# Patient Record
Sex: Male | Born: 1965 | Race: Asian | Hispanic: No | Marital: Single | Smoking: Never smoker
Health system: Southern US, Community
[De-identification: ages and names within clinical notes are randomized; demographics above are authoritative.]

## PROBLEM LIST (undated history)

## (undated) ENCOUNTER — Emergency Department (HOSPITAL_COMMUNITY): Payer: Self-pay

## (undated) DIAGNOSIS — I10 Essential (primary) hypertension: Secondary | ICD-10-CM

## (undated) DIAGNOSIS — E119 Type 2 diabetes mellitus without complications: Secondary | ICD-10-CM

---

## 2001-03-16 ENCOUNTER — Encounter: Admission: RE | Admit: 2001-03-16 | Discharge: 2001-03-16 | Payer: Self-pay | Admitting: Sports Medicine

## 2001-04-06 ENCOUNTER — Encounter: Admission: RE | Admit: 2001-04-06 | Discharge: 2001-04-06 | Payer: Self-pay | Admitting: Sports Medicine

## 2001-05-05 ENCOUNTER — Encounter: Admission: RE | Admit: 2001-05-05 | Discharge: 2001-05-05 | Payer: Self-pay | Admitting: Family Medicine

## 2001-05-28 ENCOUNTER — Encounter: Admission: RE | Admit: 2001-05-28 | Discharge: 2001-05-28 | Payer: Self-pay | Admitting: Family Medicine

## 2001-06-01 ENCOUNTER — Emergency Department (HOSPITAL_COMMUNITY): Admission: EM | Admit: 2001-06-01 | Discharge: 2001-06-01 | Payer: Self-pay | Admitting: Emergency Medicine

## 2001-06-11 ENCOUNTER — Encounter: Admission: RE | Admit: 2001-06-11 | Discharge: 2001-06-11 | Payer: Self-pay | Admitting: Family Medicine

## 2014-06-25 ENCOUNTER — Encounter (HOSPITAL_COMMUNITY): Payer: Self-pay | Admitting: Emergency Medicine

## 2014-06-25 ENCOUNTER — Emergency Department (HOSPITAL_COMMUNITY): Payer: No Typology Code available for payment source

## 2014-06-25 ENCOUNTER — Emergency Department (HOSPITAL_COMMUNITY)
Admission: EM | Admit: 2014-06-25 | Discharge: 2014-06-25 | Disposition: A | Discharge status: Home / Self Care | Payer: No Typology Code available for payment source | Attending: Emergency Medicine | Admitting: Emergency Medicine

## 2014-06-25 DIAGNOSIS — S0083XA Contusion of other part of head, initial encounter: Secondary | ICD-10-CM | POA: Insufficient documentation

## 2014-06-25 DIAGNOSIS — S00511A Abrasion of lip, initial encounter: Secondary | ICD-10-CM | POA: Diagnosis not present

## 2014-06-25 DIAGNOSIS — Y9389 Activity, other specified: Secondary | ICD-10-CM | POA: Diagnosis not present

## 2014-06-25 DIAGNOSIS — S0990XA Unspecified injury of head, initial encounter: Secondary | ICD-10-CM | POA: Diagnosis present

## 2014-06-25 DIAGNOSIS — Y92410 Unspecified street and highway as the place of occurrence of the external cause: Secondary | ICD-10-CM | POA: Insufficient documentation

## 2014-06-25 DIAGNOSIS — R0789 Other chest pain: Secondary | ICD-10-CM

## 2014-06-25 DIAGNOSIS — S0093XA Contusion of unspecified part of head, initial encounter: Secondary | ICD-10-CM

## 2014-06-25 MED ORDER — ACETAMINOPHEN 500 MG PO TABS
1000.0000 mg | ORAL_TABLET | Freq: Once | ORAL | Status: AC
  Administered 2014-06-25: 1000 mg via ORAL
  Filled 2014-06-25: qty 2
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
## 2014-06-25 NOTE — Discharge Instructions (Signed)
Return to the emergency room with worsening of symptoms, new symptoms or with symptoms that are concerning, especially severe worsening of headache, visual or speech changes, weakness in face, arms or legs. RICE: Rest, Ice (three cycles of 20 mins on, off at least twice a day), compression/brace, elevation. Heating pad works well for back pain. Ibuprofen 400mg  (2 tablets 200mg ) every 5-6 hours for 3-5 days and then as needed for pain. Follow up with PCP. Please call your doctor for a followup appointment within 24-48 hours. When you talk to your doctor please let them know that you were seen in the emergency department and have them acquire all of your records so that they can discuss the findings with you and formulate a treatment plan to fully care for your new and ongoing problems. If you do not have a primary care provider please call the number below under ED resources to establish care with a provider and follow up.   Emergency Department Resource Guide 1) Find a Doctor and Pay Out of Pocket Although you won't have to find out who is covered by your insurance plan, it is a good idea to ask around and get recommendations. You will then need to call the office and see if the doctor you have chosen will accept you as a new patient and what types of options they offer for patients who are self-pay. Some doctors offer discounts or will set up payment plans for their patients who do not have insurance, but you will need to ask so you aren't surprised when you get to your appointment.  2) Contact Your Local Health Department Not all health departments have doctors that can see patients for sick visits, but many do, so it is worth a call to see if yours does. If you don't know where your local health department is, you can check in your phone book. The CDC also has a tool to help you locate your state's health department, and many state websites also have listings of all of their local health  departments.  3) Find a Walk-in Clinic If your illness is not likely to be very severe or complicated, you may want to try a walk in clinic. These are popping up all over the country in pharmacies, drugstores, and shopping centers. They're usually staffed by nurse practitioners or physician assistants that have been trained to treat common illnesses and complaints. They're usually fairly quick and inexpensive. However, if you have serious medical issues or chronic medical problems, these are probably not your best option.  No Primary Care Doctor: - Call Health Connect at  [phone] - they can help you locate a primary care doctor that  accepts your insurance, provides certain services, etc. - Physician Referral Service- [phone]  Chronic Pain Problems: Organization         Address  Phone   Notes  [phone] [facility]  [phone] Patients need to be referred by their primary care doctor.   Medication Assistance: Organization         Address  Phone   Notes  [facility] Medication [facility] [address] [place] [address] [place], [place] [phone] --Must be a resident of [facility] -- Must have NO insurance coverage whatsoever (no Medicaid/ Medicare, etc.) -- The pt. MUST have a primary care doctor that directs their care regularly and follows them in the community   MedAssist  [phone]   [facility] [address]  ([phone]  Agencies that provide inexpensive medical care: °Organization         Address  Phone   Notes  °[facility]  (336) [phone]   °[facility]    (336) 832-7272   °[facility] [address] °[place], [place] [address] (336) 832-4777   °[facility] [address], °[place] (336) 271-4999   °[facility]    (336) 373-0678   °[facility]    (336) 272-1050   °[facility] ° [address], [place] Phone:  (336)  [phone], Fax:  (336) [phone] Hours of Operation:  9 am - 6 pm, M-F.  Also accepts Medicaid/Medicare and self-pay.  °[facility] ° [address], [place] Phone: (336) [phone], Fax: (336) [phone]. Hours of Operation:  8:30 am - 5:30 pm, M-F.  Also accepts Medicaid and self-pay.  °[facility] [place] [address], [place] Phone: (336) 878-6027   °[facility] [address], [place], [place] (336)723-1848, Ext. 123 Mondays & Thursdays: 7-9 AM.  First 15 patients are seen on a first come, first serve basis. °  ° °Medicaid-accepting [place] Providers: ° °Organization         Address  Phone   Notes  °[facility] [address], [place] ([phone]) [phone] Also accepts self-pay patients.  °[facility] [address], [place] ° (336) [phone]   °[facility] [address], [place] (336) 288-8857   °[facility] [address] [place] [address], [place] (336) 299-7000   °Veita Bland [address], [place]  ° (336) [phone] Only accepts [place] Access Medicaid patients after they have their name applied to their card.  ° °Self-Pay (no insurance) in [place]: ° °Organization         Address  Phone   Notes  °Sickle Cell Patients, [facility] [address], [place] (336) 832-1970   °[facility] [address], [place] (336) 832-4400   °[facility] ° [address], [place] (336) 992-4800   °[facility]/Dr. Osei-Bonsu ° [address] [place] [address], [place] or [address], [place] (336) [phone] Phone number for both [place] and [place] locations is the same.  °[facility] [address], [place] (336) 299-0000   °[facility] [address] [place] [address], [place] or [address] (336)  852-7530 °(336) 878-2260   °[facility] [address], [place] (336) 350-1642, phone; (336) 294-5005, fax Sees patients 1st and 3rd Saturday of every month.  Must not qualify for public or private insurance (i.e. Medicaid, Medicare, [place] Health Choice, Veterans' Benefits) • Household income should be no more than 200% of the poverty level •The clinic cannot treat you if you are pregnant or think you are pregnant • Sexually transmitted diseases are not treated at the clinic.  ° ° °Dental Care: °Organization         Address  Phone  Notes  °[place] [facility] [address], [place] (336) 641-6152 Accepts children up to age 21 who are enrolled in Medicaid or [place] Health Choice; pregnant women with a Medicaid card; and children who have applied for Medicaid or   [place] Health Choice, but were declined, whose parents can pay a reduced fee at time of service.  °[place] [facility] [place]  [address], [place] (336) 641-7733 Accepts children up to age 21 who are enrolled in Medicaid or [place] Health Choice; pregnant women with a Medicaid card; and children who have applied for Medicaid or [place] Health Choice, but were declined, whose parents can pay a reduced fee at time of service.  °[facility] ° [address], [place] (336) 641-4533 Patients are seen by appointment only. Walk-ins are not accepted. [facility] will see patients 18 years of age and older. °Monday - Tuesday (8am-5pm) °Most Wednesdays (8:30-5pm) °$30 per visit, cash only  °[facility] ° [address], [place] (336) 641-4533 Patients are seen by appointment only. Walk-ins are not accepted. [facility] will see patients 18 years of age and older. °One Wednesday Evening (Monthly: Volunteer Based).  $30 per visit, cash only  °[facility]  (919) [phone] for adults;  Children under age 4, call Graduate Pediatric Dentistry at ([phone]) [phone]. Children aged 4-14, please call ([phone]) [phone] to request a pediatric application. ° Dental services are provided in all areas of dental care including fillings, crowns and bridges, complete and partial dentures, implants, gum treatment, root canals, and extractions. Preventive care is also provided. Treatment is provided to both adults and children. °Patients are selected via a lottery and there is often a waiting list. °  °[facility] [address], °[place] ° (336) 763-8833 [email or web address] °  °[facility] [address], [place], [place] (336)723-1848, Ext. 123 Second and Fourth Thursday of each month, opens at 6:30 AM; Clinic ends at 9 AM.  Patients are seen on a first-come first-served basis, and a limited number are seen during each clinic.  ° °[facility] ° [address], [place], [place] (336) 723-7904   Eligibility Requirements °You must have lived in [place], [place], or [place] counties for at least the last three months. °  You cannot be eligible for state or federal sponsored healthcare insurance, including Veterans Administration, Medicaid, or Medicare. °  You generally cannot be eligible for healthcare insurance through your employer.  °  How to apply: °Eligibility screenings are held every Tuesday and Wednesday afternoon from 1:00 pm until 4:00 pm. You do not need an appointment for the interview!  °[facility] [address], [place], [place] 336-631-2330   °[facility]  336-342-8273   °[facility]  336-703-3100   °[facility]  336-570-6415   ° °Behavioral Health Resources in the Community: °Intensive Outpatient Programs °Organization         Address  Phone  Notes  °[facility] [address], [place], [place] 336-878-6098   °[facility] [address], [place], [place] 336-832-9800   °[facility] [address], [place], [place] ° 336-882-2125   °[place] [address],  °[place], [place] 1-800-853-5163 or 336-641-4981   °Substance Abuse Resources °Organization         Address  Phone  Notes  °[facility]  336-882-2125   °[facility]  336-784-9470   °[facility]  336-285-9073   °[facility]  336-845-3988   °Residential & Outpatient Substance Abuse Program  1-800-659-3381   °  Psychological Services Organization         Address  Phone  Notes  [facility]  206-450-5763   [facility]  303-686-9053   [facility] 819-482-0886 [address] [place] 386-545-6930 or (217)634-3649    Mobile Crisis Teams Organization         Address  Phone  Notes  [facility], Mobile Crisis Care Unit  409-634-6339   [facility]  [address] [place], [place] 301-314-3888   Doristine Locks [address] [place] 757-972-8206    Self-Help/Support Groups Organization         Address  Phone             Notes  [facility]oc. of  - variety of support groups  336- I7437963 Call for more information  Narcotics Anonymous (NA), [facility] [address], [employer] [place]  2 meetings at this location   Statistician         Address  Phone  Notes  [facility] [address],    [place]  0-156-153-7943   [facility]  [address], [place] [address], [place], [place] [phone]   [facility] [address] [place], [place] 037-096-4383 Admissions: 8am-3pm M-F  [facility] [address],    [place], [place] 818-403-7543   [facility] [address] [place], [place], [place] 606-770-3403   [facility] [address],  [place], [place] 524-818-5909   [facility] - Intensive  Outpatient [address], [place], [place] 311-216-2446   [facility] ([facility] [address]  [place], [place] 9-507-225-7505 or 970-002-3885   [facility] ([facility] [address], [place], [place] 984-210-3128 Accepts Medicaid  [facility] [address]  [place] 1-188-677-3736 Substance Abuse/Addiction Treatment   [facility] Organization         Address  Phone  Notes  [facility]  (671)713-2782   Angie Fava, PhD [address] [place], [place]   727-490-0570 or 343-375-0553   [facility] Behavioral   [address] [place], [place] 707-297-5804   [facility] [address] [place], [place] [phone] Insurance/Medicaid/sponsorship through [facility] [address]                                    [place], [place] (563)[phone] Therapy/tele-psych/case  [facility] [address], [place] (343)171-6307    Dr. Lolly Mustache  229-093-1630   [facility]  [employer] [facility] Dept. 1) [address], [place] 2) [address] [place] 3)  [address], [place] [phone]  (249) [phone]   [facility] Child Abuse Hotline (431)748-4325 or (818)029-9579 (After Hours)
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
## 2014-06-27 NOTE — ED Provider Notes (Signed)
Medical screening examination/treatment/procedure(s) were performed by non-physician practitioner and as supervising physician I was immediately available for consultation/collaboration.   EKG Interpretation None        Gwyneth Sprout, MD 06/27/14 2011

## 2014-07-03 ENCOUNTER — Other Ambulatory Visit: Payer: Self-pay

## 2014-07-03 ENCOUNTER — Encounter (HOSPITAL_COMMUNITY): Payer: Self-pay | Admitting: Emergency Medicine

## 2014-07-03 ENCOUNTER — Emergency Department (HOSPITAL_COMMUNITY)
Admission: EM | Admit: 2014-07-03 | Discharge: 2014-07-03 | Disposition: A | Discharge status: Home / Self Care | Payer: No Typology Code available for payment source | Attending: Emergency Medicine | Admitting: Emergency Medicine

## 2014-07-03 ENCOUNTER — Emergency Department (HOSPITAL_COMMUNITY): Payer: No Typology Code available for payment source

## 2014-07-03 DIAGNOSIS — S0990XA Unspecified injury of head, initial encounter: Secondary | ICD-10-CM | POA: Diagnosis present

## 2014-07-03 DIAGNOSIS — S0083XA Contusion of other part of head, initial encounter: Secondary | ICD-10-CM | POA: Insufficient documentation

## 2014-07-03 DIAGNOSIS — Y9389 Activity, other specified: Secondary | ICD-10-CM | POA: Insufficient documentation

## 2014-07-03 DIAGNOSIS — R42 Dizziness and giddiness: Secondary | ICD-10-CM | POA: Insufficient documentation

## 2014-07-03 DIAGNOSIS — Y92410 Unspecified street and highway as the place of occurrence of the external cause: Secondary | ICD-10-CM | POA: Diagnosis not present

## 2014-07-03 DIAGNOSIS — R519 Headache, unspecified: Secondary | ICD-10-CM

## 2014-07-03 DIAGNOSIS — R51 Headache: Secondary | ICD-10-CM

## 2014-07-03 LAB — CBC WITH DIFFERENTIAL/PLATELET
BASOS ABS: 0 10*3/uL (ref 0.0–0.1)
Basophils Relative: 0 % (ref 0–1)
EOS PCT: 3 % (ref 0–5)
Eosinophils Absolute: 0.3 10*3/uL (ref 0.0–0.7)
HCT: 41.5 % (ref 39.0–52.0)
Hemoglobin: 14.1 g/dL (ref 13.0–17.0)
Lymphocytes Relative: 39 % (ref 12–46)
Lymphs Abs: 3 10*3/uL (ref 0.7–4.0)
MCH: 25.6 pg — AB (ref 26.0–34.0)
MCHC: 34 g/dL (ref 30.0–36.0)
MCV: 75.3 fL — AB (ref 78.0–100.0)
MONO ABS: 0.8 10*3/uL (ref 0.1–1.0)
Monocytes Relative: 11 % (ref 3–12)
Neutro Abs: 3.5 10*3/uL (ref 1.7–7.7)
Neutrophils Relative %: 47 % (ref 43–77)
Platelets: 237 10*3/uL (ref 150–400)
RBC: 5.51 MIL/uL (ref 4.22–5.81)
RDW: 12.9 % (ref 11.5–15.5)
WBC: 7.6 10*3/uL (ref 4.0–10.5)

## 2014-07-03 LAB — BASIC METABOLIC PANEL
ANION GAP: 14 (ref 5–15)
BUN: 10 mg/dL (ref 6–23)
CO2: 26 mEq/L (ref 19–32)
CREATININE: 0.82 mg/dL (ref 0.50–1.35)
Calcium: 9.4 mg/dL (ref 8.4–10.5)
Chloride: 100 mEq/L (ref 96–112)
GFR calc Af Amer: >90 mL/min (ref >90)
Glucose, Bld: 92 mg/dL (ref 70–99)
Potassium: 4 mEq/L (ref 3.7–5.3)
SODIUM: 140 meq/L (ref 137–147)

## 2014-07-03 LAB — I-STAT TROPONIN, ED: TROPONIN I, POC: 0 ng/mL (ref 0.00–0.08)

## 2014-07-03 MED ORDER — HYDROCODONE-ACETAMINOPHEN 5-325 MG PO TABS: 1.0000 | ORAL_TABLET | ORAL | Status: DC | PRN

## 2014-07-03 MED ORDER — MECLIZINE HCL 25 MG PO TABS: 25.0000 mg | ORAL_TABLET | Freq: Three times a day (TID) | ORAL | Status: DC | PRN

## 2014-07-03 MED ORDER — MECLIZINE HCL 25 MG PO TABS
25.0000 mg | ORAL_TABLET | Freq: Once | ORAL | Status: AC
  Administered 2014-07-03: 25 mg via ORAL
  Filled 2014-07-03: qty 1

## 2014-07-03 MED ORDER — MECLIZINE HCL 50 MG PO TABS: 50.0000 mg | ORAL_TABLET | Freq: Three times a day (TID) | ORAL | Status: DC | PRN
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
## 2014-07-03 NOTE — Discharge Instructions (Signed)
Take the prescribed medication as directed. °[facility]. °Return to the ED for new or worsening symptoms. ° °

## 2014-07-03 NOTE — ED Provider Notes (Signed)
Medical screening examination/treatment/procedure(s) were performed by non-physician practitioner and as supervising physician I was immediately available for consultation/collaboration.   EKG Interpretation None       Arby Barrette, MD 07/03/14 [phone]

## 2020-03-15 ENCOUNTER — Ambulatory Visit: Payer: Self-pay | Attending: Internal Medicine

## 2020-03-15 DIAGNOSIS — Z23 Encounter for immunization: Secondary | ICD-10-CM

## 2020-03-15 NOTE — Progress Notes (Signed)
   Covid-19 Vaccination Clinic  Name:  Wayne Stokes    MRN: [id number] DOB: 03-Apr-1966  03/15/2020  [name] was observed post Covid-19 immunization for 15 minutes without incident. He was provided with Vaccine Information Sheet and instruction to access the V-Safe system.   [name] was instructed to call 911 with any severe reactions post vaccine: Marland Kitchen Difficulty breathing  . Swelling of face and throat  . A fast heartbeat  . A bad rash all over body  . Dizziness and weakness   Immunizations Administered    Name Date Dose VIS Date Route   Pfizer COVID-19 Vaccine 03/15/2020 10:45 AM 0.3 mL 11/02/2018 Intramuscular   Manufacturer: [employer], [employer]   Lot: [id number]   NDC: [address]-6

## 2020-04-07 ENCOUNTER — Other Ambulatory Visit: Payer: Self-pay

## 2020-04-07 ENCOUNTER — Ambulatory Visit: Payer: Self-pay | Attending: Internal Medicine

## 2020-04-07 DIAGNOSIS — Z23 Encounter for immunization: Secondary | ICD-10-CM

## 2020-04-07 NOTE — Progress Notes (Signed)
   Covid-19 Vaccination Clinic  Name:  Wayne Stokes    MRN: [id number] DOB: 06-Jun-1966  04/07/2020  [name] was observed post Covid-19 immunization for 30 minutes based on pre-vaccination screening without incident. He was provided with Vaccine Information Sheet and instruction to access the V-Safe system.   [name] was instructed to call 911 with any severe reactions post vaccine: Marland Kitchen Difficulty breathing  . Swelling of face and throat  . A fast heartbeat  . A bad rash all over body  . Dizziness and weakness   Immunizations Administered    Name Date Dose VIS Date Route   Pfizer COVID-19 Vaccine 04/07/2020  9:23 AM 0.3 mL 11/02/2018 Intramuscular   Manufacturer: [employer], [employer]   Lot: [id number]   NDC: [address]-2

## 2021-01-07 ENCOUNTER — Other Ambulatory Visit: Payer: Self-pay | Admitting: Physical Medicine and Rehabilitation

## 2021-01-07 DIAGNOSIS — S39012A Strain of muscle, fascia and tendon of lower back, initial encounter: Secondary | ICD-10-CM

## 2021-01-10 ENCOUNTER — Other Ambulatory Visit: Payer: Self-pay

## 2021-03-06 NOTE — Congregational Nurse Program (Signed)
CN office visit.  He was brought by a friend with whom he is currently living.  Patient has been unemployed with no income or benefits for about 6 months.  Immediate request was for food assistance.  Patient was referred to [place] for emergency assistance and was helped to complete a FNS application.  CN accompanied them to food pantry to assure urgent need was met.  [name] RN, Congregational Nurse [phone]

## 2021-05-22 NOTE — Congregational Nurse Program (Signed)
CN office visit with interpreter Diu Hartshorn assisting.  Assisted patient in applying for Medicaid and referred him to [name] ([employer]) for help with Disability denial.  They will refer him to Legal Aid.  Told him to return to CN office when he gets letter from DSS regarding Medicaid application so we can help him follow through with their requests.  Brantley Fling RN, Congregational Nurse [phone]

## 2021-06-19 NOTE — Congregational Nurse Program (Signed)
CN office visit with interpreter Diu Hartshorn assisting.  Patient received letter from DSS stating his Medicaid application had been withdrawn.  Will pursue [employer].  [employer] Intern Nia Jones helped patient with phone bill and was able to lower bill significantly.  Brantley Fling RN, Congregational Nurse [phone]

## 2021-06-26 NOTE — Congregational Nurse Program (Signed)
CN office visit with interpreter Diu Hartshorn assisting.  Completed Orange card application and will obtain needed documents next week.  [employer] intern Nia Jones helped him with phone bill and establishing new affordable plan.  CN gave patient a flu shot.  Brantley Fling RN, Congregational Nurse [phone]

## 2021-07-10 NOTE — Congregational Nurse Program (Signed)
Home visit with interpreter Diu Hartshorn to help patient obtain remaining documents to apply for orange card.  We met him at his bank and got most 3 most recent bank statements and got a copy of his 2021 tax return.  He was told he needs a copy of his food stamp award letter and a notarized letter of support from the person who is providing him a place to live.  Will revisit next week.  [name] RN, Congregational Nurse [phone]

## 2021-07-24 NOTE — Congregational Nurse Program (Signed)
Home visit with interpreter Diu Hartshorn.  Patient was sitting outside the apartment where he has been staying with a [place] couple for 8 months.  Stated they no longer want him there.  He has no income for rent.  Phone call to Horatio Pel at [facility] who will assist patient with emergency housing.  He stated he had difficulty walking yesterday and is using a cane. He did not go to doctor because he has no money and is uninsured.  Explained that he could go to [facility] even though he could not pay.  Gave him address and hours and told him we will help with applying for financial assistance once he receives a bill.  Brantley Fling RN, Congregational Nurse [phone]

## 2021-07-29 NOTE — Congregational Nurse Program (Signed)
Home visit without interpreter. Due to language barrier I asked him to come to my office at [employer] on Wednesday. I did demonstrate 2 ways patient can navigate steps in the apartment where he was placed temporarily by [name].  He is using a cane and says both legs have problems.  Will get more information with interpreter present and plan to refer to [facility] clinic for evaluation.  Brantley Fling RN, Congregational Nurse [phone].

## 2021-11-05 ENCOUNTER — Ambulatory Visit: Payer: Self-pay | Attending: Family Medicine | Admitting: Family Medicine

## 2021-11-05 ENCOUNTER — Other Ambulatory Visit: Payer: Self-pay

## 2021-11-12 ENCOUNTER — Ambulatory Visit: Payer: Self-pay | Admitting: Nurse Practitioner

## 2021-11-12 ENCOUNTER — Other Ambulatory Visit: Payer: Self-pay

## 2021-11-15 ENCOUNTER — Other Ambulatory Visit: Payer: Self-pay

## 2021-11-15 ENCOUNTER — Ambulatory Visit (INDEPENDENT_AMBULATORY_CARE_PROVIDER_SITE_OTHER): Payer: Self-pay | Admitting: Nurse Practitioner

## 2021-11-15 ENCOUNTER — Encounter: Payer: Self-pay | Admitting: Nurse Practitioner

## 2021-11-15 VITALS — BP 176/124 | HR 82 | Temp 97.3°F | Ht 60.0 in | Wt 135.2 lb

## 2021-11-15 DIAGNOSIS — Z0000 Encounter for general adult medical examination without abnormal findings: Secondary | ICD-10-CM

## 2021-11-15 DIAGNOSIS — Z789 Other specified health status: Secondary | ICD-10-CM

## 2021-11-15 DIAGNOSIS — Z599 Problem related to housing and economic circumstances, unspecified: Secondary | ICD-10-CM

## 2021-11-15 DIAGNOSIS — E7849 Other hyperlipidemia: Secondary | ICD-10-CM

## 2021-11-15 DIAGNOSIS — G4489 Other headache syndrome: Secondary | ICD-10-CM

## 2021-11-15 DIAGNOSIS — E119 Type 2 diabetes mellitus without complications: Secondary | ICD-10-CM

## 2021-11-15 DIAGNOSIS — M2559 Pain in other specified joint: Secondary | ICD-10-CM

## 2021-11-15 DIAGNOSIS — I10 Essential (primary) hypertension: Secondary | ICD-10-CM

## 2021-11-15 DIAGNOSIS — R413 Other amnesia: Secondary | ICD-10-CM

## 2021-11-15 LAB — POCT URINALYSIS DIP (CLINITEK)
Blood, UA: NEGATIVE
Glucose, UA: NEGATIVE mg/dL
Ketones, POC UA: NEGATIVE mg/dL
Leukocytes, UA: NEGATIVE
Nitrite, UA: NEGATIVE
POC PROTEIN,UA: 30 — AB
Spec Grav, UA: >=1.03 — AB (ref 1.010–1.025)
Urobilinogen, UA: 0.2 E.U./dL
pH, UA: 5 (ref 5.0–8.0)

## 2021-11-15 LAB — POCT GLYCOSYLATED HEMOGLOBIN (HGB A1C)
HbA1c POC (<> result, manual entry): 9.3 % (ref 4.0–5.6)
HbA1c, POC (controlled diabetic range): 9.3 % — AB (ref 0.0–7.0)
HbA1c, POC (prediabetic range): 9.3 % — AB (ref 5.7–6.4)
Hemoglobin A1C: 9.3 % — AB (ref 4.0–5.6)

## 2021-11-15 LAB — GLUCOSE, POCT (MANUAL RESULT ENTRY): POC Glucose: 205 mg/dl — AB (ref 70–99)

## 2021-11-15 MED ORDER — DICLOFENAC SODIUM 1 % EX GEL
4.0000 g | Freq: Four times a day (QID) | CUTANEOUS | 0 refills | Status: AC | PRN
  Filled 2021-11-15: qty 300, 18d supply, fill #0

## 2021-11-15 MED ORDER — LOSARTAN POTASSIUM-HCTZ 50-12.5 MG PO TABS: 1.0000 | ORAL_TABLET | Freq: Every day | ORAL | 2 refills | Status: DC

## 2021-11-15 MED ORDER — ACETAMINOPHEN ER 650 MG PO TBCR
650.0000 mg | EXTENDED_RELEASE_TABLET | Freq: Three times a day (TID) | ORAL | 0 refills | Status: DC | PRN
  Filled 2021-11-15: qty 30, 10d supply, fill #0

## 2021-11-15 MED ORDER — GLIPIZIDE 5 MG PO TABS
5.0000 mg | ORAL_TABLET | Freq: Two times a day (BID) | ORAL | 3 refills | Status: DC
  Filled 2021-11-15: qty 60, 30d supply, fill #0
  Filled 2021-12-25: qty 60, 30d supply, fill #1
  Filled 2022-02-12: qty 60, 30d supply, fill #2
  Filled 2022-03-13 – 2022-03-14 (×2): qty 60, 30d supply, fill #3

## 2021-11-15 MED ORDER — LOSARTAN POTASSIUM-HCTZ 50-12.5 MG PO TABS
1.0000 | ORAL_TABLET | Freq: Every day | ORAL | 2 refills | Status: DC
  Filled 2021-11-15: qty 90, 90d supply, fill #0

## 2021-11-15 MED ORDER — METFORMIN HCL 500 MG PO TABS
500.0000 mg | ORAL_TABLET | Freq: Two times a day (BID) | ORAL | 3 refills | Status: AC
  Filled 2021-11-15: qty 180, 90d supply, fill #0
  Filled 2022-03-13 – 2022-03-14 (×2): qty 60, 30d supply, fill #1
  Filled 2022-05-15: qty 60, 30d supply, fill #2

## 2021-11-15 MED ORDER — METFORMIN HCL 500 MG PO TABS: 500.0000 mg | ORAL_TABLET | Freq: Two times a day (BID) | ORAL | 3 refills | Status: DC

## 2021-11-15 MED ORDER — GLIPIZIDE 5 MG PO TABS: 5.0000 mg | ORAL_TABLET | Freq: Two times a day (BID) | ORAL | 3 refills | Status: DC

## 2021-11-15 NOTE — Patient Instructions (Signed)
You were seen today in the [facility] for wellness visit . Labs were collected, results will be available via MyChart or, if abnormal, you will be contacted by clinic staff. You were prescribed medications, please take as directed. Please follow up in 1 mth for reevaluation.  ?

## 2021-11-15 NOTE — Progress Notes (Signed)
[place] [place], [place]  [address] Phone:  [phone]   Fax:  [phone] Subjective:   Patient ID: Wayne Stokes, male    DOB: Aug 26, 1966, 56 y.o.   MRN: [id number]  Chief Complaint  Patient presents with   Establish Care    Patient is here today to establish care and to discuss his body pains and stiffness that has been going on for 2 years now.   HPI Wayne Stokes 56 y.o. male with no significant medical history to the [address] to establish care. Refugee from [place], that has been in the [place] since 2002. Last visit with PCP unknown. 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   States that he has had worsening body pain and stiffness for the past year. Also endorses HA x 2 days. Expresses having generalized body pains x 1 yr, most pronounced in the right buttocks. Has been taking OTC medications for pain with moderate pain relief, states that he can not miss a day of medication.  HA located in the frontal portion of head, has been rubbing forehead with lemon with marked improvement in HA. Denies any pain during visit. Denies eating meat, states that the meat causes facial swelling.   Currently struggling to pay rent and caseworker has been working towards keeping him from being homeless. Caseworker also endorses patient having difficulty with memory. Denies any other concerns today.   Denies any fever. Denies any fatigue, chest pain, shortness of breath or dizziness. Denies any blurred vision, numbness or tingling.  Limited HPI/ ROS due to language barrier.  History reviewed. No pertinent past medical history.  History reviewed. No pertinent surgical history.  Family History  Family history unknown: Yes    Social History   Socioeconomic History   Marital status: Single    Spouse name: Not on file   Number of children: Not on file   Years of education: Not on file   Highest education level: Not on file  Occupational History   Not on file   Tobacco Use   Smoking status: Never   Smokeless tobacco: Never  Vaping Use   Vaping Use: Never used  Substance and Sexual Activity   Alcohol use: Never   Drug use: Never   Sexual activity: Not Currently  Other Topics Concern   Not on file  Social History Narrative   Not on file   Social Determinants of Health   Financial Resource Strain: Not on file  Food Insecurity: Not on file  Transportation Needs: Not on file  Physical Activity: Not on file  Stress: Not on file  Social Connections: Not on file  Intimate Partner Violence: Not on file    Outpatient Medications Prior to Visit  Medication Sig Dispense Refill   meclizine (ANTIVERT) 25 MG tablet Take 1 tablet (25 mg total) by mouth 3 (three) times daily as needed. (Patient not taking: Reported on 11/15/2021) 30 tablet 0   HYDROcodone-acetaminophen (NORCO/VICODIN) 5-325 MG per tablet Take 1 tablet by mouth every 4 (four) hours as needed. (Patient not taking: Reported on 11/15/2021) 10 tablet 0   No facility-administered medications prior to visit.    Allergies  Allergen Reactions   Beef-Derived Products Swelling   Pork-Derived Products Swelling   Shellfish Allergy Rash    Review of Systems  Constitutional:  Negative for chills, fever and malaise/fatigue.  HENT: Negative.    Eyes: Negative.   Respiratory:  Negative for cough and shortness of breath.  Cardiovascular:  Negative for chest pain, palpitations and leg swelling.  Gastrointestinal:  Negative for abdominal pain, blood in stool, constipation, diarrhea, nausea and vomiting.  Genitourinary: Negative.   Musculoskeletal:  Positive for joint pain and myalgias.  Skin: Negative.   Neurological:  Positive for headaches. Negative for dizziness, tingling, tremors, sensory change, speech change, focal weakness, seizures, loss of consciousness and weakness.  Psychiatric/Behavioral:  Positive for memory loss. Negative for depression, hallucinations, substance abuse and  suicidal ideas. The patient is not nervous/anxious and does not have insomnia.   All other systems reviewed and are negative.     Objective:    Physical Exam Vitals reviewed.  Constitutional:      General: He is not in acute distress.    Appearance: Normal appearance. He is normal weight.     Comments: Patient using a cane to assist with ambulation   HENT:     Head: Normocephalic.     Right Ear: Tympanic membrane, ear canal and external ear normal. There is no impacted cerumen.     Left Ear: Tympanic membrane, ear canal and external ear normal. There is no impacted cerumen.     Nose: Nose normal. No congestion or rhinorrhea.     Mouth/Throat:     Mouth: Mucous membranes are moist.     Pharynx: Oropharynx is clear. No oropharyngeal exudate or posterior oropharyngeal erythema.  Eyes:     General: No scleral icterus.       Right eye: No discharge.        Left eye: No discharge.     Extraocular Movements: Extraocular movements intact.     Conjunctiva/sclera: Conjunctivae normal.     Pupils: Pupils are equal, round, and reactive to light.  Neck:     Vascular: No carotid bruit.  Cardiovascular:     Rate and Rhythm: Normal rate and regular rhythm.     Pulses: Normal pulses.     Heart sounds: Normal heart sounds.     Comments: No obvious peripheral edema Pulmonary:     Effort: Pulmonary effort is normal.     Breath sounds: Normal breath sounds.  Abdominal:     General: Abdomen is flat. Bowel sounds are normal. There is no distension.     Palpations: Abdomen is soft. There is no mass.     Tenderness: There is no abdominal tenderness. There is no right CVA tenderness, left CVA tenderness, guarding or rebound.     Hernia: No hernia is present.  Musculoskeletal:        General: No swelling, tenderness, deformity or signs of injury. Normal range of motion.     Cervical back: Normal range of motion and neck supple. No rigidity or tenderness.     Right lower leg: No edema.     Left  lower leg: No edema.  Lymphadenopathy:     Cervical: No cervical adenopathy.  Skin:    General: Skin is warm and dry.     Capillary Refill: Capillary refill takes less than 2 seconds.  Neurological:     General: No focal deficit present.     Mental Status: He is alert and oriented to person, place, and time.  Psychiatric:        Mood and Affect: Mood normal.        Behavior: Behavior normal.        Thought Content: Thought content normal.        Judgment: Judgment normal.    BP (!) 176/124 (BP Location: Left  Arm, Cuff Size: Normal)    Pulse 82    Temp (!) 97.3 F (36.3 C)    Ht 5' (1.524 m)    Wt 135 lb 3.2 oz (61.3 kg)    SpO2 98%    BMI 26.40 kg/m  Wt Readings from Last 3 Encounters:  11/15/21 135 lb 3.2 oz (61.3 kg)    Immunization History  Administered Date(s) Administered   Influenza,inj,Quad PF,6+ Mos 06/26/2021   PFIZER(Purple Top)SARS-COV-2 Vaccination 03/15/2020, 04/07/2020    Diabetic Foot Exam - Simple   No data filed     No results found for: TSH Lab Results  Component Value Date   WBC 8.3 11/15/2021   HGB 12.9 (L) 11/15/2021   HCT 40.2 11/15/2021   MCV 77 (L) 11/15/2021   PLT 256 11/15/2021   Lab Results  Component Value Date   NA 132 (L) 11/15/2021   K 5.2 11/15/2021   CO2 22 11/15/2021   GLUCOSE 182 (H) 11/15/2021   BUN 12 11/15/2021   CREATININE 1.21 11/15/2021   BILITOT 0.3 11/15/2021   ALKPHOS 92 11/15/2021   AST 25 11/15/2021   ALT 24 11/15/2021   PROT 6.6 11/15/2021   ALBUMIN 4.0 11/15/2021   CALCIUM 9.1 11/15/2021   ANIONGAP 14 07/03/2014   EGFR 70 11/15/2021   Lab Results  Component Value Date   CHOL 235 (H) 11/15/2021   Lab Results  Component Value Date   HDL 55 11/15/2021   Lab Results  Component Value Date   LDLCALC 154 (H) 11/15/2021   Lab Results  Component Value Date   TRIG 144 11/15/2021   Lab Results  Component Value Date   CHOLHDL 4.3 11/15/2021   Lab Results  Component Value Date   HGBA1C 9.3 (A)  11/15/2021   HGBA1C 9.3 11/15/2021   HGBA1C 9.3 (A) 11/15/2021   HGBA1C 9.3 (A) 11/15/2021       Assessment & Plan:   Problem List Items Addressed This Visit   None Visit Diagnoses     Healthcare maintenance    -  Primary   Relevant Orders   POCT URINALYSIS DIP (CLINITEK) (Completed)   HgB A1c (Completed)   Glucose (CBG) (Completed)   CBC with Differential/Platelet (Completed)   Comprehensive metabolic panel (Completed)   Lipid panel (Completed) Encouraged continued diet and exercise efforts  Encouraged continued compliance with medication     Primary hypertension       Relevant Medications   losartan-hydrochlorothiazide (HYZAAR) 50-12.5 MG tablet   atorvastatin (LIPITOR) 40 MG tablet Educated on cause and overall disease process    Other Relevant Orders   CBC with Differential/Platelet (Completed)   Comprehensive metabolic panel (Completed)   Lipid panel (Completed) Encouraged continued diet and exercise efforts  Encouraged continued compliance with medication  Encouraged to check B/P at home  Given anticipatory guidance     Type 2 diabetes mellitus without complication, without long-term current use of insulin (HCC)       Relevant Medications   glipiZIDE (GLUCOTROL) 5 MG tablet   losartan-hydrochlorothiazide (HYZAAR) 50-12.5 MG tablet   metFORMIN (GLUCOPHAGE) 500 MG tablet   atorvastatin (LIPITOR) 40 MG tablet   Other Relevant Orders   CBC with Differential/Platelet (Completed)   Comprehensive metabolic panel (Completed)   Lipid panel (Completed) Encouraged continued diet and exercise efforts  Encouraged continued compliance with medication  Educated on cause and overall disease process    Memory change       Relevant Orders   Ambulatory referral to  Neurology Referred for evaluation for possible dementia v other causes of memory changes   Pain in other joint       Relevant Medications   diclofenac Sodium (VOLTAREN) 1 % GEL   acetaminophen (TYLENOL 8 HOUR  ARTHRITIS PAIN) 650 MG CR tablet Discussed non pharmacological methods for management of symptoms Informed to take OTC medications as needed    Language barrier       Financial difficulties     Referred to in clinic LCSW for further assistance with access to resources   Other headache syndrome       Relevant Medications   acetaminophen (TYLENOL 8 HOUR ARTHRITIS PAIN) 650 MG CR tablet   Other hyperlipidemia       Relevant Medications   losartan-hydrochlorothiazide (HYZAAR) 50-12.5 MG tablet   atorvastatin (LIPITOR) 40 MG tablet Encouraged continued diet and exercise efforts  Encouraged continued compliance with medication     Follow up in 1 mth for reevaluation of hypertension, DM and other concerns, sooner as needed     I have discontinued Zuri Appleton's HYDROcodone-acetaminophen. I am also having him start on diclofenac Sodium, acetaminophen, and atorvastatin. Additionally, I am having him maintain his meclizine, glipiZIDE, losartan-hydrochlorothiazide, and metFORMIN.  Meds ordered this encounter  Medications   DISCONTD: losartan-hydrochlorothiazide (HYZAAR) 50-12.5 MG tablet    Sig: Take 1 tablet by mouth daily.    Dispense:  30 tablet    Refill:  2   DISCONTD: metFORMIN (GLUCOPHAGE) 500 MG tablet    Sig: Take 1 tablet (500 mg total) by mouth 2 (two) times daily with a meal.    Dispense:  180 tablet    Refill:  3   DISCONTD: glipiZIDE (GLUCOTROL) 5 MG tablet    Sig: Take 1 tablet (5 mg total) by mouth 2 (two) times daily before a meal.    Dispense:  60 tablet    Refill:  3   glipiZIDE (GLUCOTROL) 5 MG tablet    Sig: Take 1 tablet (5 mg total) by mouth 2 (two) times daily before a meal.    Dispense:  60 tablet    Refill:  3   losartan-hydrochlorothiazide (HYZAAR) 50-12.5 MG tablet    Sig: Take 1 tablet by mouth daily.    Dispense:  30 tablet    Refill:  2   metFORMIN (GLUCOPHAGE) 500 MG tablet    Sig: Take 1 tablet (500 mg total) by mouth 2 (two) times daily with a meal.     Dispense:  180 tablet    Refill:  3   diclofenac Sodium (VOLTAREN) 1 % GEL    Sig: Apply 4 g topically 4 (four) times daily as needed for up to 22 days (body pain).    Dispense:  350 g    Refill:  0   acetaminophen (TYLENOL 8 HOUR ARTHRITIS PAIN) 650 MG CR tablet    Sig: Take 1 tablet (650 mg total) by mouth every 8 (eight) hours as needed for pain.    Dispense:  30 tablet    Refill:  0   atorvastatin (LIPITOR) 40 MG tablet    Sig: Take 1 tablet (40 mg total) by mouth daily.    Dispense:  90 tablet    Refill:  3     Teena Dunk, NP

## 2021-11-16 LAB — COMPREHENSIVE METABOLIC PANEL
ALT: 24 IU/L (ref 0–44)
AST: 25 IU/L (ref 0–40)
Albumin/Globulin Ratio: 1.5 (ref 1.2–2.2)
Albumin: 4 g/dL (ref 3.8–4.9)
Alkaline Phosphatase: 92 IU/L (ref 44–121)
BUN/Creatinine Ratio: 10 (ref 9–20)
BUN: 12 mg/dL (ref 6–24)
Bilirubin Total: 0.3 mg/dL (ref 0.0–1.2)
CO2: 22 mmol/L (ref 20–29)
Calcium: 9.1 mg/dL (ref 8.7–10.2)
Chloride: 95 mmol/L — ABNORMAL LOW (ref 96–106)
Creatinine, Ser: 1.21 mg/dL (ref 0.76–1.27)
Globulin, Total: 2.6 g/dL (ref 1.5–4.5)
Glucose: 182 mg/dL — ABNORMAL HIGH (ref 70–99)
Potassium: 5.2 mmol/L (ref 3.5–5.2)
Sodium: 132 mmol/L — ABNORMAL LOW (ref 134–144)
Total Protein: 6.6 g/dL (ref 6.0–8.5)
eGFR: 70 mL/min/{1.73_m2} (ref >59)

## 2021-11-16 LAB — CBC WITH DIFFERENTIAL/PLATELET
Basophils Absolute: 0 10*3/uL (ref 0.0–0.2)
Basos: 0 %
EOS (ABSOLUTE): 0 10*3/uL (ref 0.0–0.4)
Eos: 0 %
Hematocrit: 40.2 % (ref 37.5–51.0)
Hemoglobin: 12.9 g/dL — ABNORMAL LOW (ref 13.0–17.7)
Immature Grans (Abs): 0.1 10*3/uL (ref 0.0–0.1)
Immature Granulocytes: 1 %
Lymphocytes Absolute: 1 10*3/uL (ref 0.7–3.1)
Lymphs: 12 %
MCH: 24.6 pg — ABNORMAL LOW (ref 26.6–33.0)
MCHC: 32.1 g/dL (ref 31.5–35.7)
MCV: 77 fL — ABNORMAL LOW (ref 79–97)
Monocytes Absolute: 0.6 10*3/uL (ref 0.1–0.9)
Monocytes: 8 %
Neutrophils Absolute: 6.5 10*3/uL (ref 1.4–7.0)
Neutrophils: 79 %
Platelets: 256 10*3/uL (ref 150–450)
RBC: 5.25 x10E6/uL (ref 4.14–5.80)
RDW: 15 % (ref 11.6–15.4)
WBC: 8.3 10*3/uL (ref 3.4–10.8)

## 2021-11-16 LAB — LIPID PANEL
Chol/HDL Ratio: 4.3 ratio (ref 0.0–5.0)
Cholesterol, Total: 235 mg/dL — ABNORMAL HIGH (ref 100–199)
HDL: 55 mg/dL (ref >39)
LDL Chol Calc (NIH): 154 mg/dL — ABNORMAL HIGH (ref 0–99)
Triglycerides: 144 mg/dL (ref 0–149)
VLDL Cholesterol Cal: 26 mg/dL (ref 5–40)

## 2021-11-18 ENCOUNTER — Other Ambulatory Visit: Payer: Self-pay

## 2021-11-18 MED ORDER — ATORVASTATIN CALCIUM 40 MG PO TABS
40.0000 mg | ORAL_TABLET | Freq: Every day | ORAL | 3 refills | Status: AC
  Filled 2021-11-18 – 2021-12-19 (×2): qty 90, 90d supply, fill #0
  Filled 2022-03-13 – 2022-03-14 (×2): qty 30, 30d supply, fill #1

## 2021-11-19 ENCOUNTER — Telehealth: Payer: Self-pay | Admitting: Clinical

## 2021-11-19 NOTE — Telephone Encounter (Signed)
[facility] ?Case Management Referral Note ? ?11/19/2021 ?Name: [name]  MRN: [id number] DOB: 05/24/1966 ? [name] is a 56 y.o. year old male who sees [name], Tewana I, NP for primary care. LCSW was consulted to assess patient's needs and assist the patient with Financial Difficulties related to no income and lack of insurance . ? ?Interpreter: No.   Interpreter Name & Language: none ? ?Assessment: Patient experiencing Financial Difficulties related to no income and lack of insurance. ? ?Intervention: CSW called patient's caseworker with [facility] [employer] [facility] [employer]. Xuem also got patient on the phone and assisted with interpretation. Discussed housing and food needs. Patient does not currently have any income. Eusebio MeXuem is assisting him with SSI/D application. [employer] is assisting with rent, but this won't be a long term solution. Patient lives with a roommate. Patient does not have money for food. His food stamps expired. Eusebio MeXuem is meeting with him tomorrow to assist with new food stamps application. Discussed referral to [facility] [employer] [facility] community support and nutrition program. Patient consented to this referral. ? ?Review of patient status, including review of consultants reports, relevant laboratory and other test results, and collaboration with appropriate care team members and the patient's provider was performed as part of comprehensive patient evaluation and provision of services.   ? ?Abigail ButtsSusan Trixy Loyola, LCSW ?[facility] ?[phone] ?  ? ?

## 2021-11-22 ENCOUNTER — Other Ambulatory Visit: Payer: Self-pay

## 2021-11-22 ENCOUNTER — Ambulatory Visit: Payer: Self-pay | Attending: Family Medicine

## 2021-11-25 ENCOUNTER — Other Ambulatory Visit: Payer: Self-pay

## 2021-12-05 ENCOUNTER — Encounter (HOSPITAL_COMMUNITY): Payer: Self-pay | Admitting: *Deleted

## 2021-12-05 ENCOUNTER — Emergency Department (HOSPITAL_COMMUNITY): Payer: Self-pay

## 2021-12-05 ENCOUNTER — Encounter (HOSPITAL_COMMUNITY): Payer: Self-pay | Admitting: Emergency Medicine

## 2021-12-05 ENCOUNTER — Ambulatory Visit (HOSPITAL_COMMUNITY)
Admission: EM | Admit: 2021-12-05 | Discharge: 2021-12-05 | Disposition: A | Discharge status: Home / Self Care | Payer: Self-pay

## 2021-12-05 ENCOUNTER — Ambulatory Visit: Payer: Self-pay | Admitting: *Deleted

## 2021-12-05 ENCOUNTER — Other Ambulatory Visit: Payer: Self-pay

## 2021-12-05 ENCOUNTER — Emergency Department (HOSPITAL_COMMUNITY)
Admission: EM | Admit: 2021-12-05 | Discharge: 2021-12-05 | Disposition: A | Discharge status: Home / Self Care | Payer: Self-pay | Attending: Emergency Medicine | Admitting: Emergency Medicine

## 2021-12-05 DIAGNOSIS — I10 Essential (primary) hypertension: Secondary | ICD-10-CM | POA: Insufficient documentation

## 2021-12-05 DIAGNOSIS — H538 Other visual disturbances: Secondary | ICD-10-CM | POA: Insufficient documentation

## 2021-12-05 DIAGNOSIS — I160 Hypertensive urgency: Secondary | ICD-10-CM

## 2021-12-05 DIAGNOSIS — E119 Type 2 diabetes mellitus without complications: Secondary | ICD-10-CM | POA: Insufficient documentation

## 2021-12-05 DIAGNOSIS — Z7984 Long term (current) use of oral hypoglycemic drugs: Secondary | ICD-10-CM | POA: Insufficient documentation

## 2021-12-05 DIAGNOSIS — Z79899 Other long term (current) drug therapy: Secondary | ICD-10-CM | POA: Insufficient documentation

## 2021-12-05 HISTORY — DX: Essential (primary) hypertension: I10

## 2021-12-05 HISTORY — DX: Type 2 diabetes mellitus without complications: E11.9

## 2021-12-05 LAB — BASIC METABOLIC PANEL
Anion gap: 9 (ref 5–15)
BUN: 17 mg/dL (ref 6–20)
CO2: 25 mmol/L (ref 22–32)
Calcium: 9.1 mg/dL (ref 8.9–10.3)
Chloride: 96 mmol/L — ABNORMAL LOW (ref 98–111)
Creatinine, Ser: 1.27 mg/dL — ABNORMAL HIGH (ref 0.61–1.24)
GFR, Estimated: >60 mL/min (ref >60)
Glucose, Bld: 88 mg/dL (ref 70–99)
Potassium: 4 mmol/L (ref 3.5–5.1)
Sodium: 130 mmol/L — ABNORMAL LOW (ref 135–145)

## 2021-12-05 LAB — CBC WITH DIFFERENTIAL/PLATELET
Abs Immature Granulocytes: 0.21 10*3/uL — ABNORMAL HIGH (ref 0.00–0.07)
Basophils Absolute: 0 10*3/uL (ref 0.0–0.1)
Basophils Relative: 0 %
Eosinophils Absolute: 0.1 10*3/uL (ref 0.0–0.5)
Eosinophils Relative: 0 %
HCT: 32.9 % — ABNORMAL LOW (ref 39.0–52.0)
Hemoglobin: 10.6 g/dL — ABNORMAL LOW (ref 13.0–17.0)
Immature Granulocytes: 2 %
Lymphocytes Relative: 14 %
Lymphs Abs: 1.6 10*3/uL (ref 0.7–4.0)
MCH: 24.5 pg — ABNORMAL LOW (ref 26.0–34.0)
MCHC: 32.2 g/dL (ref 30.0–36.0)
MCV: 76.2 fL — ABNORMAL LOW (ref 80.0–100.0)
Monocytes Absolute: 1 10*3/uL (ref 0.1–1.0)
Monocytes Relative: 9 %
Neutro Abs: 8.9 10*3/uL — ABNORMAL HIGH (ref 1.7–7.7)
Neutrophils Relative %: 75 %
Platelets: 404 10*3/uL — ABNORMAL HIGH (ref 150–400)
RBC: 4.32 MIL/uL (ref 4.22–5.81)
RDW: 14.2 % (ref 11.5–15.5)
WBC: 11.8 10*3/uL — ABNORMAL HIGH (ref 4.0–10.5)
nRBC: 0 % (ref 0.0–0.2)

## 2021-12-05 MED ORDER — AMLODIPINE BESYLATE 5 MG PO TABS
5.0000 mg | ORAL_TABLET | Freq: Once | ORAL | Status: AC
Start: 2021-12-05 — End: 2021-12-05
  Administered 2021-12-05: 5 mg via ORAL
  Filled 2021-12-05: qty 1

## 2021-12-05 MED ORDER — AMLODIPINE BESYLATE 5 MG PO TABS: 5.0000 mg | ORAL_TABLET | Freq: Every day | ORAL | 0 refills | Status: DC

## 2021-12-05 MED ORDER — HYDROCHLOROTHIAZIDE 12.5 MG PO TABS: 12.5000 mg | ORAL_TABLET | Freq: Every day | ORAL | Status: DC

## 2021-12-05 MED ORDER — ACETAMINOPHEN 500 MG PO TABS
1000.0000 mg | ORAL_TABLET | Freq: Once | ORAL | Status: AC
Start: 2021-12-05 — End: 2021-12-05
  Administered 2021-12-05: 1000 mg via ORAL
  Filled 2021-12-05: qty 2

## 2021-12-05 MED ORDER — LOSARTAN POTASSIUM-HCTZ 50-12.5 MG PO TABS: 1.0000 | ORAL_TABLET | Freq: Every day | ORAL | Status: DC

## 2021-12-05 MED ORDER — LOSARTAN POTASSIUM 50 MG PO TABS: 50.0000 mg | ORAL_TABLET | Freq: Every day | ORAL | Status: DC

## 2021-12-05 NOTE — ED Triage Notes (Addendum)
Pt had his BP checked earlier today and BP was high 200/112. Pt was advised to come to [facility] for assessment. Pt reports he has HA because BP is high . Pt reports blurred vision over 2 months off and on.Pt reports he has blurred vision now with heavy feeling on his head. ?

## 2021-12-05 NOTE — ED Provider Triage Note (Signed)
Emergency Medicine Provider Triage Evaluation Note  Wayne Stokes , a 56 y.o. male  was evaluated in triage.  Pt complains of elevated blood pressure. Pt went to urgent care today and they sent him here for further eval. He is complaining of headaches and difficulty standing for long periods of time.   Review of Systems  Positive: As above Negative: CP, SOB, blurry vision  Physical Exam  BP (!) 184/109   Pulse 66   Temp (!) 97.5 F (36.4 C) (Oral)   Resp 18   SpO2 99%  Gen:   Awake, no distress   Resp:  Normal effort  MSK:   Moves extremities without difficulty  Other:    Medical Decision Making  Medically screening exam initiated at 5:48 PM.  Appropriate orders placed.  Wayne Stokes was informed that the remainder of the evaluation will be completed by another provider, this initial triage assessment does not replace that evaluation, and the importance of remaining in the ED until their evaluation is complete.     Kateri Plummer, PA-C 12/05/21 1750

## 2021-12-05 NOTE — ED Notes (Signed)
Patient transported to CT 

## 2021-12-05 NOTE — ED Provider Notes (Signed)
?[facility] ? ? ? ?CSN: [id number] ?Arrival date & time: 12/05/21  1539 ? ? ?  ? ?History   ?Chief Complaint ?Chief Complaint  ?Patient presents with  ? Hypertension  ? ? ?HPI ?Wayne Stokes is a 56 y.o. male.  ? ?Patient presents with 2 close friends who are serving as interpreters; our medical interpreter is do not speak the same dialect as the patient.  Patient reports elevated blood pressure noted for the past few days.  Reports his primary care provider has been trending his blood pressure under control and he is taking his medication as prescribed.  He reports headache and vision changes today.  He denies shortness of breath, chest pain, leg swelling.  He does also report his legs are extremely painful. ? ? ?Past Medical History:  ?Diagnosis Date  ? Diabetes mellitus without complication (HCC)   ? Hypertension   ? ? ?There are no problems to display for this patient. ? ? ?History reviewed. No pertinent surgical history. ? ? ? ? ?Home Medications   ? ?Prior to Admission medications   ?Medication Sig Start Date End Date Taking? Authorizing Provider  ?acetaminophen (TYLENOL 8 HOUR ARTHRITIS PAIN) 650 MG CR tablet Take 1 tablet (650 mg total) by mouth every 8 (eight) hours as needed for pain. 11/15/21   [name], NP  ?atorvastatin (LIPITOR) 40 MG tablet Take 1 tablet (40 mg total) by mouth daily. 11/18/21   [name], NP  ?diclofenac Sodium (VOLTAREN) 1 % GEL Apply 4 g topically 4 (four) times daily as needed for up to 22 days (body pain). 11/15/21 12/07/21  [name], NP  ?glipiZIDE (GLUCOTROL) 5 MG tablet Take 1 tablet (5 mg total) by mouth 2 (two) times daily before a meal. 11/15/21   [name], [name], NP  ?losartan-hydrochlorothiazide (HYZAAR) 50-12.5 MG tablet Take 1 tablet by mouth daily. 11/15/21 02/13/22  [name], NP  ?meclizine (ANTIVERT) 25 MG tablet Take 1 tablet (25 mg total) by mouth 3 (three) times daily as needed. ?Patient not taking: Reported on 11/15/2021  07/03/14   Garlon Hatchet, PA-C  ?metFORMIN (GLUCOPHAGE) 500 MG tablet Take 1 tablet (500 mg total) by mouth 2 (two) times daily with a meal. 11/15/21   [name], NP  ? ? ?Family History ?Family History  ?Family history unknown: Yes  ? ? ?Social History ?Social History  ? ?Tobacco Use  ? Smoking status: Never  ? Smokeless tobacco: Never  ?Vaping Use  ? Vaping Use: Never used  ?Substance Use Topics  ? Alcohol use: Never  ? Drug use: Never  ? ? ? ?Allergies   ?Beef-derived products, Pork-derived products, and Shellfish allergy ? ? ?Review of Systems ?Review of Systems ?Per HPI ? ?Physical Exam ?Triage Vital Signs ?ED Triage Vitals  ?Enc Vitals Group  ?   BP 12/05/21 1642 (!) 196/120  ?   Pulse Rate 12/05/21 1642 66  ?   Resp 12/05/21 1642 18  ?   Temp 12/05/21 1642 98.3 ?F (36.8 ?C)  ?   Temp src --   ?   SpO2 12/05/21 1642 97 %  ?   Weight --   ?   Height --   ?   Head Circumference --   ?   Peak Flow --   ?   Pain Score 12/05/21 1638 0  ?   Pain Loc --   ?   Pain Edu? --   ?   Excl.  in GC? --   ? ?No data found. ? ?Updated Vital Signs ?BP (!) 196/120   Pulse 66   Temp 98.3 ?F (36.8 ?C)   Resp 18   SpO2 97%  ? ?Visual Acuity ?Right Eye Distance:   ?Left Eye Distance:   ?Bilateral Distance:   ? ?Right Eye Near:   ?Left Eye Near:    ?Bilateral Near:    ? ?Physical Exam ?Vitals and nursing note reviewed.  ?Constitutional:   ?   General: He is not in acute distress. ?   Appearance: Normal appearance. He is obese. He is not toxic-appearing.  ?Eyes:  ?   Pupils: Pupils are equal, round, and reactive to light.  ?Cardiovascular:  ?   Rate and Rhythm: Normal rate and regular rhythm.  ?Pulmonary:  ?   Effort: Pulmonary effort is normal. No respiratory distress.  ?   Breath sounds: Normal breath sounds. No wheezing, rhonchi or rales.  ?Skin: ?   General: Skin is warm and dry.  ?   Coloration: Skin is not jaundiced.  ?   Findings: No erythema.  ?Neurological:  ?   Mental Status: He is alert. Mental status is at  baseline.  ?   Gait: Gait normal.  ?Psychiatric:     ?   Mood and Affect: Mood normal.  ? ? ? ?UC Treatments / Results  ?Labs ?(all labs ordered are listed, but only abnormal results are displayed) ?Labs Reviewed - No data to display ? ?EKG ? ? ?Radiology ?No results found. ? ?Procedures ?Procedures (including critical care time) ? ?Medications Ordered in UC ?Medications - No data to display ? ?Initial Impression / Assessment and Plan / UC Course  ?I have reviewed the triage vital signs and the nursing notes. ? ?Pertinent labs & imaging results that were available during my care of the patient were reviewed by me and considered in my medical decision making (see chart for details). ? ?  ?Given significantly elevated blood pressure with headache and vision changes, I recommended the patient be seen in the emergency room emergently for hypertensive urgency.  The patient is in agreement to this plan.  All questions answered. ?Final Clinical Impressions(s) / UC Diagnoses  ? ?Final diagnoses:  ?Hypertensive urgency  ? ? ? ?Discharge Instructions   ? ?  ?- Please go straight to the Emergency for further evaluation and management of your high blood pressure ? ? ? ?ED Prescriptions   ?None ?  ? ?PDMP not reviewed this encounter. ?  ?Valentino Nose, NP ?12/05/21 1704 ? ?

## 2021-12-05 NOTE — Discharge Instructions (Addendum)
You were evaluated in the Emergency Department and after careful evaluation, we did not find any emergent condition requiring admission or further testing in the hospital. ? ?Your exam/testing today was overall reassuring. We were concerned for HTN urgency. Your symptoms of headache and blurry vision improved and you chose outpatient management over admission today. ? ?Please return to the Emergency Department if you experience any worsening of your condition.  Thank you for allowing [place] to be a part of your care.  ?

## 2021-12-05 NOTE — ED Triage Notes (Signed)
Informed Provider of Pt's elevated BP and blurred vision. ?

## 2021-12-05 NOTE — ED Notes (Signed)
Patient is A/O x3.Patient discharged by MD Horton.  Patient given discharge instructions. Patient verbalized understanding of all instructions . Patient denies headache , dizziness or chest pain . Patient discharged in no acute distress.  ?

## 2021-12-05 NOTE — Congregational Nurse Program (Signed)
Home visit with Caseworker Mariane Baumgarten who is assisting patient with applying for disability.  BP is 200/112.  States he has been taking BP medicine as prescribed on 11/15/2021.  Referred patient to [facility] and will follow-up with home visit on 12/11/2021 to recheck BP.  Brantley Fling RN, Congregational Nurse [phone] ?

## 2021-12-05 NOTE — ED Provider Notes (Signed)
?[facility] ?Provider Note ? ? ?CSN: [id number] ?Arrival date & time: 12/05/21  1709 ? ?  ? ?History ? ?Chief Complaint  ?Patient presents with  ? Hypertension  ? ? ?Wayne Stokes is a 56 y.o. male w/ hypertension and diabetes presenting to the ED for elevated blood pressures with associated headache and blurry vision.  Patient reports has had this mild headache and blurry vision since he was prescribed his blood pressure medication on 3/10.  However it did get worse today prompting him to go to urgent care.  At urgent care he was found to have elevated blood pressures.  Daughter reports blood pressures in the 200s prior to arrival.  UC sent him to the ED to be evaluated for hypertension emergency.  Denies any bloody stools, vomiting blood, or dark stools. Patient did not take his HCTZ/losartan today because it did not seem to be helping his blood pressures or symptoms. ? ?Unable to have the dialect of interpreter available.  Daughter at bedside and patient agrees to have her interpret at this time given limited ability of our tele interpreters. ? ? ?Hypertension ?Associated symptoms include headaches. Pertinent negatives include no chest pain, no abdominal pain and no shortness of breath.  ? ?  ? ?Home Medications ?Prior to Admission medications   ?Medication Sig Start Date End Date Taking? Authorizing Provider  ?acetaminophen (TYLENOL 8 HOUR ARTHRITIS PAIN) 650 MG CR tablet Take 1 tablet (650 mg total) by mouth every 8 (eight) hours as needed for pain. 11/15/21  Yes [name], Enid Derryewana I, NP  ?amLODipine (NORVASC) 5 MG tablet Take 1 tablet (5 mg total) by mouth daily for 15 days. 12/05/21 12/20/21 Yes Micheline MazeKiehl, Velicia Dejager, MD  ?diclofenac Sodium (VOLTAREN) 1 % GEL Apply 4 g topically 4 (four) times daily as needed for up to 22 days (body pain). 11/15/21 12/07/21 Yes [name], Enid Derryewana I, NP  ?glipiZIDE (GLUCOTROL) 5 MG tablet Take 1 tablet (5 mg total) by mouth 2 (two) times daily before a meal.  11/15/21  Yes [name], Tewana I, NP  ?losartan-hydrochlorothiazide (HYZAAR) 50-12.5 MG tablet Take 1 tablet by mouth daily. 11/15/21 02/13/22 Yes [name], Enid Derryewana I, NP  ?metFORMIN (GLUCOPHAGE) 500 MG tablet Take 1 tablet (500 mg total) by mouth 2 (two) times daily with a meal. 11/15/21  Yes [name], Enid Derryewana I, NP  ?Omega 3 1000 MG CAPS Take 1,000 mg by mouth daily.   Yes [provider]  ?atorvastatin (LIPITOR) 40 MG tablet Take 1 tablet (40 mg total) by mouth daily. ?Patient not taking: Reported on 12/05/2021 11/18/21   Orion CrookPassmore, Tewana I, NP  ?meclizine (ANTIVERT) 25 MG tablet Take 1 tablet (25 mg total) by mouth 3 (three) times daily as needed. ?Patient not taking: Reported on 11/15/2021 07/03/14   Garlon HatchetSanders, Lisa M, PA-C  ?   ? ?Allergies    ?Beef-derived products, Pork-derived products, Shellfish allergy, Eggs or egg-derived products, and Other   ? ?Review of Systems   ?Review of Systems  ?Constitutional:  Negative for activity change, chills and fever.  ?Eyes:  Positive for visual disturbance.  ?Respiratory:  Negative for chest tightness and shortness of breath.   ?Cardiovascular:  Negative for chest pain.  ?Gastrointestinal:  Negative for abdominal pain, nausea and vomiting.  ?Skin:  Negative for rash and wound.  ?Neurological:  Positive for headaches. Negative for light-headedness.  ? ?Physical Exam ?Updated Vital Signs ?BP (!) 170/107 (BP Location: Left Arm)   Pulse 67   Temp (!) 97.5 ?F (36.4 ?  C) (Oral)   Resp 17   SpO2 98%  ?Physical Exam ?Vitals and nursing note reviewed.  ?Constitutional:   ?   General: He is not in acute distress. ?   Appearance: He is well-developed.  ?HENT:  ?   Head: Normocephalic and atraumatic.  ?Eyes:  ?   Conjunctiva/sclera: Conjunctivae normal.  ?Cardiovascular:  ?   Rate and Rhythm: Normal rate and regular rhythm.  ?   Heart sounds: No murmur heard. ?Pulmonary:  ?   Effort: Pulmonary effort is normal. No respiratory distress.  ?   Breath sounds: Normal breath sounds.   ?Abdominal:  ?   Palpations: Abdomen is soft.  ?   Tenderness: There is no abdominal tenderness.  ?Musculoskeletal:     ?   General: No swelling.  ?   Cervical back: Neck supple.  ?Skin: ?   General: Skin is warm and dry.  ?   Capillary Refill: Capillary refill takes less than 2 seconds.  ?Neurological:  ?   General: No focal deficit present.  ?   Mental Status: He is alert and oriented to person, place, and time.  ?   Cranial Nerves: No cranial nerve deficit.  ?   Sensory: No sensory deficit.  ?   Motor: No weakness.  ?Psychiatric:     ?   Mood and Affect: Mood normal.  ? ? ?ED Results / Procedures / Treatments   ?Labs ?(all labs ordered are listed, but only abnormal results are displayed) ?Labs Reviewed  ?CBC WITH DIFFERENTIAL/PLATELET - Abnormal; Notable for the following components:  ?    Result Value  ? WBC 11.8 (*)   ? Hemoglobin 10.6 (*)   ? HCT 32.9 (*)   ? MCV 76.2 (*)   ? MCH 24.5 (*)   ? Platelets 404 (*)   ? Neutro Abs 8.9 (*)   ? Abs Immature Granulocytes 0.21 (*)   ? All other components within normal limits  ?BASIC METABOLIC PANEL - Abnormal; Notable for the following components:  ? Sodium 130 (*)   ? Chloride 96 (*)   ? Creatinine, Ser 1.27 (*)   ? All other components within normal limits  ? ? ?EKG ?None ? ?Radiology ?CT Head Wo Contrast ? ?Result Date: 12/05/2021 ?CLINICAL DATA:  Headache, new or worsening. EXAM: CT HEAD WITHOUT CONTRAST TECHNIQUE: Contiguous axial images were obtained from the base of the skull through the vertex without intravenous contrast. RADIATION DOSE REDUCTION: This exam was performed according to the departmental dose-optimization program which includes automated exposure control, adjustment of the mA and/or kV according to patient size and/or use of iterative reconstruction technique. COMPARISON:  CT head dated July 03 2014. FINDINGS: Brain: No evidence of acute infarction, hemorrhage, hydrocephalus, extra-axial collection or mass lesion/mass effect. Mega cisterna  magna incidentally noted. Vascular: No hyperdense vessel or unexpected calcification. Skull: Normal. Negative for fracture or focal lesion. Sinuses/Orbits: No acute finding. Other: None. IMPRESSION: No acute intracranial abnormality. Electronically Signed   By: Larose Hires D.O.   On: 12/05/2021 22:39   ? ?Procedures ?Procedures  ?Medications Ordered in ED ?Medications  ?losartan (COZAAR) tablet 50 mg (has no administration in time range)  ?  And  ?hydrochlorothiazide (HYDRODIURIL) tablet 12.5 mg (has no administration in time range)  ?amLODipine (NORVASC) tablet 5 mg (5 mg Oral Given 12/05/21 2052)  ?acetaminophen (TYLENOL) tablet 1,000 mg (1,000 mg Oral Given 12/05/21 2052)  ? ? ?ED Course/ Medical Decision Making/ A&P ?Clinical Course as of 12/06/21 0025  ?  Thu Dec 05, 2021  ?2256 CT head with no acute abnormalities.  [RK]  ?  ?Clinical Course User Index ?[RK] Micheline Maze, MD  ? ?                        ?Medical Decision Making ?Amount and/or Complexity of Data Reviewed ?Radiology: ordered. ? ?Risk ?OTC drugs. ?Prescription drug management. ? ? ?56 year old male who on chart review has history of hypertension who is on HCTZ losartan and history of diabetes who is on metformin presenting to the ED for headache and blurry vision in the setting of elevated blood pressures.  Additional collateral obtained from daughter. ? ?On exam, no focal deficits and overall well-appearing.  Initial blood pressure of 170/107 which is reassuring.  Repeat blood pressure increased to 196/99.  Patient given 5 mg of amlodipine repeat blood pressure of 209/111.  Patient given 5 mg of amlodipine.  Patient given his home medication.   Labs remarkable for sodium of 130, chloride 96, creatinine of 1.27 (prior 1.21 3 weeks ago), Hg 10.6 (prior 12.9) with MCV of 76.2, WBC of 11.8, platelets of 404.  Asked about any blood loss and denies any dark stools, hematemesis, or bloody stools.   ? ?CT head obtained which shows no acute focal  abnormalities on my review which is confirmed by radiology.  Patient reassessed and reports improvement of his headache and blurry vision.  Repeat blood pressure of 184/109.  EKG normal sinus rhythm with no ischemic changes.  Poo

## 2021-12-05 NOTE — Discharge Instructions (Signed)
-   Please go straight to the Emergency for further evaluation and management of your high blood pressure ?

## 2021-12-05 NOTE — ED Triage Notes (Signed)
Patient sent to ED from Urgent Care for evaluation of hypertension and difficulty standing for long periods of time. Patient reports headaches, denies blurred vision, denies shortness of breath, denies chest pain. Patient is alert, oriented, ambulatory, and in no apparent distress at this time.  ?

## 2021-12-05 NOTE — Telephone Encounter (Signed)
? ?  Chief Complaint: BP elevated ?Symptoms: BP 200/212, "Pounding headache" ?Frequency: this afternoon ?Pertinent Negatives: Patient denies  ?Disposition: [x] ED /[] Urgent Care (no appt availability in office) / [] Appointment(In office/virtual)/ []  [facility]/ [] Home Care/ [] Refused Recommended Disposition /[] [facility]/ []  Follow-up with PCP ?Additional Notes: Manager of refugees at [facility] calling to report pt's BP. Does not have appt until next week with PCP. Advised ED. States will alert pt.  ? Reason for Disposition ? [id number] Systolic BP  >= 0000000 OR Diastolic >= [id number] AND [id number] cardiac or neurologic symptoms (e.g., chest pain, difficulty breathing, unsteady gait, blurred vision) ? ?Answer Assessment - Initial Assessment Questions ?1. BLOOD PRESSURE: "What is the blood pressure?" "Did you take at least two measurements 5 minutes apart?" ?    200/112 ?2. ONSET: "When did you take your blood pressure?" ?    Now ?3. HOW: "How did you obtain the blood pressure?" (e.g., visiting nurse, automatic home BP monitor) ?    Home nurse ?4. HISTORY: "Do you have a history of high blood pressure?" ?    Yes ?5. MEDICATIONS: "Are you taking any medications for blood pressure?" "Have you missed any doses recently?" ?     ?6. OTHER SYMPTOMS: "Do you have any symptoms?" (e.g., headache, chest pain, blurred vision, difficulty breathing, weakness) ?    Headache ? ?Protocols used: Blood Pressure - High-A-AH ? ?

## 2021-12-11 ENCOUNTER — Other Ambulatory Visit: Payer: Self-pay

## 2021-12-11 MED ORDER — AMLODIPINE BESYLATE 5 MG PO TABS
ORAL_TABLET | ORAL | 0 refills | Status: DC
  Filled 2021-12-11: qty 15, 15d supply, fill #0

## 2021-12-11 NOTE — Congregational Nurse Program (Signed)
Home visit with interpreter Diu Hartshorn for BP recheck.  170/106 in left arm.  Patient has not gotten Amlodipine prescription filled because he did not understand.  CN dropped prescription off at [facility] [address] Case manager Margurite Auerbach will have someone pick it up and deliver to patient. Reminded him of appointment tomorrow with [facility] and PCP Lucretia Kern on 12/16/2021.  Case Manager will provide transportation.  Follow-up in 1 week to recheck BP. Brantley Fling RN, Congregational nurse [phone] ?

## 2021-12-12 ENCOUNTER — Encounter: Payer: Self-pay | Admitting: Neurology

## 2021-12-12 ENCOUNTER — Other Ambulatory Visit: Payer: Self-pay

## 2021-12-12 ENCOUNTER — Ambulatory Visit (INDEPENDENT_AMBULATORY_CARE_PROVIDER_SITE_OTHER): Payer: Self-pay | Admitting: Neurology

## 2021-12-12 VITALS — BP 148/94 | HR 94 | Ht 60.0 in | Wt 138.5 lb

## 2021-12-12 DIAGNOSIS — G3184 Mild cognitive impairment, so stated: Secondary | ICD-10-CM

## 2021-12-12 MED ORDER — DONEPEZIL HCL 5 MG PO TABS
5.0000 mg | ORAL_TABLET | Freq: Every day | ORAL | 11 refills | Status: AC
  Filled 2021-12-12: qty 30, 30d supply, fill #0
  Filled 2022-01-08: qty 30, 30d supply, fill #1
  Filled 2022-03-13 – 2022-03-14 (×2): qty 30, 30d supply, fill #2

## 2021-12-12 NOTE — Patient Instructions (Signed)
Start with Aricept 5 mg at bedtime  ?Continue your current medications  ?Follow up with your primary care physician  ?Return in 1 year  ?
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
## 2021-12-13 LAB — DEMENTIA PANEL
Homocysteine: 13.1 umol/L (ref 0.0–14.5)
RPR Ser Ql: NONREACTIVE
TSH: 0.495 u[IU]/mL (ref 0.450–4.500)
Vitamin B-12: 1312 pg/mL — ABNORMAL HIGH (ref 232–1245)

## 2021-12-16 ENCOUNTER — Encounter: Payer: Self-pay | Admitting: Nurse Practitioner

## 2021-12-16 ENCOUNTER — Ambulatory Visit (INDEPENDENT_AMBULATORY_CARE_PROVIDER_SITE_OTHER): Payer: Self-pay | Admitting: Nurse Practitioner

## 2021-12-16 ENCOUNTER — Other Ambulatory Visit: Payer: Self-pay

## 2021-12-16 ENCOUNTER — Telehealth: Payer: Self-pay | Admitting: *Deleted

## 2021-12-16 VITALS — BP 155/110 | HR 89 | Temp 97.6°F | Ht 60.0 in | Wt 138.0 lb

## 2021-12-16 DIAGNOSIS — G6289 Other specified polyneuropathies: Secondary | ICD-10-CM

## 2021-12-16 DIAGNOSIS — I10 Essential (primary) hypertension: Secondary | ICD-10-CM

## 2021-12-16 DIAGNOSIS — E119 Type 2 diabetes mellitus without complications: Secondary | ICD-10-CM

## 2021-12-16 DIAGNOSIS — M2559 Pain in other specified joint: Secondary | ICD-10-CM

## 2021-12-16 DIAGNOSIS — Z789 Other specified health status: Secondary | ICD-10-CM

## 2021-12-16 LAB — POCT GLYCOSYLATED HEMOGLOBIN (HGB A1C)
HbA1c POC (<> result, manual entry): 8 % (ref 4.0–5.6)
HbA1c, POC (controlled diabetic range): 8 % — AB (ref 0.0–7.0)
HbA1c, POC (prediabetic range): 8 % — AB (ref 5.7–6.4)
Hemoglobin A1C: 8 % — AB (ref 4.0–5.6)

## 2021-12-16 MED ORDER — AMLODIPINE BESYLATE 5 MG PO TABS
5.0000 mg | ORAL_TABLET | Freq: Every day | ORAL | 2 refills | Status: DC
  Filled 2021-12-16: qty 30, 30d supply, fill #0
  Filled 2022-01-08: qty 30, 30d supply, fill #1
  Filled 2022-02-12: qty 30, 30d supply, fill #2

## 2021-12-16 MED ORDER — METOPROLOL SUCCINATE ER 25 MG PO TB24
25.0000 mg | ORAL_TABLET | Freq: Every day | ORAL | 2 refills | Status: DC
  Filled 2021-12-16: qty 30, 30d supply, fill #0
  Filled 2022-01-08: qty 30, 30d supply, fill #1
  Filled 2022-02-12: qty 30, 30d supply, fill #2

## 2021-12-16 MED ORDER — GABAPENTIN 300 MG PO CAPS
300.0000 mg | ORAL_CAPSULE | Freq: Three times a day (TID) | ORAL | 2 refills | Status: DC
  Filled 2021-12-16: qty 90, 30d supply, fill #0
  Filled 2022-02-12: qty 90, 30d supply, fill #1
  Filled 2022-03-13 – 2022-03-14 (×2): qty 90, 30d supply, fill #2

## 2021-12-16 MED ORDER — LOSARTAN POTASSIUM-HCTZ 50-12.5 MG PO TABS
1.0000 | ORAL_TABLET | Freq: Every day | ORAL | 2 refills | Status: DC
  Filled 2021-12-16 – 2022-03-14 (×3): qty 30, 30d supply, fill #0
  Filled 2022-04-14: qty 30, 30d supply, fill #1

## 2021-12-16 NOTE — Progress Notes (Signed)
Please call and advise the patient that the recent dementia labs we checked were within normal limits. We checked vitamin B12 level, thyroid function, RPR and homocysteine.  ?No further action is required on these tests at this time. Please remind patient to keep any upcoming appointments or tests and to call us with any interim questions, concerns, problems or updates. Thanks,  ? ?Windell NorfolkAmadou Joelee Snoke, MD ? ?

## 2021-12-16 NOTE — Patient Instructions (Signed)
You were seen today in the [facility] for reevaluation of chronic illness and joint pain. Labs were collected, results will be available via MyChart or, if abnormal, you will be contacted by clinic staff. You were prescribed medications, please take as directed. Please follow up in 3 mths for reevaluation of chronic illness and pain.  ?

## 2021-12-16 NOTE — Telephone Encounter (Signed)
Attempted to call pt, LVM for call back  °

## 2021-12-16 NOTE — Telephone Encounter (Signed)
-----   Message from Windell Norfolk, MD sent at 12/16/2021  8:56 AM EDT ----- ?Please call and advise the patient that the recent dementia labs we checked were within normal limits. We checked vitamin B12 level, thyroid function, RPR and homocysteine.  ?No further action is required on these tests at this time. Please remind patient to keep any upcoming appointments or tests and to call us with any interim questions, concerns, problems or updates. Thanks,  ? ?Windell Norfolk, MD ? ?  ?

## 2021-12-16 NOTE — Progress Notes (Signed)
? ?[facility] ?[place], [place]  [address] ?Phone:  [phone]   Fax:  [phone] ?Subjective:  ? Patient ID: Wayne Stokes, male    DOB: 1966/05/06, 56 y.o.   MRN: [id number] ? ?Chief Complaint  ?Patient presents with  ? Follow-up  ?  Patient is here today for his 1 month follow up visit.  ?Patient states that he has still been having numbness in both feet and it is hard to walk at times. Patient state stat it was addressed at his last visit but the Voltaren gel and OTC medication is not working.  ? ?HPI ?Wayne Stokes 56 y.o. male  has a past medical history of Diabetes mellitus without complication ([place]) and Hypertension. To the [facility] for reevaluation of HTN and DM. ? ?Patient has been compliant with all medications. Interpreter at bedside indicates that patient never initiated amlodipine. ? ?Continues to bilateral knee pain, even when taking OTC medications and Voltaren. Denies any pain today, but states that the numbness in bilateral feet is more pronounced than pain in bilateral knees.  ? ?Denies any alcohol usage or smoking. Has changed diet, with no pork or beef or chicken. Mostly eats rice and vegetables. Endorses continued consumption of bread frequently during the week. Denies any other concerns today. ? ?Denies any fatigue, chest pain, shortness of breath, HA or dizziness. Denies any blurred vision. ? ?Past Medical History:  ?Diagnosis Date  ? Diabetes mellitus without complication ([place])   ? Hypertension   ? ? ?History reviewed. No pertinent surgical history. ? ?Family History  ?Family history unknown: Yes  ? ? ?Social History  ? ?Socioeconomic History  ? Marital status: Single  ?  Spouse name: Not on file  ? Number of children: Not on file  ? Years of education: Not on file  ? Highest education level: Not on file  ?Occupational History  ? Not on file  ?Tobacco Use  ? Smoking status: Never  ? Smokeless tobacco: Never  ?Vaping Use  ? Vaping Use: Never used  ?Substance and  Sexual Activity  ? Alcohol use: Never  ? Drug use: Never  ? Sexual activity: Not Currently  ?Other Topics Concern  ? Not on file  ?Social History Narrative  ? Not on file  ? ?Social Determinants of Health  ? ?Financial Resource Strain: Not on file  ?Food Insecurity: Food Insecurity Present  ? Worried About Charity fundraiser in the Last Year: Often true  ? Ran Out of Food in the Last Year: Often true  ?Transportation Needs: Unmet Transportation Needs  ? Lack of Transportation (Medical): Yes  ? Lack of Transportation (Non-Medical): Yes  ?Physical Activity: Not on file  ?Stress: Not on file  ?Social Connections: Not on file  ?Intimate Partner Violence: Not on file  ? ? ?Outpatient Medications Prior to Visit  ?Medication Sig Dispense Refill  ? glipiZIDE (GLUCOTROL) 5 MG tablet Take 1 tablet (5 mg total) by mouth 2 (two) times daily before a meal. 60 tablet 3  ? metFORMIN (GLUCOPHAGE) 500 MG tablet Take 1 tablet (500 mg total) by mouth 2 (two) times daily with a meal. 180 tablet 3  ? amLODipine (NORVASC) 5 MG tablet take 1 tablet by mouth daily for 15 days 15 tablet 0  ? losartan-hydrochlorothiazide (HYZAAR) 50-12.5 MG tablet Take 1 tablet by mouth daily. 30 tablet 2  ? atorvastatin (LIPITOR) 40 MG tablet Take 1 tablet (40 mg total) by mouth daily. (Patient not taking:  Reported on 12/05/2021) 90 tablet 3  ? donepezil (ARICEPT) 5 MG tablet Take 1 tablet (5 mg total) by mouth at bedtime. (Patient not taking: Reported on 12/16/2021) 30 tablet 11  ? meclizine (ANTIVERT) 25 MG tablet Take 1 tablet (25 mg total) by mouth 3 (three) times daily as needed. (Patient not taking: Reported on 11/15/2021) 30 tablet 0  ? Omega 3 1000 MG CAPS Take 1,000 mg by mouth daily. (Patient not taking: Reported on 12/16/2021)    ? ?No facility-administered medications prior to visit.  ? ? ?Allergies  ?Allergen Reactions  ? Beef-Derived Products Swelling  ? Pork-Derived Products Swelling  ? Shellfish Allergy Rash  ? Eggs Or Egg-Derived Products  Swelling  ? Other Other (See Comments)  ?  Egg plant , pizza - swelling  ? ? ?Review of Systems  ?Constitutional:  Negative for chills, fever and malaise/fatigue.  ?Respiratory:  Negative for cough and shortness of breath.   ?Cardiovascular:  Negative for chest pain, palpitations and leg swelling.  ?Gastrointestinal:  Negative for abdominal pain, blood in stool, constipation, diarrhea, nausea and vomiting.  ?Musculoskeletal:  Positive for joint pain. Negative for back pain, falls, myalgias and neck pain.  ?Skin: Negative.   ?Neurological:  Positive for tingling and sensory change. Negative for dizziness, tremors, speech change, focal weakness, seizures, loss of consciousness, weakness and headaches.  ?Psychiatric/Behavioral:  Negative for depression. The patient is not nervous/anxious.   ?All other systems reviewed and are negative. ? ?   ?Objective:  ?  ?Physical Exam ?Constitutional:   ?   General: He is not in acute distress. ?   Appearance: Normal appearance. He is normal weight.  ?HENT:  ?   Head: Normocephalic.  ?Cardiovascular:  ?   Rate and Rhythm: Normal rate and regular rhythm.  ?   Pulses: Normal pulses.  ?   Heart sounds: Normal heart sounds.  ?   Comments: No obvious peripheral edema ?Pulmonary:  ?   Effort: Pulmonary effort is normal.  ?   Breath sounds: Normal breath sounds.  ?Musculoskeletal:     ?   General: No swelling, tenderness, deformity or signs of injury. Normal range of motion.  ?   Right lower leg: No edema.  ?   Left lower leg: No edema.  ?   Comments: Patient ambulating today without usage of cane   ?Skin: ?   General: Skin is warm and dry.  ?   Capillary Refill: Capillary refill takes less than 2 seconds.  ?Neurological:  ?   General: No focal deficit present.  ?   Mental Status: He is alert and oriented to person, place, and time.  ?Psychiatric:     ?   Mood and Affect: Mood normal.     ?   Behavior: Behavior normal.     ?   Thought Content: Thought content normal.     ?   Judgment:  Judgment normal.  ? ? ?BP (!) 155/110 (BP Location: Left Arm, Cuff Size: Normal)   Pulse 89   Temp 97.6 ?F (36.4 ?C)   Ht 5' (1.524 m)   Wt 138 lb (62.6 kg)   SpO2 98%   BMI 26.95 kg/m?  ?Wt Readings from Last 3 Encounters:  ?12/16/21 138 lb (62.6 kg)  ?12/12/21 138 lb 8 oz (62.8 kg)  ?11/15/21 135 lb 3.2 oz (61.3 kg)  ? ? ?Immunization History  ?Administered Date(s) Administered  ? Influenza,inj,Quad PF,6+ Mos 06/26/2021  ? PFIZER(Purple Top)SARS-COV-2 Vaccination 03/15/2020, 04/07/2020  ? ? ?  Diabetic Foot Exam - Simple   ?No data filed ?  ? ? ?Lab Results  ?Component Value Date  ? TSH 0.495 12/12/2021  ? ?Lab Results  ?Component Value Date  ? WBC 11.8 (H) 12/05/2021  ? HGB 10.6 (L) 12/05/2021  ? HCT 32.9 (L) 12/05/2021  ? MCV 76.2 (L) 12/05/2021  ? PLT 404 (H) 12/05/2021  ? ?Lab Results  ?Component Value Date  ? NA 130 (L) 12/05/2021  ? K 4.0 12/05/2021  ? CO2 25 12/05/2021  ? GLUCOSE 88 12/05/2021  ? BUN 17 12/05/2021  ? CREATININE 1.27 (H) 12/05/2021  ? BILITOT 0.3 11/15/2021  ? ALKPHOS 92 11/15/2021  ? AST 25 11/15/2021  ? ALT 24 11/15/2021  ? PROT 6.6 11/15/2021  ? ALBUMIN 4.0 11/15/2021  ? CALCIUM 9.1 12/05/2021  ? ANIONGAP 9 12/05/2021  ? EGFR 70 11/15/2021  ? ?Lab Results  ?Component Value Date  ? CHOL 235 (H) 11/15/2021  ? ?Lab Results  ?Component Value Date  ? HDL 55 11/15/2021  ? ?Lab Results  ?Component Value Date  ? LDLCALC 154 (H) 11/15/2021  ? ?Lab Results  ?Component Value Date  ? TRIG 144 11/15/2021  ? ?Lab Results  ?Component Value Date  ? CHOLHDL 4.3 11/15/2021  ? ?Lab Results  ?Component Value Date  ? HGBA1C 8.0 (A) 12/16/2021  ? HGBA1C 8.0 12/16/2021  ? HGBA1C 8.0 (A) 12/16/2021  ? HGBA1C 8.0 (A) 12/16/2021  ? ? ?   ?Assessment & Plan:  ? ?Problem List Items Addressed This Visit   ?None ?Visit Diagnoses   ? ? Type 2 diabetes mellitus without complication, without long-term current use of insulin ([place])    -  Primary  ? Relevant Medications  ? losartan-hydrochlorothiazide (HYZAAR) 50-12.5  MG tablet, refilled during visit   ? Other Relevant Orders  ? HgB A1c (Completed), 8.0, improved since previous visit, but not at goal   ? Primary hypertension      ? Relevant Medications  ? amLODipine (NORVASC) 5 MG ta

## 2021-12-17 NOTE — Telephone Encounter (Signed)
Attempted to call pt, LVM for call back  °

## 2021-12-18 NOTE — Congregational Nurse Program (Signed)
Home visit for BP check.  140/90.  No interpreter available for visit but patient understood  that BP was improved. He stated Case Manager Margurite AuerbachXuem Roughton visited today and explained how to take medication. However he does not have Atorvastatin or Omega 3.  CN will facilitate getting these for patient.  During visit [facility] which was requested by PCP T. [name].  Appointment scheduled for December 23, 2021 at 1:45 pm .  [facility]  Phone call to Case Manager who will arrange transportation.  Brantley Flingarolyn O'Brien RN, Congregational Nurse [phone]. ?

## 2021-12-19 ENCOUNTER — Other Ambulatory Visit: Payer: Self-pay

## 2021-12-23 ENCOUNTER — Other Ambulatory Visit: Payer: Self-pay

## 2021-12-23 ENCOUNTER — Ambulatory Visit (INDEPENDENT_AMBULATORY_CARE_PROVIDER_SITE_OTHER): Payer: Self-pay | Admitting: Orthopedic Surgery

## 2021-12-23 ENCOUNTER — Ambulatory Visit (INDEPENDENT_AMBULATORY_CARE_PROVIDER_SITE_OTHER): Payer: Self-pay

## 2021-12-23 DIAGNOSIS — M545 Low back pain, unspecified: Secondary | ICD-10-CM

## 2021-12-23 MED ORDER — NABUMETONE 750 MG PO TABS
750.0000 mg | ORAL_TABLET | Freq: Two times a day (BID) | ORAL | 0 refills | Status: DC | PRN
  Filled 2021-12-23: qty 60, 30d supply, fill #0

## 2021-12-25 ENCOUNTER — Other Ambulatory Visit: Payer: Self-pay

## 2021-12-25 NOTE — Congregational Nurse Program (Signed)
Home visit with interpreter Diu Hartshorn.  Reviewed patient's medications and explained purpose and frequency of each.  Interpreter wrote instructions in patient's language on each bottle. Filled pill box and will return in 1 week to teach patient how to fill it.  BP today 136/84.  [name] RN, Congregational Nurse [phone] ?

## 2022-01-01 NOTE — Congregational Nurse Program (Signed)
Home visit with interpreter Wayne Stokes.  Reviewed all medications with patient. Instructed him on how to fill pill box and helped him fill it.  He stated he feels like medicine is helping except for Naproxen which he hs been taking twice per day with no difference in pain.  States it actually makes him feel worse so he plans to stop it.  He is taking an OTC Glucosamine/Chondroitin/Omega 3 which he says helps with joint stiffness and pain.  BP today is 140/80.  Revisit in 1 week.  Wayne Fling RN, Congregational Nurse [phone] ?

## 2022-01-05 ENCOUNTER — Encounter: Payer: Self-pay | Admitting: Orthopedic Surgery

## 2022-01-05 NOTE — Progress Notes (Signed)
? ?Office Visit Note ?  ?Patient:             ?Date of Birth: 01/15/1966           ?MRN: [id number] ?Visit Date: 12/23/2021 ?             ?Requested by: Orion CrookPassmore, [place] I[place] ?[address] ?[place],  [place] [id number] ?PCP: Kathrynn SpeedPassmore, Tewana I, NP ? ?Chief Complaint  ?Patient presents with  ? Right Foot - Pain  ? Left Foot - Pain  ? ? ? ? ?HPI: ?Patient is a 56 year old gentleman who presents for initial evaluation for lower back pain and numbness in both feet.  Patient is diabetic.  Patient states the pain going down the legs is worse on the right.  Patient states the pain starts in the back and radiates to the right toes. ? ?Patient states he does not have a way to check his sugars. ? ?Assessment & Plan: ?Visit Diagnoses:  ?1. Acute low back pain, unspecified back pain laterality, unspecified whether sciatica present   ? ? ?Plan: We will try Relafen do not feel comfortable with prednisone with the patient's inability to check his glucose. ? ?Follow-Up Instructions: Return in about 4 weeks (around 01/20/2022).  ? ?Ortho Exam ? ?Patient is alert, oriented, no adenopathy, well-dressed, normal affect, normal respiratory effort. ?Examination patient has negative straight leg raise bilaterally and no focal motor weakness in either lower extremity.  No pain with range of motion of the hip knee or ankle. ? ?Imaging: ?No results found. ?No images are attached to the encounter. ? ?Labs: ?Lab Results  ?Component Value Date  ? HGBA1C 8.0 (A) 12/16/2021  ? HGBA1C 8.0 12/16/2021  ? HGBA1C 8.0 (A) 12/16/2021  ? HGBA1C 8.0 (A) 12/16/2021  ? ? ? ?Lab Results  ?Component Value Date  ? ALBUMIN 4.0 11/15/2021  ? ? ?No results found for: MG ?No results found for: VD25OH ? ?No results found for: PREALBUMIN ? ?  Latest Ref Rng & Units 12/05/2021  ?  5:51 PM 11/15/2021  ? 12:31 PM 07/03/2014  ?  8:15 PM  ?CBC EXTENDED  ?WBC 4.0 - 10.5 K/uL 11.8   8.3   7.6    ?RBC 4.22 - 5.81 MIL/uL 4.32   5.25   5.51    ?Hemoglobin 13.0 - 17.0 g/dL  84.610.6   96.212.9   95.214.1    ?HCT 39.0 - 52.0 % 32.9   40.2   41.5    ?Platelets 150 - 400 K/uL 404   256   237    ?NEUT# 1.7 - 7.7 K/uL 8.9   6.5   3.5    ?Lymph# 0.7 - 4.0 K/uL 1.6   1.0   3.0    ? ? ? ?There is no height or weight on file to calculate BMI. ? ?Orders:  ?Orders Placed This Encounter  ?Procedures  ? XR Lumbar Spine 2-3 Views  ? ?Meds ordered this encounter  ?Medications  ? nabumetone (RELAFEN) 750 MG tablet  ?  Sig: Take 1 tablet (750 mg total) by mouth 2 (two) times daily as needed for mild pain or moderate pain. with food  ?  Dispense:  60 tablet  ?  Refill:  0  ? ? ? Procedures: ?No procedures performed ? ?Clinical Data: ?No additional findings. ? ?ROS: ? ?All other systems negative, except as noted in the HPI. ?Review of Systems ? ?Objective: ?Vital Signs: There were no vitals taken for this visit. ? ?  Specialty Comments:  ?No specialty comments available. ? ?PMFS History: ?There are no problems to display for this patient. ? ?Past Medical History:  ?Diagnosis Date  ? Diabetes mellitus without complication (H[facility])   ? Hypertension   ?  ?Family History  ?Family history unknown: Yes  ?  ?No past surgical history on file. ?Social History  ? ?Occupational History  ? Not on file  ?Tobacco Use  ? Smoking status: Never  ? Smokeless tobacco: Never  ?Vaping Use  ? Vaping Use: Never used  ?Substance and Sexual Activity  ? Alcohol use: Never  ? Drug use: Never  ? Sexual activity: Not Currently  ? ? ? ? ? ?

## 2022-01-08 ENCOUNTER — Other Ambulatory Visit: Payer: Self-pay

## 2022-01-08 NOTE — Congregational Nurse Program (Signed)
Home visit with interpreter Jeris Penta assisting.  Patient had filled pill boxes without assistance.  He needed refills for 3 meds which was called in to [facility].  Case Manager Burach Bramson will make sure the are picked up and taken to patient.  BP today is 130/90.   ?

## 2022-01-09 ENCOUNTER — Other Ambulatory Visit: Payer: Self-pay

## 2022-01-20 ENCOUNTER — Encounter: Payer: Self-pay | Admitting: Orthopedic Surgery

## 2022-01-20 ENCOUNTER — Ambulatory Visit (INDEPENDENT_AMBULATORY_CARE_PROVIDER_SITE_OTHER): Payer: Self-pay | Admitting: Orthopedic Surgery

## 2022-01-20 DIAGNOSIS — M545 Low back pain, unspecified: Secondary | ICD-10-CM

## 2022-01-20 DIAGNOSIS — M5410 Radiculopathy, site unspecified: Secondary | ICD-10-CM

## 2022-01-20 NOTE — Progress Notes (Signed)
? ?Office Visit Note ?  ?Patient: Wayne Stokes           ?Date of Birth: 09/18/1965           ?MRN: [id number] ?Visit Date: 01/20/2022 ?             ?Requested by: [name], NP ?[address] ?[place],  [place] [address] ?PCP: Kathrynn Speed, NP ? ?Chief Complaint  ?Patient presents with  ? Lower Back - Pain  ? ? ? ? ?HPI: ?Patient is a 56 year old gentleman who is seen with his interpreter.  Patient complains of right buttocks pain radiating down to the right great toe.  Patient states the Relafen did not help.  Patient cannot take the prednisone due to his diabetes and the fact that he does not check his sugars. ? ?Assessment & Plan: ?Visit Diagnoses:  ?1. Acute low back pain, unspecified back pain laterality, unspecified whether sciatica present   ?2. Radicular pain of right lower extremity   ? ? ?Plan: We will order an MRI scan to further evaluate the radicular symptoms. ? ?Follow-Up Instructions: Return in about 2 weeks (around 02/03/2022).  ? ?Ortho Exam ? ?Patient is alert, oriented, no adenopathy, well-dressed, normal affect, normal respiratory effort. ?Examination patient has a normal gait.  He has a negative straight leg raise bilaterally no pain with range of motion of the hip knee or ankle.  Motor strength is symmetric in both lower extremities. ? ?Imaging: ?No results found. ?No images are attached to the encounter. ? ?Labs: ?Lab Results  ?Component Value Date  ? HGBA1C 8.0 (A) 12/16/2021  ? HGBA1C 8.0 12/16/2021  ? HGBA1C 8.0 (A) 12/16/2021  ? HGBA1C 8.0 (A) 12/16/2021  ? ? ? ?Lab Results  ?Component Value Date  ? ALBUMIN 4.0 11/15/2021  ? ? ?No results found for: MG ?No results found for: VD25OH ? ?No results found for: PREALBUMIN ? ?  Latest Ref Rng & Units 12/05/2021  ?  5:51 PM 11/15/2021  ? 12:31 PM 07/03/2014  ?  8:15 PM  ?CBC EXTENDED  ?WBC 4.0 - 10.5 K/uL 11.8   8.3   7.6    ?RBC 4.22 - 5.81 MIL/uL 4.32   5.25   5.51    ?Hemoglobin 13.0 - 17.0 g/dL 19.1   47.8   29.5    ?HCT 39.0 - 52.0 %  32.9   40.2   41.5    ?Platelets 150 - 400 K/uL 404   256   237    ?NEUT# 1.7 - 7.7 K/uL 8.9   6.5   3.5    ?Lymph# 0.7 - 4.0 K/uL 1.6   1.0   3.0    ? ? ? ?There is no height or weight on file to calculate BMI. ? ?Orders:  ?No orders of the defined types were placed in this encounter. ? ?No orders of the defined types were placed in this encounter. ? ? ? Procedures: ?No procedures performed ? ?Clinical Data: ?No additional findings. ? ?ROS: ? ?All other systems negative, except as noted in the HPI. ?Review of Systems ? ?Objective: ?Vital Signs: There were no vitals taken for this visit. ? ?Specialty Comments:  ?No specialty comments available. ? ?PMFS History: ?There are no problems to display for this patient. ? ?Past Medical History:  ?Diagnosis Date  ? Diabetes mellitus without complication ([facility])   ? Hypertension   ?  ?Family History  ?Family history unknown: Yes  ?  ?History reviewed. No  pertinent surgical history. ?Social History  ? ?Occupational History  ? Not on file  ?Tobacco Use  ? Smoking status: Never  ? Smokeless tobacco: Never  ?Vaping Use  ? Vaping Use: Never used  ?Substance and Sexual Activity  ? Alcohol use: Never  ? Drug use: Never  ? Sexual activity: Not Currently  ? ? ? ? ? ?

## 2022-01-29 NOTE — Congregational Nurse Program (Signed)
Home visit with interpreter Diu Hartshorn.  BP check 140/90.  Patient states he took medication this morning but was unsure if he is taking all medicine appropriately because new bottles did not have instructions written in his language. We filled pill box today and will revisit in 1 week to go over instructions and help him learn to fill his pill box.  Complained of having difficulty walking especially up steps due to sharp pain in right hip radiating down leg.  States it feels like a knife sticking in it and that gabapentin did not help.  He had glucosamine/chondroitin which helped but he is out and doesn't know how to get more and has no money to buy it.  CN will check on resources since he now has [employer]. CN will also notify PCP regarding ongoing hip/leg pain.  Brantley Fling RN, Congregational Nurse [phone].

## 2022-02-05 ENCOUNTER — Other Ambulatory Visit: Payer: Self-pay

## 2022-02-05 ENCOUNTER — Other Ambulatory Visit (HOSPITAL_COMMUNITY): Payer: Self-pay

## 2022-02-05 NOTE — Congregational Nurse Program (Addendum)
Home visit with interpreter Diu Hartshorn.  BP 180/96.  Patient states he did not take medication today or yesterday because he was not sure how to take it since pill box is now empty, although he should have had enough to last until today.  We had patient take all 3 BP meds. Stat and filled pill box  for 1 week, explaining purpose and dosage of each medicine.  Patient voiced understanding.  Will revisit in 1 week.  Advised him that he has appointment scheduled for 02/15/2022 at 4:00 pm for MRI of spine.  CN will notify case manager of appointment so that he can arrange transportation for patient. Patient was able to have friend supply him with Glucosamine/chondroitin supplement, which he says helps a lot with pain in right hip/leg.  Brantley Fling RN, Congregational Nurse [phone]

## 2022-02-12 ENCOUNTER — Other Ambulatory Visit: Payer: Self-pay

## 2022-02-12 NOTE — Congregational Nurse Program (Signed)
Home visit. BP elevated at 160/98.  Stated he has been medication as directed and took it this am. Will recheck in 1 week.  Filled pill box for 1 week.  Refill request called in for Amlodipine, Metoprolol, Gabapentin, Glipizide.  Patient continues to complain of left lower back and leg pain.  MRI rescheduled by case manager Margurite Auerbach for 02/25/2022 @ 12 noon due to transportation issue.  Brantley Fling RN, Congregational Nurse [phone]

## 2022-02-13 ENCOUNTER — Other Ambulatory Visit: Payer: Self-pay

## 2022-02-13 NOTE — Congregational Nurse Program (Signed)
Home visit to pick up prescriptions picked up from [facility] (Amlodipine, Metoprolol, Gabapentin and glipizide).  Added Gabapentin to pill box since he was completely out yesterday. Brantley Fling RN, Congregational Nurse [phone]

## 2022-02-15 ENCOUNTER — Other Ambulatory Visit: Payer: Medicaid Other

## 2022-02-19 NOTE — Congregational Nurse Program (Signed)
Home visit with interpreter Diu Hartshorn.  BP 110/68.  States he is feeling much better. Taking medication as ordered and was able to correctly fill his pill box this week.  Patient received latter from [employer] regarding Naturalization ceremony on February 26, 2022. Patient also stated that he has to move out of current apartment the end of June due to no more funding from [name] for rent.  Revisit in 1 week.  Brantley Fling RN, Congregational Nurse [phone].

## 2022-02-25 ENCOUNTER — Ambulatory Visit
Admission: RE | Admit: 2022-02-25 | Discharge: 2022-02-25 | Disposition: A | Discharge status: Home / Self Care | Payer: No Typology Code available for payment source | Source: Ambulatory Visit | Attending: Orthopedic Surgery | Admitting: Orthopedic Surgery

## 2022-02-25 DIAGNOSIS — M5410 Radiculopathy, site unspecified: Secondary | ICD-10-CM

## 2022-02-25 DIAGNOSIS — M545 Low back pain, unspecified: Secondary | ICD-10-CM

## 2022-03-05 NOTE — Congregational Nurse Program (Signed)
Home visit with interpreter Diu Hartshorn.  BP check 126/84.  Taking medicines as ordered except states that Nabumetone does not help pain in right hip and leg. It actually seems to make it worse.  Advised him that he has PCP follow-up appointment on 02/15/2022 and he should discuss pain medicine at that visit.  Brantley Fling RN, Congregational Nurse [phone].

## 2022-03-07 ENCOUNTER — Telehealth: Payer: Self-pay

## 2022-03-07 NOTE — Telephone Encounter (Signed)
-----   Message from Nadara Mustard, MD sent at 02/26/2022  3:18 PM EDT ----- Lets have patient follow-up with Dr. Alvester Morin. ----- Message ----- From: Interface, Rad Results In Sent: 02/26/2022   2:15 PM EDT To: Nadara Mustard, MD

## 2022-03-07 NOTE — Telephone Encounter (Signed)
I have tried to speak with pt and there is a language barrier and he does not understand english. I have left messages for the friend listed in the pts chart in hopes that he speaks english and my be able to translate. I have called the [employer] interpre[employer] line and unable to get selection needed through voice prompts. I have spoken with management and advised to ask for the language jarai only and this does not come up as option when I call interpreter again. I have tried for the past  week to find some way of communication with the patient.

## 2022-03-12 NOTE — Congregational Nurse Program (Signed)
Home visit with interpreter Diu Hartshorn.  BP 126/88.  Taking medication as ordered.  States he will be out of medicine in a few days. Developed a rash (? Petechiae) on left arm about 4 days ago after "massaging" the area.  No history of similar problem. Does not itch or hurt. Advised to show PCP at appointment on 03/17/2022.  Also received a medical bill and Food Stamp renewal application.  Patient's case manager Margurite Auerbach advised that patient requesting help getting his medicine, transportation to 07/10 appointment and with food stamps and bill.  Brantley Fling RN, Congregational Nurse [phone]

## 2022-03-13 ENCOUNTER — Other Ambulatory Visit (HOSPITAL_COMMUNITY): Payer: Self-pay

## 2022-03-13 ENCOUNTER — Other Ambulatory Visit: Payer: Self-pay | Admitting: Nurse Practitioner

## 2022-03-13 DIAGNOSIS — I10 Essential (primary) hypertension: Secondary | ICD-10-CM

## 2022-03-13 MED ORDER — AMLODIPINE BESYLATE 5 MG PO TABS
5.0000 mg | ORAL_TABLET | Freq: Every day | ORAL | 2 refills | Status: DC
  Filled 2022-03-13 – 2022-03-14 (×2): qty 30, 30d supply, fill #0
  Filled 2022-04-14 (×2): qty 30, 30d supply, fill #1

## 2022-03-13 MED ORDER — METOPROLOL SUCCINATE ER 25 MG PO TB24
25.0000 mg | ORAL_TABLET | Freq: Every day | ORAL | 2 refills | Status: DC
  Filled 2022-03-13 – 2022-03-17 (×3): qty 30, 30d supply, fill #0
  Filled 2022-04-14 (×2): qty 30, 30d supply, fill #1
  Filled 2022-05-15 – 2022-05-19 (×2): qty 30, 30d supply, fill #2

## 2022-03-14 ENCOUNTER — Other Ambulatory Visit (HOSPITAL_COMMUNITY): Payer: Self-pay

## 2022-03-14 ENCOUNTER — Other Ambulatory Visit: Payer: Self-pay

## 2022-03-17 ENCOUNTER — Other Ambulatory Visit: Payer: Self-pay

## 2022-03-17 ENCOUNTER — Encounter: Payer: Self-pay | Admitting: Nurse Practitioner

## 2022-03-17 ENCOUNTER — Ambulatory Visit (INDEPENDENT_AMBULATORY_CARE_PROVIDER_SITE_OTHER): Payer: No Typology Code available for payment source | Admitting: Nurse Practitioner

## 2022-03-17 VITALS — BP 123/91 | HR 79 | Temp 98.7°F | Ht 60.0 in | Wt 138.0 lb

## 2022-03-17 DIAGNOSIS — R21 Rash and other nonspecific skin eruption: Secondary | ICD-10-CM

## 2022-03-17 DIAGNOSIS — E119 Type 2 diabetes mellitus without complications: Secondary | ICD-10-CM | POA: Insufficient documentation

## 2022-03-17 LAB — POCT GLYCOSYLATED HEMOGLOBIN (HGB A1C): Hemoglobin A1C: 7.4 % — AB (ref 4.0–5.6)

## 2022-03-17 MED ORDER — DOXYCYCLINE HYCLATE 100 MG PO TABS
100.0000 mg | ORAL_TABLET | Freq: Two times a day (BID) | ORAL | 0 refills | Status: AC
  Filled 2022-03-17: qty 20, 10d supply, fill #0

## 2022-03-17 MED ORDER — GLIPIZIDE 5 MG PO TABS
5.0000 mg | ORAL_TABLET | Freq: Two times a day (BID) | ORAL | 3 refills | Status: DC
  Filled 2022-03-17: qty 60, 30d supply, fill #0

## 2022-03-17 MED ORDER — CLOTRIMAZOLE-BETAMETHASONE 1-0.05 % EX CREA
1.0000 | TOPICAL_CREAM | Freq: Every day | CUTANEOUS | 0 refills | Status: DC
Start: 2022-03-17 — End: 2022-08-01
  Filled 2022-03-17: qty 15, 7d supply, fill #0

## 2022-03-17 NOTE — Patient Instructions (Addendum)
1. Type 2 diabetes mellitus without complication, without long-term current use of insulin (HCC)  - POCT glycosylated hemoglobin (Hb A1C) - glipiZIDE (GLUCOTROL) 5 MG tablet; Take 1 tablet (5 mg total) by mouth 2 (two) times daily before a meal.  Dispense: 60 tablet; Refill: 3  Lab Results  Component Value Date   HGBA1C 7.4 (A) 03/17/2022     2. Rash  - clotrimazole-betamethasone (LOTRISONE) cream; Apply 1 Application topically daily.  Dispense: 30 g; Refill: 0 - doxycycline (VIBRA-TABS) 100 MG tablet; Take 1 tablet (100 mg total) by mouth 2 (two) times daily for 10 days.  Dispense: 20 tablet; Refill: 0  Follow up:  Follow up in 3 months

## 2022-03-17 NOTE — Progress Notes (Signed)
@Patient  ID: , male    DOB: 09/05/1966, 56 y.o.   MRN: [id number]  Chief Complaint  Patient presents with   Follow-up    3 month follow up, still having pain in leg and  knee burning, follow up dm medication , taking every day no missed dose     Referring provider: [id number] I, NP   HPI   Wayne Stokes 56 y.o. male  has a past medical history of Diabetes mellitus without complication (HCC) and Hypertension. To the [facility] for reevaluation of HTN and DM.  Patient presents today for a follow-up on diabetes and hypertension.  He states that he is compliant with medications.  He does complain today of itching and redness to his lower extremities.  He has been to see orthopedics and is scheduled for a neurology appointment for this pain.  We will trial antibiotic and for his leg for the itching. Denies f/c/s, n/v/d, hemoptysis, PND, leg swelling Denies chest pain or edema      Allergies  Allergen Reactions   Beef-Derived Products Swelling   Pork-Derived Products Swelling   Shellfish Allergy Rash   Eggs Or Egg-Derived Products Swelling   Other Other (See Comments)    Egg plant , pizza - swelling    Immunization History  Administered Date(s) Administered   Influenza,inj,Quad PF,6+ Mos 06/26/2021   PFIZER(Purple Top)SARS-COV-2 Vaccination 03/15/2020, 04/07/2020    Past Medical History:  Diagnosis Date   Diabetes mellitus without complication (HCC)    Hypertension     Tobacco History: Social History   Tobacco Use  Smoking Status Never  Smokeless Tobacco Never   Counseling given: Not Answered   Outpatient Encounter Medications as of 03/17/2022  Medication Sig   amLODipine (NORVASC) 5 MG tablet Take 1 tablet (5 mg total) by mouth daily.   atorvastatin (LIPITOR) 40 MG tablet Take 1 tablet (40 mg total) by mouth daily.   clotrimazole-betamethasone (LOTRISONE) cream Apply 1 Application topically daily.   donepezil (ARICEPT) 5 MG tablet Take 1 tablet (5 mg total) by  mouth at bedtime.   doxycycline (VIBRA-TABS) 100 MG tablet Take 1 tablet (100 mg total) by mouth 2 (two) times daily for 10 days.   gabapentin (NEURONTIN) 300 MG capsule Take 1 capsule (300 mg total) by mouth 3 (three) times daily.   losartan-hydrochlorothiazide (HYZAAR) 50-12.5 MG tablet Take 1 tablet by mouth daily.   metFORMIN (GLUCOPHAGE) 500 MG tablet Take 1 tablet (500 mg total) by mouth 2 (two) times daily with a meal.   metoprolol succinate (TOPROL-XL) 25 MG 24 hr tablet Take 1 tablet (25 mg total) by mouth daily.   [DISCONTINUED] glipiZIDE (GLUCOTROL) 5 MG tablet Take 1 tablet (5 mg total) by mouth 2 (two) times daily before a meal.   [DISCONTINUED] nabumetone (RELAFEN) 750 MG tablet Take 1 tablet (750 mg total) by mouth 2 (two) times daily as needed for mild pain or moderate pain. with food   glipiZIDE (GLUCOTROL) 5 MG tablet Take 1 tablet (5 mg total) by mouth 2 (two) times daily before a meal.   Omega 3 1000 MG CAPS Take 1,000 mg by mouth daily. (Patient not taking: Reported on 03/17/2022)   [DISCONTINUED] meclizine (ANTIVERT) 25 MG tablet Take 1 tablet (25 mg total) by mouth 3 (three) times daily as needed.   No facility-administered encounter medications on file as of 03/17/2022.     Review of Systems  Review of Systems  Constitutional: Negative.   HENT: Negative.    Cardiovascular: Negative.  Gastrointestinal: Negative.   Allergic/Immunologic: Negative.   Neurological: Negative.   Psychiatric/Behavioral: Negative.         Physical Exam  BP (!) 123/91   Pulse 79   Temp 98.7 F (37.1 C)   Ht 5' (1.524 m)   Wt 138 lb (62.6 kg)   SpO2 99%   BMI 26.95 kg/m   Wt Readings from Last 5 Encounters:  03/17/22 138 lb (62.6 kg)  12/16/21 138 lb (62.6 kg)  12/12/21 138 lb 8 oz (62.8 kg)  11/15/21 135 lb 3.2 oz (61.3 kg)     Physical Exam Vitals and nursing note reviewed.  Constitutional:      General: He is not in acute distress.    Appearance: He is  well-developed.  Cardiovascular:     Rate and Rhythm: Normal rate and regular rhythm.  Pulmonary:     Effort: Pulmonary effort is normal.     Breath sounds: Normal breath sounds.  Skin:    General: Skin is warm and dry.  Neurological:     Mental Status: He is alert and oriented to person, place, and time.      Lab Results:  CBC    Component Value Date/Time   WBC 11.8 (H) 12/05/2021 1751   RBC 4.32 12/05/2021 1751   HGB 10.6 (L) 12/05/2021 1751   HGB 12.9 (L) 11/15/2021 1231   HCT 32.9 (L) 12/05/2021 1751   HCT 40.2 11/15/2021 1231   PLT 404 (H) 12/05/2021 1751   PLT 256 11/15/2021 1231   MCV 76.2 (L) 12/05/2021 1751   MCV 77 (L) 11/15/2021 1231   MCH 24.5 (L) 12/05/2021 1751   MCHC 32.2 12/05/2021 1751   RDW 14.2 12/05/2021 1751   RDW 15.0 11/15/2021 1231   LYMPHSABS 1.6 12/05/2021 1751   LYMPHSABS 1.0 11/15/2021 1231   MONOABS 1.0 12/05/2021 1751   EOSABS 0.1 12/05/2021 1751   EOSABS 0.0 11/15/2021 1231   BASOSABS 0.0 12/05/2021 1751   BASOSABS 0.0 11/15/2021 1231    BMET    Component Value Date/Time   NA 130 (L) 12/05/2021 1751   NA 132 (L) 11/15/2021 1231   K 4.0 12/05/2021 1751   CL 96 (L) 12/05/2021 1751   CO2 25 12/05/2021 1751   GLUCOSE 88 12/05/2021 1751   BUN 17 12/05/2021 1751   BUN 12 11/15/2021 1231   CREATININE 1.27 (H) 12/05/2021 1751   CALCIUM 9.1 12/05/2021 1751   GFRNONAA >60 12/05/2021 1751   GFRAA >90 07/03/2014 2015    BNP No results found for: "BNP"  ProBNP No results found for: "PROBNP"  Imaging: MR Lumbar Spine w/o contrast  Result Date: 02/26/2022 CLINICAL DATA:  Acute low back pain, unspecified back pain laterality, unspecified whether sciatica present M54.50 (ICD-10-CM). Low back pain, symptoms persist with > 6 wks treatment. Radicular pain of right lower extremity M54.10 (ICD-10-CM). EXAM: MRI LUMBAR SPINE WITHOUT CONTRAST TECHNIQUE: Multiplanar, multisequence MR imaging of the lumbar spine was performed. No intravenous  contrast was administered. COMPARISON:  Radiographs December 23, 2021. FINDINGS: Segmentation: A transitional lumbosacral vertebra is assumed to represent a partially sacralized L5 level. Careful correlation with this numbering strategy prior to any procedural intervention would be recommended. d. Alignment:  Grade 1 anterolisthesis of L4 over L5. Vertebrae: No fracture, evidence of discitis, or bone lesion. Endplate degenerative changes at L4-5. Congenitally small spinal canal. Conus medullaris and cauda equina: Conus extends to the L1-2 level. Conus and cauda equina appear normal. Paraspinal and other soft tissues: Negative. Disc  levels: T12-L1: No significant spinal canal or neural foraminal stenosis. L1-2: Disc bulge and mild facet degenerative changes with associated epidural lipomatosis resulting in mild spinal canal stenosis and mild left neural foraminal narrowing. L2-3: Disc bulge, mild to moderate facet degenerative changes and epidural lipomatosis resulting in moderate spinal canal stenosis and mild left neural foraminal narrowing. L3-4: Disc bulge, mild-to-moderate facet degenerative changes and epidural lipomatosis resulting in moderate spinal canal stenosis, mild right and moderate left neural neural. L4-5: Anterolisthesis, disc bulge with superimposed right foraminal disc protrusion and advanced facet degenerative changes with ligamentum flavum redundancy resulting in severe spinal canal stenosis, severe right and moderate left neural foraminal narrowing. L5-S1: No spinal canal or neural foraminal stenosis. IMPRESSION: 1. 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 2. 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 3. Severe right and moderate left neural foraminal narrowing at L4-5. Electronically Signed   By: Baldemar Lenis M.D.   On: 02/26/2022 14:12     Assessment & Plan:   Type 2 diabetes mellitus without complication, without long-term current use of insulin (HCC) - POCT glycosylated hemoglobin (Hb A1C) - glipiZIDE (GLUCOTROL) 5 MG tablet; Take 1 tablet (5 mg total) by mouth 2 (two) times daily before a meal.  Dispense: 60 tablet; Refill: 3  Lab Results  Component Value Date   HGBA1C 7.4 (A) 03/17/2022     2. Rash  - clotrimazole-betamethasone (LOTRISONE) cream; Apply 1 Application topically daily.  Dispense: 30 g; Refill: 0 - doxycycline (VIBRA-TABS) 100 MG tablet; Take 1 tablet (100 mg total) by mouth 2 (two) times daily for 10 days.  Dispense: 20 tablet; Refill: 0  Follow up:  Follow up in 3 months     Ivonne Andrew, NP 03/17/2022

## 2022-03-17 NOTE — Assessment & Plan Note (Signed)
-   POCT glycosylated hemoglobin (Hb A1C) - glipiZIDE (GLUCOTROL) 5 MG tablet; Take 1 tablet (5 mg total) by mouth 2 (two) times daily before a meal.  Dispense: 60 tablet; Refill: 3  Lab Results  Component Value Date   HGBA1C 7.4 (A) 03/17/2022     2. Rash  - clotrimazole-betamethasone (LOTRISONE) cream; Apply 1 Application topically daily.  Dispense: 30 g; Refill: 0 - doxycycline (VIBRA-TABS) 100 MG tablet; Take 1 tablet (100 mg total) by mouth 2 (two) times daily for 10 days.  Dispense: 20 tablet; Refill: 0  Follow up:  Follow up in 3 months

## 2022-03-17 NOTE — Congregational Nurse Program (Signed)
Picked up prescriptions from [facility] and took to patient.  Filled pill box.  Interpreter Wayne Stokes explained he had a new prescription from PCP visit this morning for antibiotic doxycycline which is to be taken twice a day.  Also told him that the ointment which was prescribed for his rash was  not available today but will be picked up tomorrow.Patient expressed anxiety over possibly being homeless tomorrow. [name] was unable to continue paying rent and he has been told to move out.  Case Manager Wayne Stokes stated that  Wayne Stokes mentioned to him that he should kill himself due financial and housing problems.  CN advised that patient should be taken to [facility] for emergency evaluation.  Two [name] workers went to apartment and he was not home and not answering phone.  He was eventually located at a friend's house.  Wayne Fling RN, Congregational Nurse [phone]

## 2022-03-18 ENCOUNTER — Other Ambulatory Visit: Payer: Self-pay

## 2022-03-18 NOTE — Congregational Nurse Program (Signed)
Home visit with interpreters Margurite Auerbach and Diu Hartshorn via phone.  Gave patient Lotrisone cream which was picked up from [facility].  Instructed him to use it once a day and he applied to rash on both legs.  Expressed concern for patient after telling Xuem yesterday that he wanted to kill himself because of lack of housing and financial situation.  Stated he did not sleep last night and he is very distraught about situation.  When asked if he had a plan he said he was going to take an overdose of his prescription medications.  Diu and I talked to him about possible solution to housing issues.  Another [place] gentleman has agreed to let Hermann move in with him but wants to meet with him tomorrow to discuss rules he must follow.  Diu contacted Nivaan"s sister-in-law in [place] to see if she could help with rent.  If not CN will apply for assistance through the [facility].  Safety plan established with patient and he agreed not to harm himself.  He was crying and very happy about a possible solution to not having housing.  Will home visit tomorrow with Diu.  Brantley Fling RN, Congregational Nurse [phone]

## 2022-03-19 NOTE — Congregational Nurse Program (Signed)
Home visit with interpreter Wayne Stokes.  BP 138/74.  Taking meds as ordered.  Patient met with follow [place] today and they agreed that Wayne Stokes could move into his apartment and that they would split everything 50/50.  CN will apply for assistance through the [facility].  Wayne Stokes from [name] will help complete paperwork for SSA disability (he received letter regarding current level pf functionality.) Currently having a lot of pain in right hip and leg.  Asking how soon he can see doctor.  CN will follow-up tomorrow on plan of care.  Also messaged [name] and [employer] caseworker regarding patient needing help to move.  [name] RN, Congregational nurse [phone]

## 2022-03-24 ENCOUNTER — Telehealth: Payer: Self-pay

## 2022-03-24 NOTE — Telephone Encounter (Signed)
Phone call to [facility]. Scheduled follow-up appointment with Dr. Lajoyce Corners on August 1 @ 1:00 pm.  Brantley Fling RN, Congregational Nurse [phone]

## 2022-04-02 NOTE — Congregational Nurse Program (Signed)
Home visit with interpreter Diu Hartshorn.  Patient complains of severe left hip and leg pain with pain level of 10.  BP 148/102.  He is taking medications as ordered. Advised patient he has appointment 04/08/2022 at 1:00 with neurologist regarding pain and that transportation will be arranged.  Brantley Fling RN, Congregational Nurse [phone].

## 2022-04-03 DIAGNOSIS — Z0271 Encounter for disability determination: Secondary | ICD-10-CM

## 2022-04-08 ENCOUNTER — Other Ambulatory Visit: Payer: Self-pay

## 2022-04-08 ENCOUNTER — Ambulatory Visit (INDEPENDENT_AMBULATORY_CARE_PROVIDER_SITE_OTHER): Payer: Self-pay | Admitting: Orthopedic Surgery

## 2022-04-08 ENCOUNTER — Telehealth: Payer: Self-pay | Admitting: Physical Medicine and Rehabilitation

## 2022-04-08 ENCOUNTER — Encounter: Payer: Self-pay | Admitting: Orthopedic Surgery

## 2022-04-08 DIAGNOSIS — M5410 Radiculopathy, site unspecified: Secondary | ICD-10-CM

## 2022-04-08 MED ORDER — PREDNISONE 10 MG PO TABS
10.0000 mg | ORAL_TABLET | Freq: Every day | ORAL | 0 refills | Status: DC
  Filled 2022-04-08: qty 30, 30d supply, fill #0

## 2022-04-08 NOTE — Congregational Nurse Program (Signed)
CN accompanied patient to Orthopedic appointment with Dr. Lajoyce Corners with [facility].  Patient having severe pain in lower back radiating down both legs.  Difficulty walking and sitting.  Dr. Lajoyce Corners stated that he needs to be seen by Dr. Alvester Morin for an injection.  First available appointment is April 24, 2022 @ 2:30 pm. CN asked Dr. Lajoyce Corners per request of Case Manager Margurite Auerbach if he could write a letter to Social Security regarding patient's condition to help expedite his disability application.  He declined to do so because he didn't believe it would be beneficial.  Prednisone prescribed contingent upon patient checking blood sugars each morning prior to taking medication.  If blood sugar is above 200 he should notify doctor. Patient had previously told CN that heat helped.  Dr. Lajoyce Corners stated he would be okay to use a heating pad.  CN picked up a walker, heating pad and his medication and took it to patient. Editor, commissioning Long assisted in explaining use of these.)  Will visit tomorrow morning at 8:30 to check blood sugar and in the afternoon with Interpreter Diu Hartshorn to teach patient how to check his own blood sugar.  Brantley Fling RN, Congregational Nurse [phone]

## 2022-04-08 NOTE — Telephone Encounter (Signed)
Referral from Dr. Lajoyce Corners for lumbar ESI. Dr. Lajoyce Corners would like him scheduled today as he has interpreter present. I placed note in referral for right L4 transforaminal epidural steroid injection. Do not think patients insurance requires prior authorization.

## 2022-04-08 NOTE — Progress Notes (Signed)
Office Visit Note   Patient: Wayne Stokes           Date of Birth: [birth year]           MRN: [id number] Visit Date: 04/08/2022              Requested by: [name], NP No address on file PCP: Kathrynn Speed, NP  Chief Complaint  Patient presents with   Lower Back - Follow-up    MRI review L spine       HPI: Patient is a 56 year old gentleman with lumbar spine ongoing pain with radicular symptoms down both lower extremities.  Patient is status post MRI scan of his lumbar spine.  Assessment & Plan: Visit Diagnoses:  1. Radicular pain of right lower extremity     Plan: We will set up a follow-up appointment with Dr. Alvester Morin before patient leaves the office due to difficulty obtaining an interpreter.  Patient has had relief with heat and recommended heating pad.  Patient support states that they will stop by and check his blood glucose and we will try low-dose prednisone 10 mg with breakfast.  Follow-Up Instructions: Return if symptoms worsen or fail to improve, for Follow-up with Dr. Alvester Morin after the MRI scan.Gaylord Shih Exam  Patient is alert, oriented, no adenopathy, well-dressed, normal affect, normal respiratory effort.  Patient has bilateral buttock pain worse on the right than the left with radicular pain down the lateral side of both legs negative straight leg raise bilaterally no focal motor weakness in either lower extremity.  Patient's MRI scan shows congenitally small spinal canal stenosis with associated epidural lipomatosis with most severe spinal canal stenosis at L4-5 and severe right and moderate left neuroforaminal narrowing at L4-5.  Patient is diabetic and states that he does not check his glucose.  Patient has tried Neurontin without relief.   Imaging: No results found. No images are attached to the encounter.  Labs: Lab Results  Component Value Date   HGBA1C 7.4 (A) 03/17/2022   HGBA1C 8.0 (A) 12/16/2021   HGBA1C 8.0 12/16/2021   HGBA1C 8.0 (A)  12/16/2021   HGBA1C 8.0 (A) 12/16/2021     Lab Results  Component Value Date   ALBUMIN 4.0 11/15/2021    No results found for: "MG" No results found for: "VD25OH"  No results found for: "PREALBUMIN"    Latest Ref Rng & Units 12/05/2021    5:51 PM 11/15/2021   12:31 PM 07/03/2014    8:15 PM  CBC EXTENDED  WBC 4.0 - 10.5 K/uL 11.8  8.3  7.6   RBC 4.22 - 5.81 MIL/uL 4.32  5.25  5.51   Hemoglobin 13.0 - 17.0 g/dL 08.6  57.8  46.9   HCT 39.0 - 52.0 % 32.9  40.2  41.5   Platelets 150 - 400 K/uL 404  256  237   NEUT# 1.7 - 7.7 K/uL 8.9  6.5  3.5   Lymph# 0.7 - 4.0 K/uL 1.6  1.0  3.0      There is no height or weight on file to calculate BMI.  Orders:  Orders Placed This Encounter  Procedures   Ambulatory referral to Physical Medicine Rehab   Meds ordered this encounter  Medications   predniSONE (DELTASONE) 10 MG tablet    Sig: Take 1 tablet (10 mg total) by mouth daily with breakfast.    Dispense:  30 tablet    Refill:  0     Procedures: No procedures  performed  Clinical Data: No additional findings.  ROS:  All other systems negative, except as noted in the HPI. Review of Systems  Objective: Vital Signs: There were no vitals taken for this visit.  Specialty Comments:  EXAM: MRI LUMBAR SPINE WITHOUT CONTRAST   TECHNIQUE: Multiplanar, multisequence MR imaging of the lumbar spine was performed. No intravenous contrast was administered.   COMPARISON:  Radiographs December 23, 2021.   FINDINGS: Segmentation: A transitional lumbosacral vertebra is assumed to represent a partially sacralized L5 level. Careful correlation with this numbering strategy prior to any procedural intervention would be recommended. d.   Alignment:  Grade 1 anterolisthesis of L4 over L5.   Vertebrae: No fracture, evidence of discitis, or bone lesion. Endplate degenerative changes at L4-5. Congenitally small spinal canal.   Conus medullaris and cauda equina: Conus extends to the  L1-2 level. Conus and cauda equina appear normal.   Paraspinal and other soft tissues: Negative.   Disc levels:   T12-L1: No significant spinal canal or neural foraminal stenosis.   L1-2: Disc bulge and mild facet degenerative changes with associated epidural lipomatosis resulting in mild spinal canal stenosis and mild left neural foraminal narrowing.   L2-3: Disc bulge, mild to moderate facet degenerative changes and epidural lipomatosis resulting in moderate spinal canal stenosis and mild left neural foraminal narrowing.   L3-4: Disc bulge, mild-to-moderate facet degenerative changes and epidural lipomatosis resulting in moderate spinal canal stenosis, mild right and moderate left neural neural.   L4-5: Anterolisthesis, disc bulge with superimposed right foraminal disc protrusion and advanced facet degenerative changes with ligamentum flavum redundancy resulting in severe spinal canal stenosis, severe right and moderate left neural foraminal narrowing.   L5-S1: No spinal canal or neural foraminal stenosis.   IMPRESSION: 1. 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 2. 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 3. Severe right and moderate left neural foraminal narrowing at L4-5.     Electronically Signed   By: Baldemar Lenis M.D.   On: 02/26/2022 14:12  PMFS History: Patient Active Problem List   Diagnosis Date Noted   Type 2 diabetes mellitus without complication, without long-term current use of insulin (HCC) 03/17/2022   Past Medical History:  Diagnosis Date   Diabetes mellitus without complication (HCC)    Hypertension     Family History  Family history unknown: Yes    History reviewed. No pertinent surgical history. Social History   Occupational History    Not on file  Tobacco Use   Smoking status: Never   Smokeless tobacco: Never  Vaping Use   Vaping Use: Never used  Substance and Sexual Activity   Alcohol use: Never   Drug use: Never   Sexual activity: Not Currently

## 2022-04-09 LAB — GLUCOSE, POCT (MANUAL RESULT ENTRY): POC Glucose: 198 mg/dl — AB (ref 70–99)

## 2022-04-09 NOTE — Congregational Nurse Program (Signed)
Home visit to check blood glucose.  Patient had already eaten and taken daily meds.  Blood glucose 193. Advised patient he could start Prednisone.  Will revisit with interpreter this afternoon to recheck BG.  Brantley Fling RN, Congregational Nurse [phone]

## 2022-04-09 NOTE — Congregational Nurse Program (Signed)
Home visit with interpreter Diu Hartshorn and Med. Student Dayna Barker.  Shanda Bumps educated patient on his back problems based on the MRI report and explained upcoming injection scheduled for 04/24/2022. CN rechecked blood sugar with new glucometer purchased for patient.  Results 178 approximately 3 hours after eating.  Patient's friend was present and agreed to watch instruction on performing testing.  Patient reluctant to stick himself at present and friend said he will help. CN will visit again tomorrow. Checked his pill box which he has been filling and determined that patient has not been taking Metformin properly. (There was none in any of the evening slots.) CN corrected dosage and interpreter reviewed instructions and purpose of all meds.  CN will contact PCP regarding patient starting prednisone.  Brantley Fling RN, Congregational Nurse [phone]

## 2022-04-10 NOTE — Congregational Nurse Program (Signed)
Home visit to check blood sugar. Patient ate approximately 4 hours ago.  BG is 155.  Advised patient he could take prednisone.  Brantley Fling RN, Congregational Nurse [phone]

## 2022-04-10 NOTE — Congregational Nurse Program (Signed)
Home visit to check blood sugar. Patient had not taken medications this morning or yesterday evening.  Called interpreter Diu Hartshorn who said patient did not take his medicine because his stomach felt sour and wasn't sure if he should take it.  We advised him to always take as prescribed. He took am dose and CN advised she would return around 2:00 pm to check blood sugar.  Brantley Fling RN, Congregational Nurse [phone]

## 2022-04-11 NOTE — Congregational Nurse Program (Signed)
Home visit.  Blood glucose is 132 non-fasting.  He had already taken morning medications except prednisone, which he took at 11:00 am.  Advised to take it with breakfast the next 2 days (Saturday and Sunday). CN will return on Monday to recheck blood sugar. Still complaining of back pain especially when ambulatory.  Brantley Fling RN, Congregational Nurse [phone]

## 2022-04-14 ENCOUNTER — Other Ambulatory Visit (HOSPITAL_COMMUNITY): Payer: Self-pay

## 2022-04-14 ENCOUNTER — Other Ambulatory Visit: Payer: Self-pay

## 2022-04-14 NOTE — Congregational Nurse Program (Signed)
Home visit.  BP check 158/88.  Blood glucose 170 non-fasting.  Taking medications as ordered.  States he is sleeping but continues to have "too much" back pain especially when walking.  Patient got up and went to kitchen without using walker.  Encouraged to use it and when he did he stated his back felt much better.  Took wheelchair to patient for which he was very Adult nurse.  Brantley Fling RN, Congregational Nurse [phone]

## 2022-04-16 ENCOUNTER — Other Ambulatory Visit: Payer: Self-pay

## 2022-04-16 NOTE — Congregational Nurse Program (Addendum)
Home visit with interpreter Diu Hartshorn. Patient was outside ambulating leaning on his wheelchair.  When he returned inside c/o buttocks pain when transferring to bed. CN took the following medications to patient which were picked up from [facility] at [facility]:  Amlodipine 5 mg, metoprolol succinate 25 mg, and losartan-hctz 50/12.5.  Has been taking medications as ordered including prednisone.  States he does not feel like prednisone is helping.  Encouraged him to keep taking it and reminded him that his injection is scheduled 1 week from tomorrow.  Blood glucose is 106 today.  Return in 2 days to recheck.  Brantley Fling RN, Congregational Nurse [phone]

## 2022-04-18 NOTE — Congregational Nurse Program (Signed)
Home visit.  Blood glucose 199 and BP 140/88.  He did not take pm meds yesterday or am meds today.  Stressed importance of taking them as ordered.  States he does not think prednisone is helping back/leg pain.  Reminded him of epidural injections scheduled on 04/24/2022.  Brantley Fling RN, congregational nurse [phone]

## 2022-04-21 NOTE — Congregational Nurse Program (Signed)
Home visit to help patient check blood sugar.  Today's reading is 98.  Taking medication as ordered.  He states that he does not feel like prednisone is helping lower back and leg pain.  Reminded him of injection appointment on 04/24/2022.  Brantley Fling RN, Congregational Nurse [phone]

## 2022-04-24 ENCOUNTER — Encounter: Payer: Self-pay | Admitting: Physical Medicine and Rehabilitation

## 2022-04-24 ENCOUNTER — Ambulatory Visit (INDEPENDENT_AMBULATORY_CARE_PROVIDER_SITE_OTHER): Payer: Self-pay | Admitting: Physical Medicine and Rehabilitation

## 2022-04-24 ENCOUNTER — Ambulatory Visit: Payer: Self-pay

## 2022-04-24 DIAGNOSIS — M48062 Spinal stenosis, lumbar region with neurogenic claudication: Secondary | ICD-10-CM

## 2022-04-24 DIAGNOSIS — M5416 Radiculopathy, lumbar region: Secondary | ICD-10-CM

## 2022-04-24 MED ORDER — METHYLPREDNISOLONE ACETATE 80 MG/ML IJ SUSP
40.0000 mg | Freq: Once | INTRAMUSCULAR | Status: AC
  Administered 2022-04-24: 40 mg

## 2022-04-24 NOTE — Progress Notes (Signed)
Pt state lower back pain that travels down both legs. Pt state walking, standing and sitting makes the pain worse. Pt state he takes over the counter painmeds to help ease his pain.  Numeric Pain Rating Scale and Functional Assessment Average Pain 7   In the last MONTH (on 0-10 scale) has pain interfered with the following?  1. General activity like being  able to carry out your everyday physical activities such as walking, climbing stairs, carrying groceries, or moving a chair?  Rating(7)   +Driver, -BT, -Dye Allergies.

## 2022-04-24 NOTE — Patient Instructions (Signed)

## 2022-04-24 NOTE — Procedures (Signed)
Lumbar Epidural Steroid Injection - Interlaminar Approach with Fluoroscopic Guidance  Patient: Wayne Stokes      Date of Birth: [birth year] MRN: [id number] PCP: [name], NP      Visit Date: 04/24/2022   Universal Protocol:     Consent Given By: the patient  Position: PRONE  Additional Comments: Vital signs were monitored before and after the procedure. Patient was prepped and draped in the usual sterile fashion. The correct patient, procedure, and site was verified.   Injection Procedure Details:   Procedure diagnoses: Lumbar radiculopathy [M54.16]   Meds Administered:  Meds ordered this encounter  Medications   methylPREDNISolone acetate (DEPO-MEDROL) injection 40 mg     Laterality: Right  Location/Site:  L3-4  Needle: 3.5 in., 20 ga. Tuohy  Needle Placement: Paramedian epidural  Findings:   -Comments: Excellent flow of contrast into the epidural space.  Procedure Details: Using a paramedian approach from the side mentioned above, the region overlying the inferior lamina was localized under fluoroscopic visualization and the soft tissues overlying this structure were infiltrated with 4 ml. of 1% Lidocaine without Epinephrine. The Tuohy needle was inserted into the epidural space using a paramedian approach.   The epidural space was localized using loss of resistance along with counter oblique bi-planar fluoroscopic views.  After negative aspirate for air, blood, and CSF, a 2 ml. volume of Isovue-250 was injected into the epidural space and the flow of contrast was observed. Radiographs were obtained for documentation purposes.    The injectate was administered into the level noted above.   Additional Comments:  The patient tolerated the procedure well Dressing: 2 x 2 sterile gauze and Band-Aid    Post-procedure details: Patient was observed during the procedure. Post-procedure instructions were reviewed.  Patient left the clinic in stable condition.

## 2022-04-24 NOTE — Progress Notes (Signed)
Kentrel Clevenger - 56 y.o. male MRN [id number]  Date of birth: [birth year]  Office Visit Note: Visit Date: 04/24/2022 PCP: [name], NP Referred by: Nadara Mustard, MD  Subjective: Chief Complaint  Patient presents with   Lower Back - Pain   Right Leg - Pain   Left Leg - Pain   HPI:  Jordyn Failla is a 56 y.o. male who comes in today at the request of Dr. Aldean Baker for planned Right L3-4 Lumbar Interlaminar epidural steroid injection with fluoroscopic guidance.  The patient has failed conservative care including home exercise, medications, time and activity modification.  This injection will be diagnostic and hopefully therapeutic.  Please see requesting physician notes for further details and justification.   ROS Otherwise per HPI.  Assessment & Plan: Visit Diagnoses:    ICD-10-CM   1. Lumbar radiculopathy  M54.16 XR C-ARM NO REPORT    Epidural Steroid injection    methylPREDNISolone acetate (DEPO-MEDROL) injection 40 mg    2. Spinal stenosis of lumbar region with neurogenic claudication  M48.062 XR C-ARM NO REPORT    Epidural Steroid injection    methylPREDNISolone acetate (DEPO-MEDROL) injection 40 mg      Plan: No additional findings.   Meds & Orders:  Meds ordered this encounter  Medications   methylPREDNISolone acetate (DEPO-MEDROL) injection 40 mg    Orders Placed This Encounter  Procedures   XR C-ARM NO REPORT   Epidural Steroid injection    Follow-up: Return for visit to requesting provider as needed.   Procedures: No procedures performed  Lumbar Epidural Steroid Injection - Interlaminar Approach with Fluoroscopic Guidance  Patient: Armour Villanueva      Date of Birth: 03-Jul-1966 MRN: [id number] PCP: [name], NP      Visit Date: 04/24/2022   Universal Protocol:     Consent Given By: the patient  Position: PRONE  Additional Comments: Vital signs were monitored before and after the procedure. Patient was prepped and draped in the usual sterile  fashion. The correct patient, procedure, and site was verified.   Injection Procedure Details:   Procedure diagnoses: Lumbar radiculopathy [M54.16]   Meds Administered:  Meds ordered this encounter  Medications   methylPREDNISolone acetate (DEPO-MEDROL) injection 40 mg     Laterality: Right  Location/Site:  L3-4  Needle: 3.5 in., 20 ga. Tuohy  Needle Placement: Paramedian epidural  Findings:   -Comments: Excellent flow of contrast into the epidural space.  Procedure Details: Using a paramedian approach from the side mentioned above, the region overlying the inferior lamina was localized under fluoroscopic visualization and the soft tissues overlying this structure were infiltrated with 4 ml. of 1% Lidocaine without Epinephrine. The Tuohy needle was inserted into the epidural space using a paramedian approach.   The epidural space was localized using loss of resistance along with counter oblique bi-planar fluoroscopic views.  After negative aspirate for air, blood, and CSF, a 2 ml. volume of Isovue-250 was injected into the epidural space and the flow of contrast was observed. Radiographs were obtained for documentation purposes.    The injectate was administered into the level noted above.   Additional Comments:  The patient tolerated the procedure well Dressing: 2 x 2 sterile gauze and Band-Aid    Post-procedure details: Patient was observed during the procedure. Post-procedure instructions were reviewed.  Patient left the clinic in stable condition.   Clinical History: EXAM: MRI LUMBAR SPINE WITHOUT CONTRAST   TECHNIQUE: Multiplanar, multisequence MR imaging of the lumbar  spine was performed. No intravenous contrast was administered.   COMPARISON:  Radiographs December 23, 2021.   FINDINGS: Segmentation: A transitional lumbosacral vertebra is assumed to represent a partially sacralized L5 level. Careful correlation with this numbering strategy prior to any  procedural intervention would be recommended. d.   Alignment:  Grade 1 anterolisthesis of L4 over L5.   Vertebrae: No fracture, evidence of discitis, or bone lesion. Endplate degenerative changes at L4-5. Congenitally small spinal canal.   Conus medullaris and cauda equina: Conus extends to the L1-2 level. Conus and cauda equina appear normal.   Paraspinal and other soft tissues: Negative.   Disc levels:   T12-L1: No significant spinal canal or neural foraminal stenosis.   L1-2: Disc bulge and mild facet degenerative changes with associated epidural lipomatosis resulting in mild spinal canal stenosis and mild left neural foraminal narrowing.   L2-3: Disc bulge, mild to moderate facet degenerative changes and epidural lipomatosis resulting in moderate spinal canal stenosis and mild left neural foraminal narrowing.   L3-4: Disc bulge, mild-to-moderate facet degenerative changes and epidural lipomatosis resulting in moderate spinal canal stenosis, mild right and moderate left neural neural.   L4-5: Anterolisthesis, disc bulge with superimposed right foraminal disc protrusion and advanced facet degenerative changes with ligamentum flavum redundancy resulting in severe spinal canal stenosis, severe right and moderate left neural foraminal narrowing.   L5-S1: No spinal canal or neural foraminal stenosis.   IMPRESSION: 1. 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 2. 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 3. Severe right and moderate left neural foraminal narrowing at L4-5.     Electronically Signed   By: Baldemar Lenis M.D.   On: 02/26/2022 14:12     Objective:  VS:  HT:    WT:   BMI:     BP:   HR: bpm  TEMP: ( )  RESP:  Physical Exam Vitals and nursing note  reviewed.  Constitutional:      General: He is not in acute distress.    Appearance: Normal appearance. He is not ill-appearing.  HENT:     Head: Normocephalic and atraumatic.     Right Ear: External ear normal.     Left Ear: External ear normal.     Nose: No congestion.  Eyes:     Extraocular Movements: Extraocular movements intact.  Cardiovascular:     Rate and Rhythm: Normal rate.     Pulses: Normal pulses.  Pulmonary:     Effort: Pulmonary effort is normal. No respiratory distress.  Abdominal:     General: There is no distension.     Palpations: Abdomen is soft.  Musculoskeletal:        General: No tenderness or signs of injury.     Cervical back: Neck supple.     Right lower leg: No edema.     Left lower leg: No edema.     Comments: Patient has good distal strength without clonus.  Skin:    Findings: No erythema or rash.  Neurological:     General: No focal deficit present.     Mental Status: He is alert and oriented to person, place, and time.     Sensory: No sensory deficit.     Motor: No weakness or abnormal muscle tone.     Coordination: Coordination normal.  Psychiatric:  Mood and Affect: Mood normal.        Behavior: Behavior normal.      Imaging: No results found.

## 2022-04-28 ENCOUNTER — Other Ambulatory Visit: Payer: Self-pay

## 2022-04-28 DIAGNOSIS — E119 Type 2 diabetes mellitus without complications: Secondary | ICD-10-CM

## 2022-04-28 LAB — GLUCOSE, POCT (MANUAL RESULT ENTRY): POC Glucose: 193 mg/dl — AB (ref 70–99)

## 2022-05-07 ENCOUNTER — Other Ambulatory Visit: Payer: Self-pay

## 2022-05-07 ENCOUNTER — Emergency Department (HOSPITAL_COMMUNITY): Payer: Self-pay

## 2022-05-07 ENCOUNTER — Emergency Department (HOSPITAL_COMMUNITY)
Admission: EM | Admit: 2022-05-07 | Discharge: 2022-05-07 | Disposition: A | Discharge status: Home / Self Care | Payer: Self-pay | Attending: Emergency Medicine | Admitting: Emergency Medicine

## 2022-05-07 ENCOUNTER — Encounter (HOSPITAL_COMMUNITY): Payer: Self-pay

## 2022-05-07 DIAGNOSIS — Z79899 Other long term (current) drug therapy: Secondary | ICD-10-CM | POA: Insufficient documentation

## 2022-05-07 DIAGNOSIS — S22080A Wedge compression fracture of T11-T12 vertebra, initial encounter for closed fracture: Secondary | ICD-10-CM

## 2022-05-07 DIAGNOSIS — Z7984 Long term (current) use of oral hypoglycemic drugs: Secondary | ICD-10-CM | POA: Insufficient documentation

## 2022-05-07 DIAGNOSIS — I10 Essential (primary) hypertension: Secondary | ICD-10-CM | POA: Insufficient documentation

## 2022-05-07 DIAGNOSIS — Z20822 Contact with and (suspected) exposure to covid-19: Secondary | ICD-10-CM | POA: Insufficient documentation

## 2022-05-07 DIAGNOSIS — S3991XA Unspecified injury of abdomen, initial encounter: Secondary | ICD-10-CM | POA: Insufficient documentation

## 2022-05-07 DIAGNOSIS — W19XXXA Unspecified fall, initial encounter: Secondary | ICD-10-CM | POA: Insufficient documentation

## 2022-05-07 DIAGNOSIS — S22089A Unspecified fracture of T11-T12 vertebra, initial encounter for closed fracture: Secondary | ICD-10-CM | POA: Insufficient documentation

## 2022-05-07 DIAGNOSIS — S2232XA Fracture of one rib, left side, initial encounter for closed fracture: Secondary | ICD-10-CM | POA: Insufficient documentation

## 2022-05-07 DIAGNOSIS — F0390 Unspecified dementia without behavioral disturbance: Secondary | ICD-10-CM | POA: Insufficient documentation

## 2022-05-07 DIAGNOSIS — S0990XA Unspecified injury of head, initial encounter: Secondary | ICD-10-CM | POA: Insufficient documentation

## 2022-05-07 DIAGNOSIS — E119 Type 2 diabetes mellitus without complications: Secondary | ICD-10-CM | POA: Insufficient documentation

## 2022-05-07 LAB — RESP PANEL BY RT-PCR (FLU A&B, COVID) ARPGX2
Influenza A by PCR: NEGATIVE
Influenza B by PCR: NEGATIVE
SARS Coronavirus 2 by RT PCR: NEGATIVE

## 2022-05-07 LAB — CBC WITH DIFFERENTIAL/PLATELET
Abs Immature Granulocytes: 0.11 10*3/uL — ABNORMAL HIGH (ref 0.00–0.07)
Basophils Absolute: 0 10*3/uL (ref 0.0–0.1)
Basophils Relative: 0 %
Eosinophils Absolute: 0.1 10*3/uL (ref 0.0–0.5)
Eosinophils Relative: 1 %
HCT: 32 % — ABNORMAL LOW (ref 39.0–52.0)
Hemoglobin: 10.5 g/dL — ABNORMAL LOW (ref 13.0–17.0)
Immature Granulocytes: 1 %
Lymphocytes Relative: 11 %
Lymphs Abs: 1.1 10*3/uL (ref 0.7–4.0)
MCH: 24.5 pg — ABNORMAL LOW (ref 26.0–34.0)
MCHC: 32.8 g/dL (ref 30.0–36.0)
MCV: 74.8 fL — ABNORMAL LOW (ref 80.0–100.0)
Monocytes Absolute: 1.2 10*3/uL — ABNORMAL HIGH (ref 0.1–1.0)
Monocytes Relative: 11 %
Neutro Abs: 7.7 10*3/uL (ref 1.7–7.7)
Neutrophils Relative %: 76 %
Platelets: 266 10*3/uL (ref 150–400)
RBC: 4.28 MIL/uL (ref 4.22–5.81)
RDW: 15.2 % (ref 11.5–15.5)
WBC: 10.2 10*3/uL (ref 4.0–10.5)
nRBC: 0.2 % (ref 0.0–0.2)

## 2022-05-07 LAB — COMPREHENSIVE METABOLIC PANEL
ALT: 32 U/L (ref 0–44)
AST: 32 U/L (ref 15–41)
Albumin: 2.5 g/dL — ABNORMAL LOW (ref 3.5–5.0)
Alkaline Phosphatase: 105 U/L (ref 38–126)
Anion gap: 13 (ref 5–15)
BUN: 24 mg/dL — ABNORMAL HIGH (ref 6–20)
CO2: 23 mmol/L (ref 22–32)
Calcium: 8.6 mg/dL — ABNORMAL LOW (ref 8.9–10.3)
Chloride: 99 mmol/L (ref 98–111)
Creatinine, Ser: 1.12 mg/dL (ref 0.61–1.24)
GFR, Estimated: >60 mL/min (ref >60)
Glucose, Bld: 86 mg/dL (ref 70–99)
Potassium: 2.8 mmol/L — ABNORMAL LOW (ref 3.5–5.1)
Sodium: 135 mmol/L (ref 135–145)
Total Bilirubin: 0.8 mg/dL (ref 0.3–1.2)
Total Protein: 5.9 g/dL — ABNORMAL LOW (ref 6.5–8.1)

## 2022-05-07 LAB — ETHANOL: Alcohol, Ethyl (B): <10 mg/dL (ref <10)

## 2022-05-07 MED ORDER — ONDANSETRON HCL 4 MG/2ML IJ SOLN
4.0000 mg | Freq: Once | INTRAMUSCULAR | Status: AC
Start: 2022-05-07 — End: 2022-05-07
  Administered 2022-05-07: 4 mg via INTRAVENOUS
  Filled 2022-05-07: qty 2

## 2022-05-07 MED ORDER — POTASSIUM CHLORIDE CRYS ER 20 MEQ PO TBCR
40.0000 meq | EXTENDED_RELEASE_TABLET | Freq: Once | ORAL | Status: AC
  Administered 2022-05-07: 40 meq via ORAL
  Filled 2022-05-07: qty 2

## 2022-05-07 MED ORDER — FENTANYL CITRATE PF 50 MCG/ML IJ SOSY
50.0000 ug | PREFILLED_SYRINGE | Freq: Once | INTRAMUSCULAR | Status: AC
  Administered 2022-05-07: 50 ug via INTRAVENOUS
  Filled 2022-05-07: qty 1

## 2022-05-07 MED ORDER — FENTANYL CITRATE PF 50 MCG/ML IJ SOSY: 50.0000 ug | PREFILLED_SYRINGE | Freq: Once | INTRAMUSCULAR | Status: DC

## 2022-05-07 MED ORDER — OXYCODONE-ACETAMINOPHEN 5-325 MG PO TABS: 2.0000 | ORAL_TABLET | Freq: Once | ORAL | Status: DC

## 2022-05-07 MED ORDER — OXYCODONE-ACETAMINOPHEN 5-325 MG PO TABS
1.0000 | ORAL_TABLET | Freq: Four times a day (QID) | ORAL | 0 refills | Status: DC | PRN
  Filled 2022-05-07: qty 8, 2d supply, fill #0

## 2022-05-07 MED ORDER — LIDOCAINE 5 % EX PTCH
1.0000 | MEDICATED_PATCH | Freq: Once | CUTANEOUS | Status: DC
  Administered 2022-05-07: 1 via TRANSDERMAL
  Filled 2022-05-07: qty 1

## 2022-05-07 MED ORDER — IOHEXOL 300 MG/ML  SOLN
85.0000 mL | Freq: Once | INTRAMUSCULAR | Status: AC | PRN
  Administered 2022-05-07: 85 mL via INTRAVENOUS

## 2022-05-07 MED ORDER — OXYCODONE-ACETAMINOPHEN 5-325 MG PO TABS
2.0000 | ORAL_TABLET | Freq: Once | ORAL | Status: DC
  Administered 2022-05-07: 2 via ORAL
  Filled 2022-05-07: qty 2

## 2022-05-07 MED ORDER — OXYCODONE-ACETAMINOPHEN 5-325 MG PO TABS: 1.0000 | ORAL_TABLET | Freq: Once | ORAL | Status: DC

## 2022-05-07 NOTE — ED Provider Triage Note (Signed)
Emergency Medicine Provider Triage Evaluation Note  Brynn Enfield , a 56 y.o. male  was evaluated in triage.  Patient brought in by EMS due to rolling around on the concrete in front of his dwelling appearing in pain.  Pointing to his back and appears uncomfortable.  Marliss Czar, have attempted to locate an appropriate translator 4 times without success, no one is available via language [employer].  Pt touches stomach, chest, and back and repeats "pain".  Does not do this for head or neck.  Patient does not speak any other language, unable to obtain further history.  Review of Systems  Positive:  Negative: See above  Physical Exam  BP (!) 152/110   Pulse 95   Temp 98.3 F (36.8 C) (Oral)   Resp 18   SpO2 97%  Gen:   Awake, no distress   Resp:  Normal effort, CTAB, symmetric rise and fall MSK:   Moves extremities without difficulty  Other:  Abdomen soft, tender.  Gaze aligned appropriately.  No lower extremity swelling.  Chest tender  Medical Decision Making  Medically screening exam initiated at 2:08 PM.  Appropriate orders placed.  Helios Todisco was informed that the remainder of the evaluation will be completed by another provider, this initial triage assessment does not replace that evaluation, and the importance of remaining in the ED until their evaluation is complete.     Cecil Cobbs, PA-C 05/07/22 1418

## 2022-05-07 NOTE — Discharge Instructions (Signed)
CT scans performed today shows that you have a compression fracture in your T12 vertebrae as well as a broken rib.  This is likely contributing to the pain that you have had in your back.  Please call your orthopedic doctor, Dr. Lajoyce Corners, for follow-up.  Use the pain medicine prescribed for severe pain.  Also use ice or heat on the areas that are sore and over-the-counter medications as instructed on the packaging.  Do not combine Tylenol with prescribed pain medication.

## 2022-05-07 NOTE — ED Provider Notes (Signed)
[facility] EMERGENCY DEPARTMENT Provider Note   CSN: [id number] Arrival date & time: 05/07/22  1331     History  Chief Complaint  Patient presents with   Fall    Wayne Stokes is a 56 y.o. male.  Patient with history of back pain, DM, HTN, dementia -- presents to ED for back pain. Language barrier has been an issue as he speaks Montagnard and there is no interpreter readily available at time of initial exam.   Per EMS report, patient was found outside on concrete rolling around. Unknown fall of trauma.   MRI noted in Epic, from 02/26/22, impression:  1. 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 2. 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 3. Severe right and moderate left neural foraminal narrowing at L4-5.  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  I have been working with Buford for several months.  He has been experiencing back pain which has worsened over the past few months.  He was seen by Dr. Alvester Morin for an injection on 8/17 but apparently no relief. He frequently lays on the cement outside his apartment saying it sometimes helps his pain."      Home Medications Prior to Admission medications   Medication Sig Start Date End Date Taking? Authorizing Provider  amLODipine (NORVASC) 5 MG tablet Take 1 tablet (5 mg total) by mouth daily. 03/13/22 06/11/22  [name], [name], NP  atorvastatin (LIPITOR) 40 MG tablet Take 1 tablet (40 mg total) by mouth daily. 11/18/21   [name], NP  clotrimazole-betamethasone (LOTRISONE) cream Apply 1 Application topically daily. 03/17/22   Ivonne Andrew, NP  donepezil (ARICEPT) 5 MG tablet Take 1 tablet (5 mg total) by mouth at bedtime. 12/12/21   Windell Norfolk, MD  gabapentin (NEURONTIN) 300 MG  capsule Take 1 capsule (300 mg total) by mouth 3 (three) times daily. 12/16/21 04/14/22  [name], [name], NP  glipiZIDE (GLUCOTROL) 5 MG tablet Take 1 tablet (5 mg total) by mouth 2 (two) times daily before a meal. 03/17/22   Ivonne Andrew, NP  losartan-hydrochlorothiazide (HYZAAR) 50-12.5 MG tablet Take 1 tablet by mouth daily. 12/16/21 05/16/22  [name], NP  metFORMIN (GLUCOPHAGE) 500 MG tablet Take 1 tablet (500 mg total) by mouth 2 (two) times daily with a meal. 11/15/21   [name], [name], NP  metoprolol succinate (TOPROL-XL) 25 MG 24 hr tablet Take 1 tablet (25 mg total) by mouth daily. 03/13/22 06/11/22  [name], [name], NP  Omega 3 1000 MG CAPS Take 1,000 mg by mouth daily.    [provider]  predniSONE (DELTASONE) 10 MG tablet Take 1 tablet (10 mg total) by mouth daily with breakfast. 04/08/22   Nadara Mustard, MD      Allergies    Beef-derived products, Pork-derived products, Shellfish allergy, Eggs or egg-derived products, and Other    Review of Systems   Review of Systems  Physical Exam Updated Vital Signs BP (!) 152/110   Pulse 95   Temp 98.3 F (36.8 C) (Oral)   Resp 18   SpO2 97%   Physical Exam Vitals and nursing note reviewed.  Constitutional:      General: He is in acute distress.     Appearance: He is well-developed.     Comments: Patient is rolling  from side to side in the exam bed, pointing to his back, stating pain.  He appears uncomfortable.  HENT:     Head: Normocephalic and atraumatic.     Right Ear: External ear normal.     Left Ear: External ear normal.     Nose: Nose normal.     Mouth/Throat:     Mouth: Mucous membranes are moist.  Eyes:     General:        Right eye: No discharge.        Left eye: No discharge.     Conjunctiva/sclera: Conjunctivae normal.  Cardiovascular:     Rate and Rhythm: Normal rate and regular rhythm.     Heart sounds: Normal heart sounds.  Pulmonary:     Effort: Pulmonary effort is normal.      Breath sounds: Normal breath sounds.  Abdominal:     Palpations: Abdomen is soft.     Tenderness: There is abdominal tenderness. There is no guarding or rebound.     Comments: Some hyperpigmentation, nearly circumferential around the umbilicus.  Unsure if old bruising.   Musculoskeletal:     Cervical back: Normal range of motion and neck supple.     Comments: Patient has tenderness to palpation along the entirety of the cervical, thoracic, and lumbar spine.  Skin:    General: Skin is warm and dry.  Neurological:     Mental Status: He is alert.    ED Results / Procedures / Treatments   Labs (all labs ordered are listed, but only abnormal results are displayed) Labs Reviewed  COMPREHENSIVE METABOLIC PANEL - Abnormal; Notable for the following components:      Result Value   Potassium 2.8 (*)    BUN 24 (*)    Calcium 8.6 (*)    Total Protein 5.9 (*)    Albumin 2.5 (*)    All other components within normal limits  CBC WITH DIFFERENTIAL/PLATELET - Abnormal; Notable for the following components:   Hemoglobin 10.5 (*)    HCT 32.0 (*)    MCV 74.8 (*)    MCH 24.5 (*)    Monocytes Absolute 1.2 (*)    Abs Immature Granulocytes 0.11 (*)    All other components within normal limits  RESP PANEL BY RT-PCR (FLU A&B, COVID) ARPGX2  ETHANOL  CBG MONITORING, ED    EKG None  Radiology CT CHEST ABDOMEN PELVIS W CONTRAST  Result Date: 05/07/2022 CLINICAL DATA:  Trauma.  Back and abdominal pain. EXAM: CT CHEST, ABDOMEN, AND PELVIS WITH CONTRAST TECHNIQUE: Multidetector CT imaging of the chest, abdomen and pelvis was performed following the standard protocol during bolus administration of intravenous contrast. RADIATION DOSE REDUCTION: This exam was performed according to the departmental dose-optimization program which includes automated exposure control, adjustment of the mA and/or kV according to patient size and/or use of iterative reconstruction technique. CONTRAST:  71mL OMNIPAQUE IOHEXOL  300 MG/ML  SOLN COMPARISON:  CT of the thoracic and lumbar spine dated 05/07/2022. Lumbar spine MRI dated 02/25/2022. FINDINGS: CT CHEST FINDINGS Cardiovascular: There is no cardiomegaly or pericardial effusion. Mild atherosclerotic calcification of the thoracic aorta. No aneurysmal dilatation or dissection. The origins of the great vessels of the aortic arch and the central pulmonary arteries are patent. Mediastinum/Nodes: No hilar or mediastinal adenopathy. The esophagus and the thyroid gland are grossly unremarkable. No mediastinal fluid collection. Lungs/Pleura: The lungs are clear. There is no pleural effusion or pneumothorax. The central airways are patent. Musculoskeletal: Mildly displaced fracture of  the lateral left tenth rib. There is acute compression fracture of the T12 vertebra with approximately 25% loss of vertebral body height. Severe degenerative changes of the right shoulder. CT ABDOMEN PELVIS FINDINGS No intra-abdominal free air or free fluid. Hepatobiliary: No focal liver abnormality is seen. No gallstones, gallbladder wall thickening, or biliary dilatation. Pancreas: Unremarkable. No pancreatic ductal dilatation or surrounding inflammatory changes. Spleen: Normal in size without focal abnormality. Adrenals/Urinary Tract: The adrenal glands unremarkable. There is no hydronephrosis on either side. There is symmetric enhancement and excretion of contrast by both kidneys. The visualized ureters and urinary bladder appear unremarkable. Stomach/Bowel: There is focal area of outpouching of the medial wall of the duodenal bulb (62/3). There is mild associated wall thickening and inflammatory changes. Findings may represent an inflamed duodenal ulcer versus less likely an acute diverticulitis. No perforation or abscess. There is no bowel obstruction. The appendix is normal. Vascular/Lymphatic: Mild aortoiliac atherosclerotic disease. The IVC is unremarkable. No portal venous gas. There is no adenopathy.  Reproductive: The prostate and seminal vesicles are grossly unremarkable. No pelvic mass. Other: None Musculoskeletal: Severe degenerative changes at L4-L5 with disc desiccation and vacuum phenomena. There is posterior disc bulge and facet arthropathy at this level. No acute osseous pathology. IMPRESSION: 1. Mildly displaced fracture of the lateral left tenth rib. 2. Acute compression fracture of the T12 vertebra with approximately 25% loss of vertebral body height. 3. Inflamed duodenal ulcer versus less likely an acute diverticulitis. No perforation or abscess. 4. Aortic Atherosclerosis (ICD10-I70.0). Electronically Signed   By: Elgie Collard M.D.   On: 05/07/2022 18:09   CT L-SPINE NO CHARGE  Result Date: 05/07/2022 CLINICAL DATA:  Trauma.  Back pain abdominal pain. EXAM: CT THORACIC AND LUMBAR SPINE WITHOUT CONTRAST TECHNIQUE: Multidetector CT imaging of the thoracic and lumbar spine was performed without contrast. Multiplanar CT image reconstructions were also generated. RADIATION DOSE REDUCTION: This exam was performed according to the departmental dose-optimization program which includes automated exposure control, adjustment of the mA and/or kV according to patient size and/or use of iterative reconstruction technique. COMPARISON:  CT of the chest abdomen pelvis dated 05/07/2022. FINDINGS: CT THORACIC SPINE FINDINGS Alignment: No acute subluxation. Vertebrae: Acute compression fracture of T12 with approximately 25% loss of vertebral body height. There is extension of the fracture into the inferior endplate. No retropulsion. Paraspinal and other soft tissues: Negative. Disc levels: Mild degenerative changes. CT LUMBAR SPINE FINDINGS Segmentation: 5 lumbar type vertebrae. Alignment: No acute subluxation. There is straightening of normal lumbar lordosis which may be positional or due to muscle spasm. Vertebrae: No acute fracture. Paraspinal and other soft tissues: Negative. Disc levels: Severe  degenerative changes at L4-L5 with disc desiccation and vacuum phenomena. There is posterior disc bulge and moderate facet arthropathy at this level. IMPRESSION: Acute compression fracture of T12 with approximately 25% loss of vertebral body height. No retropulsion. Electronically Signed   By: Elgie Collard M.D.   On: 05/07/2022 18:00   CT T-SPINE NO CHARGE  Result Date: 05/07/2022 CLINICAL DATA:  Trauma.  Back pain abdominal pain. EXAM: CT THORACIC AND LUMBAR SPINE WITHOUT CONTRAST TECHNIQUE: Multidetector CT imaging of the thoracic and lumbar spine was performed without contrast. Multiplanar CT image reconstructions were also generated. RADIATION DOSE REDUCTION: This exam was performed according to the departmental dose-optimization program which includes automated exposure control, adjustment of the mA and/or kV according to patient size and/or use of iterative reconstruction technique. COMPARISON:  CT of the chest abdomen pelvis dated 05/07/2022. FINDINGS: CT  THORACIC SPINE FINDINGS Alignment: No acute subluxation. Vertebrae: Acute compression fracture of T12 with approximately 25% loss of vertebral body height. There is extension of the fracture into the inferior endplate. No retropulsion. Paraspinal and other soft tissues: Negative. Disc levels: Mild degenerative changes. CT LUMBAR SPINE FINDINGS Segmentation: 5 lumbar type vertebrae. Alignment: No acute subluxation. There is straightening of normal lumbar lordosis which may be positional or due to muscle spasm. Vertebrae: No acute fracture. Paraspinal and other soft tissues: Negative. Disc levels: Severe degenerative changes at L4-L5 with disc desiccation and vacuum phenomena. There is posterior disc bulge and moderate facet arthropathy at this level. IMPRESSION: Acute compression fracture of T12 with approximately 25% loss of vertebral body height. No retropulsion. Electronically Signed   By: Elgie Collard M.D.   On: 05/07/2022 18:00   CT HEAD WO  CONTRAST ( )  Result Date: 05/07/2022 CLINICAL DATA:  Possible fall, neck injury. EXAM: CT HEAD WITHOUT CONTRAST CT CERVICAL SPINE WITHOUT CONTRAST TECHNIQUE: Multidetector CT imaging of the head and cervical spine was performed following the standard protocol without intravenous contrast. Multiplanar CT image reconstructions of the cervical spine were also generated. RADIATION DOSE REDUCTION: This exam was performed according to the departmental dose-optimization program which includes automated exposure control, adjustment of the mA and/or kV according to patient size and/or use of iterative reconstruction technique. COMPARISON:  December 05, 2021. FINDINGS: CT HEAD FINDINGS Brain: No evidence of acute infarction, hemorrhage, hydrocephalus, extra-axial collection or mass lesion/mass effect. Vascular: No hyperdense vessel or unexpected calcification. Skull: Normal. Negative for fracture or focal lesion. Sinuses/Orbits: No acute finding. Other: None. CT CERVICAL SPINE FINDINGS Alignment: Normal. Skull base and vertebrae: No acute fracture. No primary bone lesion or focal pathologic process. Soft tissues and spinal canal: No prevertebral fluid or swelling. No visible canal hematoma. Disc levels: Moderate degenerative disc disease is noted at C4-5 and C5-6. Upper chest: Negative. Other: None. IMPRESSION: No acute intracranial abnormality seen. 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 No fracture or spondylolisthesis is noted. Electronically Signed   By: Lupita Raider M.D.   On: 05/07/2022 17:57   CT Cervical Spine Wo Contrast  Result Date: 05/07/2022 CLINICAL DATA:  Possible fall, neck injury. EXAM: CT HEAD WITHOUT CONTRAST CT CERVICAL SPINE WITHOUT CONTRAST TECHNIQUE: Multidetector CT imaging of the head and cervical spine was performed following the standard protocol without intravenous contrast. Multiplanar CT image reconstructions of the cervical spine were also  generated. RADIATION DOSE REDUCTION: This exam was performed according to the departmental dose-optimization program which includes automated exposure control, adjustment of the mA and/or kV according to patient size and/or use of iterative reconstruction technique. COMPARISON:  December 05, 2021. FINDINGS: CT HEAD FINDINGS Brain: No evidence of acute infarction, hemorrhage, hydrocephalus, extra-axial collection or mass lesion/mass effect. Vascular: No hyperdense vessel or unexpected calcification. Skull: Normal. Negative for fracture or focal lesion. Sinuses/Orbits: No acute finding. Other: None. CT CERVICAL SPINE FINDINGS Alignment: Normal. Skull base and vertebrae: No acute fracture. No primary bone lesion or focal pathologic process. Soft tissues and spinal canal: No prevertebral fluid or swelling. No visible canal hematoma. Disc levels: Moderate degenerative disc disease is noted at C4-5 and C5-6. Upper chest: Negative. Other: None. IMPRESSION: No acute intracranial abnormality seen. 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 No fracture or spondylolisthesis is noted. Electronically Signed   By: Lupita Raider M.D.   On: 05/07/2022 17:57    Procedures Procedures  Medications Ordered in ED Medications  lidocaine (LIDODERM) 5 % 1 patch (1 patch Transdermal Patch Applied 05/07/22 2111)  oxyCODONE-acetaminophen (PERCOCET/ROXICET) 5-325 MG per tablet 2 tablet (0 tablets Oral Hold 05/07/22 2118)  fentaNYL (SUBLIMAZE) injection 50 mcg (50 mcg Intravenous Given 05/07/22 1540)  ondansetron (ZOFRAN) injection 4 mg (4 mg Intravenous Given 05/07/22 1540)  iohexol (OMNIPAQUE) 300 MG/ML solution 85 mL (85 mLs Intravenous Contrast Given 05/07/22 1749)  potassium chloride SA (KLOR-CON M) CR tablet 40 mEq (40 mEq Oral Given 05/07/22 2107)    ED Course/ Medical Decision Making/ A&P    Patient seen and examined. History obtained from various sources. Unclear events leading up to EMS  being called. This is likely chronic back pain exacerbation, but cannot confirm that patient did not have any preceding trauma today.  In addition to pain in the lower back, also seems to be complaining about pain in the abdomen, mid back up to the base of the neck. Given paucity of information, will proceed with imaging to ensure no confounding injury.   Labs/EKG: Personally reviewed and interpreted CBC demonstrating mild anemia at 10.5, microcytic, normal white blood cell count; CMP with hypokalemia of 2.8, normal kidney function.  Imaging: Ordered CT head, cervical spine, chest abdomen pelvis with T-spine and L-spine.  Medications/Fluids: Ordered: Fentanyl 50 mcg IV, Zofran 4 mg IV.   Most recent vital signs reviewed and are as follows: BP (!) 152/110   Pulse 95   Temp 98.3 F (36.8 C) (Oral)   Resp 18   SpO2 97%   Initial impression: chronic back pain, unclear recent trauma.   3:58 PM Reassessment performed. Patient appears more comfortable.   Plan: awaiting imaging.    Labs personally reviewed and interpreted including: COVID-negative, alcohol negative.  Imaging personally visualized and interpreted including: CT head and cervical spine, agree negative for acute findings.  CT of the chest, abdomen, pelvis shows mildly displaced lateral left 10th rib fracture, otherwise negative for acute findings other than possible duodenal ulcer.  Do not feel this is likely contributing to patient's main complaint of back pain.  CT thoracic and lumbar spine shows evidence of acute T12 compression fracture, 25% vertebral height loss .   Plan: Will need interpreter discussed with patient   7:48 PM Tried to call for interpreter x 2. Unable to contact, number goes to VM. I left message. No callback over the past 45 minutes.   9:27 PM I was able to get a hold of a different [place] interpreter to assist with informing patient of results today.  Reassessment performed and he appears stable.   Continues to complain of pain in his back.  Most current vital signs reviewed and are as follows: BP (!) 139/101   Pulse (!) 104   Temp (!) 97.5 F (36.4 C) (Oral)   Resp 18   SpO2 96%   Plan: Discharge to home. He will need PTAR as he does not have a ride home.  Ordered 2 Percocet to take by mouth as well as potassium supplement prior to discharge.  Prescriptions written for: Oxycodone for home  Other home care instructions discussed: Rest, pain control, ice and heat.  ED return instructions discussed: Uncontrolled pain, worsening or changing symptoms.  Follow-up instructions discussed: Patient encouraged to call his orthopedist tomorrow to schedule follow-up appointment.                            Medical Decision Making Amount  and/or Complexity of Data Reviewed Labs: ordered. Radiology: ordered.  Risk Prescription drug management.   Patient here for back and abdominal pain today.  The back pain is chronic in nature, not resolved with recent spinal injection.  There is uncertainty as to any traumatic injury given language barrier.  Patient was found outside on the ground today.  Imaging does show T12 compression fracture and a rib fracture.  Patient provided with pain control.  No acute injury to the head or C-spine.  Question of inflamed duodenal ulcer, however labs are reassuring and no signs of peritonitis on abdominal exam.  Do not feel that this is likely contributory to worst symptoms today.  Patient will need outpatient follow-up.  No indication for admission at this time.  The patient's vital signs, pertinent lab work and imaging were reviewed and interpreted as discussed in the ED course. Hospitalization was considered for further testing, treatments, or serial exams/observation. However as patient is well-appearing, has a stable exam, and reassuring studies today, I do not feel that they warrant admission at this time. This plan was discussed with the patient who  verbalizes agreement and comfort with this plan and seems reliable and able to return to the Emergency Department with worsening or changing symptoms.          Final Clinical Impression(s) / ED Diagnoses Final diagnoses:  Compression fracture of T12 vertebra, initial encounter (HCC)  Closed fracture of one rib of left side, initial encounter    Rx / DC Orders ED Discharge Orders          Ordered    oxyCODONE-acetaminophen (PERCOCET/ROXICET) 5-325 MG tablet  Every 6 hours PRN        05/07/22 2123              Renne Crigler, PA-C 05/07/22 2209    Alvira Monday, MD 05/10/22 [phone]

## 2022-05-07 NOTE — ED Notes (Signed)
Contacted [name] for language interpretation. Pt reported that he placed himself on the ground to roll around because it helps with the pain but pain was too bad that he couldn't get up so he called someone to help him. Pt did not fall out of wheelchair. Pt is A&Ox4.

## 2022-05-07 NOTE — ED Triage Notes (Signed)
Pt BIB [facility] from his home where someone called EMS. EMS found pt rolling around on a cement patio and was pointing to his back. Unsure what the real chief complaint is d/t language barrier.

## 2022-05-08 ENCOUNTER — Other Ambulatory Visit: Payer: Self-pay

## 2022-05-09 ENCOUNTER — Telehealth: Payer: Self-pay

## 2022-05-09 NOTE — Telephone Encounter (Signed)
Best if yates would see, could be [place] as well

## 2022-05-09 NOTE — Telephone Encounter (Signed)
Phone call to interpreter [name]. Informed her that patient has an appointment on Tuesday 05/13/2022 @ 10:15 am with [name] at [facility].  She will call and relay this to him and let him know that transportation has been requested through [name] and that CN plans to meet him at the appointment. Nghieng said she spoke with him earlier today and he stated that the pain medication prescribed at the ED is helping. Brantley Fling RN, Congregational Nurse [phone]

## 2022-05-09 NOTE — Telephone Encounter (Signed)
Spoke with Brantley Fling, Case Manager who helps with patient. Appointment scheduled with [name] on Tuesday, 05/13/2022 at 10:15. She will plan to accompany patient to this appointment.

## 2022-05-09 NOTE — Telephone Encounter (Signed)
Pt was in the ER 05/07/22 and has a T12 compression fx and left sided rib fractures. Called to sch follow up in office. What would you recommend this pt do and who should he see?

## 2022-05-09 NOTE — Telephone Encounter (Signed)
Sure we can.  He had a cancellation for  Tuesday morning if you want to put him there and that works for him.  He has also been opened on Friday morning if that works better.  Just put ok per me in the appointment note.

## 2022-05-09 NOTE — Telephone Encounter (Signed)
Could you please take a look at this and see if [name] would see this pt and if so when he could be scheduled?

## 2022-05-13 ENCOUNTER — Ambulatory Visit (INDEPENDENT_AMBULATORY_CARE_PROVIDER_SITE_OTHER): Payer: Self-pay | Admitting: Orthopaedic Surgery

## 2022-05-13 ENCOUNTER — Encounter: Payer: Self-pay | Admitting: Orthopaedic Surgery

## 2022-05-13 DIAGNOSIS — M48062 Spinal stenosis, lumbar region with neurogenic claudication: Secondary | ICD-10-CM

## 2022-05-13 DIAGNOSIS — S22080A Wedge compression fracture of T11-T12 vertebra, initial encounter for closed fracture: Secondary | ICD-10-CM

## 2022-05-13 DIAGNOSIS — S22000A Wedge compression fracture of unspecified thoracic vertebra, initial encounter for closed fracture: Secondary | ICD-10-CM | POA: Insufficient documentation

## 2022-05-13 DIAGNOSIS — M48061 Spinal stenosis, lumbar region without neurogenic claudication: Secondary | ICD-10-CM | POA: Insufficient documentation

## 2022-05-13 MED ORDER — OXYCODONE-ACETAMINOPHEN 5-325 MG PO TABS: 1.0000 | ORAL_TABLET | ORAL | 0 refills | Status: DC | PRN

## 2022-05-13 NOTE — Progress Notes (Addendum)
Office Visit Note   Patient: Wayne Stokes           Date of Birth: December 26, 1965           MRN: [id number] Visit Date: 05/13/2022              Requested by: Kathrynn Speed, NP [address] [place],  [place] [address] PCP: Kathrynn Speed, NP   Assessment & Plan: Visit Diagnoses:  1. Compression fracture of T12 vertebra, initial encounter (HCC)   2. Spinal stenosis of lumbar region with neurogenic claudication     Plan: Return 1 month.  Repeat x-ray thoracolumbar junction to assess T12.  We will also obtain lateral lumbar flexion-extension radiographs.  We discussed having him see [name] once his thoracic fracture is healed for evaluation of possible instrumented L4-5 fusion  With decompression.  Percocet 12 tablets prescribed he will stretch them out as long as he can we discussed that using chronic narcotics for this is not a good idea.  Follow-Up Instructions: Return in about 1 month (around 06/12/2022).   Orders:  No orders of the defined types were placed in this encounter.  Meds ordered this encounter  Medications   oxyCODONE-acetaminophen (PERCOCET/ROXICET) 5-325 MG tablet    Sig: Take 1 tablet by mouth every 4 (four) hours as needed for severe pain.    Dispense:  12 tablet    Refill:  0      Procedures: No procedures performed   Clinical Data: No additional findings.   Subjective: Chief Complaint  Patient presents with   Lower Back - Pain    HPI 56 year old male here with an interpreter.  He has diabetes and A1c is 8.  And also with the fall seen in emergency department diagnosed with a T12 compression fracture without canal compromise.  He is also had history of low back pain was using a walker prior to his fall.  He has had some chronic low back pain has spinal stenosis at the L4-5 level with grade 1 anterolisthesis.  Disc protrusion multifactorial stenosis is noted.  He has been putting a hot water bottle on his back drops and had a second-degree burn  medial aspect of his ankle which is now healed with blister resolution.  Patient is also noted to have low albumin level 2.5.   Patient's been on Glucotrol, Norvasc for hypertension.  He had some oxycodone 8 tablets which she is stretched out.  Patient is on metformin.  Review of Systems positive type 2 diabetes not on insulin, positive peripheral neuropathy.   Objective: Vital Signs: BP (!) 144/106   Pulse (!) 120   Ht 5' (1.524 m)   Wt 138 lb (62.6 kg)   BMI 26.95 kg/m   Physical Exam Constitutional:      Appearance: He is well-developed.     Comments: Chronic ill appearance.  HENT:     Head: Normocephalic and atraumatic.     Right Ear: External ear normal.     Left Ear: External ear normal.  Eyes:     Pupils: Pupils are equal, round, and reactive to light.  Neck:     Thyroid: No thyromegaly.     Trachea: No tracheal deviation.  Cardiovascular:     Rate and Rhythm: Normal rate.  Pulmonary:     Effort: Pulmonary effort is normal.     Breath sounds: No wheezing.  Abdominal:     General: Bowel sounds are normal.     Palpations: Abdomen  is soft.  Musculoskeletal:     Cervical back: Neck supple.  Skin:    General: Skin is warm and dry.     Capillary Refill: Capillary refill takes less than 2 seconds.  Neurological:     Mental Status: He is alert and oriented to person, place, and time.  Psychiatric:        Behavior: Behavior normal.        Thought Content: Thought content normal.        Judgment: Judgment normal.     Ortho Exam healed medial side of his right ankle and foot with resolution of blisters from grade 2 burn.  Patient ambulates with a walker.  Specialty Comments:  EXAM: MRI LUMBAR SPINE WITHOUT CONTRAST   TECHNIQUE: Multiplanar, multisequence MR imaging of the lumbar spine was performed. No intravenous contrast was administered.   COMPARISON:  Radiographs December 23, 2021.   FINDINGS: Segmentation: A transitional lumbosacral vertebra is assumed  to represent a partially sacralized L5 level. Careful correlation with this numbering strategy prior to any procedural intervention would be recommended. d.   Alignment:  Grade 1 anterolisthesis of L4 over L5.   Vertebrae: No fracture, evidence of discitis, or bone lesion. Endplate degenerative changes at L4-5. Congenitally small spinal canal.   Conus medullaris and cauda equina: Conus extends to the L1-2 level. Conus and cauda equina appear normal.   Paraspinal and other soft tissues: Negative.   Disc levels:   T12-L1: No significant spinal canal or neural foraminal stenosis.   L1-2: Disc bulge and mild facet degenerative changes with associated epidural lipomatosis resulting in mild spinal canal stenosis and mild left neural foraminal narrowing.   L2-3: Disc bulge, mild to moderate facet degenerative changes and epidural lipomatosis resulting in moderate spinal canal stenosis and mild left neural foraminal narrowing.   L3-4: Disc bulge, mild-to-moderate facet degenerative changes and epidural lipomatosis resulting in moderate spinal canal stenosis, mild right and moderate left neural neural.   L4-5: Anterolisthesis, disc bulge with superimposed right foraminal disc protrusion and advanced facet degenerative changes with ligamentum flavum redundancy resulting in severe spinal canal stenosis, severe right and moderate left neural foraminal narrowing.   L5-S1: No spinal canal or neural foraminal stenosis.   IMPRESSION: 1. 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 2. 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 3. Severe right and moderate left neural foraminal narrowing at L4-5.     Electronically Signed   By: Baldemar Lenis M.D.   On: 02/26/2022 14:12  Imaging: No  results found.   PMFS History: Patient Active Problem List   Diagnosis Date Noted   Thoracic compression fracture (HCC) 05/13/2022   Spinal stenosis of lumbar region 05/13/2022   Type 2 diabetes mellitus without complication, without long-term current use of insulin (HCC) 03/17/2022   Past Medical History:  Diagnosis Date   Diabetes mellitus without complication (HCC)    Hypertension     Family History  Family history unknown: Yes    No past surgical history on file. Social History   Occupational History   Not on file  Tobacco Use   Smoking status: Never   Smokeless tobacco: Never  Vaping Use   Vaping Use: Never used  Substance and Sexual Activity   Alcohol use: Never   Drug use: Never   Sexual activity: Not Currently

## 2022-05-14 NOTE — Congregational Nurse Program (Signed)
Home visit with interpreter Diu Hartshorn.  Delivered Percocet which was picked up from [facility] on [address].  Patient stated he was having a lot of pain when we arrived.  Told patient that he would not be able to get more of the pain medicine due to reasons discussed at yesterday"s appointment (regarding addiction).  Recommended he try 1/2 tablet to see if that was helpful and if not after 1 hour he could take the 2nd half.  Patient reported 1 hour later that it had helped so we recommended he only take 1/2 tablet as needed.  BP today is 100/70 and blood glucose 108 non-fasting.  Taking diabetes medications as directed but had placed Amlodipine in morning and evening slots in pill box.  Not sure how long he had been taking increased dose but we explained proper dose and removed extra tablet from box.  Brantley Fling RN, Congregational Nurse [phone]

## 2022-05-15 ENCOUNTER — Other Ambulatory Visit: Payer: Self-pay

## 2022-05-15 ENCOUNTER — Other Ambulatory Visit: Payer: Self-pay | Admitting: Nurse Practitioner

## 2022-05-15 ENCOUNTER — Other Ambulatory Visit (HOSPITAL_COMMUNITY): Payer: Self-pay

## 2022-05-15 DIAGNOSIS — G6289 Other specified polyneuropathies: Secondary | ICD-10-CM

## 2022-05-15 MED ORDER — GABAPENTIN 300 MG PO CAPS
300.0000 mg | ORAL_CAPSULE | Freq: Three times a day (TID) | ORAL | 2 refills | Status: AC
  Filled 2022-05-15: qty 90, 30d supply, fill #0

## 2022-05-18 ENCOUNTER — Emergency Department (HOSPITAL_COMMUNITY)
Admission: EM | Admit: 2022-05-18 | Discharge: 2022-05-19 | Disposition: A | Discharge status: Home / Self Care | Payer: Medicaid Other | Attending: Emergency Medicine | Admitting: Emergency Medicine

## 2022-05-18 ENCOUNTER — Encounter (HOSPITAL_COMMUNITY): Payer: Self-pay | Admitting: Emergency Medicine

## 2022-05-18 DIAGNOSIS — S22080D Wedge compression fracture of T11-T12 vertebra, subsequent encounter for fracture with routine healing: Secondary | ICD-10-CM

## 2022-05-18 DIAGNOSIS — X58XXXD Exposure to other specified factors, subsequent encounter: Secondary | ICD-10-CM | POA: Diagnosis not present

## 2022-05-18 DIAGNOSIS — S22088D Other fracture of T11-T12 vertebra, subsequent encounter for fracture with routine healing: Secondary | ICD-10-CM | POA: Insufficient documentation

## 2022-05-18 DIAGNOSIS — Z79899 Other long term (current) drug therapy: Secondary | ICD-10-CM | POA: Insufficient documentation

## 2022-05-18 DIAGNOSIS — S299XXD Unspecified injury of thorax, subsequent encounter: Secondary | ICD-10-CM | POA: Diagnosis present

## 2022-05-18 DIAGNOSIS — Z7984 Long term (current) use of oral hypoglycemic drugs: Secondary | ICD-10-CM | POA: Insufficient documentation

## 2022-05-18 NOTE — ED Provider Triage Note (Signed)
Emergency Medicine Provider Triage Evaluation Note  Wayne Stokes , a 56 y.o. male  was evaluated in triage.  Pt complains of back pain.  Suffered compression fracture 05/07/22, had follow-up with [name] (ortho) 05/13/22.  No acute intervention warranted at this time.  Denies any new falls/trauma.  No change in his pain, just uncontrolled.  He denies bowel/bladder incontinence.  Last meds took at 9PM (oxycodone).  Review of Systems  Positive: Back pain Negative: fever  Physical Exam  BP (!) 128/91   Pulse 86   Temp 98.8 F (37.1 C)   Resp 18   SpO2 97%   Gen:   Awake, no distress   Resp:  Normal effort  MSK:   Moves extremities without difficulty  Other:  Easily moving legs on exam, no strength/sensory deficit noted  Medical Decision Making  Medically screening exam initiated at 10:42 PM.  Appropriate orders placed.  Richardson Odaniel was informed that the remainder of the evaluation will be completed by another provider, this initial triage assessment does not replace that evaluation, and the importance of remaining in the ED until their evaluation is complete.  Back pain.  Known T12 compression fracture without new injury/trauma.  No focal deficits noted in triage.   Garlon Hatchet, PA-C 05/18/22 2249

## 2022-05-18 NOTE — ED Triage Notes (Signed)
Per EMS, pt c/o Right sided back pain that radiates down leg.  C/o difficulty urinating.  Pt has full movement of leg.    138palp 86 pulse 98% RA

## 2022-05-19 ENCOUNTER — Other Ambulatory Visit: Payer: Self-pay

## 2022-05-19 ENCOUNTER — Other Ambulatory Visit (HOSPITAL_COMMUNITY): Payer: Self-pay

## 2022-05-19 LAB — URINALYSIS, ROUTINE W REFLEX MICROSCOPIC
Bilirubin Urine: NEGATIVE
Glucose, UA: NEGATIVE mg/dL
Hgb urine dipstick: NEGATIVE
Ketones, ur: NEGATIVE mg/dL
Leukocytes,Ua: NEGATIVE
Nitrite: NEGATIVE
Protein, ur: NEGATIVE mg/dL
Specific Gravity, Urine: 1.004 — ABNORMAL LOW (ref 1.005–1.030)
pH: 6 (ref 5.0–8.0)

## 2022-05-19 MED ORDER — OXYCODONE-ACETAMINOPHEN 5-325 MG PO TABS
1.0000 | ORAL_TABLET | ORAL | 0 refills | Status: DC | PRN
  Filled 2022-05-19: qty 12, 2d supply, fill #0

## 2022-05-19 MED ORDER — DICLOFENAC SODIUM 75 MG PO TBEC
75.0000 mg | DELAYED_RELEASE_TABLET | Freq: Two times a day (BID) | ORAL | 2 refills | Status: DC
  Filled 2022-05-19: qty 29, 15d supply, fill #0

## 2022-05-19 MED ORDER — OXYCODONE-ACETAMINOPHEN 5-325 MG PO TABS
2.0000 | ORAL_TABLET | Freq: Once | ORAL | Status: AC
  Administered 2022-05-19: 2 via ORAL
  Filled 2022-05-19: qty 2

## 2022-05-19 NOTE — Congregational Nurse Program (Signed)
Home visit with interpreter [name] assisting via phone.  Picked up the following 4 prescriptions from [facility] and took them to patient:  Gabapentin, Metformin, oxycodone-acetaminophen, and Diclofenac.  Added Gabapentin and Metformin to pill box and left Diclofenac and oxycodone on table beside bed.  He took 1 diclofenac and was instructed to take another one this evening and then 1 in the morning and 1 in the evening every day.  Also told him he could take 1 oxycodone every 4 hours as needed for severe pain.  Will follow-up with patient in 2 days.  Doristine Section, [place] Nurse [phone]

## 2022-05-19 NOTE — ED Provider Notes (Signed)
[facility] EMERGENCY DEPARTMENT Provider Note   CSN: [id number] Arrival date & time: 05/18/22  2240     History  Chief Complaint  Patient presents with   Back Pain    Wayne Stokes is a 56 y.o. male.  Pt complains of severe back pain.  Pt was seen in the  ED 8/30 and diagnosed with a compression fracture.  Pt reports he saw [name] Orthopaedist on 9/6.  He is scheduled to recheck in 1 month.    The history is provided by the patient. No language interpreter was used.  Back Pain Timing:  Constant Progression:  Worsening Chronicity:  New Context: not lifting heavy objects   Relieved by:  Nothing Ineffective treatments:  None tried Associated symptoms: no weakness        Home Medications Prior to Admission medications   Medication Sig Start Date End Date Taking? Authorizing Provider  amLODipine (NORVASC) 5 MG tablet Take 1 tablet (5 mg total) by mouth daily. 03/13/22 06/11/22  [name], [name], NP  atorvastatin (LIPITOR) 40 MG tablet Take 1 tablet (40 mg total) by mouth daily. 11/18/21   [name], NP  clotrimazole-betamethasone (LOTRISONE) cream Apply 1 Application topically daily. 03/17/22   Ivonne Andrew, NP  donepezil (ARICEPT) 5 MG tablet Take 1 tablet (5 mg total) by mouth at bedtime. 12/12/21   Windell Norfolk, MD  gabapentin (NEURONTIN) 300 MG capsule Take 1 capsule (300 mg total) by mouth 3 (three) times daily. 05/15/22 08/13/22  Ivonne Andrew, NP  glipiZIDE (GLUCOTROL) 5 MG tablet Take 1 tablet (5 mg total) by mouth 2 (two) times daily before a meal. 03/17/22   Ivonne Andrew, NP  losartan-hydrochlorothiazide (HYZAAR) 50-12.5 MG tablet Take 1 tablet by mouth daily. 12/16/21 05/16/22  [name], NP  metFORMIN (GLUCOPHAGE) 500 MG tablet Take 1 tablet (500 mg total) by mouth 2 (two) times daily with a meal. 11/15/21   [name], [name], NP  metoprolol succinate (TOPROL-XL) 25 MG 24 hr tablet Take 1 tablet (25 mg total) by mouth daily. 03/13/22  06/15/22  [name], [name], NP  Omega 3 1000 MG CAPS Take 1,000 mg by mouth daily.    [provider]  oxyCODONE-acetaminophen (PERCOCET/ROXICET) 5-325 MG tablet Take 1 tablet by mouth every 4 (four) hours as needed for severe pain. 05/13/22   Eldred Manges, MD  predniSONE (DELTASONE) 10 MG tablet Take 1 tablet (10 mg total) by mouth daily with breakfast. 04/08/22   Nadara Mustard, MD      Allergies    Beef-derived products, Pork-derived products, Shellfish allergy, Eggs or egg-derived products, and Other    Review of Systems   Review of Systems  Musculoskeletal:  Positive for back pain.  Neurological:  Negative for weakness.  All other systems reviewed and are negative.   Physical Exam Updated Vital Signs BP 127/87   Pulse 64   Temp 97.6 F (36.4 C) (Oral)   Resp 18   SpO2 96%  Physical Exam Vitals and nursing note reviewed.  Constitutional:      Appearance: He is well-developed.  HENT:     Head: Normocephalic.  Eyes:     Pupils: Pupils are equal, round, and reactive to light.  Cardiovascular:     Rate and Rhythm: Normal rate.  Pulmonary:     Effort: Pulmonary effort is normal.  Abdominal:     General: Abdomen is flat. There is no distension.  Musculoskeletal:  General: Normal range of motion.  Skin:    General: Skin is warm.  Neurological:     Mental Status: He is alert and oriented to person, place, and time.  Psychiatric:        Mood and Affect: Mood normal.     ED Results / Procedures / Treatments   Labs (all labs ordered are listed, but only abnormal results are displayed) Labs Reviewed  URINALYSIS, ROUTINE W REFLEX MICROSCOPIC - Abnormal; Notable for the following components:      Result Value   Color, Urine STRAW (*)    Specific Gravity, Urine 1.004 (*)    All other components within normal limits    EKG None  Radiology No results found.  Procedures Procedures    Medications Ordered in ED Medications  oxyCODONE-acetaminophen  (PERCOCET/ROXICET) 5-325 MG per tablet 2 tablet (2 tablets Oral Given 05/19/22 0702)    ED Course/ Medical Decision Making/ A&P                           Medical Decision Making Pt complains of low back pain   Amount and/or Complexity of Data Reviewed External Data Reviewed: notes.    Details: [name] orthopaedsit notes reviewed  Labs: ordered.  Risk Prescription drug management. Risk Details: Pt given pain medication her.  I will get 12 tablets.  I will try pt on voltaren            Final Clinical Impression(s) / ED Diagnoses Final diagnoses:  Compression fracture of T12 vertebra with routine healing, subsequent encounter    Rx / DC Orders ED Discharge Orders     None       An After Visit Summary was printed and given to the patient.    Elson Areas, PA-C 05/19/22 [id number]    Mardene Sayer, MD 05/19/22 [phone]

## 2022-05-20 ENCOUNTER — Other Ambulatory Visit (HOSPITAL_COMMUNITY): Payer: Self-pay

## 2022-05-21 NOTE — Congregational Nurse Program (Signed)
Home visit with interpreter Diu Hartshorn.  Patient expresses continued severe pain in coccyx area radiating down both legs.  Has not taken oxycodone since 7:00 pm yesterday because he does not want to take took much or run out without being able to get refills.  Recommended he take pain medication as well as diclofenac stat.  Delivered metoprolol which was picked up from [facility] and filled pill box for 1 week.  Will contact PCP regarding ongoing pain since he does not have follow-up appointment with orthopedist until 06/13/2022.  Brantley Fling RN, Congregational Nurse [phone]

## 2022-05-26 ENCOUNTER — Emergency Department (HOSPITAL_COMMUNITY): Payer: Medicaid Other

## 2022-05-26 ENCOUNTER — Inpatient Hospital Stay (HOSPITAL_COMMUNITY)
Admission: EM | Admit: 2022-05-26 | Discharge: 2022-08-01 | DRG: 477 | Disposition: A | Discharge status: Skilled Nursing Facility | Payer: Medicaid Other | Attending: Internal Medicine | Admitting: Internal Medicine

## 2022-05-26 ENCOUNTER — Other Ambulatory Visit: Payer: Self-pay

## 2022-05-26 ENCOUNTER — Encounter (HOSPITAL_COMMUNITY): Payer: Self-pay

## 2022-05-26 DIAGNOSIS — S22080A Wedge compression fracture of T11-T12 vertebra, initial encounter for closed fracture: Secondary | ICD-10-CM | POA: Diagnosis not present

## 2022-05-26 DIAGNOSIS — D72829 Elevated white blood cell count, unspecified: Secondary | ICD-10-CM | POA: Diagnosis present

## 2022-05-26 DIAGNOSIS — S22089A Unspecified fracture of T11-T12 vertebra, initial encounter for closed fracture: Principal | ICD-10-CM | POA: Diagnosis present

## 2022-05-26 DIAGNOSIS — E119 Type 2 diabetes mellitus without complications: Secondary | ICD-10-CM

## 2022-05-26 DIAGNOSIS — Z794 Long term (current) use of insulin: Secondary | ICD-10-CM

## 2022-05-26 DIAGNOSIS — R1312 Dysphagia, oropharyngeal phase: Secondary | ICD-10-CM | POA: Diagnosis present

## 2022-05-26 DIAGNOSIS — Z515 Encounter for palliative care: Secondary | ICD-10-CM | POA: Diagnosis not present

## 2022-05-26 DIAGNOSIS — R339 Retention of urine, unspecified: Secondary | ICD-10-CM | POA: Diagnosis present

## 2022-05-26 DIAGNOSIS — E785 Hyperlipidemia, unspecified: Secondary | ICD-10-CM | POA: Diagnosis present

## 2022-05-26 DIAGNOSIS — R197 Diarrhea, unspecified: Secondary | ICD-10-CM | POA: Diagnosis not present

## 2022-05-26 DIAGNOSIS — R6510 Systemic inflammatory response syndrome (SIRS) of non-infectious origin without acute organ dysfunction: Secondary | ICD-10-CM | POA: Diagnosis present

## 2022-05-26 DIAGNOSIS — Z597 Insufficient social insurance and welfare support: Secondary | ICD-10-CM

## 2022-05-26 DIAGNOSIS — E875 Hyperkalemia: Secondary | ICD-10-CM | POA: Diagnosis present

## 2022-05-26 DIAGNOSIS — E878 Other disorders of electrolyte and fluid balance, not elsewhere classified: Secondary | ICD-10-CM | POA: Diagnosis present

## 2022-05-26 DIAGNOSIS — M009 Pyogenic arthritis, unspecified: Secondary | ICD-10-CM | POA: Diagnosis present

## 2022-05-26 DIAGNOSIS — E8809 Other disorders of plasma-protein metabolism, not elsewhere classified: Secondary | ICD-10-CM | POA: Diagnosis present

## 2022-05-26 DIAGNOSIS — Z91012 Allergy to eggs: Secondary | ICD-10-CM

## 2022-05-26 DIAGNOSIS — M5442 Lumbago with sciatica, left side: Secondary | ICD-10-CM | POA: Diagnosis present

## 2022-05-26 DIAGNOSIS — Z79899 Other long term (current) drug therapy: Secondary | ICD-10-CM

## 2022-05-26 DIAGNOSIS — M5136 Other intervertebral disc degeneration, lumbar region: Secondary | ICD-10-CM | POA: Diagnosis present

## 2022-05-26 DIAGNOSIS — D509 Iron deficiency anemia, unspecified: Secondary | ICD-10-CM | POA: Diagnosis present

## 2022-05-26 DIAGNOSIS — G3184 Mild cognitive impairment, so stated: Secondary | ICD-10-CM | POA: Diagnosis not present

## 2022-05-26 DIAGNOSIS — B370 Candidal stomatitis: Secondary | ICD-10-CM | POA: Diagnosis present

## 2022-05-26 DIAGNOSIS — E1151 Type 2 diabetes mellitus with diabetic peripheral angiopathy without gangrene: Secondary | ICD-10-CM | POA: Diagnosis present

## 2022-05-26 DIAGNOSIS — R338 Other retention of urine: Secondary | ICD-10-CM | POA: Diagnosis not present

## 2022-05-26 DIAGNOSIS — I10 Essential (primary) hypertension: Secondary | ICD-10-CM

## 2022-05-26 DIAGNOSIS — M12831 Other specific arthropathies, not elsewhere classified, right wrist: Secondary | ICD-10-CM | POA: Diagnosis present

## 2022-05-26 DIAGNOSIS — E43 Unspecified severe protein-calorie malnutrition: Secondary | ICD-10-CM | POA: Diagnosis present

## 2022-05-26 DIAGNOSIS — Z6826 Body mass index (BMI) 26.0-26.9, adult: Secondary | ICD-10-CM

## 2022-05-26 DIAGNOSIS — M4646 Discitis, unspecified, lumbar region: Secondary | ICD-10-CM | POA: Diagnosis not present

## 2022-05-26 DIAGNOSIS — M48061 Spinal stenosis, lumbar region without neurogenic claudication: Secondary | ICD-10-CM | POA: Diagnosis not present

## 2022-05-26 DIAGNOSIS — D638 Anemia in other chronic diseases classified elsewhere: Secondary | ICD-10-CM | POA: Diagnosis present

## 2022-05-26 DIAGNOSIS — Z23 Encounter for immunization: Secondary | ICD-10-CM

## 2022-05-26 DIAGNOSIS — G8929 Other chronic pain: Secondary | ICD-10-CM | POA: Diagnosis present

## 2022-05-26 DIAGNOSIS — W19XXXA Unspecified fall, initial encounter: Secondary | ICD-10-CM | POA: Diagnosis present

## 2022-05-26 DIAGNOSIS — N401 Enlarged prostate with lower urinary tract symptoms: Secondary | ICD-10-CM | POA: Diagnosis present

## 2022-05-26 DIAGNOSIS — R7982 Elevated C-reactive protein (CRP): Secondary | ICD-10-CM | POA: Diagnosis not present

## 2022-05-26 DIAGNOSIS — D75839 Thrombocytosis, unspecified: Secondary | ICD-10-CM | POA: Diagnosis present

## 2022-05-26 DIAGNOSIS — Z20822 Contact with and (suspected) exposure to covid-19: Secondary | ICD-10-CM | POA: Diagnosis present

## 2022-05-26 DIAGNOSIS — N179 Acute kidney failure, unspecified: Secondary | ICD-10-CM | POA: Diagnosis present

## 2022-05-26 DIAGNOSIS — I152 Hypertension secondary to endocrine disorders: Secondary | ICD-10-CM | POA: Diagnosis present

## 2022-05-26 DIAGNOSIS — K224 Dyskinesia of esophagus: Secondary | ICD-10-CM | POA: Diagnosis present

## 2022-05-26 DIAGNOSIS — E876 Hypokalemia: Secondary | ICD-10-CM | POA: Diagnosis present

## 2022-05-26 DIAGNOSIS — Z7984 Long term (current) use of oral hypoglycemic drugs: Secondary | ICD-10-CM

## 2022-05-26 DIAGNOSIS — M25411 Effusion, right shoulder: Secondary | ICD-10-CM | POA: Diagnosis present

## 2022-05-26 DIAGNOSIS — M48062 Spinal stenosis, lumbar region with neurogenic claudication: Secondary | ICD-10-CM | POA: Diagnosis present

## 2022-05-26 DIAGNOSIS — E871 Hypo-osmolality and hyponatremia: Secondary | ICD-10-CM | POA: Diagnosis present

## 2022-05-26 DIAGNOSIS — M064 Inflammatory polyarthropathy: Secondary | ICD-10-CM | POA: Diagnosis present

## 2022-05-26 DIAGNOSIS — E1169 Type 2 diabetes mellitus with other specified complication: Secondary | ICD-10-CM | POA: Diagnosis present

## 2022-05-26 DIAGNOSIS — Z91014 Allergy to mammalian meats: Secondary | ICD-10-CM

## 2022-05-26 DIAGNOSIS — K5900 Constipation, unspecified: Secondary | ICD-10-CM | POA: Diagnosis not present

## 2022-05-26 DIAGNOSIS — M5441 Lumbago with sciatica, right side: Secondary | ICD-10-CM | POA: Diagnosis present

## 2022-05-26 DIAGNOSIS — E1165 Type 2 diabetes mellitus with hyperglycemia: Secondary | ICD-10-CM | POA: Diagnosis present

## 2022-05-26 DIAGNOSIS — E1159 Type 2 diabetes mellitus with other circulatory complications: Secondary | ICD-10-CM | POA: Diagnosis not present

## 2022-05-26 DIAGNOSIS — Z91018 Allergy to other foods: Secondary | ICD-10-CM

## 2022-05-26 DIAGNOSIS — M25511 Pain in right shoulder: Secondary | ICD-10-CM | POA: Clinically undetermined

## 2022-05-26 DIAGNOSIS — Z91013 Allergy to seafood: Secondary | ICD-10-CM

## 2022-05-26 DIAGNOSIS — M069 Rheumatoid arthritis, unspecified: Secondary | ICD-10-CM

## 2022-05-26 DIAGNOSIS — E663 Overweight: Secondary | ICD-10-CM | POA: Diagnosis present

## 2022-05-26 DIAGNOSIS — Z603 Acculturation difficulty: Secondary | ICD-10-CM | POA: Diagnosis present

## 2022-05-26 DIAGNOSIS — R110 Nausea: Secondary | ICD-10-CM | POA: Diagnosis not present

## 2022-05-26 LAB — CBC WITH DIFFERENTIAL/PLATELET
Abs Immature Granulocytes: 0.22 10*3/uL — ABNORMAL HIGH (ref 0.00–0.07)
Basophils Absolute: 0 10*3/uL (ref 0.0–0.1)
Basophils Relative: 0 %
Eosinophils Absolute: 0 10*3/uL (ref 0.0–0.5)
Eosinophils Relative: 0 %
HCT: 30.8 % — ABNORMAL LOW (ref 39.0–52.0)
Hemoglobin: 10 g/dL — ABNORMAL LOW (ref 13.0–17.0)
Immature Granulocytes: 2 %
Lymphocytes Relative: 18 %
Lymphs Abs: 2.6 10*3/uL (ref 0.7–4.0)
MCH: 23.6 pg — ABNORMAL LOW (ref 26.0–34.0)
MCHC: 32.5 g/dL (ref 30.0–36.0)
MCV: 72.6 fL — ABNORMAL LOW (ref 80.0–100.0)
Monocytes Absolute: 1.5 10*3/uL — ABNORMAL HIGH (ref 0.1–1.0)
Monocytes Relative: 10 %
Neutro Abs: 10 10*3/uL — ABNORMAL HIGH (ref 1.7–7.7)
Neutrophils Relative %: 70 %
Platelets: 548 10*3/uL — ABNORMAL HIGH (ref 150–400)
RBC: 4.24 MIL/uL (ref 4.22–5.81)
RDW: 15.4 % (ref 11.5–15.5)
WBC: 14.4 10*3/uL — ABNORMAL HIGH (ref 4.0–10.5)
nRBC: 0 % (ref 0.0–0.2)

## 2022-05-26 LAB — URINALYSIS, ROUTINE W REFLEX MICROSCOPIC
Bilirubin Urine: NEGATIVE
Glucose, UA: 50 mg/dL — AB
Hgb urine dipstick: NEGATIVE
Ketones, ur: NEGATIVE mg/dL
Leukocytes,Ua: NEGATIVE
Nitrite: NEGATIVE
Protein, ur: NEGATIVE mg/dL
Specific Gravity, Urine: 1.004 — ABNORMAL LOW (ref 1.005–1.030)
pH: 7 (ref 5.0–8.0)

## 2022-05-26 LAB — CBG MONITORING, ED: Glucose-Capillary: 91 mg/dL (ref 70–99)

## 2022-05-26 LAB — COMPREHENSIVE METABOLIC PANEL
ALT: 11 U/L (ref 0–44)
AST: 21 U/L (ref 15–41)
Albumin: 2.7 g/dL — ABNORMAL LOW (ref 3.5–5.0)
Alkaline Phosphatase: 83 U/L (ref 38–126)
Anion gap: 15 (ref 5–15)
BUN: 13 mg/dL (ref 6–20)
CO2: 23 mmol/L (ref 22–32)
Calcium: 8.7 mg/dL — ABNORMAL LOW (ref 8.9–10.3)
Chloride: 89 mmol/L — ABNORMAL LOW (ref 98–111)
Creatinine, Ser: 1.42 mg/dL — ABNORMAL HIGH (ref 0.61–1.24)
GFR, Estimated: 58 mL/min — ABNORMAL LOW (ref >60)
Glucose, Bld: 60 mg/dL — ABNORMAL LOW (ref 70–99)
Potassium: 4 mmol/L (ref 3.5–5.1)
Sodium: 127 mmol/L — ABNORMAL LOW (ref 135–145)
Total Bilirubin: 0.4 mg/dL (ref 0.3–1.2)
Total Protein: 6.9 g/dL (ref 6.5–8.1)

## 2022-05-26 MED ORDER — MORPHINE SULFATE (PF) 4 MG/ML IV SOLN
4.0000 mg | Freq: Once | INTRAVENOUS | Status: AC
  Administered 2022-05-26: 4 mg via INTRAVENOUS
  Filled 2022-05-26: qty 1

## 2022-05-26 MED ORDER — SODIUM CHLORIDE 0.9 % IV BOLUS
1000.0000 mL | Freq: Once | INTRAVENOUS | Status: AC
  Administered 2022-05-26: 1000 mL via INTRAVENOUS

## 2022-05-26 MED ORDER — ONDANSETRON HCL 4 MG/2ML IJ SOLN
4.0000 mg | Freq: Four times a day (QID) | INTRAMUSCULAR | Status: DC | PRN
  Administered 2022-06-01: 4 mg via INTRAVENOUS
  Filled 2022-05-26 (×2): qty 2

## 2022-05-26 MED ORDER — ONDANSETRON HCL 4 MG/2ML IJ SOLN
4.0000 mg | Freq: Once | INTRAMUSCULAR | Status: AC
  Administered 2022-05-26: 4 mg via INTRAVENOUS
  Filled 2022-05-26: qty 2

## 2022-05-26 MED ORDER — ACETAMINOPHEN 500 MG PO TABS: 1000.0000 mg | ORAL_TABLET | Freq: Four times a day (QID) | ORAL | Status: DC | PRN

## 2022-05-26 MED ORDER — ONDANSETRON HCL 4 MG PO TABS
4.0000 mg | ORAL_TABLET | Freq: Four times a day (QID) | ORAL | Status: DC | PRN
  Administered 2022-06-18 – 2022-07-16 (×9): 4 mg via ORAL
  Filled 2022-05-26 (×10): qty 1

## 2022-05-26 MED ORDER — OXYCODONE HCL 5 MG PO TABS
5.0000 mg | ORAL_TABLET | ORAL | Status: DC | PRN
  Administered 2022-05-27: 5 mg via ORAL
  Filled 2022-05-26: qty 1

## 2022-05-26 MED ORDER — HYDROMORPHONE HCL 1 MG/ML IJ SOLN
1.0000 mg | INTRAMUSCULAR | Status: DC | PRN
  Administered 2022-05-27: 1 mg via INTRAVENOUS
  Filled 2022-05-26 (×2): qty 1

## 2022-05-26 MED ORDER — HYDROMORPHONE HCL 1 MG/ML IJ SOLN
0.5000 mg | Freq: Once | INTRAMUSCULAR | Status: AC
  Administered 2022-05-26: 0.5 mg via INTRAVENOUS
  Filled 2022-05-26: qty 1

## 2022-05-26 MED ORDER — KETOROLAC TROMETHAMINE 30 MG/ML IJ SOLN
30.0000 mg | Freq: Once | INTRAMUSCULAR | Status: AC
Start: 2022-05-26 — End: 2022-05-26
  Administered 2022-05-26: 30 mg via INTRAVENOUS
  Filled 2022-05-26: qty 1

## 2022-05-26 MED ORDER — ACETAMINOPHEN 650 MG RE SUPP: 650.0000 mg | Freq: Four times a day (QID) | RECTAL | Status: DC | PRN

## 2022-05-26 MED ORDER — SENNOSIDES-DOCUSATE SODIUM 8.6-50 MG PO TABS
1.0000 | ORAL_TABLET | Freq: Every evening | ORAL | Status: DC | PRN
  Administered 2022-05-28: 1 via ORAL
  Filled 2022-05-26: qty 1

## 2022-05-26 NOTE — ED Provider Notes (Signed)
[facility] DEPT Provider Note   CSN: [id number] Arrival date & time: 05/26/22  1226     History  Chief Complaint  Patient presents with   Back Pain    Wayne Stokes is a 56 y.o. male w/ hx of T12 compression fx, lumbar DDD, presenting to Ed with back pain.  The patient presents in the company of a Solicitor at the bedside, and tells me he has been having horrible pain in his back for many weeks.  He has not been able to walk this past week due to pain.  He says he intermittently has difficult times vacating his bowels and also urinating.  He says he last urinated yesterday.  He feels he is drinking normally.  He reports the pain is in his lower back rating down his left buttock to his mid thigh.  It is worse with walking.  On record review the patient has been seen several times by orthopedics doctors including by Dr. Rodell Perna from [facility] on 05/13/22, and is undergone localized injections into his spine in the past.  They were planning to have him seen by another specialist for L4-L5 fusion once his T spine fracture heals.   HPI     Home Medications Prior to Admission medications   Medication Sig Start Date End Date Taking? Authorizing Provider  amLODipine (NORVASC) 5 MG tablet Take 1 tablet (5 mg total) by mouth daily. 03/13/22 06/11/22  [name], [name], NP  atorvastatin (LIPITOR) 40 MG tablet Take 1 tablet (40 mg total) by mouth daily. 11/18/21   [name], NP  clotrimazole-betamethasone (LOTRISONE) cream Apply 1 Application topically daily. 03/17/22   [name], NP  diclofenac (VOLTAREN) 75 MG EC tablet Take 1 tablet (75 mg total) by mouth 2 (two) times daily. 05/19/22   [name], PA-C  donepezil (ARICEPT) 5 MG tablet Take 1 tablet (5 mg total) by mouth at bedtime. 12/12/21   [name], MD  gabapentin (NEURONTIN) 300 MG capsule Take 1 capsule (300 mg total) by mouth 3 (three) times daily. 05/15/22 08/13/22  [name], NP  glipiZIDE (GLUCOTROL) 5 MG tablet Take 1 tablet (5 mg total) by mouth 2 (two) times daily before a meal. 03/17/22   [name], NP  losartan-hydrochlorothiazide (HYZAAR) 50-12.5 MG tablet Take 1 tablet by mouth daily. 12/16/21 05/16/22  [name], NP  metFORMIN (GLUCOPHAGE) 500 MG tablet Take 1 tablet (500 mg total) by mouth 2 (two) times daily with a meal. 11/15/21   [name], [name], NP  metoprolol succinate (TOPROL-XL) 25 MG 24 hr tablet Take 1 tablet (25 mg total) by mouth daily. 03/13/22 06/18/22  [name], [name], NP  Omega 3 1000 MG CAPS Take 1,000 mg by mouth daily.    [provider]  oxyCODONE-acetaminophen (PERCOCET) 5-325 MG tablet Take 1 tablet by mouth every 4 (four) hours as needed for severe pain. 05/19/22 05/19/23  [name], PA-C      Allergies    Beef-derived products, Pork-derived products, Shellfish allergy, Eggs or egg-derived products, and Other    Review of Systems   Review of Systems  Physical Exam Updated Vital Signs BP 113/73   Pulse 64   Temp 97.8 F (36.6 C) (Oral)   Resp 18   SpO2 97%  Physical Exam Constitutional:      General: He is not in acute distress. HENT:     Head: Normocephalic and atraumatic.  Eyes:  Conjunctiva/sclera: Conjunctivae normal.     Pupils: Pupils are equal, round, and reactive to light.  Cardiovascular:     Rate and Rhythm: Normal rate and regular rhythm.  Pulmonary:     Effort: Pulmonary effort is normal. No respiratory distress.  Abdominal:     General: There is no distension.     Tenderness: There is no abdominal tenderness.  Skin:    General: Skin is warm and dry.  Neurological:     General: No focal deficit present.     Mental Status: He is alert.     Comments: Patient is able to move his lower extremities easily, not able to ambulate, appears limited by pain  Psychiatric:        Mood and Affect: Mood normal.        Behavior: Behavior normal.     ED Results / Procedures /  Treatments   Labs (all labs ordered are listed, but only abnormal results are displayed) Labs Reviewed  COMPREHENSIVE METABOLIC PANEL - Abnormal; Notable for the following components:      Result Value   Sodium 127 (*)    Chloride 89 (*)    Glucose, Bld 60 (*)    Creatinine, Ser 1.42 (*)    Calcium 8.7 (*)    Albumin 2.7 (*)    GFR, Estimated 58 (*)    All other components within normal limits  CBC WITH DIFFERENTIAL/PLATELET - Abnormal; Notable for the following components:   WBC 14.4 (*)    Hemoglobin 10.0 (*)    HCT 30.8 (*)    MCV 72.6 (*)    MCH 23.6 (*)    Platelets 548 (*)    Neutro Abs 10.0 (*)    Monocytes Absolute 1.5 (*)    Abs Immature Granulocytes 0.22 (*)    All other components within normal limits  URINALYSIS, ROUTINE W REFLEX MICROSCOPIC - Abnormal; Notable for the following components:   Color, Urine STRAW (*)    Specific Gravity, Urine 1.004 (*)    Glucose, UA 50 (*)    All other components within normal limits  HIV ANTIBODY (ROUTINE TESTING W REFLEX)  BASIC METABOLIC PANEL  CBC  VITAMIN B12  FOLATE  IRON AND TIBC  FERRITIN  CBG MONITORING, ED    EKG None  Radiology MR LUMBAR SPINE WO CONTRAST  Result Date: 05/26/2022 CLINICAL DATA:  Initial evaluation for acute low back pain. EXAM: MRI LUMBAR SPINE WITHOUT CONTRAST TECHNIQUE: Multiplanar, multisequence MR imaging of the lumbar spine was performed. No intravenous contrast was administered. COMPARISON:  Prior CT from 05/07/2022. FINDINGS: Segmentation:  Examination moderately degraded by motion artifact. Standard segmentation. Lowest well-formed disc space labeled the L5-S1 level. Alignment: Dextroscoliosis. Underlying straightening and mild reversal of the normal lumbar lordosis. Trace anterolisthesis of L4 on L5, likely degenerative. Vertebrae: Acute compression fracture involving the T12 vertebral body with up to 30% height loss and trace 2-3 mm bony retropulsion. Mild vertebral body height loss about  the L4-5 interbody space, likely degenerative. Otherwise, vertebral body height maintained with no other acute or chronic fracture. Underlying bone marrow signal intensity within normal limits. No worrisome osseous lesions. Reactive marrow edema about the L4-5 interspace favored to be degenerative in nature. Possible changes of superimposed infection difficult to exclude, and could be considered in the correct clinical setting. Conus medullaris and cauda equina: Conus extends to approximately the L1 level. Conus medullaris grossly within normal limits on this motion degraded exam. Nerve roots of the cauda equina grossly within  normal limits as well, although evaluation limited by motion. Paraspinal and other soft tissues: Mild paraspinous edema adjacent to the L4-5 interspace, favored to be reactive. Paraspinous soft tissues demonstrate no other acute finding. Disc levels: A degree of underlying congenital spinal stenosis noted. T12-L1: Trace 2-3 mm bony retropulsion related to the T12 compression fracture. Underlying mild disc bulge, eccentric to the left. Associated annular fissure. Mild facet hypertrophy. No significant spinal stenosis. Mild left foraminal narrowing. Right neural foramen remains patent. L1-2: Disc desiccation with mild disc bulge and reactive endplate spurring. No significant spinal stenosis. Foramina remain patent. L2-3: Disc desiccation with mild disc bulge and reactive endplate spurring. Mild facet hypertrophy. Epidural lipomatosis. Underlying short pedicles with resultant mild-to-moderate spinal stenosis. Mild right L2 foraminal narrowing. Left neural foramen remains patent. L3-4: Disc desiccation with mild disc bulge and reactive endplate spurring. Mild facet hypertrophy. Epidural lipomatosis. Changes superimposed on underlying short pedicles resultant mild-to-moderate spinal stenosis. Mild right with mild-to-moderate left L3 foraminal stenosis. L4-5: Advanced degenerative intervertebral  disc space narrowing with disc desiccation and diffuse disc bulge. Reactive endplate change with endplate osteophytic spurring and marrow edema. Probable large left extraforaminal/far lateral disc extrusion with inferior migration, better seen on prior CT from 05/07/2022 (series 3, image 103 on that exam). Moderate bilateral facet arthrosis. Changes superimposed on short pedicles resultant severe spinal stenosis. Severe bilateral L4 foraminal narrowing. L5-S1: Negative interspace. Minimal facet spurring with epidural lipomatosis. No significant spinal stenosis. Foramina remain patent. IMPRESSION: 1. Acute compression fracture involving the T12 vertebral body with up to 30% height loss and trace 2-3 mm bony retropulsion. No significant spinal stenosis. 2. Advanced degenerative disc disease and facet arthrosis at L4-5 with resultant severe spinal stenosis, with severe bilateral L4 foraminal narrowing. Reactive marrow edema about this level favored to be degenerative in nature, although possible changes of infection/osteomyelitis discitis difficult to exclude, and could be considered in the correct clinical setting. Correlation with physical exam and laboratory values recommended. Electronically Signed   By: Jeannine Boga M.D.   On: 05/26/2022 19:24    Procedures Procedures    Medications Ordered in ED Medications  acetaminophen (TYLENOL) tablet 1,000 mg (has no administration in time range)    Or  acetaminophen (TYLENOL) suppository 650 mg (has no administration in time range)  oxyCODONE (Oxy IR/ROXICODONE) immediate release tablet 5 mg (has no administration in time range)  HYDROmorphone (DILAUDID) injection 1 mg (has no administration in time range)  senna-docusate (Senokot-S) tablet 1 tablet (has no administration in time range)  ondansetron (ZOFRAN) tablet 4 mg (has no administration in time range)    Or  ondansetron (ZOFRAN) injection 4 mg (has no administration in time range)  morphine  (PF) 4 MG/ML injection 4 mg (4 mg Intravenous Given 05/26/22 1723)  ondansetron (ZOFRAN) injection 4 mg (4 mg Intravenous Given 05/26/22 1724)  sodium chloride 0.9 % bolus 1,000 mL (0 mLs Intravenous Stopped 05/26/22 1853)  HYDROmorphone (DILAUDID) injection 0.5 mg (0.5 mg Intravenous Given 05/26/22 2117)  ketorolac (TORADOL) 30 MG/ML injection 30 mg (30 mg Intravenous Given 05/26/22 2116)    ED Course/ Medical Decision Making/ A&P Clinical Course as of 05/26/22 2356  Mon May 26, 2022  1702 Bladder scan with nearly 600 cc of urine the patient feels he cannot void.  I have asked that a Foley be inserted. [MT]  2008 I spoke to Dr Ninfa Linden from [facility] who advised that this remains likely still a nonemergent issue, more likely related to pain control, however would be  reasonable to admit the patient to the hospitalist at this point and transfer to [facility] for Ortho evaluation - they will need to notify Dr Lorin Mercy who had seen the patient recently.  It is not clear if anyone in their practice is currently able to provide the spinal surgical procedure [MT]    Clinical Course User Index [MT] Elfida Shimada, Carola Rhine, MD                           Medical Decision Making Amount and/or Complexity of Data Reviewed Radiology: ordered.  Risk Prescription drug management. Decision regarding hospitalization.   Patient presenting with intractable back pain from home, reporting has not been able to walk all week.  Also reporting difficulties with urinating and stooling at home.  He does not have any neurological deficits suggestive of cord compression, but was found to be retaining urine in his bladder over 500 cc, subsequently underwent bladder catheterization.  Procedure viewed interpreted patient's labs and imaging.  Blood tests are notable for hyponatremia with sodium 127, hypochloremia, hypoalbuminemia.  This is likely related to poor dietary intake, although the patient reports that he is eating and  drinking "normally".  UA also shows no evidence of infection.  White blood cell count is elevated but the patient reported that he had a steroid injection in his back recently.  I have a lower suspicion for sepsis.  MRI of the lumbar spine was ordered and reviewed, with L4-L5 foraminal narrowing.  There is question or suggestion of a possible underlying infection, although the patient has no risk factors suggest this, including instrumentation or surgery of the lumbar spine or IV drug use history.  He does not have meningitic signs on exam. I do not believe he is needing antibiotics at this time.  Patient will be admitted for pain control and for urinary retention assessment. I discussed the case with orthopedist Dr Ninfa Linden from [facility] who advised hospitalist admission and transfer of the patient to [facility], where his team will notify Dr Lorin Mercy for further planning.  The patient may require PT assessment and possible rehab placement.  Supplement history is provided by the patient's family friend who was present at the bedside, who is a Equities trader and a church member of the patient.  She confirms that he has had a rapid decline in his function over the past 2 to 3 weeks even, and that he has not been able to walk at all at home due to pain.  A [place] translator was used for entire history and exam.          Final Clinical Impression(s) / ED Diagnoses Final diagnoses:  Chronic low back pain with bilateral sciatica, unspecified back pain laterality  Spinal stenosis at L4-L5 level  Hyponatremia  Urinary retention    Rx / DC Orders ED Discharge Orders     None         Eagle Pitta, Carola Rhine, MD 05/26/22 2357

## 2022-05-26 NOTE — Inpatient Diabetes Management (Signed)
Inpatient Diabetes Program Recommendations  AACE/ADA: New Consensus Statement on Inpatient Glycemic Control (2015)  Target Ranges:  Prepandial:   less than 140 mg/dL      Peak postprandial:   less than 180 mg/dL (1-2 hours)      Critically ill patients:  140 - 180 mg/dL   Lab Results  Component Value Date   HGBA1C 7.4 (A) 03/17/2022    Review of Glycemic Control  Latest Reference Range & Units 05/26/22 13:20  Glucose 70 - 99 mg/dL 60 (L)  (L): Data is abnormally low  Diabetes history:  DM2  Outpatient Diabetes medications:  Glipizide 5 mg BID Metformin 1000 mg BID  Current orders for Inpatient glycemic control:  None  Inpatient Diabetes Program Recommendations:    Please order CBG's ac/hs.  Serum glucose was 60 mg/dL at 13:20.  Please follow up with CBG.    Will continue to follow while inpatient.  Thank you, Reche Dixon, MSN, [place] Diabetes Coordinator Inpatient Diabetes Program [phone] (team pager from 8a-5p)

## 2022-05-26 NOTE — ED Notes (Signed)
Orange juice provided and will re-check cbg

## 2022-05-26 NOTE — Assessment & Plan Note (Signed)
T12 compression fracture Uncontrolled pain and difficulty walking due to compression fracture and lumbar spinal stenosis.  ROM and strength intact on exam.  Has new urinary retention (possibly from narcotic meds) but no alarm features or suggestion of cauda equina on imaging. -Orthopedics recommend admission to [facility], they will inform Dr. Lorin Mercy for consultation -Continue gabapentin -Analgesics as needed

## 2022-05-26 NOTE — Assessment & Plan Note (Signed)
Acute urinary retention Mild AKI, Cr improved to 1.2 today. - Foley catheter placed in ED - Avoid diclofenac, losartan-HCTZ, metformin

## 2022-05-26 NOTE — ED Notes (Signed)
Pt transported to MRI 

## 2022-05-26 NOTE — Assessment & Plan Note (Signed)
WBC 14.4 on admission.  No clear sign of infection.  Continue to monitor.

## 2022-05-26 NOTE — Assessment & Plan Note (Signed)
Continue donepezil 

## 2022-05-26 NOTE — ED Triage Notes (Signed)
BIBA for lower back pain Compression fx on 8/30 and prescribed oxycodone and orthopedic f/u Oxycodone last @ 0800

## 2022-05-26 NOTE — Assessment & Plan Note (Signed)
Continue atorvastatin

## 2022-05-26 NOTE — ED Provider Triage Note (Signed)
Emergency Medicine Provider Triage Evaluation Note  Wake Humbarger , a 56 y.o. male  was evaluated in triage.  Pt complains of back pain. H/o compression fracture on aug 30th. Sharp pain in waist and both legs. Not able to get up and use the restrooms because the pain is so severe. Started a week ago. Now unable to stand up or walk. Usually [employer]. Language barrier is an issue and no interpreter available at this time.   Chart review: compression fracture T12  Review of Systems  Positive: See above Negative: See above  Physical Exam  BP 105/85 (BP Location: Left Arm)   Pulse 79   Temp 97.7 F (36.5 C) (Oral)   Resp 20   SpO2 100%  Gen:   Awake, in distress Resp:  Normal effort  MSK:   Moves extremities without difficulty. Unwilling to move lower extremities d/t pain. Other:    Medical Decision Making  Medically screening exam initiated at 1:11 PM.  Appropriate orders placed.  Rodney Volkman was informed that the remainder of the evaluation will be completed by another provider, this initial triage assessment does not replace that evaluation, and the importance of remaining in the ED until their evaluation is complete.  Plan: morphine/zofran,    Harriet Pho, PA-C 05/26/22 1322

## 2022-05-26 NOTE — Assessment & Plan Note (Addendum)
-   Hold metformin and glipizide - SS correction insulin

## 2022-05-26 NOTE — Hospital Course (Addendum)
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 Neurosurgery and orthopedics were consulted recommended TLSO brace and pain control. IR consulted for kyphoplasty.  Completed on 06/18/2022. There was concern for right shoulder septic arthritis.  ID was consulted orthopedics  consult.  Unable to aspirate the joint.  Antibiotics were discontinued. Was seen by dietitian and on 11/10 started on a core track tube feed.  Speech therapy following for poor swallowing function.

## 2022-05-26 NOTE — Assessment & Plan Note (Addendum)
Hgb stable 

## 2022-05-26 NOTE — H&P (Signed)
Initial vitals showed BP History and Physical    Wayne Stokes:[id number] DOB: [birth year] DOA: 05/26/2022  PCP: [name], NP  Patient coming from: Home  I have personally briefly reviewed patient's old medical records in [facility]  Chief Complaint: Lower back pain  HPI: Wayne Stokes is a [name] speaking 56 y.o. 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  Patient has known lumbar disc disease and T12 compression fracture.  He has been having significant lower back pain for several weeks.  This last week he has not been able to ambulate well due to his pain.  Pain radiates down his left buttocks to mid thigh.  He has also had difficulty with urination and vacating his bowels over the last day.  He follows with orthopedics, [name].  Last seen in office on 05/13/2022.  There is plan for evaluation by [name] for possible L4-5 fusion with decompression once compression fracture is healed.  ED Course  Labs/Imaging on admission: I have personally reviewed following labs and imaging studies.  Initial vitals showed BP 105/85, pulse 79, RR 20, temp 97.7 F, SPO2 100% on room air.  Labs show WBC 14.4, hemoglobin 10.0, platelets 548,000, sodium 127, potassium 4.0, bicarb 23, BUN 13, creatinine 1.42, serum glucose 60.  Urinalysis negative for UTI.    Patient was given orange juice and repeat CBG 91.  Bladder scan showed 600 cc urine in the bladder.  Foley catheter was placed in the ED.  MRI lumbar spine showed acute compression fracture involving T12 vertebral body with up to 30% height loss and trace to-3 mm bony retropulsion, no significant spinal stenosis at this level.  Advanced degenerative disc disease and facet arthrosis at L4-5 with severe spinal stenosis and severe  bilateral L4 foraminal narrowing noted.  Reactive marrow edema at this level was favored to be degenerative in nature.  Patient was given 1 L normal saline, 4 mg IV morphine, 30 mg IV Toradol, 0.5 mg IV Dilaudid.  EDP discussed with on-call [facility], [name], who recommended admission to [facility] and will discuss with patient's primary orthopedic surgeon [name] for further evaluation in hospital.  The hospitalist service was consulted to admit for further evaluation and management.  Review of Systems: All systems reviewed and are negative except as documented in history of present illness above.   Past Medical History:  Diagnosis Date   Diabetes mellitus without complication (HCC)    Hypertension     History reviewed. No pertinent surgical history.  Social History:  reports that he has never smoked. He has never used smokeless tobacco. He reports that he does not drink alcohol and does not use drugs.  Allergies  Allergen Reactions   Beef-Derived Products Swelling   Pork-Derived Products Swelling   Shellfish Allergy Rash   Eggs Or Egg-Derived Products Swelling   Other Other (See Comments)    Egg plant , pizza - swelling    Family History  Family history unknown: Yes     Prior to Admission medications   Medication Sig Start Date End Date Taking? Authorizing Provider  amLODipine (NORVASC) 5 MG tablet Take 1 tablet (5 mg total) by mouth daily. 03/13/22 06/11/22  [name], [name], NP  atorvastatin (LIPITOR) 40 MG tablet Take 1 tablet (40 mg total) by mouth daily.  11/18/21   [name], NP  clotrimazole-betamethasone (LOTRISONE) cream Apply 1 Application topically daily. 03/17/22   [name], NP  diclofenac (VOLTAREN) 75 MG EC tablet Take 1 tablet (75 mg total) by mouth 2 (two) times daily. 05/19/22   [name], PA-C  donepezil (ARICEPT) 5 MG tablet Take 1 tablet (5 mg total) by mouth at bedtime. 12/12/21   [name], MD  gabapentin  (NEURONTIN) 300 MG capsule Take 1 capsule (300 mg total) by mouth 3 (three) times daily. 05/15/22 08/13/22  [name], NP  glipiZIDE (GLUCOTROL) 5 MG tablet Take 1 tablet (5 mg total) by mouth 2 (two) times daily before a meal. 03/17/22   [name], NP  losartan-hydrochlorothiazide (HYZAAR) 50-12.5 MG tablet Take 1 tablet by mouth daily. 12/16/21 05/16/22  [name], NP  metFORMIN (GLUCOPHAGE) 500 MG tablet Take 1 tablet (500 mg total) by mouth 2 (two) times daily with a meal. 11/15/21   [name], [name], NP  metoprolol succinate (TOPROL-XL) 25 MG 24 hr tablet Take 1 tablet (25 mg total) by mouth daily. 03/13/22 06/18/22  [name], [name], NP  Omega 3 1000 MG CAPS Take 1,000 mg by mouth daily.    [provider]  oxyCODONE-acetaminophen (PERCOCET) 5-325 MG tablet Take 1 tablet by mouth every 4 (four) hours as needed for severe pain. 05/19/22 05/19/23  [name], PA-C    Physical Exam: Vitals:   05/26/22 1755 05/26/22 2030 05/26/22 2100 05/26/22 2130  BP:  106/74 113/67 113/73  Pulse:  76  64  Resp:  17  18  Temp: 97.9 F (36.6 C)   97.8 F (36.6 C)  TempSrc:    Oral  SpO2:  98%  97%   Constitutional: Resting in bed, NAD, calm, comfortable Eyes: EOMI, lids and conjunctivae normal ENMT: Mucous membranes are moist. Posterior pharynx clear of any exudate or lesions.Normal dentition.  Neck: normal, supple, no masses. Respiratory: clear to auscultation bilaterally, no wheezing, no crackles. Normal respiratory effort. No accessory muscle use.  Cardiovascular: Regular rate and rhythm, no murmurs / rubs / gallops. No extremity edema. 2+ pedal pulses. Abdomen: no tenderness, no masses palpated. Musculoskeletal: no clubbing / cyanosis. No joint deformity upper and lower extremities. Good ROM, no contractures. Normal muscle tone.  Skin: no rashes, lesions, ulcers. No induration Neurologic: Sensation intact. Strength 5/5 in all 4 while in bed.  Gait not  assessed. Psychiatric: Alert and oriented x 3.  EKG: Not performed.  Assessment/Plan Principal Problem:   Spinal stenosis of lumbar region with neurogenic claudication Active Problems:   T12 compression fracture (HCC)   AKI (acute kidney injury) ([place])   Hyponatremia   Acute urinary retention   Type 2 diabetes mellitus without complication, without long-term current use of insulin (HCC)   Leukocytosis   Microcytic anemia   Hypertension associated with diabetes ([place])   Hyperlipidemia associated with type 2 diabetes mellitus ([place])   Mild cognitive impairment   Osborne Lemay is a [place] speaking 56 y.o. 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  Orthopedics recommending admission to [facility] for further evaluation. *** Assessment and Plan: * Spinal stenosis of lumbar region with neurogenic claudication T12 compression fracture Uncontrolled pain and difficulty walking due to compression fracture  and lumbar spinal stenosis.  ROM and strength intact on exam.  Has new urinary retention (possibly from narcotic meds) but no alarm features or suggestion of cauda equina on imaging. -Orthopedics recommend admission to [facility], they will inform [name] for consultation -Continue gabapentin -Analgesics as needed  AKI (acute kidney injury) (HCC) Acute urinary retention Creatinine 1.42 on admission, likely due to urinary retention.  Retention might be due to narcotic pain meds. -Foley catheter placed in ED -Continue catheter care, monitor UOP -Holding diclofenac, losartan-HCTZ, metformin for now  Hyponatremia Mild, sodium 127.  He was given 1 L normal saline.  Recheck labs in AM.  Type 2 diabetes mellitus  without complication, without long-term current use of insulin (HCC) Hyperglycemic in the ED, CBG improved with orange juice.  Holding metformin and glipizide.  Leukocytosis WBC 14.4 on admission.  No clear sign of infection.  Continue to monitor.  Mild cognitive impairment Continue donepezil.  Hyperlipidemia associated with type 2 diabetes mellitus (HCC) Continue atorvastatin.  Hypertension associated with diabetes (HCC) BP stable.  Continue amlodipine and Toprol-XL.  Holding losartan-HCTZ.  Microcytic anemia Hemoglobin stable at 10.0.  DVT prophylaxis: SCDs Start: 05/26/22 2254 Code Status: Full code Family Communication: None present on admission Disposition Plan: From home, admit to [facility].  Dispo pending clinical progress. Consults called: Orthopedics Severity of Illness: The appropriate patient status for this patient is INPATIENT. Inpatient status is judged to be reasonable and necessary in order to provide the required intensity of service to ensure the patient's safety. The patient's presenting symptoms, physical exam findings, and initial radiographic and laboratory data in the context of their chronic comorbidities is felt to place them at high risk for further clinical deterioration. Furthermore, it is not anticipated that the patient will be medically stable for discharge from the hospital within 2 midnights of admission.   * I certify that at the point of admission it is my clinical judgment that the patient will require inpatient hospital care spanning beyond 2 midnights from the point of admission due to high intensity of service, high risk for further deterioration and high frequency of surveillance required.Darreld Mclean MD [facility]  If 7PM-7AM, please contact night-coverage [email or web address]  05/26/2022, 11:08 PM

## 2022-05-26 NOTE — ED Notes (Signed)
Interpreter had to go home. If there are any needs for an interpreter, call [phone]

## 2022-05-26 NOTE — Assessment & Plan Note (Signed)
Na improved with fluids

## 2022-05-26 NOTE — Assessment & Plan Note (Signed)
BP stable.  Continue amlodipine and Toprol-XL.  Holding losartan-HCTZ.

## 2022-05-27 LAB — CBC
HCT: 26.5 % — ABNORMAL LOW (ref 39.0–52.0)
Hemoglobin: 8.6 g/dL — ABNORMAL LOW (ref 13.0–17.0)
MCH: 23.6 pg — ABNORMAL LOW (ref 26.0–34.0)
MCHC: 32.5 g/dL (ref 30.0–36.0)
MCV: 72.6 fL — ABNORMAL LOW (ref 80.0–100.0)
Platelets: 406 10*3/uL — ABNORMAL HIGH (ref 150–400)
RBC: 3.65 MIL/uL — ABNORMAL LOW (ref 4.22–5.81)
RDW: 15.3 % (ref 11.5–15.5)
WBC: 8.9 10*3/uL (ref 4.0–10.5)
nRBC: 0 % (ref 0.0–0.2)

## 2022-05-27 LAB — BASIC METABOLIC PANEL
Anion gap: 7 (ref 5–15)
BUN: 12 mg/dL (ref 6–20)
CO2: 28 mmol/L (ref 22–32)
Calcium: 8.6 mg/dL — ABNORMAL LOW (ref 8.9–10.3)
Chloride: 97 mmol/L — ABNORMAL LOW (ref 98–111)
Creatinine, Ser: 1.26 mg/dL — ABNORMAL HIGH (ref 0.61–1.24)
GFR, Estimated: >60 mL/min (ref >60)
Glucose, Bld: 72 mg/dL (ref 70–99)
Potassium: 3.8 mmol/L (ref 3.5–5.1)
Sodium: 132 mmol/L — ABNORMAL LOW (ref 135–145)

## 2022-05-27 LAB — VITAMIN B12: Vitamin B-12: 911 pg/mL (ref 180–914)

## 2022-05-27 LAB — FERRITIN: Ferritin: 103 ng/mL (ref 24–336)

## 2022-05-27 LAB — FOLATE: Folate: 16.2 ng/mL (ref >5.9)

## 2022-05-27 LAB — IRON AND TIBC
Iron: 27 ug/dL — ABNORMAL LOW (ref 45–182)
Saturation Ratios: 13 % — ABNORMAL LOW (ref 17.9–39.5)
TIBC: 204 ug/dL — ABNORMAL LOW (ref 250–450)
UIBC: 177 ug/dL

## 2022-05-27 LAB — HIV ANTIBODY (ROUTINE TESTING W REFLEX): HIV Screen 4th Generation wRfx: NONREACTIVE

## 2022-05-27 MED ORDER — OXYCODONE HCL 5 MG PO TABS
5.0000 mg | ORAL_TABLET | Freq: Once | ORAL | Status: AC
  Administered 2022-05-27: 5 mg via ORAL
  Filled 2022-05-27: qty 1

## 2022-05-27 MED ORDER — OXYCODONE HCL 5 MG PO TABS
2.5000 mg | ORAL_TABLET | Freq: Once | ORAL | Status: AC
  Administered 2022-05-27: 2.5 mg via ORAL
  Filled 2022-05-27: qty 1

## 2022-05-27 MED ORDER — FERROUS SULFATE 325 (65 FE) MG PO TABS
325.0000 mg | ORAL_TABLET | Freq: Every day | ORAL | Status: DC
  Administered 2022-05-28 – 2022-07-19 (×46): 325 mg via ORAL
  Filled 2022-05-27 (×49): qty 1

## 2022-05-27 MED ORDER — SODIUM CHLORIDE 0.9 % IV SOLN: INTRAVENOUS | Status: AC

## 2022-05-27 MED ORDER — AMLODIPINE BESYLATE 5 MG PO TABS
5.0000 mg | ORAL_TABLET | Freq: Every day | ORAL | Status: DC
  Administered 2022-05-27 – 2022-06-04 (×9): 5 mg via ORAL
  Filled 2022-05-27 (×9): qty 1

## 2022-05-27 MED ORDER — DONEPEZIL HCL 5 MG PO TABS
5.0000 mg | ORAL_TABLET | Freq: Every day | ORAL | Status: DC
  Administered 2022-05-27 – 2022-07-19 (×50): 5 mg via ORAL
  Filled 2022-05-27 (×56): qty 1

## 2022-05-27 MED ORDER — OXYCODONE HCL 5 MG PO TABS
7.5000 mg | ORAL_TABLET | ORAL | Status: DC | PRN
  Administered 2022-05-27 – 2022-05-30 (×10): 7.5 mg via ORAL
  Filled 2022-05-27 (×10): qty 2

## 2022-05-27 MED ORDER — ACETAMINOPHEN 500 MG PO TABS
1000.0000 mg | ORAL_TABLET | Freq: Three times a day (TID) | ORAL | Status: DC
  Administered 2022-05-27 – 2022-06-01 (×17): 1000 mg via ORAL
  Filled 2022-05-27 (×17): qty 2

## 2022-05-27 MED ORDER — METHOCARBAMOL 1000 MG/10ML IJ SOLN
500.0000 mg | Freq: Once | INTRAVENOUS | Status: AC
  Administered 2022-05-27: 500 mg via INTRAVENOUS
  Filled 2022-05-27: qty 500

## 2022-05-27 MED ORDER — HYDROMORPHONE HCL 1 MG/ML IJ SOLN
1.0000 mg | Freq: Once | INTRAMUSCULAR | Status: AC
  Administered 2022-05-27: 1 mg via INTRAVENOUS

## 2022-05-27 MED ORDER — GABAPENTIN 300 MG PO CAPS
300.0000 mg | ORAL_CAPSULE | Freq: Three times a day (TID) | ORAL | Status: DC
  Administered 2022-05-27 – 2022-05-30 (×11): 300 mg via ORAL
  Filled 2022-05-27 (×11): qty 1

## 2022-05-27 MED ORDER — ATORVASTATIN CALCIUM 40 MG PO TABS
40.0000 mg | ORAL_TABLET | Freq: Every day | ORAL | Status: DC
  Administered 2022-05-27 – 2022-07-20 (×48): 40 mg via ORAL
  Filled 2022-05-27 (×53): qty 1

## 2022-05-27 NOTE — ED Notes (Signed)
[facility] called for transport. 

## 2022-05-27 NOTE — ED Notes (Signed)
Used interpreter, Emogene Morgan, to provide patient with update. He reports he is still in pain 10/10 after dilaudid administration. Patient aware will page provider to let them know that pain is uncontrolled at this time. Patient made aware plan to transfer to [facility]. Repositioned patient and assisted with sitting to eat breakfast. Patient reports no other questions or needs at this time.

## 2022-05-27 NOTE — ED Notes (Signed)
Dilaudid administered for transport. Patient transferred at this time.

## 2022-05-27 NOTE — Progress Notes (Signed)
PT Cancellation Note  Patient Details Name: Wayne Stokes MRN: [id number] DOB: 1965/11/09   Cancelled Treatment:    Reason Eval/Treat Not Completed: Other (comment)  Per RN, brace (TLSO) has not yet been delivered.    [place]  Office [phone]   Rexanne Mano 05/27/2022, 2:34 PM

## 2022-05-27 NOTE — ED Notes (Signed)
Dr. Ghimire at bedside 

## 2022-05-27 NOTE — Progress Notes (Addendum)
  Progress Note   Patient: Wayne Stokes NID:[id number] DOB: January 02, 1966 DOA: 05/26/2022     1 DOS: the patient was seen and examined on 05/27/2022 at 11:40 AM      Brief hospital course: Wayne Stokes is 56 y.o. M with HTN, DM, lumbar DDD and spinal stenosis T12 vertebral compression fracture, and mild cognitive impairment on donepezil who was with his case worker at the [place] office to be evaluated for disability but had so much pain and appeared to be unable to walk, so DSS called 9-1-1.  In the ER, MRI lumbar spine showed known T12 compression fracture and lumbar spinal stenosis.    Orthopedics recommending admission to [facility] for further evaluation.     Assessment and Plan: * Spinal stenosis of lumbar region with neurogenic claudication T12 compression fracture Reviewed with neurosurgery and orthopedics Per orthopedics note that they are unable to provide services on thoracic spine, and there are 2 spine surgeons Dr. Lorin Mercy and Dr. Remigio Eisenmenger is retiring, recommend follow-up with neurosurgery  Neurosurgery consulted, they recommend no surgical intervention at this time, analgesics and outpatient follow-up. - TLSO for comfort - Robaxin - Oxycodone and acetaminophen - Calcitonin nasal spray for compression fracture - Outpatient follow-up with neurosurgery          AKI (acute kidney injury) ([place]) Acute urinary retention Mild AKI, Cr improved to 1.2 today. - Foley catheter placed in ED - Avoid diclofenac, losartan-HCTZ, metformin       Hyponatremia Na improved with fluids  Type 2 diabetes mellitus without complication, without long-term current use of insulin (HCC) - Hold metformin and glipizide - SS correction insulin    Hypertension associated with diabetes (HCC) BP controlled - Continue amlodipine - Hold Toprol, losartan, HCTZ  Mild cognitive impairment - Continue donepezil.  Hyperlipidemia associated with type 2 diabetes mellitus (HCC) - Continue  atorvastatin.  Iron deficiency anemia Hgb slightly down, no clinical bleeding, likely dilutional. - Start iron - Outpatient GI follow up          Subjective: All history collected through [place] interpreter by phone. Patient has pain in his buttocks, extending down into the legs, he also has numbness in the buttocks and legs.  He has no point tenderness over the thoracic spine, and no dermatomal numbness in the thoracic distribution.  No fever, confusion.     Physical Exam: BP 107/77 (BP Location: Right Arm)   Pulse 83   Temp 99 F (37.2 C) (Oral)   Resp 16   SpO2 93%   Adult male, lying in bed, appears uncomfortable RRR, no murmurs, no peripheral edema Respiratory rate normal, lungs clear without rales or wheezes Abdomen soft tenderness palpation or guarding, no ascites or distention Strength 5 -/5 bilateral lower extremities, he has no point tenderness along the spine.  Attention diminished by pain, affect blunted, judgment insight appear relatively close to normal, speech fluent through interpreter    Data Reviewed: Discussed with neurosurgery Patient metabolic panel and CBC reviewed and summarized above      Disposition: Status is: Inpatient The patient presented with difficulty ambulating due to severe pain.        Author: Edwin Dada, MD 05/27/2022 5:00 PM  For on call review [email or web address].

## 2022-05-27 NOTE — ED Notes (Signed)
Provider alerted patient c/o pain 10/10. Requesting additional medication for transport.

## 2022-05-27 NOTE — Progress Notes (Signed)
Orthopedic Tech Progress Note Patient Details:  Wayne Stokes [birth year] [id number]  Went to service patient with back brace, patient indicated he wanted to sit up so he could eat dinner, (patient is in middle of bed laying down) I called for help, slid patient up in bed and brought his head up to 30 degrees so he could eat, LPN puts it back down saying " patient is hurting" but clearly patient was ok. Left and patient asked to come back up  Ortho Devices Type of Ortho Device: Thoracolumbar corset (TLSO) Ortho Device/Splint Location: BACK Ortho Device/Splint Interventions: Ordered, Adjustment   Post Interventions Patient Tolerated: Well, Fair Instructions Provided: Care of device  Janit Pagan 05/27/2022, 5:56 PM

## 2022-05-27 NOTE — Care Management (Signed)
Patient followed by [facility] Nurse  [name] RN, [phone] Assist with medication management, doctor appointments [place] PCP  Teena Dunk, NP

## 2022-05-27 NOTE — Progress Notes (Signed)
Patient ID: Wayne Stokes, male   DOB: 03/02/1966, 56 y.o.   MRN: [id number]  56 y.o. male with c/o severe and progressively worsening low back pain that radiates into his left buttock and down his LLE stopping around his mid-thigh. Over the last week, his pain has made ambulation difficult. Neurological exam appears to be intact and at his baseline. CT head, cervical, thoracic, and lumbar reviewed as well as lumbar MRI. His imaging is most notable for an acute T12 compression fracture with approximately 25% height loss with trace retrolisthesis. No significant spinal stenosis associated with the compression fracture. His lumbar imaging reveled degenerative and arthritic changes that was most notable for the L4/5 level where there is multifactorial severe spinal stenosis and severe bilateral foraminal stenosis. He is followed by orthopedics, Dr. Lorin Mercy.  Last evaluated on 05/13/2022.  There is plan for evaluation by Dr. Laurance Flatten for possible L4-5 fusion with decompression once compression fracture is healed.   The patient does not need acute surgical intervention. I question whether some of his current symptoms are related to the lower lumbar region and not the compression fracture as I would expect his back pain to be closer to the upper lumbar region and not related with any type of radicular pain. I would recommend activity modifications, TLSO brace PRN, pain control, and PT/OT evaluation. Could consider a kyphoplasty at T12 if the patient's symptoms fail to improve with conservative management. He can follow up as an outpatient with his orhto team or neurosurgery.       Wayne Moeller, DNP, AGNP-C Neurosurgery Nurse Practitioner  [facility] [place] [address], [place], [place] [address] P: [phone]    F: [phone]  05/27/2022 12:19 PM

## 2022-05-28 DIAGNOSIS — E1159 Type 2 diabetes mellitus with other circulatory complications: Secondary | ICD-10-CM

## 2022-05-28 DIAGNOSIS — E1169 Type 2 diabetes mellitus with other specified complication: Secondary | ICD-10-CM

## 2022-05-28 DIAGNOSIS — N179 Acute kidney failure, unspecified: Secondary | ICD-10-CM

## 2022-05-28 DIAGNOSIS — R338 Other retention of urine: Secondary | ICD-10-CM

## 2022-05-28 DIAGNOSIS — I152 Hypertension secondary to endocrine disorders: Secondary | ICD-10-CM

## 2022-05-28 DIAGNOSIS — G3184 Mild cognitive impairment, so stated: Secondary | ICD-10-CM

## 2022-05-28 DIAGNOSIS — E871 Hypo-osmolality and hyponatremia: Secondary | ICD-10-CM

## 2022-05-28 DIAGNOSIS — E785 Hyperlipidemia, unspecified: Secondary | ICD-10-CM

## 2022-05-28 DIAGNOSIS — S22080A Wedge compression fracture of T11-T12 vertebra, initial encounter for closed fracture: Secondary | ICD-10-CM

## 2022-05-28 LAB — COMPREHENSIVE METABOLIC PANEL
ALT: 10 U/L (ref 0–44)
AST: 21 U/L (ref 15–41)
Albumin: 2.1 g/dL — ABNORMAL LOW (ref 3.5–5.0)
Alkaline Phosphatase: 67 U/L (ref 38–126)
Anion gap: 9 (ref 5–15)
BUN: 15 mg/dL (ref 6–20)
CO2: 27 mmol/L (ref 22–32)
Calcium: 8.5 mg/dL — ABNORMAL LOW (ref 8.9–10.3)
Chloride: 94 mmol/L — ABNORMAL LOW (ref 98–111)
Creatinine, Ser: 1.15 mg/dL (ref 0.61–1.24)
GFR, Estimated: >60 mL/min (ref >60)
Glucose, Bld: 195 mg/dL — ABNORMAL HIGH (ref 70–99)
Potassium: 3.5 mmol/L (ref 3.5–5.1)
Sodium: 130 mmol/L — ABNORMAL LOW (ref 135–145)
Total Bilirubin: 0.5 mg/dL (ref 0.3–1.2)
Total Protein: 5.7 g/dL — ABNORMAL LOW (ref 6.5–8.1)

## 2022-05-28 LAB — CBC WITH DIFFERENTIAL/PLATELET
Abs Immature Granulocytes: 0.16 10*3/uL — ABNORMAL HIGH (ref 0.00–0.07)
Basophils Absolute: 0.1 10*3/uL (ref 0.0–0.1)
Basophils Relative: 1 %
Eosinophils Absolute: 0.2 10*3/uL (ref 0.0–0.5)
Eosinophils Relative: 2 %
HCT: 31.3 % — ABNORMAL LOW (ref 39.0–52.0)
Hemoglobin: 10.2 g/dL — ABNORMAL LOW (ref 13.0–17.0)
Immature Granulocytes: 2 %
Lymphocytes Relative: 11 %
Lymphs Abs: 0.9 10*3/uL (ref 0.7–4.0)
MCH: 23.7 pg — ABNORMAL LOW (ref 26.0–34.0)
MCHC: 32.6 g/dL (ref 30.0–36.0)
MCV: 72.6 fL — ABNORMAL LOW (ref 80.0–100.0)
Monocytes Absolute: 0.9 10*3/uL (ref 0.1–1.0)
Monocytes Relative: 11 %
Neutro Abs: 6 10*3/uL (ref 1.7–7.7)
Neutrophils Relative %: 73 %
Platelets: 410 10*3/uL — ABNORMAL HIGH (ref 150–400)
RBC: 4.31 MIL/uL (ref 4.22–5.81)
RDW: 15.2 % (ref 11.5–15.5)
WBC: 8.2 10*3/uL (ref 4.0–10.5)
nRBC: 0 % (ref 0.0–0.2)

## 2022-05-28 LAB — GLUCOSE, CAPILLARY
Glucose-Capillary: 184 mg/dL — ABNORMAL HIGH (ref 70–99)
Glucose-Capillary: 137 mg/dL — ABNORMAL HIGH (ref 70–99)

## 2022-05-28 LAB — MAGNESIUM: Magnesium: 1.4 mg/dL — ABNORMAL LOW (ref 1.7–2.4)

## 2022-05-28 LAB — PHOSPHORUS: Phosphorus: 3 mg/dL (ref 2.5–4.6)

## 2022-05-28 MED ORDER — SODIUM CHLORIDE 0.9 % IV SOLN: INTRAVENOUS | Status: AC

## 2022-05-28 MED ORDER — POTASSIUM CHLORIDE CRYS ER 20 MEQ PO TBCR
40.0000 meq | EXTENDED_RELEASE_TABLET | Freq: Two times a day (BID) | ORAL | Status: AC
  Administered 2022-05-28 (×2): 40 meq via ORAL
  Filled 2022-05-28 (×3): qty 2

## 2022-05-28 MED ORDER — INSULIN ASPART 100 UNIT/ML IJ SOLN
0.0000 [IU] | Freq: Three times a day (TID) | INTRAMUSCULAR | Status: DC
  Administered 2022-05-29 – 2022-05-30 (×4): 2 [IU] via SUBCUTANEOUS
  Administered 2022-05-31: 3 [IU] via SUBCUTANEOUS
  Administered 2022-05-31 – 2022-06-01 (×2): 1 [IU] via SUBCUTANEOUS
  Administered 2022-06-01: 2 [IU] via SUBCUTANEOUS
  Administered 2022-06-02: 1 [IU] via SUBCUTANEOUS
  Administered 2022-06-03: 2 [IU] via SUBCUTANEOUS
  Administered 2022-06-03: 1 [IU] via SUBCUTANEOUS
  Administered 2022-06-04: 2 [IU] via SUBCUTANEOUS
  Administered 2022-06-04: 1 [IU] via SUBCUTANEOUS
  Administered 2022-06-05: 2 [IU] via SUBCUTANEOUS
  Administered 2022-06-09 (×2): 1 [IU] via SUBCUTANEOUS
  Administered 2022-06-10: 2 [IU] via SUBCUTANEOUS
  Administered 2022-06-10: 1 [IU] via SUBCUTANEOUS
  Administered 2022-06-11: 2 [IU] via SUBCUTANEOUS
  Administered 2022-06-11 (×2): 1 [IU] via SUBCUTANEOUS
  Administered 2022-06-12 (×2): 2 [IU] via SUBCUTANEOUS
  Administered 2022-06-13: 1 [IU] via SUBCUTANEOUS
  Administered 2022-06-13: 2 [IU] via SUBCUTANEOUS
  Administered 2022-06-14 – 2022-06-16 (×2): 1 [IU] via SUBCUTANEOUS
  Administered 2022-06-17: 2 [IU] via SUBCUTANEOUS
  Administered 2022-06-18: 1 [IU] via SUBCUTANEOUS
  Administered 2022-06-19: 2 [IU] via SUBCUTANEOUS
  Administered 2022-06-20 (×2): 1 [IU] via SUBCUTANEOUS
  Administered 2022-06-21 – 2022-06-22 (×2): 2 [IU] via SUBCUTANEOUS

## 2022-05-28 MED ORDER — MAGNESIUM SULFATE 4 GM/100ML IV SOLN
4.0000 g | Freq: Once | INTRAVENOUS | Status: AC
  Administered 2022-05-28: 4 g via INTRAVENOUS
  Filled 2022-05-28: qty 100

## 2022-05-28 MED ORDER — CHLORHEXIDINE GLUCONATE CLOTH 2 % EX PADS
6.0000 | MEDICATED_PAD | Freq: Every day | CUTANEOUS | Status: DC
  Administered 2022-05-28 – 2022-08-01 (×60): 6 via TOPICAL

## 2022-05-28 NOTE — Evaluation (Signed)
Physical Therapy Evaluation Patient Details Name: Wayne Stokes MRN: [id number] DOB: 1966-05-20 Today's Date: 05/28/2022  History of Present Illness  56 y.o. male presented 05/26/22 with c/o severe and progressively worsening low back pain that radiates into his left buttock and down his LLE. Imaging +acute T12 compression fracture and  L4/5 there is multifactorial severe spinal stenosis and severe bilateral foraminal stenosis. Neurosurgery consult with no acute surgical intervention needed. 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 Pt to benefit from acute PT to address deficits. Pt ambulated room distance with min-mod physical assist throughout, anticipate improvement in assist level with pain control. PT to progress mobility as tolerated, and will continue to follow acutely.         Recommendations for follow up therapy are one component of a multi-disciplinary discharge planning process, led by the attending physician.  Recommendations may be updated based on patient status, additional functional criteria and insurance authorization.  Follow Up Recommendations Home health PT      Assistance Recommended at Discharge Intermittent Supervision/Assistance  Patient can return home with the following  A little help with walking and/or transfers;A little help with bathing/dressing/bathroom    Equipment Recommendations BSC/3in1  Recommendations for Other Services       Functional Status Assessment Patient has had a recent decline in their functional status and demonstrates the ability to make significant improvements in function in a reasonable and predictable amount of time.     Precautions / Restrictions  Precautions Precautions: Fall;Back Precaution Comments: reviewed BLT back rule for pt comfort Required Braces or Orthoses: Spinal Brace Spinal Brace: Thoracolumbosacral orthotic;Applied in sitting position (only when OOB) Restrictions Weight Bearing Restrictions: No      Mobility  Bed Mobility Overal bed mobility: Needs Assistance Bed Mobility: Rolling, Sidelying to Sit, Sit to Sidelying Rolling: Min assist Sidelying to sit: Mod assist     Sit to sidelying: Min assist General bed mobility comments: min-mod assist for trunk and LE management, roll to/from EOB. Increased time, sequencing cues    Transfers Overall transfer level: Needs assistance Equipment used: Rolling walker (2 wheels) Transfers: Sit to/from Stand Sit to Stand: Mod assist, From elevated surface           General transfer comment: assist for rise and steady, cues for hand placement    Ambulation/Gait Ambulation/Gait assistance: Min assist Gait Distance (Feet): 20 Feet Assistive device: Rolling walker (2 wheels) Gait Pattern/deviations: Step-through pattern, Decreased stride length, Trunk flexed Gait velocity: decr     General Gait Details: assist to steady, cues for upright posture and placement in RW  Stairs            Wheelchair Mobility    Modified Rankin (Stroke Patients Only)       Balance Overall balance assessment: Needs assistance, History of Falls Sitting-balance support: Feet supported, No upper extremity supported Sitting balance-Leahy Scale: Fair     Standing balance support: Bilateral upper extremity supported, During functional activity, Reliant on assistive device for balance Standing balance-Leahy Scale: Poor                               Pertinent Vitals/Pain Pain Assessment Pain Assessment: 0-10 Pain Score: 3  Pain Location: back Pain  Descriptors / Indicators: Sore Pain Intervention(s): Limited activity within patient's tolerance, Monitored during  session, Repositioned, Premedicated before session    Home Living Family/patient expects to be discharged to:: Private residence Living Arrangements: Non-relatives/Friends Available Help at Discharge: Friend(s);Available PRN/intermittently Type of Home: Apartment Home Access: Level entry       Home Layout: One level Home Equipment: Conservation officer, nature (2 wheels)      Prior Function Prior Level of Function : Independent/Modified Independent;History of Falls (last six months)             Mobility Comments: uses RW >1 year       Hand Dominance   Dominant Hand: Right    Extremity/Trunk Assessment   Upper Extremity Assessment Upper Extremity Assessment: Defer to OT evaluation    Lower Extremity Assessment Lower Extremity Assessment: Generalized weakness;RLE deficits/detail;LLE deficits/detail RLE: Unable to fully assess due to pain LLE: Unable to fully assess due to pain    Cervical / Trunk Assessment Cervical / Trunk Assessment: Other exceptions Cervical / Trunk Exceptions: back pain, guarding  Communication   Communication: Prefers language other than [place];Interpreter utilized Ronny Flurry, in-person interpreter)  Cognition Arousal/Alertness: Awake/alert Behavior During Therapy: WFL for tasks assessed/performed Overall Cognitive Status: Within Functional Limits for tasks assessed                                 General Comments: documented history of MCI        General Comments      Exercises     Assessment/Plan    PT Assessment Patient needs continued PT services  PT Problem List Decreased strength;Decreased mobility;Decreased activity tolerance;Decreased balance;Decreased knowledge of use of DME;Pain;Decreased safety awareness       PT Treatment Interventions DME instruction;Therapeutic activities;Gait training;Therapeutic exercise;Patient/family education;Balance training;Stair training;Neuromuscular re-education;Functional mobility training     PT Goals (Current goals can be found in the Care Plan section)  Acute Rehab PT Goals Patient Stated Goal: less back pain PT Goal Formulation: With patient/family Time For Goal Achievement: 06/11/22 Potential to Achieve Goals: Good    Frequency Min 4X/week     Co-evaluation               AM-PAC PT "6 Clicks" Mobility  Outcome Measure Help needed turning from your back to your side while in a flat bed without using bedrails?: A Little Help needed moving from lying on your back to sitting on the side of a flat bed without using bedrails?: A Little Help needed moving to and from a bed to a chair (including a wheelchair)?: A Little Help needed standing up from a chair using your arms (e.g., wheelchair or bedside chair)?: A Little Help needed to walk in hospital room?: A Little Help needed climbing 3-5 steps with a railing? : A Little 6 Click Score: 18    End of Session Equipment Utilized During Treatment: Back brace Activity Tolerance: Patient limited by pain;Patient limited by fatigue Patient left: in bed;with call bell/phone within reach;with bed alarm set Nurse Communication: Mobility status PT Visit Diagnosis: Other abnormalities of gait and mobility (R26.89);Muscle weakness (generalized) (M62.81)    Time: 6948-5462 PT Time Calculation (min) (ACUTE ONLY): 20 min   Charges:   PT Evaluation $PT Eval Low Complexity: 1 Low          Floreen Teegarden S, PT DPT Acute Rehabilitation Services Pager [phone]  Office [phone]   [place] E Ruffin Pyo 05/28/2022, 1:35 PM

## 2022-05-28 NOTE — Progress Notes (Signed)
PROGRESS NOTE    Wayne Stokes  [id number] DOB: 24-Sep-1965 DOA: 05/26/2022 PCP: [name], NP   Brief Narrative:  Wayne Stokes is a [name] speaking 56 y.o. 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  Orthopedics recommending admission to [facility] for further evaluation.  Neurosurgery evaluated and he complained of severe and worsening back pain that radiated into his left buttocks down his left leg stopping on his mid thigh.  Over the last week the pain had made ambulation difficult and he remained neurologically intact.  Neurosurgery evaluated and felt that he does not need any acute surgical intervention and they feel that some of his current symptoms are related to the lower lumbar region and not a compression fracture as they would expect the back to be close to the upper lumbar region.  They are recommending continuing activity modifications and TSO brace as needed as well as pain control.  PT OT recommending home health.  If patient's symptoms fail to improve with conservative management kyphoplasty could be considered.  Assessment and Plan: * Spinal stenosis of lumbar region with neurogenic claudication T12 compression fracture -Reviewed with neurosurgery and orthopedics -Per orthopedics note that they are unable to provide services on thoracic spine, and there are 2 spine surgeons Dr. Lorin Mercy and Dr. Remigio Eisenmenger is retiring, recommend follow-up with neurosurgery -Neurosurgery consulted and recommending no acute surgical intervention - TLSO for comfort - Robaxin - Oxycodone and acetaminophen - Calcitonin nasal spray for compression fracture - Outpatient follow-up with neurosurgery and if he feels improved  with conservative management they are recommending considering kyphoplasty -PT OT recommending home health  T12 compression fracture ([place]) -MRI done and showed "Acute compression fracture involving the T12 vertebral body with up to 30% height loss and trace 2-3 mm bony retropulsion. No significant spinal stenosis.  Advanced degenerative disc disease and facet arthrosis at L4-5 with resultant severe spinal stenosis, with severe bilateral L4 foraminal narrowing. Reactive marrow edema about this level favored to be degenerative in nature, although possible changes of infection/osteomyelitis discitis difficult to exclude, and could be considered in the correct clinical setting.  Correlation with physical exam and laboratory values recommended" -May consider kyphoplasty if not improving  AKI (acute kidney injury) ([place]) Acute urinary retention -Mild AKI, Cr improved to 1.2 yesterday and now BUNs/creatinine is 15/1.15. - Foley catheter placed in ED - Avoid diclofenac, losartan-HCTZ, metformin -We will need a trial of void -We will need to avoid further nephrotoxic medications, contrast dyes, hypotension and dehydration to ensure adequate renal perfusion and will need to renally dose medications -Repeat CMP in a.m. and try a trial of void in the a.m.  Acute urinary retention  -Foley catheter is in place and will need a trial of void in the morning  Hyponatremia -Sodium went from 135 and trended down to 127 and improved with IV fluid rate to 132 but is now 130 -Continue with IV fluids with normal saline at 75 MLS per hour for 10 more hours -Continue to Monitor and Trend and repeat CMP in the AM   Type 2 diabetes mellitus without complication, without long-term current use of insulin (HCC) - Hold metformin and glipizide - Continue with sensitive NovoLog/scale insulin AC  Leukocytosis -Due to pain and improved and trended down  to 8.2 -Continue to monitor and trend and repeat CBC in  a.m.  Hypertension associated with diabetes ([place]) -BP controlled - Continue amlodipine - Hold Toprol, losartan, HCTZ -Continue monitor blood pressures per protocol  Mild cognitive impairment -Continue Donepezil.  Hyperlipidemia associated with type 2 diabetes mellitus (HCC) - Continue Atorvastatin 40 mg po Daily   Hypomagnesemia -Patient's magnesium level is 1.4 -Replete with IV mag sulfate 4 g -Continue monitor and trend and replete as necessary  Hypoalbuminemia -Patient's albumin level went from 2.7 is now 2.1 -Continue to monitor trend and repeat CMP in a.m.  Iron deficiency anemia and Microcytic Anemia -Hgb stable and will continue with ferrous sulfate -Anemia panel done and showed an iron level of 27, U IBC 177, TIBC of 204, saturation ratios of 13%, ferritin level 103, folate level 16.2 and vitamin B12 911 -Patient's hemoglobin/hematocrit went from 10.0/30.8 -> 8.6/26.5 -> 10.2/31.3 -Continue to Monitor for S/Sx of Bleeding; No overt bleeding -Repeat CBC in the AM   Thrombocytosis -Mild and Likely reactive in the setting of Pain -Patient's Platelet Count went from 266 -> 548 -> 406 -> 410  DVT prophylaxis: SCDs Start: 05/26/22 2254    Code Status: Full Code Family Communication: No family currently at bedside  Disposition Plan:  Level of care: Med-Surg Status is: Inpatient Remains inpatient appropriate because: Will need PT OT to further evaluate and treat and anticipating discharge in the next 24 to 48 hours if stable   Consultants:  Orthopedic surgery Neurosurgery  Procedures:  As above  Antimicrobials:  Anti-infectives (From admission, onward)    None        Subjective: Seen and examined at bedside with the translator [facility] and the patient was continued to have significant back pain.  No nausea or vomiting.  Has some mild abdominal discomfort.  Denies any chest pain.  States that his back pain is bothering him the most.  No other concerns  reports this time.  Objective: Vitals:   05/27/22 2348 05/28/22 0227 05/28/22 0500 05/28/22 0810  BP: (!) 112/92 117/72  134/86  Pulse: 78 65  89  Resp: 15 15  18   Temp: 97.8 F (36.6 C) 98 F (36.7 C)  97.6 F (36.4 C)  TempSrc: Oral   Oral  SpO2: 92% 95%  96%  Weight:   61.7 kg     Intake/Output Summary (Last 24 hours) at 05/28/2022 1309 Last data filed at 05/28/2022 1302 Gross per 24 hour  Intake 530 ml  Output 3200 ml  Net -2670 ml   Filed Weights   05/28/22 0500  Weight: 61.7 kg   Examination: Physical Exam:  Constitutional: WN/WD overweight male in mild distress appears slightly uncomfortable Respiratory: Diminished to auscultation bilaterally, no wheezing, rales, rhonchi or crackles. Normal respiratory effort and patient is not tachypenic. No accessory muscle use.  Unlabored breathing Cardiovascular: RRR, no murmurs / rubs / gallops. S1 and S2 auscultated. No extremity edema.  Abdomen: Soft, non-tender, distended secondary body habitus. Bowel sounds positive.  GU: Deferred.  Foley catheter is in place Musculoskeletal: No clubbing / cyanosis of digits/nails. No joint deformity upper and lower extremities.  Skin: No rashes, lesions, ulcers. No induration; Warm and dry.  Neurologic: CN 2-12 grossly intact with no focal deficits. Romberg sign and cerebellar reflexes not assessed.  Psychiatric: Normal judgment and insight. Alert and oriented x 3. Normal mood and appropriate affect.   Data Reviewed: I have personally reviewed following labs and imaging studies  CBC: Recent Labs  Lab 05/26/22 1320 05/27/22 0438 05/28/22 0939  WBC 14.4* 8.9 8.2  NEUTROABS 10.0*  --  6.0  HGB 10.0* 8.6* 10.2*  HCT 30.8* 26.5* 31.3*  MCV 72.6* 72.6* 72.6*  PLT 548* 406* 123XX123*   Basic Metabolic Panel: Recent Labs  Lab 05/26/22 1320 05/27/22 0438 05/28/22 0939  NA 127* 132* 130*  K 4.0 3.8 3.5  CL 89* 97* 94*  CO2 23 28 27   GLUCOSE 60* 72 195*  BUN 13 12 15   CREATININE  1.42* 1.26* 1.15  CALCIUM 8.7* 8.6* 8.5*  MG  --   --  1.4*  PHOS  --   --  3.0   GFR: Estimated Creatinine Clearance: 54.8 mL/min (by C-G formula based on SCr of 1.15 mg/dL). Liver Function Tests: Recent Labs  Lab 05/26/22 1320 05/28/22 0939  AST 21 21  ALT 11 10  ALKPHOS 83 67  BILITOT 0.4 0.5  PROT 6.9 5.7*  ALBUMIN 2.7* 2.1*   No results for input(s): "LIPASE", "AMYLASE" in the last 168 hours. No results for input(s): "AMMONIA" in the last 168 hours. Coagulation Profile: No results for input(s): "INR", "PROTIME" in the last 168 hours. Cardiac Enzymes: No results for input(s): "CKTOTAL", "CKMB", "CKMBINDEX", "TROPONINI" in the last 168 hours. BNP (last 3 results) No results for input(s): "PROBNP" in the last 8760 hours. HbA1C: No results for input(s): "HGBA1C" in the last 72 hours. CBG: Recent Labs  Lab 05/26/22 1446  GLUCAP 91   Lipid Profile: No results for input(s): "CHOL", "HDL", "LDLCALC", "TRIG", "CHOLHDL", "LDLDIRECT" in the last 72 hours. Thyroid Function Tests: No results for input(s): "TSH", "T4TOTAL", "FREET4", "T3FREE", "THYROIDAB" in the last 72 hours. Anemia Panel: Recent Labs    05/27/22 0438  VITAMINB12 911  FOLATE 16.2  FERRITIN 103  TIBC 204*  IRON 27*   Sepsis Labs: No results for input(s): "PROCALCITON", "LATICACIDVEN" in the last 168 hours.  No results found for this or any previous visit (from the past 240 hour(s)).   Radiology Studies: MR LUMBAR SPINE WO CONTRAST  Result Date: 05/26/2022 CLINICAL DATA:  Initial evaluation for acute low back pain. EXAM: MRI LUMBAR SPINE WITHOUT CONTRAST TECHNIQUE: Multiplanar, multisequence MR imaging of the lumbar spine was performed. No intravenous contrast was administered. COMPARISON:  Prior CT from 05/07/2022. FINDINGS: Segmentation:  Examination moderately degraded by motion artifact. Standard segmentation. Lowest well-formed disc space labeled the L5-S1 level. Alignment: Dextroscoliosis.  Underlying straightening and mild reversal of the normal lumbar lordosis. Trace anterolisthesis of L4 on L5, likely degenerative. Vertebrae: Acute compression fracture involving the T12 vertebral body with up to 30% height loss and trace 2-3 mm bony retropulsion. Mild vertebral body height loss about the L4-5 interbody space, likely degenerative. Otherwise, vertebral body height maintained with no other acute or chronic fracture. Underlying bone marrow signal intensity within normal limits. No worrisome osseous lesions. Reactive marrow edema about the L4-5 interspace favored to be degenerative in nature. Possible changes of superimposed infection difficult to exclude, and could be considered in the correct clinical setting. Conus medullaris and cauda equina: Conus extends to approximately the L1 level. Conus medullaris grossly within normal limits on this motion degraded exam. Nerve roots of the cauda equina grossly within normal limits as well, although evaluation limited by motion. Paraspinal and other soft tissues: Mild paraspinous edema adjacent to the L4-5 interspace, favored to be reactive. Paraspinous soft tissues demonstrate no other acute finding. Disc levels: A degree of underlying congenital spinal stenosis noted. T12-L1: Trace 2-3 mm bony retropulsion  related to the T12 compression fracture. Underlying mild disc bulge, eccentric to the left. Associated annular fissure. Mild facet hypertrophy. No significant spinal stenosis. Mild left foraminal narrowing. Right neural foramen remains patent. L1-2: Disc desiccation with mild disc bulge and reactive endplate spurring. No significant spinal stenosis. Foramina remain patent. L2-3: Disc desiccation with mild disc bulge and reactive endplate spurring. Mild facet hypertrophy. Epidural lipomatosis. Underlying short pedicles with resultant mild-to-moderate spinal stenosis. Mild right L2 foraminal narrowing. Left neural foramen remains patent. L3-4: Disc  desiccation with mild disc bulge and reactive endplate spurring. Mild facet hypertrophy. Epidural lipomatosis. Changes superimposed on underlying short pedicles resultant mild-to-moderate spinal stenosis. Mild right with mild-to-moderate left L3 foraminal stenosis. L4-5: Advanced degenerative intervertebral disc space narrowing with disc desiccation and diffuse disc bulge. Reactive endplate change with endplate osteophytic spurring and marrow edema. Probable large left extraforaminal/far lateral disc extrusion with inferior migration, better seen on prior CT from 05/07/2022 (series 3, image 103 on that exam). Moderate bilateral facet arthrosis. Changes superimposed on short pedicles resultant severe spinal stenosis. Severe bilateral L4 foraminal narrowing. L5-S1: Negative interspace. Minimal facet spurring with epidural lipomatosis. No significant spinal stenosis. Foramina remain patent. IMPRESSION: 1. Acute compression fracture involving the T12 vertebral body with up to 30% height loss and trace 2-3 mm bony retropulsion. No significant spinal stenosis. 2. Advanced degenerative disc disease and facet arthrosis at L4-5 with resultant severe spinal stenosis, with severe bilateral L4 foraminal narrowing. Reactive marrow edema about this level favored to be degenerative in nature, although possible changes of infection/osteomyelitis discitis difficult to exclude, and could be considered in the correct clinical setting. Correlation with physical exam and laboratory values recommended. Electronically Signed   By: Jeannine Boga M.D.   On: 05/26/2022 19:24    Scheduled Meds:  acetaminophen  1,000 mg Oral TID   amLODipine  5 mg Oral Daily   atorvastatin  40 mg Oral Daily   Chlorhexidine Gluconate Cloth  6 each Topical Daily   donepezil  5 mg Oral QHS   ferrous sulfate  325 mg Oral Q breakfast   gabapentin  300 mg Oral TID   potassium chloride  40 mEq Oral BID   Continuous Infusions:  sodium chloride 75  mL/hr at 05/28/22 1113   magnesium sulfate bolus IVPB 4 g (05/28/22 1114)    LOS: 2 days   Raiford Noble, DO [facility] Available via Epic secure chat 7am-7pm After these hours, please refer to coverage provider listed on [email or web address] 05/28/2022, 1:09 PM

## 2022-05-28 NOTE — Care Management (Addendum)
Requested [facility] to assess for possible charity [facility] PT OT.  Referral pending Referral Accepted. Spoke w patient's congregational nurse and she states that is ok to call her to set up [facility] appointments as patient would likely not answer phone. [name] RN, [phone]  Name and contact info provided to Amy w [facility]

## 2022-05-28 NOTE — Plan of Care (Signed)

## 2022-05-29 DIAGNOSIS — D509 Iron deficiency anemia, unspecified: Secondary | ICD-10-CM

## 2022-05-29 LAB — COMPREHENSIVE METABOLIC PANEL
ALT: 9 U/L (ref 0–44)
AST: 14 U/L — ABNORMAL LOW (ref 15–41)
Albumin: 2 g/dL — ABNORMAL LOW (ref 3.5–5.0)
Alkaline Phosphatase: 65 U/L (ref 38–126)
Anion gap: 8 (ref 5–15)
BUN: 15 mg/dL (ref 6–20)
CO2: 25 mmol/L (ref 22–32)
Calcium: 8.5 mg/dL — ABNORMAL LOW (ref 8.9–10.3)
Chloride: 103 mmol/L (ref 98–111)
Creatinine, Ser: 1.14 mg/dL (ref 0.61–1.24)
GFR, Estimated: >60 mL/min (ref >60)
Glucose, Bld: 107 mg/dL — ABNORMAL HIGH (ref 70–99)
Potassium: 4.5 mmol/L (ref 3.5–5.1)
Sodium: 136 mmol/L (ref 135–145)
Total Bilirubin: 0.4 mg/dL (ref 0.3–1.2)
Total Protein: 5.3 g/dL — ABNORMAL LOW (ref 6.5–8.1)

## 2022-05-29 LAB — GLUCOSE, CAPILLARY
Glucose-Capillary: 164 mg/dL — ABNORMAL HIGH (ref 70–99)
Glucose-Capillary: 191 mg/dL — ABNORMAL HIGH (ref 70–99)
Glucose-Capillary: 119 mg/dL — ABNORMAL HIGH (ref 70–99)
Glucose-Capillary: 179 mg/dL — ABNORMAL HIGH (ref 70–99)

## 2022-05-29 LAB — CBC WITH DIFFERENTIAL/PLATELET
Abs Immature Granulocytes: 0.15 10*3/uL — ABNORMAL HIGH (ref 0.00–0.07)
Basophils Absolute: 0.1 10*3/uL (ref 0.0–0.1)
Basophils Relative: 1 %
Eosinophils Absolute: 0.2 10*3/uL (ref 0.0–0.5)
Eosinophils Relative: 3 %
HCT: 26 % — ABNORMAL LOW (ref 39.0–52.0)
Hemoglobin: 8.4 g/dL — ABNORMAL LOW (ref 13.0–17.0)
Immature Granulocytes: 2 %
Lymphocytes Relative: 12 %
Lymphs Abs: 1 10*3/uL (ref 0.7–4.0)
MCH: 23.5 pg — ABNORMAL LOW (ref 26.0–34.0)
MCHC: 32.3 g/dL (ref 30.0–36.0)
MCV: 72.6 fL — ABNORMAL LOW (ref 80.0–100.0)
Monocytes Absolute: 1 10*3/uL (ref 0.1–1.0)
Monocytes Relative: 13 %
Neutro Abs: 5.6 10*3/uL (ref 1.7–7.7)
Neutrophils Relative %: 69 %
Platelets: 366 10*3/uL (ref 150–400)
RBC: 3.58 MIL/uL — ABNORMAL LOW (ref 4.22–5.81)
RDW: 15.3 % (ref 11.5–15.5)
WBC: 8.1 10*3/uL (ref 4.0–10.5)
nRBC: 0 % (ref 0.0–0.2)

## 2022-05-29 LAB — MAGNESIUM: Magnesium: 2.1 mg/dL (ref 1.7–2.4)

## 2022-05-29 LAB — PHOSPHORUS: Phosphorus: 2.9 mg/dL (ref 2.5–4.6)

## 2022-05-29 MED ORDER — MUSCLE RUB 10-15 % EX CREA
1.0000 | TOPICAL_CREAM | CUTANEOUS | Status: DC | PRN
  Administered 2022-05-30 – 2022-06-14 (×7): 1 via TOPICAL
  Filled 2022-05-29 (×2): qty 85

## 2022-05-29 NOTE — Plan of Care (Signed)

## 2022-05-29 NOTE — Progress Notes (Signed)
PT Cancellation Note  Patient Details Name: Wayne Stokes MRN: [id number] DOB: September 24, 1965   Cancelled Treatment:    Reason Eval/Treat Not Completed: (P) Other (comment) (attempt ~12:15pm, translator on lunch break.) Did not have time to re-attempt. Will continue efforts per PT plan of care next AM.   Kara Pacer [facility] 05/29/2022, 6:54 PM* delayed entry

## 2022-05-29 NOTE — Progress Notes (Signed)
PROGRESS NOTE    Wayne Stokes  AYT:[id number] DOB: 04-Sep-1966 DOA: 05/26/2022 PCP: [name], NP   Brief Narrative:  Wayne Stokes is a Wayne Stokes speaking 56 y.o. 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  Orthopedics recommending admission to [facility] for further evaluation.  Neurosurgery evaluated and he complained of severe and worsening back pain that radiated into his left buttocks down his left leg stopping on his mid thigh.  Over the last week the pain had made ambulation difficult and he remained neurologically intact.  Neurosurgery evaluated and felt that he does not need any acute surgical intervention and they feel that some of his current symptoms are related to the lower lumbar region and not a compression fracture as they would expect the back to be close to the upper lumbar region.  They are recommending continuing activity modifications and TSO brace as needed as well as pain control.  PT OT recommending home health.  If patient's symptoms fail to improve with conservative management kyphoplasty could be considered we will consult interventional radiology given that he continues to have significant back pain.  Assessment and Plan: Spinal stenosis of lumbar region with neurogenic claudication T12 compression fracture -Reviewed with neurosurgery and orthopedics -Per orthopedics note that they are unable to provide services on thoracic spine, and there are 2 spine surgeons Dr. Lorin Mercy and Dr. Remigio Eisenmenger is retiring, recommend follow-up with neurosurgery -Neurosurgery consulted and recommending no acute surgical intervention - TLSO for comfort - Robaxin - Oxycodone and acetaminophen - Calcitonin nasal spray  for compression fracture - Outpatient follow-up with neurosurgery and if he fails to improve with conservative management they are recommending considering kyphoplasty and I have consulted IR to evaluate given that he has continued pain -PT OT recommending home health   T12 compression fracture ([place]) -MRI done and showed "Acute compression fracture involving the T12 vertebral Stokes with up to 30% height loss and trace 2-3 mm bony retropulsion. No significant spinal stenosis.  Advanced degenerative disc disease and facet arthrosis at L4-5 with resultant severe spinal stenosis, with severe bilateral L4 foraminal narrowing. Reactive marrow edema about this level favored to be degenerative in nature, although possible changes of infection/osteomyelitis discitis difficult to exclude, and could be considered in the correct clinical setting.  Correlation with physical exam and laboratory values recommended" -Given his continued pain have consulted IR for evaluation for kyphoplasty   AKI (acute kidney injury) ([place]) -Mild AKI, Cr improved to 1.2 yesterday and now BUNs/creatinine is 15/1.15. - Foley catheter placed in ED and had to be reinserted today - Avoid diclofenac, losartan-HCTZ, metformin -We will need a trial of void -We will need to avoid further nephrotoxic medications, contrast dyes, hypotension and dehydration to ensure adequate renal perfusion and will need to renally dose medications -Repeat CMP within 1 week   Acute urinary retention  -Foley catheter is in place and will need a trial of void in the morning -Unfortunately patient failed his trial of void so Foley catheter will be need to be reinserted -He will need urology follow-up in the outpatient setting   Hyponatremia -Sodium is now improved to 136 -Continue with IV fluids with normal saline at 75 MLS per hour for 10 more hours -Continue to Monitor and Trend and repeat  CMP in the AM    Type 2 diabetes mellitus without complication,  without long-term current use of insulin (HCC) -Hold metformin and glipizide -Continue with sensitive NovoLog/scale insulin AC -CBGs ranging from 137-191   Leukocytosis -Due to pain and improved and trended down to 8.2 -> 8.1 -Continue to monitor and trend and repeat CBC in a.m.   Hypertension associated with diabetes (HCC) -BP controlled -Continue amlodipine -Hold Toprol, losartan, HCTZ -Continue monitor blood pressures per protocol   Mild cognitive impairment -Continue Donepezil.   Hyperlipidemia associated with type 2 diabetes mellitus (HCC) -Continue Atorvastatin 40 mg po Daily    Hypomagnesemia -Patient's magnesium level was 1.4 and improved to 2.1 -Replete with IV mag sulfate 4 g yesterday  -Continue monitor and trend and replete as necessary   Hypoalbuminemia -Patient's albumin level went from 2.7 is now 2.1 yesterday and today is 2.0 -Continue to monitor trend and repeat CMP in a.m.   Iron deficiency anemia and Microcytic Anemia -Hgb stable and will continue with ferrous sulfate -Anemia panel done and showed an iron level of 27, U IBC 177, TIBC of 204, saturation ratios of 13%, ferritin level 103, folate level 16.2 and vitamin B12 911 -Patient's hemoglobin/hematocrit went from 10.0/30.8 -> 8.6/26.5 -> 10.2/31.3 -> 8.4/26.0  -Continue to Monitor for S/Sx of Bleeding; No overt bleeding -Repeat CBC in the AM    Thrombocytosis -Mild and Likely reactive in the setting of Pain -Patient's Platelet Count went from 266 -> 548 -> 406 -> 410 -> 366   DVT prophylaxis: SCDs Start: 05/26/22 2254    Code Status: Full Code Family Communication: No family currently at bedside  Disposition Plan:  Level of care: Med-Surg Status is: Inpatient Remains inpatient appropriate because: Continues to have significant pain so we will obtain a an interventional radiology consultation to see if he is a candidate for kyphoplasty.  Unfortunately he continues to retain his urine so we will  need a reinsertion of his Foley catheter   Consultants:  Orthopedic surgery Neurosurgery Interventional Radiology  Procedures:  As delineated as above  Antimicrobials:  Anti-infectives (From admission, onward)    None       Subjective: Seen and examined at bedside with the phone translator Jamelle Haring and patient continues to have significant back pain and states that the pain medication helps but it starts to worsen when the medication fades.  He denies any nausea or vomiting.  Denies any chest pain or shortness of breath.  States that he has no urge to pee and after 6 hours he failed his trial of void so Foley catheter will be need to reinserted.  Given his continued back pain interventional radiology has been consulted for evaluation for potential kyphoplasty.  No other concerns or plans at this time.  Objective: Vitals:   05/28/22 1922 05/29/22 0407 05/29/22 0457 05/29/22 0756  BP: 122/83 (!) 112/59  (!) 134/98  Pulse: (!) 102 87  79  Resp: 16 17  16   Temp: 97.8 F (36.6 C) 98.7 F (37.1 C)  97.6 F (36.4 C)  TempSrc: Oral   Oral  SpO2: 93% 94%  100%  Weight:   61.4 kg     Intake/Output Summary (Last 24 hours) at 05/29/2022 1449 Last data filed at 05/29/2022 1400 Gross per 24 hour  Intake 600 ml  Output 1850 ml  Net -1250 ml   Filed Weights   05/28/22 0500 05/29/22 0457  Weight: 61.7 kg 61.4 kg   Examination: Physical Exam:  Constitutional: WN/WD  overweight male in no acute distress appears uncomfortable though Respiratory: Diminished to auscultation bilaterally, no wheezing, rales, rhonchi or crackles. Normal respiratory effort and patient is not tachypenic. No accessory muscle use.  Unlabored breathing Cardiovascular: RRR, no murmurs / rubs / gallops. S1 and S2 auscultated. No extremity edema.  Abdomen: Soft, non-tender, non-distended.  Bowel sounds positive.  GU: Deferred.  Foley catheter has been removed Musculoskeletal: No clubbing / cyanosis of digits/nails. No  joint deformity upper and lower extremities.  Skin: No rashes, lesions, ulcers on limited skin evaluation. No induration; Warm and dry.  Neurologic: CN 2-12 grossly intact with no focal deficits. Romberg sign and cerebellar reflexes not assessed.  Psychiatric: Normal judgment and insight. Alert and oriented x 3. Normal mood and appropriate affect.   Data Reviewed: I have personally reviewed following labs and imaging studies  CBC: Recent Labs  Lab 05/26/22 1320 05/27/22 0438 05/28/22 0939 05/29/22 0349  WBC 14.4* 8.9 8.2 8.1  NEUTROABS 10.0*  --  6.0 5.6  HGB 10.0* 8.6* 10.2* 8.4*  HCT 30.8* 26.5* 31.3* 26.0*  MCV 72.6* 72.6* 72.6* 72.6*  PLT 548* 406* 410* 366   Basic Metabolic Panel: Recent Labs  Lab 05/26/22 1320 05/27/22 0438 05/28/22 0939 05/29/22 0349  NA 127* 132* 130* 136  K 4.0 3.8 3.5 4.5  CL 89* 97* 94* 103  CO2 GLUCOSE 60* 72 195* 107*  BUN CREATININE 1.42* 1.26* 1.15 1.14  CALCIUM 8.7* 8.6* 8.5* 8.5*  MG  --   --  1.4* 2.1  PHOS  --   --  3.0 2.9   GFR: Estimated Creatinine Clearance: 55.2 mL/min (by C-G formula based on SCr of 1.14 mg/dL). Liver Function Tests: Recent Labs  Lab 05/26/22 1320 05/28/22 0939 05/29/22 0349  AST 21 21 14*  ALT ALKPHOS 83 67 65  BILITOT 0.4 0.5 0.4  PROT 6.9 5.7* 5.3*  ALBUMIN 2.7* 2.1* 2.0*   No results for input(s): "LIPASE", "AMYLASE" in the last 168 hours. No results for input(s): "AMMONIA" in the last 168 hours. Coagulation Profile: No results for input(s): "INR", "PROTIME" in the last 168 hours. Cardiac Enzymes: No results for input(s): "CKTOTAL", "CKMB", "CKMBINDEX", "TROPONINI" in the last 168 hours. BNP (last 3 results) No results for input(s): "PROBNP" in the last 8760 hours. HbA1C: No results for input(s): "HGBA1C" in the last 72 hours. CBG: Recent Labs  Lab 05/26/22 1446 05/28/22 1703 05/28/22 2043 05/29/22 0757 05/29/22 1158  GLUCAP 91 184* 137* 164* 191*    Lipid Profile: No results for input(s): "CHOL", "HDL", "LDLCALC", "TRIG", "CHOLHDL", "LDLDIRECT" in the last 72 hours. Thyroid Function Tests: No results for input(s): "TSH", "T4TOTAL", "FREET4", "T3FREE", "THYROIDAB" in the last 72 hours. Anemia Panel: Recent Labs    05/27/22 0438  VITAMINB12 911  FOLATE 16.2  FERRITIN 103  TIBC 204*  IRON 27*   Sepsis Labs: No results for input(s): "PROCALCITON", "LATICACIDVEN" in the last 168 hours.  No results found for this or any previous visit (from the past 240 hour(s)).   Radiology Studies: No results found.  Scheduled Meds:  acetaminophen  1,000 mg Oral TID   amLODipine  5 mg Oral Daily   atorvastatin  40 mg Oral Daily   Chlorhexidine Gluconate Cloth  6 each Topical Daily   donepezil  5 mg Oral QHS   ferrous sulfate  325 mg Oral Q breakfast   gabapentin  300 mg Oral TID  insulin aspart  0-9 Units Subcutaneous TID WC   Continuous Infusions:   LOS: 3 days   Marguerita Merles, DO [facility] Available via Epic secure chat 7am-7pm After these hours, please refer to coverage provider listed on [email or web address] 05/29/2022, 2:49 PM

## 2022-05-30 LAB — COMPREHENSIVE METABOLIC PANEL
ALT: 8 U/L (ref 0–44)
AST: 14 U/L — ABNORMAL LOW (ref 15–41)
Albumin: 1.7 g/dL — ABNORMAL LOW (ref 3.5–5.0)
Alkaline Phosphatase: 63 U/L (ref 38–126)
Anion gap: 9 (ref 5–15)
BUN: 10 mg/dL (ref 6–20)
CO2: 26 mmol/L (ref 22–32)
Calcium: 8.6 mg/dL — ABNORMAL LOW (ref 8.9–10.3)
Chloride: 99 mmol/L (ref 98–111)
Creatinine, Ser: 0.95 mg/dL (ref 0.61–1.24)
GFR, Estimated: >60 mL/min (ref >60)
Glucose, Bld: 151 mg/dL — ABNORMAL HIGH (ref 70–99)
Potassium: 3.6 mmol/L (ref 3.5–5.1)
Sodium: 134 mmol/L — ABNORMAL LOW (ref 135–145)
Total Bilirubin: 0.1 mg/dL — ABNORMAL LOW (ref 0.3–1.2)
Total Protein: 5.1 g/dL — ABNORMAL LOW (ref 6.5–8.1)

## 2022-05-30 LAB — CBC WITH DIFFERENTIAL/PLATELET
Abs Immature Granulocytes: 0.11 10*3/uL — ABNORMAL HIGH (ref 0.00–0.07)
Basophils Absolute: 0.1 10*3/uL (ref 0.0–0.1)
Basophils Relative: 1 %
Eosinophils Absolute: 0.2 10*3/uL (ref 0.0–0.5)
Eosinophils Relative: 2 %
HCT: 24.9 % — ABNORMAL LOW (ref 39.0–52.0)
Hemoglobin: 8 g/dL — ABNORMAL LOW (ref 13.0–17.0)
Immature Granulocytes: 1 %
Lymphocytes Relative: 11 %
Lymphs Abs: 1 10*3/uL (ref 0.7–4.0)
MCH: 23.1 pg — ABNORMAL LOW (ref 26.0–34.0)
MCHC: 32.1 g/dL (ref 30.0–36.0)
MCV: 72 fL — ABNORMAL LOW (ref 80.0–100.0)
Monocytes Absolute: 0.8 10*3/uL (ref 0.1–1.0)
Monocytes Relative: 8 %
Neutro Abs: 7.1 10*3/uL (ref 1.7–7.7)
Neutrophils Relative %: 77 %
Platelets: 323 10*3/uL (ref 150–400)
RBC: 3.46 MIL/uL — ABNORMAL LOW (ref 4.22–5.81)
RDW: 15.5 % (ref 11.5–15.5)
WBC: 9.2 10*3/uL (ref 4.0–10.5)
nRBC: 0 % (ref 0.0–0.2)

## 2022-05-30 LAB — PHOSPHORUS: Phosphorus: 3.2 mg/dL (ref 2.5–4.6)

## 2022-05-30 LAB — GLUCOSE, CAPILLARY
Glucose-Capillary: 109 mg/dL — ABNORMAL HIGH (ref 70–99)
Glucose-Capillary: 176 mg/dL — ABNORMAL HIGH (ref 70–99)
Glucose-Capillary: 192 mg/dL — ABNORMAL HIGH (ref 70–99)
Glucose-Capillary: 252 mg/dL — ABNORMAL HIGH (ref 70–99)

## 2022-05-30 LAB — MAGNESIUM: Magnesium: 1.4 mg/dL — ABNORMAL LOW (ref 1.7–2.4)

## 2022-05-30 MED ORDER — BISACODYL 10 MG RE SUPP
10.0000 mg | Freq: Every day | RECTAL | Status: DC | PRN
  Administered 2022-06-17: 10 mg via RECTAL
  Filled 2022-05-30 (×2): qty 1

## 2022-05-30 MED ORDER — PNEUMOCOCCAL 20-VAL CONJ VACC 0.5 ML IM SUSY
0.5000 mL | PREFILLED_SYRINGE | INTRAMUSCULAR | Status: AC
  Administered 2022-05-31: 0.5 mL via INTRAMUSCULAR
  Filled 2022-05-30: qty 0.5

## 2022-05-30 MED ORDER — METHOCARBAMOL 1000 MG/10ML IJ SOLN
500.0000 mg | Freq: Three times a day (TID) | INTRAVENOUS | Status: DC | PRN
  Administered 2022-05-30: 500 mg via INTRAVENOUS
  Filled 2022-05-30: qty 5

## 2022-05-30 MED ORDER — POLYETHYLENE GLYCOL 3350 17 G PO PACK
17.0000 g | PACK | Freq: Two times a day (BID) | ORAL | Status: DC
  Administered 2022-05-30 – 2022-07-11 (×54): 17 g via ORAL
  Filled 2022-05-30 (×63): qty 1

## 2022-05-30 MED ORDER — CEFAZOLIN SODIUM-DEXTROSE 2-4 GM/100ML-% IV SOLN: 2.0000 g | INTRAVENOUS | Status: DC

## 2022-05-30 MED ORDER — LIDOCAINE 5 % EX PTCH
1.0000 | MEDICATED_PATCH | CUTANEOUS | Status: DC
  Administered 2022-05-30 – 2022-08-01 (×56): 1 via TRANSDERMAL
  Filled 2022-05-30 (×54): qty 1

## 2022-05-30 MED ORDER — MAGNESIUM SULFATE 4 GM/100ML IV SOLN
4.0000 g | Freq: Once | INTRAVENOUS | Status: AC
  Administered 2022-05-30: 4 g via INTRAVENOUS
  Filled 2022-05-30: qty 100

## 2022-05-30 MED ORDER — GABAPENTIN 400 MG PO CAPS
400.0000 mg | ORAL_CAPSULE | Freq: Three times a day (TID) | ORAL | Status: DC
  Administered 2022-05-30 – 2022-06-10 (×32): 400 mg via ORAL
  Filled 2022-05-30 (×32): qty 1

## 2022-05-30 MED ORDER — OXYCODONE HCL 5 MG PO TABS
10.0000 mg | ORAL_TABLET | ORAL | Status: DC | PRN
  Administered 2022-05-30 – 2022-06-08 (×26): 10 mg via ORAL
  Filled 2022-05-30 (×26): qty 2

## 2022-05-30 MED ORDER — INFLUENZA VAC SUBUNIT QUAD 0.5 ML IM SUSY
0.5000 mL | PREFILLED_SYRINGE | INTRAMUSCULAR | Status: AC
  Administered 2022-05-31: 0.5 mL via INTRAMUSCULAR
  Filled 2022-05-30: qty 0.5

## 2022-05-30 MED ORDER — SENNOSIDES-DOCUSATE SODIUM 8.6-50 MG PO TABS
1.0000 | ORAL_TABLET | Freq: Two times a day (BID) | ORAL | Status: DC
  Administered 2022-05-30 – 2022-07-20 (×73): 1 via ORAL
  Filled 2022-05-30 (×83): qty 1

## 2022-05-30 NOTE — Consult Note (Signed)
Chief Complaint: Patient was seen in consultation today for  Chief Complaint  Patient presents with   Back Pain   at the request of Kerney Elbe  Referring Physician(s): Dr. Alfredia Ferguson  Supervising Physician: Katherina Right Lyla Glassing  Patient Status: [facility] - In-pt  History of Present Illness: Wayne Stokes is a 56 y.o. male with severe and progressively worsening low back pain that radiates into his left buttock and down his LLE.  Imaging demonstrates acute T12 fracture and severe spinal stenosis in the lumbar spine. 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  He reports his greatest pain is in his left buttocks at this time and the middle of his back generally only hurts with movement.  History obtained with the assistance of Keo, [name] interpreter by phone.  IR consulted for possible T12 Kyphoplasty.  There is plan for evaluation by Dr. Laurance Flatten for possible L4-5 fusion with decompression once compression fracture is healed.  Case reviewed and approved by Dr. Margarita Sermons   Past Medical History:  Diagnosis Date   Diabetes mellitus without complication ([place])    Hypertension     History reviewed. No pertinent surgical history.  Allergies: Beef-derived products, Pork-derived products, Shellfish allergy, Eggs or egg-derived products, and Other  Medications: Prior to Admission medications   Medication Sig Start Date End Date Taking? Authorizing Provider  amLODipine (NORVASC) 5 MG tablet Take 1 tablet (5 mg total) by mouth daily. 03/13/22 06/11/22  [name], [name], NP  atorvastatin (LIPITOR) 40 MG tablet Take 1 tablet (40 mg total) by mouth daily. 11/18/21   [name], NP  clotrimazole-betamethasone (LOTRISONE) cream Apply 1 Application topically daily. 03/17/22   [name], NP  diclofenac (VOLTAREN) 75 MG EC tablet Take 1 tablet (75 mg total) by mouth 2 (two) times daily. 05/19/22   [name],  PA-C  donepezil (ARICEPT) 5 MG tablet Take 1 tablet (5 mg total) by mouth at bedtime. 12/12/21   [name], MD  gabapentin (NEURONTIN) 300 MG capsule Take 1 capsule (300 mg total) by mouth 3 (three) times daily. 05/15/22 08/13/22  [name], NP  glipiZIDE (GLUCOTROL) 5 MG tablet Take 1 tablet (5 mg total) by mouth 2 (two) times daily before a meal. 03/17/22   [name], NP  losartan-hydrochlorothiazide (HYZAAR) 50-12.5 MG tablet Take 1 tablet by mouth daily. 12/16/21 05/16/22  [name], NP  metFORMIN (GLUCOPHAGE) 500 MG tablet Take 1 tablet (500 mg total) by mouth 2 (two) times daily with a meal. 11/15/21   [name], [name], NP  metoprolol succinate (TOPROL-XL) 25 MG 24 hr tablet Take 1 tablet (25 mg total) by mouth daily. 03/13/22 06/18/22  [name], [name], NP  Omega 3 1000 MG CAPS Take 1,000 mg by mouth daily.    [provider]  oxyCODONE-acetaminophen (PERCOCET) 5-325 MG tablet Take 1 tablet by mouth every 4 (four) hours as needed for severe pain. 05/19/22 05/19/23  [name], PA-C     Family History  Family history unknown: Yes    Social History   Socioeconomic History   Marital status: Single    Spouse name: Not on file   Number of children: Not on file   Years of education: Not on file   Highest education level: Not on file  Occupational History   Not on file  Tobacco Use   Smoking status: Never   Smokeless tobacco: Never  Vaping Use  Vaping Use: Never used  Substance and Sexual Activity   Alcohol use: Never   Drug use: Never   Sexual activity: Not Currently  Other Topics Concern   Not on file  Social History Narrative   Not on file   Social Determinants of Health   Financial Resource Strain: Not on file  Food Insecurity: Food Insecurity Present (05/27/2022)   Hunger Vital Sign    Worried About Running Out of Food in the Last Year: Sometimes true    Ran Out of Food in the Last Year: Sometimes true  Transportation Needs: Unmet  Transportation Needs (11/19/2021)   PRAPARE - Hydrologist (Medical): Yes    Lack of Transportation (Non-Medical): Yes  Physical Activity: Not on file  Stress: Not on file  Social Connections: Not on file    Review of Systems: A 12 point ROS discussed and pertinent positives are indicated in the HPI above.  All other systems are negative.  Vital Signs: BP (!) 135/96 (BP Location: Right Arm)   Pulse (!) 110   Temp 99.5 F (37.5 C) (Oral)   Resp 16   Wt 128 lb 15.5 oz (58.5 kg)   SpO2 95%   BMI 25.19 kg/m     Physical Exam Constitutional:      General: He is in acute distress.     Appearance: He is not toxic-appearing.  Cardiovascular:     Rate and Rhythm: Tachycardia present.     Pulses: Normal pulses.  Pulmonary:     Effort: Pulmonary effort is normal. No respiratory distress.  Musculoskeletal:        General: Tenderness present.       Arms:  Skin:    General: Skin is dry.  Neurological:     General: No focal deficit present.     Mental Status: He is alert.     Imaging: MR LUMBAR SPINE WO CONTRAST  Result Date: 05/26/2022 CLINICAL DATA:  Initial evaluation for acute low back pain. EXAM: MRI LUMBAR SPINE WITHOUT CONTRAST TECHNIQUE: Multiplanar, multisequence MR imaging of the lumbar spine was performed. No intravenous contrast was administered. COMPARISON:  Prior CT from 05/07/2022. FINDINGS: Segmentation:  Examination moderately degraded by motion artifact. Standard segmentation. Lowest well-formed disc space labeled the L5-S1 level. Alignment: Dextroscoliosis. Underlying straightening and mild reversal of the normal lumbar lordosis. Trace anterolisthesis of L4 on L5, likely degenerative. Vertebrae: Acute compression fracture involving the T12 vertebral body with up to 30% height loss and trace 2-3 mm bony retropulsion. Mild vertebral body height loss about the L4-5 interbody space, likely degenerative. Otherwise, vertebral body height  maintained with no other acute or chronic fracture. Underlying bone marrow signal intensity within normal limits. No worrisome osseous lesions. Reactive marrow edema about the L4-5 interspace favored to be degenerative in nature. Possible changes of superimposed infection difficult to exclude, and could be considered in the correct clinical setting. Conus medullaris and cauda equina: Conus extends to approximately the L1 level. Conus medullaris grossly within normal limits on this motion degraded exam. Nerve roots of the cauda equina grossly within normal limits as well, although evaluation limited by motion. Paraspinal and other soft tissues: Mild paraspinous edema adjacent to the L4-5 interspace, favored to be reactive. Paraspinous soft tissues demonstrate no other acute finding. Disc levels: A degree of underlying congenital spinal stenosis noted. T12-L1: Trace 2-3 mm bony retropulsion related to the T12 compression fracture. Underlying mild disc bulge, eccentric to the left. Associated annular fissure. Mild facet  hypertrophy. No significant spinal stenosis. Mild left foraminal narrowing. Right neural foramen remains patent. L1-2: Disc desiccation with mild disc bulge and reactive endplate spurring. No significant spinal stenosis. Foramina remain patent. L2-3: Disc desiccation with mild disc bulge and reactive endplate spurring. Mild facet hypertrophy. Epidural lipomatosis. Underlying short pedicles with resultant mild-to-moderate spinal stenosis. Mild right L2 foraminal narrowing. Left neural foramen remains patent. L3-4: Disc desiccation with mild disc bulge and reactive endplate spurring. Mild facet hypertrophy. Epidural lipomatosis. Changes superimposed on underlying short pedicles resultant mild-to-moderate spinal stenosis. Mild right with mild-to-moderate left L3 foraminal stenosis. L4-5: Advanced degenerative intervertebral disc space narrowing with disc desiccation and diffuse disc bulge. Reactive  endplate change with endplate osteophytic spurring and marrow edema. Probable large left extraforaminal/far lateral disc extrusion with inferior migration, better seen on prior CT from 05/07/2022 (series 3, image 103 on that exam). Moderate bilateral facet arthrosis. Changes superimposed on short pedicles resultant severe spinal stenosis. Severe bilateral L4 foraminal narrowing. L5-S1: Negative interspace. Minimal facet spurring with epidural lipomatosis. No significant spinal stenosis. Foramina remain patent. IMPRESSION: 1. Acute compression fracture involving the T12 vertebral body with up to 30% height loss and trace 2-3 mm bony retropulsion. No significant spinal stenosis. 2. Advanced degenerative disc disease and facet arthrosis at L4-5 with resultant severe spinal stenosis, with severe bilateral L4 foraminal narrowing. Reactive marrow edema about this level favored to be degenerative in nature, although possible changes of infection/osteomyelitis discitis difficult to exclude, and could be considered in the correct clinical setting. Correlation with physical exam and laboratory values recommended. Electronically Signed   By: Jeannine Boga M.D.   On: 05/26/2022 19:24   CT CHEST ABDOMEN PELVIS W CONTRAST  Result Date: 05/07/2022 CLINICAL DATA:  Trauma.  Back and abdominal pain. EXAM: CT CHEST, ABDOMEN, AND PELVIS WITH CONTRAST TECHNIQUE: Multidetector CT imaging of the chest, abdomen and pelvis was performed following the standard protocol during bolus administration of intravenous contrast. RADIATION DOSE REDUCTION: This exam was performed according to the departmental dose-optimization program which includes automated exposure control, adjustment of the mA and/or kV according to patient size and/or use of iterative reconstruction technique. CONTRAST:  61mL OMNIPAQUE IOHEXOL 300 MG/ML  SOLN COMPARISON:  CT of the thoracic and lumbar spine dated 05/07/2022. Lumbar spine MRI dated 02/25/2022. FINDINGS:  CT CHEST FINDINGS Cardiovascular: There is no cardiomegaly or pericardial effusion. Mild atherosclerotic calcification of the thoracic aorta. No aneurysmal dilatation or dissection. The origins of the great vessels of the aortic arch and the central pulmonary arteries are patent. Mediastinum/Nodes: No hilar or mediastinal adenopathy. The esophagus and the thyroid gland are grossly unremarkable. No mediastinal fluid collection. Lungs/Pleura: The lungs are clear. There is no pleural effusion or pneumothorax. The central airways are patent. Musculoskeletal: Mildly displaced fracture of the lateral left tenth rib. There is acute compression fracture of the T12 vertebra with approximately 25% loss of vertebral body height. Severe degenerative changes of the right shoulder. CT ABDOMEN PELVIS FINDINGS No intra-abdominal free air or free fluid. Hepatobiliary: No focal liver abnormality is seen. No gallstones, gallbladder wall thickening, or biliary dilatation. Pancreas: Unremarkable. No pancreatic ductal dilatation or surrounding inflammatory changes. Spleen: Normal in size without focal abnormality. Adrenals/Urinary Tract: The adrenal glands unremarkable. There is no hydronephrosis on either side. There is symmetric enhancement and excretion of contrast by both kidneys. The visualized ureters and urinary bladder appear unremarkable. Stomach/Bowel: There is focal area of outpouching of the medial wall of the duodenal bulb (62/3). There is mild associated wall  thickening and inflammatory changes. Findings may represent an inflamed duodenal ulcer versus less likely an acute diverticulitis. No perforation or abscess. There is no bowel obstruction. The appendix is normal. Vascular/Lymphatic: Mild aortoiliac atherosclerotic disease. The IVC is unremarkable. No portal venous gas. There is no adenopathy. Reproductive: The prostate and seminal vesicles are grossly unremarkable. No pelvic mass. Other: None Musculoskeletal: Severe  degenerative changes at L4-L5 with disc desiccation and vacuum phenomena. There is posterior disc bulge and facet arthropathy at this level. No acute osseous pathology. IMPRESSION: 1. Mildly displaced fracture of the lateral left tenth rib. 2. Acute compression fracture of the T12 vertebra with approximately 25% loss of vertebral body height. 3. Inflamed duodenal ulcer versus less likely an acute diverticulitis. No perforation or abscess. 4. Aortic Atherosclerosis (ICD10-I70.0). Electronically Signed   By: Anner Crete M.D.   On: 05/07/2022 18:09   CT L-SPINE NO CHARGE  Result Date: 05/07/2022 CLINICAL DATA:  Trauma.  Back pain abdominal pain. EXAM: CT THORACIC AND LUMBAR SPINE WITHOUT CONTRAST TECHNIQUE: Multidetector CT imaging of the thoracic and lumbar spine was performed without contrast. Multiplanar CT image reconstructions were also generated. RADIATION DOSE REDUCTION: This exam was performed according to the departmental dose-optimization program which includes automated exposure control, adjustment of the mA and/or kV according to patient size and/or use of iterative reconstruction technique. COMPARISON:  CT of the chest abdomen pelvis dated 05/07/2022. FINDINGS: CT THORACIC SPINE FINDINGS Alignment: No acute subluxation. Vertebrae: Acute compression fracture of T12 with approximately 25% loss of vertebral body height. There is extension of the fracture into the inferior endplate. No retropulsion. Paraspinal and other soft tissues: Negative. Disc levels: Mild degenerative changes. CT LUMBAR SPINE FINDINGS Segmentation: 5 lumbar type vertebrae. Alignment: No acute subluxation. There is straightening of normal lumbar lordosis which may be positional or due to muscle spasm. Vertebrae: No acute fracture. Paraspinal and other soft tissues: Negative. Disc levels: Severe degenerative changes at L4-L5 with disc desiccation and vacuum phenomena. There is posterior disc bulge and moderate facet arthropathy at  this level. IMPRESSION: Acute compression fracture of T12 with approximately 25% loss of vertebral body height. No retropulsion. Electronically Signed   By: Anner Crete M.D.   On: 05/07/2022 18:00   CT T-SPINE NO CHARGE  Result Date: 05/07/2022 CLINICAL DATA:  Trauma.  Back pain abdominal pain. EXAM: CT THORACIC AND LUMBAR SPINE WITHOUT CONTRAST TECHNIQUE: Multidetector CT imaging of the thoracic and lumbar spine was performed without contrast. Multiplanar CT image reconstructions were also generated. RADIATION DOSE REDUCTION: This exam was performed according to the departmental dose-optimization program which includes automated exposure control, adjustment of the mA and/or kV according to patient size and/or use of iterative reconstruction technique. COMPARISON:  CT of the chest abdomen pelvis dated 05/07/2022. FINDINGS: CT THORACIC SPINE FINDINGS Alignment: No acute subluxation. Vertebrae: Acute compression fracture of T12 with approximately 25% loss of vertebral body height. There is extension of the fracture into the inferior endplate. No retropulsion. Paraspinal and other soft tissues: Negative. Disc levels: Mild degenerative changes. CT LUMBAR SPINE FINDINGS Segmentation: 5 lumbar type vertebrae. Alignment: No acute subluxation. There is straightening of normal lumbar lordosis which may be positional or due to muscle spasm. Vertebrae: No acute fracture. Paraspinal and other soft tissues: Negative. Disc levels: Severe degenerative changes at L4-L5 with disc desiccation and vacuum phenomena. There is posterior disc bulge and moderate facet arthropathy at this level. IMPRESSION: Acute compression fracture of T12 with approximately 25% loss of vertebral body height. No retropulsion. Electronically  Signed   By: Anner Crete M.D.   On: 05/07/2022 18:00   CT HEAD WO CONTRAST (5MM)  Result Date: 05/07/2022 CLINICAL DATA:  Possible fall, neck injury. EXAM: CT HEAD WITHOUT CONTRAST CT CERVICAL SPINE  WITHOUT CONTRAST TECHNIQUE: Multidetector CT imaging of the head and cervical spine was performed following the standard protocol without intravenous contrast. Multiplanar CT image reconstructions of the cervical spine were also generated. RADIATION DOSE REDUCTION: This exam was performed according to the departmental dose-optimization program which includes automated exposure control, adjustment of the mA and/or kV according to patient size and/or use of iterative reconstruction technique. COMPARISON:  December 05, 2021. FINDINGS: CT HEAD FINDINGS Brain: No evidence of acute infarction, hemorrhage, hydrocephalus, extra-axial collection or mass lesion/mass effect. Vascular: No hyperdense vessel or unexpected calcification. Skull: Normal. Negative for fracture or focal lesion. Sinuses/Orbits: No acute finding. Other: None. CT CERVICAL SPINE FINDINGS Alignment: Normal. Skull base and vertebrae: No acute fracture. No primary bone lesion or focal pathologic process. Soft tissues and spinal canal: No prevertebral fluid or swelling. No visible canal hematoma. Disc levels: Moderate degenerative disc disease is noted at C4-5 and C5-6. Upper chest: Negative. Other: None. IMPRESSION: No acute intracranial abnormality seen. 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 No fracture or spondylolisthesis is noted. Electronically Signed   By: Marijo Conception M.D.   On: 05/07/2022 17:57   CT Cervical Spine Wo Contrast  Result Date: 05/07/2022 CLINICAL DATA:  Possible fall, neck injury. EXAM: CT HEAD WITHOUT CONTRAST CT CERVICAL SPINE WITHOUT CONTRAST TECHNIQUE: Multidetector CT imaging of the head and cervical spine was performed following the standard protocol without intravenous contrast. Multiplanar CT image reconstructions of the cervical spine were also generated. RADIATION DOSE REDUCTION: This exam was performed according to the departmental dose-optimization program which includes automated  exposure control, adjustment of the mA and/or kV according to patient size and/or use of iterative reconstruction technique. COMPARISON:  December 05, 2021. FINDINGS: CT HEAD FINDINGS Brain: No evidence of acute infarction, hemorrhage, hydrocephalus, extra-axial collection or mass lesion/mass effect. Vascular: No hyperdense vessel or unexpected calcification. Skull: Normal. Negative for fracture or focal lesion. Sinuses/Orbits: No acute finding. Other: None. CT CERVICAL SPINE FINDINGS Alignment: Normal. Skull base and vertebrae: No acute fracture. No primary bone lesion or focal pathologic process. Soft tissues and spinal canal: No prevertebral fluid or swelling. No visible canal hematoma. Disc levels: Moderate degenerative disc disease is noted at C4-5 and C5-6. Upper chest: Negative. Other: None. IMPRESSION: No acute intracranial abnormality seen. 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 No fracture or spondylolisthesis is noted. Electronically Signed   By: Marijo Conception M.D.   On: 05/07/2022 17:57    Labs:  CBC: Recent Labs    05/27/22 0438 05/28/22 0939 05/29/22 0349 05/30/22 0253  WBC 8.9 8.2 8.1 9.2  HGB 8.6* 10.2* 8.4* 8.0*  HCT 26.5* 31.3* 26.0* 24.9*  PLT 406* 410* 366 323    BMP: Recent Labs    05/27/22 0438 05/28/22 0939 05/29/22 0349 05/30/22 0253  NA 132* 130* 136 134*  K 3.8 3.5 4.5 3.6  CL 97* 94* 103 99  CO2 28 27 25 26   GLUCOSE 72 195* 107* 151*  BUN 12 15 15 10   CALCIUM 8.6* 8.5* 8.5* 8.6*  CREATININE 1.26* 1.15 1.14 0.95  GFRNONAA >60 >60 >60 >60    LIVER FUNCTION TESTS: Recent Labs    05/26/22 1320 05/28/22 0939 05/29/22 0349 05/30/22 0253  BILITOT 0.4 0.5 0.4 0.1*  AST 21 21 14* 14*  ALT 11 10 9 8   ALKPHOS 83 67 65 63  PROT 6.9 5.7* 5.3* 5.1*  ALBUMIN 2.7* 2.1* 2.0* 1.7*   Assessment and Plan:  Back Pain --most significant in the distribution of the left sciatic nerve --there is point tenderness along the  mid and lower spine. --Acute T12 compression fracture is amenable to kyphoplasty.  Will set up for early next week.  No pre-auth required.  Orders placed.  Risks and benefits of T12 KP were discussed with the patient including, but not limited to education regarding the natural healing process of compression fractures without intervention, bleeding, infection, cement migration which may cause spinal cord damage, paralysis, pulmonary embolism or even death.  This interventional procedure involves the use of X-rays and because of the nature of the planned procedure, it is possible that we will have prolonged use of X-ray fluoroscopy.  Potential radiation risks to you include (but are not limited to) the following: - A slightly elevated risk for cancer  several years later in life. This risk is typically less than 0.5% percent. This risk is low in comparison to the normal incidence of human cancer, which is 33% for women and 50% for men according to the [place]. - Radiation induced injury can include skin redness, resembling a rash, tissue breakdown / ulcers and hair loss (which can be temporary or permanent).   The likelihood of either of these occurring depends on the difficulty of the procedure and whether you are sensitive to radiation due to previous procedures, disease, or genetic conditions.   IF your procedure requires a prolonged use of radiation, you will be notified and given written instructions for further action.  It is your responsibility to monitor the irradiated area for the 2 weeks following the procedure and to notify your physician if you are concerned that you have suffered a radiation induced injury.    All of the patient's questions were answered, patient is agreeable to proceed.  Consent signed and in chart.    Thank you for this interesting consult.  I greatly enjoyed meeting Burlie Perriello and look forward to participating in their care.  A copy of this report  was sent to the requesting provider on this date.  Electronically Signed: Pasty Spillers, PA 05/30/2022, 1:57 PM   I spent a total of 55 Mi[place] in face to face in clinical consultation, greater than 50% of which was counseling/coordinating care for T12 fracture.

## 2022-05-30 NOTE — Progress Notes (Signed)
PROGRESS NOTE    Wayne Stokes  ZOX:[id number] DOB: Jan 21, 1966 DOA: 05/26/2022 PCP: [name], NP   Brief Narrative:  Wayne Stokes is a [name] speaking 56 y.o. 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  Orthopedics recommending admission to [facility] for further evaluation.  Neurosurgery evaluated and he complained of severe and worsening back pain that radiated into his left buttocks down his left leg stopping on his mid thigh.  Over the last week the pain had made ambulation difficult and he remained neurologically intact.  Neurosurgery evaluated and felt that he does not need any acute surgical intervention and they feel that some of his current symptoms are related to the lower lumbar region and not a compression fracture as they would expect the back to be close to the upper lumbar region.  They are recommending continuing activity modifications and TSO brace as needed as well as pain control.  PT OT recommending home health.  If patient's symptoms fail to improve with conservative management kyphoplasty could be considered we will consult interventional radiology given that he continues to have significant back pain and his anatomy is amenable to kyphoplasty so this is going to be done next week per IR.  Assessment and Plan: Spinal stenosis of lumbar region with neurogenic claudication T12 compression fracture -Reviewed with neurosurgery and orthopedics -Per orthopedics note that they are unable to provide services on thoracic spine, and there are 2 spine surgeons [name] and Dr. Quentin Angst is retiring, recommend follow-up with neurosurgery -Neurosurgery consulted and recommending no acute surgical  intervention - TLSO for comfort - Robaxin - Oxycodone and acetaminophen and increasing Gabapentin and adding a Lidocain Patch  - Calcitonin nasal spray for compression fracture - Outpatient follow-up with neurosurgery and if he fails to improve with conservative management they are recommending considering kyphoplasty and I have consulted IR to evaluate given that he has continued pain and see belwo  -PT OT recommending home health   T12 compression fracture (HCC) -MRI done and showed "Acute compression fracture involving the T12 vertebral body with up to 30% height loss and trace 2-3 mm bony retropulsion. No significant spinal stenosis.  Advanced degenerative disc disease and facet arthrosis at L4-5 with resultant severe spinal stenosis, with severe bilateral L4 foraminal narrowing. Reactive marrow edema about this level favored to be degenerative in nature, although possible changes of infection/osteomyelitis discitis difficult to exclude, and could be considered in the correct clinical setting.  Correlation with physical exam and laboratory values recommended" -Given his continued pain have consulted IR for evaluation for kyphoplasty and it is amenable and will be getting set up for next week   AKI (acute kidney injury) (HCC) -Mild AKI, Cr improved to 1.2 yesterday and now BUNs/creatinine is 15/1.14 -> 10/0.95. -Foley catheter placed in ED and had to be reinserted today - Avoid diclofenac, losartan-HCTZ, metformin -We will need a trial of void -We will need to avoid further nephrotoxic medications, contrast dyes, hypotension and dehydration to ensure adequate renal perfusion and will need to renally dose medications -Repeat CMP within 1 week   Acute urinary retention  -Foley catheter is in place and will need a trial of void in the morning -Unfortunately patient failed his trial of void so Foley catheter will be  need to be reinserted -He will need urology follow-up in the outpatient  setting   Hyponatremia -Sodium is now improved to 136 yesterday and is now 134 -IVF now stopped -Continue to Monitor and Trend and repeat CMP in the AM    Type 2 diabetes mellitus without complication, without long-term current use of insulin (HCC) -Hold metformin and glipizide -Continue with sensitive NovoLog/scale insulin AC -CBGs ranging from 109-192   Leukocytosis -Due to pain and improved and trended down to 8.2 -> 8.1 -> 9.2 -Continue to monitor and trend and repeat CBC in a.m.   Hypertension associated with diabetes (HCC) -BP controlled -Continue amlodipine -Hold Toprol, losartan, HCTZ -Continue monitor blood pressures per protocol -Last BP reading was 138/89   Mild cognitive impairment -Continue Donepezil.   Hyperlipidemia associated with type 2 diabetes mellitus (HCC) -Continue Atorvastatin 40 mg po Daily    Hypomagnesemia -Patient's magnesium level was 1.4 and improved to 2.1 and is now 1.4 again  -Replete with IV mag sulfate 4 g yesterday  -Continue monitor and trend and replete as necessary   Hypoalbuminemia -Patient's albumin level went from 2.7 -> 2.1 -> 2.0 -> 1.7 -Continue to monitor trend and repeat CMP in a.m.   Iron deficiency anemia and Microcytic Anemia -Hgb stable and will continue with ferrous sulfate -Anemia panel done and showed an iron level of 27, U IBC 177, TIBC of 204, saturation ratios of 13%, ferritin level 103, folate level 16.2 and vitamin B12 911 -Patient's hemoglobin/hematocrit went from 10.0/30.8 -> 8.6/26.5 -> 10.2/31.3 -> 8.4/26.0 -> 8.0/24.9 -Continue to Monitor for S/Sx of Bleeding; No overt bleeding -Repeat CBC in the AM    Thrombocytosis -Mild and Likely reactive in the setting of Pain -Patient's Platelet Count went from 266 -> 548 -> 406 -> 410 -> 366 -> 323  DVT prophylaxis: SCDs Start: 05/26/22 2254    Code Status: Full Code Family Communication: No family present at bedside   Disposition Plan:  Level of care:  Med-Surg Status is: Inpatient Remains inpatient appropriate because: Continues to have significant amount of back pain and needs kyphoplasty and this will be done next week.   Consultants:  Orthopedic surgery Neurosurgery Interventional radiology  Procedures:  As delineated above  Antimicrobials:  Anti-infectives (From admission, onward)    Start     Dose/Rate Route Frequency Ordered Stop   06/02/22 0000  ceFAZolin (ANCEF) IVPB 2g/100 mL premix        2 g 200 mL/hr over 30 Minutes Intravenous To Radiology 05/30/22 1504 06/03/22 0000       Subjective: Seen and examined at bedside with the translator Wayne Stokes and patient was having significant amount of back pain today.  Had difficult time ambulating and states most the pain was in his low back and buttock area on the left side.  Saw interventional radiology and patient will go kyphoplasty next week.  He denies any other concerns or complaints this time.  Objective: Vitals:   05/29/22 2253 05/30/22 0358 05/30/22 0822 05/30/22 1647  BP:  (!) 133/95 (!) 135/96 138/89  Pulse:  92 (!) 110 (!) 109  Resp:  Temp:  98.7 F (37.1 C) 99.5 F (37.5 C) (!) 97.5 F (36.4 C)  TempSrc:  Oral Oral Oral  SpO2:  96% 95% 95%  Weight: 58.5 kg       Intake/Output Summary (Last 24 hours) at 05/30/2022 1857 Last data filed at 05/30/2022 1827 Gross per 24 hour  Intake 1048.78 ml  Output  1900 ml  Net -851.22 ml   Filed Weights   05/28/22 0500 05/29/22 0457 05/29/22 2253  Weight: 61.7 kg 61.4 kg 58.5 kg   Examination: Physical Exam:  Constitutional: WN/WD overweight male in mild distress appears uncomfortable Respiratory: Diminished to auscultation bilaterally, no wheezing, rales, rhonchi or crackles. Normal respiratory effort and patient is not tachypenic. No accessory muscle use.  Unlabored breathing Cardiovascular: RRR, no murmurs / rubs / gallops. S1 and S2 auscultated.  Has trace lower extremity edema Abdomen: Soft, non-tender,  distended secondary body habitus. Bowel sounds positive.  GU: Deferred. Musculoskeletal: No clubbing / cyanosis of digits/nails. No joint deformity upper and lower extremities.  Skin: No rashes, lesions, ulcers on limited skin evaluation. No induration; Warm and dry.  Neurologic: CN 2-12 grossly intact with no focal deficits.  Romberg sign and cerebellar reflexes not assessed.  Psychiatric: Normal judgment and insight. Alert and oriented x 3. Normal mood and appropriate affect.   Data Reviewed: I have personally reviewed following labs and imaging studies  CBC: Recent Labs  Lab 05/26/22 1320 05/27/22 0438 05/28/22 0939 05/29/22 0349 05/30/22 0253  WBC 14.4* 8.9 8.2 8.1 9.2  NEUTROABS 10.0*  --  6.0 5.6 7.1  HGB 10.0* 8.6* 10.2* 8.4* 8.0*  HCT 30.8* 26.5* 31.3* 26.0* 24.9*  MCV 72.6* 72.6* 72.6* 72.6* 72.0*  PLT 548* 406* 410* 366 295   Basic Metabolic Panel: Recent Labs  Lab 05/26/22 1320 05/27/22 0438 05/28/22 0939 05/29/22 0349 05/30/22 0253  NA 127* 132* 130* 136 134*  K 4.0 3.8 3.5 4.5 3.6  CL 89* 97* 94* 103 99  CO2 23 28 27 25 26   GLUCOSE 60* 72 195* 107* 151*  BUN 13 12 15 15 10   CREATININE 1.42* 1.26* 1.15 1.14 0.95  CALCIUM 8.7* 8.6* 8.5* 8.5* 8.6*  MG  --   --  1.4* 2.1 1.4*  PHOS  --   --  3.0 2.9 3.2   GFR: Estimated Creatinine Clearance: 60.7 mL/min (by C-G formula based on SCr of 0.95 mg/dL). Liver Function Tests: Recent Labs  Lab 05/26/22 1320 05/28/22 0939 05/29/22 0349 05/30/22 0253  AST 21 21 14* 14*  ALT 11 10 9 8   ALKPHOS 83 67 65 63  BILITOT 0.4 0.5 0.4 0.1*  PROT 6.9 5.7* 5.3* 5.1*  ALBUMIN 2.7* 2.1* 2.0* 1.7*   No results for input(s): "LIPASE", "AMYLASE" in the last 168 hours. No results for input(s): "AMMONIA" in the last 168 hours. Coagulation Profile: No results for input(s): "INR", "PROTIME" in the last 168 hours. Cardiac Enzymes: No results for input(s): "CKTOTAL", "CKMB", "CKMBINDEX", "TROPONINI" in the last 168  hours. BNP (last 3 results) No results for input(s): "PROBNP" in the last 8760 hours. HbA1C: No results for input(s): "HGBA1C" in the last 72 hours. CBG: Recent Labs  Lab 05/29/22 1648 05/29/22 2127 05/30/22 0819 05/30/22 1136 05/30/22 1641  GLUCAP 119* 179* 109* 176* 192*   Lipid Profile: No results for input(s): "CHOL", "HDL", "LDLCALC", "TRIG", "CHOLHDL", "LDLDIRECT" in the last 72 hours. Thyroid Function Tests: No results for input(s): "TSH", "T4TOTAL", "FREET4", "T3FREE", "THYROIDAB" in the last 72 hours. Anemia Panel: No results for input(s): "VITAMINB12", "FOLATE", "FERRITIN", "TIBC", "IRON", "RETICCTPCT" in the last 72 hours. Sepsis Labs: No results for input(s): "PROCALCITON", "LATICACIDVEN" in the last 168 hours.  No results found for this or any previous visit (from the past 240 hour(s)).   Radiology Studies: No results found.  Scheduled Meds:  acetaminophen  1,000 mg Oral TID   amLODipine  5 mg Oral Daily   atorvastatin  40 mg Oral Daily   Chlorhexidine Gluconate Cloth  6 each Topical Daily   donepezil  5 mg Oral QHS   ferrous sulfate  325 mg Oral Q breakfast   gabapentin  300 mg Oral TID   [START ON 05/31/2022] influenza vaccine  0.5 mL Intramuscular Tomorrow-1000   insulin aspart  0-9 Units Subcutaneous TID WC   lidocaine  1 patch Transdermal Q24H   [START ON 05/31/2022] pneumococcal 20-valent conjugate vaccine  0.5 mL Intramuscular Tomorrow-1000   polyethylene glycol  17 g Oral BID   senna-docusate  1 tablet Oral BID   Continuous Infusions:  [START ON 06/02/2022]  ceFAZolin (ANCEF) IV     methocarbamol (ROBAXIN) IV Stopped (05/30/22 1726)    LOS: 4 days   Marguerita Merles, DO [facility] Available via Epic secure chat 7am-7pm After these hours, please refer to coverage provider listed on [email or web address] 05/30/2022, 6:57 PM

## 2022-05-30 NOTE — Progress Notes (Signed)
Physical Therapy Treatment Patient Details Name: Wayne Stokes MRN: [id number] DOB: 17-Sep-1965 Today's Date: 05/30/2022   History of Present Illness 56 y.o. male presented 05/26/22 with c/o severe and progressively worsening low back pain that radiates into his left buttock and down his LLE. Imaging +acute T12 compression fracture and  L4/5 there is multifactorial severe spinal stenosis and severe bilateral foraminal stenosis. Neurosurgery consult with no acute surgical intervention needed. 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    PT Comments    Pt received in supine, agreeable to therapy session with encouragement, translation via [facility] interpreter # [phone]. Pt limited due to severe reported L sided lower back pain, worse at L glute/lower back region and needing increased assist up to maxA for seated scooting and sit<>stand transfers, pt unable to initiate gait training or transfer to chair today due to pain and BLE buckling. Pt needing mod to maxA for transfer to/from EOB and rolling. Emphasis on back precautions, supine LE exercise technique within precautions, pt will need reinforcement due to language barrier, restlessness and hx cognitive impairment. Pt continues to benefit from PT services to progress toward functional mobility goals. Per MD, plan for evaluation today by IR, may need PT re-eval or assessment next session if plan for surgery, pt not progressing and not able to ambulate at current level.   Recommendations for follow up therapy are one component of a multi-disciplinary discharge planning process, led by the attending physician.  Recommendations may be updated based on patient status, additional functional criteria and insurance authorization.  Follow Up Recommendations  Home health PT (pending progress and medical/surgical plan; pt not currently able to  ambulate due to pain and not yet cleared for home)     Assistance Recommended at Discharge Intermittent Supervision/Assistance  Patient can return home with the following A little help with bathing/dressing/bathroom;Two people to help with walking and/or transfers;Help with stairs or ramp for entrance;Assist for transportation   Equipment Recommendations  BSC/3in1    Recommendations for Other Services       Precautions / Restrictions Precautions Precautions: Fall;Back Precaution Booklet Issued: No Precaution Comments: reviewed BLT back rule for pt comfort Required Braces or Orthoses: Spinal Brace Spinal Brace: Thoracolumbosacral orthotic;Applied in sitting position (when OOB, for comfort) Restrictions Weight Bearing Restrictions: No     Mobility  Bed Mobility Overal bed mobility: Needs Assistance Bed Mobility: Rolling, Sidelying to Sit, Sit to Sidelying Rolling: Min assist Sidelying to sit: Mod assist     Sit to sidelying: Mod assist General bed mobility comments: min-mod assist for trunk and LE management, roll to/from R EOB. Increased time, sequencing cues and translation via speakerphone with montagnard translator Keo. Pt also rolled ~6 other reps for removal/placement of clean bed pad and hygiene assist, NT to room at end of session to assist with hygiene.    Transfers Overall transfer level: Needs assistance Equipment used: Rolling walker (2 wheels) Transfers: Sit to/from Stand Sit to Stand: Max assist           General transfer comment: assist for rise and steady, cues for hand placement. Pt with BLE buckling and unable to maintain upright >10 seconds, needs maxA for attempt at sidestepping. Pt then performed scooting on EOB toward his R side with maxA, pt with difficulty follow scooting instructions 2/2 pain/internal distraction and delay with translation.    Ambulation/Gait Ambulation/Gait assistance: Max assist   Assistive device: Rolling walker (  2  wheels) Gait Pattern/deviations: Trunk flexed, Step-to pattern, Knees buckling, Narrow base of support       General Gait Details: assist to steady, cues for upright posture and placement in RW, pt with poor carryover of cues due to pain level, translator delay and BLE buckling.   Stairs             Wheelchair Mobility    Modified Rankin (Stroke Patients Only)       Balance Overall balance assessment: Needs assistance, History of Falls Sitting-balance support: Feet supported, No upper extremity supported Sitting balance-Leahy Scale: Fair     Standing balance support: Bilateral upper extremity supported, During functional activity, Reliant on assistive device for balance Standing balance-Leahy Scale: Poor Standing balance comment: maxA for static balance, zero dynamic standing balance at RW due to BLE buckling/pain                            Cognition Arousal/Alertness: Awake/alert Behavior During Therapy: Restless, Impulsive Overall Cognitive Status: Within Functional Limits for tasks assessed                                 General Comments: documented history of MCI, pt c/o increased pain today and more restless/impulsive, tending to twist back somewhat despite verbal/tactile cues and translation.        Exercises Other Exercises Other Exercises: supine BLE A/AAROM: ankle pumps (tactile cues to initiate), heel slides, hip abduction x10 reps ea    General Comments General comments (skin integrity, edema, etc.): small amount of incontinence on bed noticed upon PTA arrival to room, NT notified he needs bed linen change when bathed. New bed pad placed and soiled pad removed during session. VSS per chart review, UTA VS during mobility 2/2 need to physically support pt while seated/standing.      Pertinent Vitals/Pain Pain Assessment Pain Assessment: Faces Faces Pain Scale: Hurts whole lot Pain Location: back, worst at L glute med/max Pain  Descriptors / Indicators: Discomfort, Grimacing, Guarding, Moaning, Cramping Pain Intervention(s): Limited activity within patient's tolerance, Monitored during session, Repositioned, Patient requesting pain meds-RN notified           PT Goals (current goals can now be found in the care plan section) Acute Rehab PT Goals Patient Stated Goal: less back pain PT Goal Formulation: With patient/family Time For Goal Achievement: 06/11/22 Progress towards PT goals: Not progressing toward goals - comment;PT to reassess next treatment (consider post-acute rehab pending plan; functional decline)    Frequency    Min 4X/week      PT Plan Current plan remains appropriate;Other (comment) (may need to consider post-acute if not progressing, pending medical/surgical plan)       AM-PAC PT "6 Clicks" Mobility   Outcome Measure  Help needed turning from your back to your side while in a flat bed without using bedrails?: A Little Help needed moving from lying on your back to sitting on the side of a flat bed without using bedrails?: A Lot Help needed moving to and from a bed to a chair (including a wheelchair)?: Total Help needed standing up from a chair using your arms (e.g., wheelchair or bedside chair)?: A Lot Help needed to walk in hospital room?: Total Help needed climbing 3-5 steps with a railing? : Total 6 Click Score: 10    End of Session Equipment Utilized During Treatment: Back brace  Activity Tolerance: Patient limited by pain;Treatment limited secondary to medical complications (Comment);Other (comment) (BLE buckling/weakness in stance) Patient left: in bed;with call bell/phone within reach;with bed alarm set;with nursing/sitter in room;Other (comment) (NT in room assisting with CHG wipes/bath) Nurse Communication: Mobility status;Patient requests pain meds;Other (comment) (limited mobility 2/2 pain. poor seated tolerance) PT Visit Diagnosis: Other abnormalities of gait and mobility  (R26.89);Muscle weakness (generalized) (M62.81)     Time: 1610-9604 PT Time Calculation (min) (ACUTE ONLY): 19 min  Charges:  $Therapeutic Activity: 8-22 mins                     Maricsa Sammons P., PTA Acute Rehabilitation Services Secure Chat Preferred 9a-5:30pm Office: [phone]    Dorathy Kinsman [facility] 05/30/2022, 11:24 AM

## 2022-05-30 NOTE — Progress Notes (Signed)
OT Cancellation Note  Patient Details Name: Shaurya Rawdon MRN: [id number] DOB: 23-Jul-1966   Cancelled Treatment:    Reason Eval/Treat Not Completed: Pain limiting ability to participate Attempted pt 2x this date. Morning attempt, pt was limited 2/2 pain. 2nd attempt made this afternoon and translator was unavailable. Pt noted to be writhing and moaning in pain. Will return as time allows and pt is appropriate.   [facility] OTR/L Acute Rehabilitation Services Office: [place] 05/30/2022, 3:00 PM

## 2022-05-30 NOTE — Plan of Care (Signed)

## 2022-05-31 LAB — CBC WITH DIFFERENTIAL/PLATELET
Abs Immature Granulocytes: 0.09 10*3/uL — ABNORMAL HIGH (ref 0.00–0.07)
Basophils Absolute: 0.1 10*3/uL (ref 0.0–0.1)
Basophils Relative: 1 %
Eosinophils Absolute: 0.2 10*3/uL (ref 0.0–0.5)
Eosinophils Relative: 2 %
HCT: 24.5 % — ABNORMAL LOW (ref 39.0–52.0)
Hemoglobin: 7.8 g/dL — ABNORMAL LOW (ref 13.0–17.0)
Immature Granulocytes: 1 %
Lymphocytes Relative: 11 %
Lymphs Abs: 1.4 10*3/uL (ref 0.7–4.0)
MCH: 23.1 pg — ABNORMAL LOW (ref 26.0–34.0)
MCHC: 31.8 g/dL (ref 30.0–36.0)
MCV: 72.7 fL — ABNORMAL LOW (ref 80.0–100.0)
Monocytes Absolute: 1.5 10*3/uL — ABNORMAL HIGH (ref 0.1–1.0)
Monocytes Relative: 12 %
Neutro Abs: 8.9 10*3/uL — ABNORMAL HIGH (ref 1.7–7.7)
Neutrophils Relative %: 73 %
Platelets: 337 10*3/uL (ref 150–400)
RBC: 3.37 MIL/uL — ABNORMAL LOW (ref 4.22–5.81)
RDW: 15.7 % — ABNORMAL HIGH (ref 11.5–15.5)
WBC: 12.2 10*3/uL — ABNORMAL HIGH (ref 4.0–10.5)
nRBC: 0 % (ref 0.0–0.2)

## 2022-05-31 LAB — COMPREHENSIVE METABOLIC PANEL
ALT: 8 U/L (ref 0–44)
AST: 14 U/L — ABNORMAL LOW (ref 15–41)
Albumin: 1.8 g/dL — ABNORMAL LOW (ref 3.5–5.0)
Alkaline Phosphatase: 70 U/L (ref 38–126)
Anion gap: 8 (ref 5–15)
BUN: 9 mg/dL (ref 6–20)
CO2: 26 mmol/L (ref 22–32)
Calcium: 8.5 mg/dL — ABNORMAL LOW (ref 8.9–10.3)
Chloride: 100 mmol/L (ref 98–111)
Creatinine, Ser: 0.99 mg/dL (ref 0.61–1.24)
GFR, Estimated: >60 mL/min (ref >60)
Glucose, Bld: 93 mg/dL (ref 70–99)
Potassium: 3.6 mmol/L (ref 3.5–5.1)
Sodium: 134 mmol/L — ABNORMAL LOW (ref 135–145)
Total Bilirubin: 0.4 mg/dL (ref 0.3–1.2)
Total Protein: 5.2 g/dL — ABNORMAL LOW (ref 6.5–8.1)

## 2022-05-31 LAB — GLUCOSE, CAPILLARY
Glucose-Capillary: 108 mg/dL — ABNORMAL HIGH (ref 70–99)
Glucose-Capillary: 165 mg/dL — ABNORMAL HIGH (ref 70–99)
Glucose-Capillary: 221 mg/dL — ABNORMAL HIGH (ref 70–99)
Glucose-Capillary: 132 mg/dL — ABNORMAL HIGH (ref 70–99)
Glucose-Capillary: 150 mg/dL — ABNORMAL HIGH (ref 70–99)

## 2022-05-31 LAB — PHOSPHORUS: Phosphorus: 4.2 mg/dL (ref 2.5–4.6)

## 2022-05-31 LAB — MAGNESIUM: Magnesium: 1.7 mg/dL (ref 1.7–2.4)

## 2022-05-31 NOTE — Progress Notes (Signed)
Progress Note Patient: Wayne Stokes NUU:[id number] DOB: 03-Jun-1966 DOA: 05/26/2022  DOS: the patient was seen and examined on 05/31/2022  Brief hospital course: Decklyn Mccarey is a [name] speaking 56 y.o. 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 Neurosurgery and orthopedics were consulted recommended TLSO brace and pain control. IR consulted for kyphoplasty.  Currently scheduled for early next week.  Assessment and Plan: Spinal stenosis of lumbar region with neurogenic claudication T12 compression fracture Severe back pain.  Unable to ambulate. MRI performed.  No cord or nerve root injury. Neurosurgery and orthopedics were consulted. Current recommendation is no surgical intervention.  TLSO brace for pain.  Consider kyphoplasty. IR was consulted. IR feels that the kyphoplasty is possible currently scheduled for next week. For now we will continue with pain management and await a kyphoplasty as the patient is unable to ambulate due to severe pain. Continue pain control.   AKI (acute kidney injury) (HCC) Acute urinary retention Likely from associated with pain and lack of mobility. On admission serum creatinine 1.42.  Baseline serum creatinine 1.1. Currently improving to normal. Avoid nephrotoxic medications. Hold NSAIDs, losartan HCTZ and metformin. Outpatient follow-up with urology as the patient failed voiding trial.  If the blood pressure allows will initiate Flomax.  Hyponatremia Mild. Monitor.   Type 2 diabetes mellitus without complication, without long-term current use of insulin  Hemoglobin A1c 7.4 in July. Currently holding oral hypoglycemic agent and continue sliding scale insulin. Blood sugars are  well controlled.  Leukocytosis Resolved.   Hypertension Blood pressure stable. Currently on amlodipine. Also on Toprol-XL losartan and HCTZ at home currently on hold.   Mild cognitive impairment Continue Donepezil.   Hyperlipidemia associated with type 2 diabetes mellitus (HCC) Continue Atorvastatin 40 mg po Daily    Hypomagnesemia Corrected.   Hypoalbuminemia Monitor for now.   Iron deficiency anemia and Microcytic Anemia Iron level 27. B12 911.  Folic acid 16.2. H&H relatively stable. Continue iron supplementation.  No evidence of bleeding.   Thrombocytosis Likely reactive. Monitor.  Subjective: Continues to have some pain.  No nausea no vomiting.  Pain still limiting his ability to ambulate.  Physical Exam: Vitals:   05/30/22 1647 05/30/22 1926 05/31/22 0353 05/31/22 0742  BP: 138/89 (!) 114/98 109/81 122/85  Pulse: (!) 109 100 96 88  Resp: 16 18 18 18   Temp: (!) 97.5 F (36.4 C)  99.6 F (37.6 C) 97.6 F (36.4 C)  TempSrc: Oral  Oral Oral  SpO2: 95% (!) 86% 98% 96%  Weight:       General: Appear in mild distress; no visible Abnormal Neck Mass Or lumps, Conjunctiva normal Cardiovascular: S1 and S2 Present, no Murmur, Respiratory: good respiratory effort, Bilateral Air entry present and CTA, no Crackles, no wheezes Abdomen: Bowel Sound present, Non tender  Extremities: no Pedal edema Neurology: alert and oriented to time, place, and person  Gait not checked due to patient safety concerns   Data Reviewed: I have Reviewed nursing notes, Vitals, and Lab results since pt's last encounter. Pertinent lab results CBC and BMP I have ordered test including CBC and BMP    Family Communication: No one at bedside  Disposition: Status is: Inpatient Remains inpatient appropriate because: Need for kyphoplasty.  Difficulty ambulation with severe pain. Author: , MD  05/31/2022 5:36 PM  Please look on [email or web address] to find out who is on call.

## 2022-05-31 NOTE — Evaluation (Signed)
Occupational Therapy Evaluation Patient Details Name: Wayne Stokes MRN: [id number] DOB: May 13, 1966 Today's Date: 05/31/2022   History of Present Illness 56 y.o. male presented 05/26/22 with c/o severe and progressively worsening low back pain that radiates into his left buttock and down his LLE. Imaging +acute T12 compression fracture and  L4/5 there is multifactorial severe spinal stenosis and severe bilateral foraminal stenosis. Neurosurgery consult with no acute surgical intervention needed. 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   Clinical Impression   Interpreter utilized throughout session Keo [phone]. Pt received in bed, incontinent of bowels requiring maxA for posterior care. Pt reports decreased awareness of BM for about 1 week, notified RN.  Pt required modA for donning back brace and modA for LB dressing. He requires minA+2 for sit<>stand and stand-pivot transfer from EOB to recliner, pt declined further mobility 2/2 pain. Currently recommend AIR to maximize progression to prior level of independence. Will continue to follow acutely and progress appropriately.      Recommendations for follow up therapy are one component of a multi-disciplinary discharge planning process, led by the attending physician.  Recommendations may be updated based on patient status, additional functional criteria and insurance authorization.   Follow Up Recommendations  Acute inpatient rehab (3hours/day) (pending progress and medical plan of care)    Assistance Recommended at Discharge Intermittent Supervision/Assistance  Patient can return home with the following A lot of help with walking and/or transfers;A lot of help with bathing/dressing/bathroom    Functional Status Assessment  Patient has had a recent decline in their functional status and demonstrates the ability to make significant  improvements in function in a reasonable and predictable amount of time.  Equipment Recommendations  BSC/3in1    Recommendations for Other Services       Precautions / Restrictions Precautions Precautions: Fall;Back Precaution Booklet Issued: No Precaution Comments: reviewed BLT back rule for pt comfort Required Braces or Orthoses: Spinal Brace Spinal Brace: Thoracolumbosacral orthotic;Applied in sitting position (when OOB, for comfort) Restrictions Weight Bearing Restrictions: No      Mobility Bed Mobility Overal bed mobility: Needs Assistance Bed Mobility: Rolling, Sidelying to Sit Rolling: Min assist Sidelying to sit: Mod assist       General bed mobility comments: minA with multimodal cues for rolling, modA to progress trunk to sidelying    Transfers Overall transfer level: Needs assistance Equipment used: Rolling walker (2 wheels) Transfers: Sit to/from Stand Sit to Stand: Mod assist, +2 physical assistance, +2 safety/equipment           General transfer comment: modA to powerup into standing max cues for safe hand placement. Pt noted to have BLE weakness, knees in slight flexion, no knee buckling noted      Balance Overall balance assessment: Needs assistance, History of Falls Sitting-balance support: Feet supported, No upper extremity supported Sitting balance-Leahy Scale: Fair     Standing balance support: Bilateral upper extremity supported, During functional activity, Reliant on assistive device for balance Standing balance-Leahy Scale: Poor Standing balance comment: heavy reliance on BUE support on RW                           ADL either performed or assessed with clinical judgement   ADL Overall ADL's : Needs assistance/impaired Eating/Feeding: Set up;Sitting Eating/Feeding Details (indicate cue type and reason): provided pt with lunch Grooming: Set up;Sitting   Upper Body Bathing:  Set up;Sitting   Lower Body Bathing: Moderate  assistance;Sit to/from stand   Upper Body Dressing : Moderate assistance Upper Body Dressing Details (indicate cue type and reason): modA to don back brace Lower Body Dressing: Moderate assistance Lower Body Dressing Details (indicate cue type and reason): assistance to don socks, educated pt on proper technique for adherence to precautions, pt appeared distracted Toilet Transfer: Minimal assistance;+2 for physical assistance;+2 for safety/equipment;Stand-pivot;Rolling walker (2 wheels) Toilet Transfer Details (indicate cue type and reason): simulated stand pivot transfer from EOB to recliner Toileting- Clothing Manipulation and Hygiene: Maximal assistance Toileting - Clothing Manipulation Details (indicate cue type and reason): pt incontinent of bowels, he reports he hasn't been able to feel if he went for about 1 week     Functional mobility during ADLs: Moderate assistance;Rolling walker (2 wheels) General ADL Comments: pt limited by pain, utilized Nurse, learning disability throughout session     Vision         Perception     Praxis      Pertinent Vitals/Pain Pain Assessment Pain Assessment: 0-10 Pain Score: 10-Worst pain ever Pain Location: back, worst at L glute med/max Pain Descriptors / Indicators: Discomfort, Grimacing, Guarding, Moaning, Cramping Pain Intervention(s): Limited activity within patient's tolerance, Monitored during session     Hand Dominance Right   Extremity/Trunk Assessment Upper Extremity Assessment Upper Extremity Assessment: Overall WFL for tasks assessed   Lower Extremity Assessment Lower Extremity Assessment: Defer to PT evaluation RLE: Unable to fully assess due to pain LLE: Unable to fully assess due to pain   Cervical / Trunk Assessment Cervical / Trunk Assessment: Other exceptions Cervical / Trunk Exceptions: back pain, guarding   Communication Communication Communication: Prefers language other than English;Interpreter utilized Threasa Beards, in-person  interpreter)   Cognition Arousal/Alertness: Awake/alert Behavior During Therapy: Restless, Impulsive Overall Cognitive Status: Within Functional Limits for tasks assessed                                 General Comments: documented history of MCI,pt continued to twist at lower back despite max education and cues to follow precautions     General Comments  incontinent of stool upon arrival    Exercises     Shoulder Instructions      Home Living Family/patient expects to be discharged to:: Private residence Living Arrangements: Non-relatives/Friends Available Help at Discharge: Friend(s);Available PRN/intermittently Type of Home: Apartment Home Access: Level entry     Home Layout: One level     Bathroom Shower/Tub: Chief Strategy Officer: Standard     Home Equipment: Agricultural consultant (2 wheels)          Prior Functioning/Environment Prior Level of Function : Independent/Modified Independent;History of Falls (last six months)             Mobility Comments: uses RW >1 year          OT Problem List: Decreased strength;Decreased range of motion;Decreased activity tolerance;Impaired balance (sitting and/or standing);Decreased safety awareness;Decreased knowledge of precautions;Pain      OT Treatment/Interventions: Self-care/ADL training;Therapeutic exercise;DME and/or AE instruction;Patient/family education    OT Goals(Current goals can be found in the care plan section) Acute Rehab OT Goals Patient Stated Goal: pt did not state OT Goal Formulation: With patient Time For Goal Achievement: 06/14/22 Potential to Achieve Goals: Good ADL Goals Pt Will Perform Grooming: with modified independence;standing Pt Will Perform Lower Body Dressing: with modified independence;sit to/from stand Pt Will  Transfer to Toilet: with modified independence;ambulating Additional ADL Goal #1: Pt will demonstrate independence with 3/3 back  precautions. Additional ADL Goal #2: Pt will demonstrate independence with wear schedule and donning/doffing back brace.  OT Frequency: Min 2X/week    Co-evaluation   Reason for Co-Treatment: Complexity of the patient's impairments (multi-system involvement);For patient/therapist safety;To address functional/ADL transfers PT goals addressed during session: Mobility/safety with mobility;Balance;Proper use of DME;Strengthening/ROM        AM-PAC OT "6 Clicks" Daily Activity     Outcome Measure Help from another person eating meals?: A Little Help from another person taking care of personal grooming?: A Little Help from another person toileting, which includes using toliet, bedpan, or urinal?: A Lot Help from another person bathing (including washing, rinsing, drying)?: A Lot Help from another person to put on and taking off regular upper body clothing?: A Little Help from another person to put on and taking off regular lower body clothing?: A Lot 6 Click Score: 15   End of Session Equipment Utilized During Treatment: Rolling walker (2 wheels);Back brace Nurse Communication: Mobility status  Activity Tolerance: Patient tolerated treatment well;Patient limited by pain Patient left: with call bell/phone within reach;in chair;with chair alarm set  OT Visit Diagnosis: Other abnormalities of gait and mobility (R26.89);Muscle weakness (generalized) (M62.81);Pain Pain - part of body:  (lower back and L hip/glute)                Time: 1130-1159 OT Time Calculation (min): 29 min Charges:  OT General Charges $OT Visit: 1 Visit OT Evaluation $OT Eval Moderate Complexity: 1 Mod  Enoc Getter OTR/L Acute Rehabilitation Services Office: [phone]   Rebeca Alert 05/31/2022, 12:59 PM

## 2022-05-31 NOTE — Progress Notes (Signed)
Physical Therapy Treatment Patient Details Name: Wayne Stokes MRN: [id number] DOB: January 01, 1966 Today's Date: 05/31/2022   History of Present Illness 56 y.o. male presented 05/26/22 with c/o severe and progressively worsening low back pain that radiates into his left buttock and down his LLE. Imaging +acute T12 compression fracture and  L4/5 there is multifactorial severe spinal stenosis and severe bilateral foraminal stenosis. Neurosurgery consult with no acute surgical intervention needed. 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    PT Comments    Pt is able to tolerate transfer training this session, however continues to report significant pain, declining ambulation attempts. PT notes increased knee flexion throughout all standing and transfers. With significant LLE pain and weakness the pt appears likely to be at risk for intermittent knee buckling. Pt will benefit from continued aggressive mobilization in an effort to improve activity tolerance and to reduce falls risk. PT updates recommendations to AIR at this time. Pt will need to demonstrate improved activity tolerance to become an ideal AIR candidate, however he is far from capable of mobilizing safely within the home at this time, and thus will benefit from inpatient therapies.  Recommendations for follow up therapy are one component of a multi-disciplinary discharge planning process, led by the attending physician.  Recommendations may be updated based on patient status, additional functional criteria and insurance authorization.  Follow Up Recommendations  Acute inpatient rehab (3hours/day)     Assistance Recommended at Discharge Intermittent Supervision/Assistance  Patient can return home with the following A lot of help with walking and/or transfers;A lot of help with bathing/dressing/bathroom;Assistance with  cooking/housework;Assist for transportation;Help with stairs or ramp for entrance   Equipment Recommendations  BSC/3in1    Recommendations for Other Services Rehab consult     Precautions / Restrictions Precautions Precautions: Fall;Back Precaution Booklet Issued: No Precaution Comments: reviewed BLT back rule for pt comfort Required Braces or Orthoses: Spinal Brace Spinal Brace: Thoracolumbosacral orthotic;Applied in sitting position Restrictions Weight Bearing Restrictions: No     Mobility  Bed Mobility Overal bed mobility: Needs Assistance Bed Mobility: Rolling, Sidelying to Sit Rolling: Min assist Sidelying to sit: Mod assist       General bed mobility comments: cues for technique and sequencing    Transfers Overall transfer level: Needs assistance Equipment used: Rolling walker (2 wheels) Transfers: Sit to/from Stand, Bed to chair/wheelchair/BSC Sit to Stand: Min assist, +2 physical assistance   Step pivot transfers: Min assist, +2 physical assistance       General transfer comment: increased knee flexion throughout standing and transfer, no overt buckling noted    Ambulation/Gait Ambulation/Gait assistance:  (deferred due to pain and LE weakness)                 Stairs             Wheelchair Mobility    Modified Rankin (Stroke Patients Only)       Balance Overall balance assessment: Needs assistance Sitting-balance support: No upper extremity supported, Feet supported Sitting balance-Leahy Scale: Fair     Standing balance support: Bilateral upper extremity supported, Reliant on assistive device for balance Standing balance-Leahy Scale: Poor Standing balance comment: minA with BUE support                            Cognition Arousal/Alertness: Awake/alert Behavior During Therapy: Restless, Impulsive Overall Cognitive Status: Within Functional Limits  for tasks assessed                                  General Comments: WFL based on interpretation        Exercises      General Comments General comments (skin integrity, edema, etc.): VSS on RA, pt incontinent of stool upon PT arrival, reports he has been unable to tell when he has a bowel movement for the last week      Pertinent Vitals/Pain Pain Assessment Pain Assessment: 0-10 Pain Score: 10-Worst pain ever Pain Location: back and LLE Pain Descriptors / Indicators: Sharp Pain Intervention(s): Patient requesting pain meds-RN notified    Home Living                          Prior Function            PT Goals (current goals can now be found in the care plan section) Acute Rehab PT Goals Patient Stated Goal: less back pain Progress towards PT goals: Not progressing toward goals - comment (limited by back pain)    Frequency    Min 4X/week      PT Plan Discharge plan needs to be updated    Co-evaluation PT/OT/SLP Co-Evaluation/Treatment: Yes Reason for Co-Treatment: Complexity of the patient's impairments (multi-system involvement);For patient/therapist safety;To address functional/ADL transfers PT goals addressed during session: Mobility/safety with mobility;Balance;Proper use of DME;Strengthening/ROM        AM-PAC PT "6 Clicks" Mobility   Outcome Measure  Help needed turning from your back to your side while in a flat bed without using bedrails?: A Little Help needed moving from lying on your back to sitting on the side of a flat bed without using bedrails?: A Little Help needed moving to and from a bed to a chair (including a wheelchair)?: A Lot Help needed standing up from a chair using your arms (e.g., wheelchair or bedside chair)?: A Lot Help needed to walk in hospital room?: Total Help needed climbing 3-5 steps with a railing? : Total 6 Click Score: 12    End of Session Equipment Utilized During Treatment: Back brace Activity Tolerance: Patient limited by pain Patient left: in chair;with  call bell/phone within reach;with chair alarm set Nurse Communication: Mobility status PT Visit Diagnosis: Other abnormalities of gait and mobility (R26.89);Muscle weakness (generalized) (M62.81)     Time: 4696-2952 PT Time Calculation (min) (ACUTE ONLY): 25 min  Charges:  $Therapeutic Activity: 8-22 mins                     Zenaida Niece, PT, DPT Acute Rehabilitation Office [phone]    Zenaida Niece 05/31/2022, 12:50 PM

## 2022-06-01 ENCOUNTER — Inpatient Hospital Stay (HOSPITAL_COMMUNITY): Payer: Medicaid Other

## 2022-06-01 DIAGNOSIS — M25511 Pain in right shoulder: Secondary | ICD-10-CM | POA: Clinically undetermined

## 2022-06-01 DIAGNOSIS — B370 Candidal stomatitis: Secondary | ICD-10-CM | POA: Clinically undetermined

## 2022-06-01 LAB — CBC WITH DIFFERENTIAL/PLATELET
Abs Immature Granulocytes: 0.15 10*3/uL — ABNORMAL HIGH (ref 0.00–0.07)
Basophils Absolute: 0 10*3/uL (ref 0.0–0.1)
Basophils Relative: 0 %
Eosinophils Absolute: 0.2 10*3/uL (ref 0.0–0.5)
Eosinophils Relative: 2 %
HCT: 26.2 % — ABNORMAL LOW (ref 39.0–52.0)
Hemoglobin: 8.4 g/dL — ABNORMAL LOW (ref 13.0–17.0)
Immature Granulocytes: 1 %
Lymphocytes Relative: 13 %
Lymphs Abs: 1.7 10*3/uL (ref 0.7–4.0)
MCH: 23.2 pg — ABNORMAL LOW (ref 26.0–34.0)
MCHC: 32.1 g/dL (ref 30.0–36.0)
MCV: 72.4 fL — ABNORMAL LOW (ref 80.0–100.0)
Monocytes Absolute: 1.3 10*3/uL — ABNORMAL HIGH (ref 0.1–1.0)
Monocytes Relative: 10 %
Neutro Abs: 9.3 10*3/uL — ABNORMAL HIGH (ref 1.7–7.7)
Neutrophils Relative %: 74 %
Platelets: 305 10*3/uL (ref 150–400)
RBC: 3.62 MIL/uL — ABNORMAL LOW (ref 4.22–5.81)
RDW: 15.7 % — ABNORMAL HIGH (ref 11.5–15.5)
WBC: 12.7 10*3/uL — ABNORMAL HIGH (ref 4.0–10.5)
nRBC: 0 % (ref 0.0–0.2)

## 2022-06-01 LAB — URINALYSIS, ROUTINE W REFLEX MICROSCOPIC
Bacteria, UA: NONE SEEN
Bilirubin Urine: NEGATIVE
Glucose, UA: NEGATIVE mg/dL
Hgb urine dipstick: NEGATIVE
Ketones, ur: NEGATIVE mg/dL
Leukocytes,Ua: NEGATIVE
Nitrite: NEGATIVE
Protein, ur: NEGATIVE mg/dL
Specific Gravity, Urine: 1.003 — ABNORMAL LOW (ref 1.005–1.030)
pH: 6 (ref 5.0–8.0)

## 2022-06-01 LAB — GLUCOSE, CAPILLARY
Glucose-Capillary: 111 mg/dL — ABNORMAL HIGH (ref 70–99)
Glucose-Capillary: 127 mg/dL — ABNORMAL HIGH (ref 70–99)
Glucose-Capillary: 168 mg/dL — ABNORMAL HIGH (ref 70–99)
Glucose-Capillary: 157 mg/dL — ABNORMAL HIGH (ref 70–99)

## 2022-06-01 LAB — TECHNOLOGIST SMEAR REVIEW: Plt Morphology: NORMAL

## 2022-06-01 LAB — PROCALCITONIN: Procalcitonin: 0.35 ng/mL

## 2022-06-01 LAB — C-REACTIVE PROTEIN: CRP: 19.7 mg/dL — ABNORMAL HIGH (ref <1.0)

## 2022-06-01 LAB — SEDIMENTATION RATE: Sed Rate: 90 mm/hr — ABNORMAL HIGH (ref 0–16)

## 2022-06-01 LAB — PROTIME-INR
INR: 1 (ref 0.8–1.2)
Prothrombin Time: 13.3 seconds (ref 11.4–15.2)

## 2022-06-01 LAB — RETICULOCYTES
Immature Retic Fract: 26.9 % — ABNORMAL HIGH (ref 2.3–15.9)
RBC.: 3.59 MIL/uL — ABNORMAL LOW (ref 4.22–5.81)
Retic Count, Absolute: 38.4 10*3/uL (ref 19.0–186.0)
Retic Ct Pct: 1.1 % (ref 0.4–3.1)

## 2022-06-01 LAB — SARS CORONAVIRUS 2 BY RT PCR: SARS Coronavirus 2 by RT PCR: NEGATIVE

## 2022-06-01 LAB — VITAMIN D 25 HYDROXY (VIT D DEFICIENCY, FRACTURES): Vit D, 25-Hydroxy: 27.04 ng/mL — ABNORMAL LOW (ref 30–100)

## 2022-06-01 MED ORDER — MORPHINE SULFATE (PF) 2 MG/ML IV SOLN
2.0000 mg | INTRAVENOUS | Status: DC | PRN
  Administered 2022-06-01 – 2022-06-06 (×15): 2 mg via INTRAVENOUS
  Filled 2022-06-01 (×17): qty 1

## 2022-06-01 MED ORDER — LACTATED RINGERS IV SOLN: INTRAVENOUS | Status: DC

## 2022-06-01 MED ORDER — VITAMIN D 25 MCG (1000 UNIT) PO TABS
1000.0000 [IU] | ORAL_TABLET | Freq: Every day | ORAL | Status: DC
  Administered 2022-06-01 – 2022-07-18 (×43): 1000 [IU] via ORAL
  Filled 2022-06-01 (×46): qty 1

## 2022-06-01 MED ORDER — NYSTATIN 100000 UNIT/ML MT SUSP
5.0000 mL | Freq: Four times a day (QID) | OROMUCOSAL | Status: DC
  Administered 2022-06-01 – 2022-06-08 (×27): 500000 [IU] via ORAL
  Filled 2022-06-01 (×30): qty 5

## 2022-06-01 MED ORDER — LACTULOSE 10 GM/15ML PO SOLN
20.0000 g | Freq: Two times a day (BID) | ORAL | Status: AC
  Administered 2022-06-01 (×2): 20 g via ORAL
  Filled 2022-06-01 (×2): qty 30

## 2022-06-01 NOTE — Progress Notes (Signed)
   06/01/22 1658  Assess: MEWS Score  Temp 98.8 F (37.1 C)  BP 123/86  MAP (mmHg) 97  Pulse Rate (!) 120  Resp 17  SpO2 100 %  O2 Device Room Air  Assess: MEWS Score  MEWS Temp 0  MEWS Systolic 0  MEWS Pulse 2  MEWS RR 0  MEWS LOC 0  MEWS Score 2  MEWS Score Color Yellow  Assess: if the MEWS score is Yellow or Red  Were vital signs taken at a resting state? Yes  Focused Assessment No change from prior assessment  Does the patient meet 2 or more of the SIRS criteria? Yes  Does the patient have a confirmed or suspected source of infection? Yes  Provider and Rapid Response Notified? Yes  MEWS guidelines implemented *See Row Information* Yes  Treat  MEWS Interventions Administered prn meds/treatments;Administered scheduled meds/treatments  Pain Scale 0-10  Pain Score 10  Pain Location Back  Patients Stated Pain Goal 0  Pain Intervention(s) Medication (See eMAR)  Take Vital Signs  Increase Vital Sign Frequency  Yellow: Q 2hr X 2 then Q 4hr X 2, if remains yellow, continue Q 4hrs  Document  Patient Outcome Stabilized after interventions  Assess: SIRS CRITERIA  SIRS Temperature  0  SIRS Pulse 1  SIRS Respirations  0  SIRS WBC 1  SIRS Score Sum  2

## 2022-06-01 NOTE — Progress Notes (Signed)
Inpatient Rehab Admissions Coordinator Note:   Per therapy patient was screened for CIR candidacy by Yexalen Deike Danford Bad, CCC-SLP. At this time, pt appears to be a potential candidate for CIR. I will place an order for rehab consult for full assessment, per our protocol.   Please contact me any with questions.Gayland Curry, [place], [facility] Admissions Coordinator [phone] 06/01/22 5:35 PM

## 2022-06-01 NOTE — Progress Notes (Signed)
   06/01/22 1002  Assess: MEWS Score  Temp (!) 102.2 F (39 C)  Assess: MEWS Score  MEWS Temp 2  MEWS Systolic 0  MEWS Pulse 0  MEWS RR 0  MEWS LOC 0  MEWS Score 2  MEWS Score Color Yellow  Assess: if the MEWS score is Yellow or Red  Were vital signs taken at a resting state? Yes  Focused Assessment Change from prior assessment (see assessment flowsheet)  Does the patient meet 2 or more of the SIRS criteria? Yes  Does the patient have a confirmed or suspected source of infection? Yes  Provider and Rapid Response Notified? Yes  MEWS guidelines implemented *See Row Information* Yes  Treat  MEWS Interventions Escalated (See documentation below)  Take Vital Signs  Increase Vital Sign Frequency  Yellow: Q 2hr X 2 then Q 4hr X 2, if remains yellow, continue Q 4hrs;Red: Q 1hr X 4 then Q 4hr X 4, if remains red, continue Q 4hrs  Escalate  MEWS: Escalate Yellow: discuss with charge nurse/RN and consider discussing with provider and RRT  Notify: Charge Nurse/RN  Name of Charge Nurse/RN Notified Mauricia Area, RN  Date Charge Nurse/RN Notified 06/01/22  Time Charge Nurse/RN Notified 1002  Notify: Provider  Provider Name/Title Berle Mull, MD  Date Provider Notified 06/01/22  Time Provider Notified 7  Method of Notification Face-to-face  Notification Reason Change in status  Provider response At bedside  Date of Provider Response 06/01/22  Time of Provider Response 1002  Document  Patient Outcome Transferred/level of care increased  Progress note created (see row info) Yes  Assess: SIRS CRITERIA  SIRS Temperature  1  SIRS Pulse 1  SIRS Respirations  0  SIRS WBC 1  SIRS Score Sum  3

## 2022-06-01 NOTE — Progress Notes (Signed)
Progress Note Patient: Wayne Stokes FUX:[id number] DOB: 27-Apr-1966 DOA: 05/26/2022  DOS: the patient was seen and examined on 06/01/2022  Brief hospital course: Talin Indelicato is a [name] speaking 56 y.o. 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 Neurosurgery and orthopedics were consulted recommended TLSO brace and pain control. IR consulted for kyphoplasty.  Currently scheduled for early next week. On 9/24 developed fever, leukocytosis requiring further work-up.  Transferred to telemetry for close observationDue to meeting SIRS criteria.  Assessment and Plan: Spinal stenosis of lumbar region with neurogenic claudication T12 compression fracture Presented with severe back pain with difficulty ambulation. MRI performed.  No cord or nerve root injury. Neurosurgery and orthopedics were consulted. Current recommendation is no surgical intervention.  TLSO brace for pain.  IR was consulted kyphoplasty Based on the recommendation. IR feels that the kyphoplasty is possible currently scheduled for 9/26 For now we will continue with pain management and await a kyphoplasty as the patient is unable to ambulate due to severe pain. Continue pain control.  SIRS. Concern for discitis. Developed leukocytosis on 9/23. Febrile on 9/24 with rectal temp of 1-2.2.  Tachycardic and tachypneic. Chest x-ray unremarkable.  X-ray abdomen unremarkable.  Show some constipation. Procalcitonin elevated 0.35. CRP 19.7.  ESR 90.  COVID-19 negative. We will check blood cultures. Check urine analysis. MRI performed initially on 9/18 shows reactive marrow edema at L4 level concerning for infection/osteomyelitis/discitis. Blood cultures  performed. Currently holding off on antibiotics. Initiate IV hydration. Transfer to telemetry.  AKI (acute kidney injury) ([place]) Acute urinary retention Likely from associated with pain and lack of mobility. On admission serum creatinine 1.42.  Baseline serum creatinine 1.1. Currently improving to normal. Avoid nephrotoxic medications. Hold NSAIDs, losartan HCTZ and metformin. Outpatient follow-up with urology as the patient failed voiding trial.  If the blood pressure allows will initiate Flomax.  Constipation/abdominal pain. We will increase bowel regimen.  Right shoulder pain. X-ray shoulder unremarkable for any acute abnormality.  Continue pain control.  Oral thrush. Noted on 9/24.  Initiate nystatin.  Vitamin D deficiency. Vitamin D level 27.04.  Initiate vitamin D supplementation.   Hyponatremia Mild. Monitor.   Type 2 diabetes mellitus without complication, without long-term current use of insulin  Hemoglobin A1c 7.4 in July. Currently holding oral hypoglycemic agent and continue sliding scale insulin. Blood sugars are well controlled.   Hypertension Blood pressure stable. Currently on amlodipine. Also on Toprol-XL losartan and HCTZ at home currently on hold.   Mild cognitive impairment Continue Donepezil.   Hyperlipidemia associated with type 2 diabetes mellitus (HCC) Continue Atorvastatin 40 mg po Daily    Hypomagnesemia Corrected.   Hypoalbuminemia Monitor for now.   Iron deficiency anemia and Microcytic Anemia Iron level 27. B12 911.  Folic acid 02.5. H&H relatively stable. Continue iron supplementation.  No evidence of bleeding.   Thrombocytosis Likely reactive. Monitor.   Subjective: Reports right shoulder pain.  No chest pain abdomen.  No headache. No dizziness.  Also appears having chills.  Requested RN to check rectal temp which was elevated.  Language barrier has been an issue for the patient.  Unable to reach the interpreters at the time  of my interview.  Physical Exam: Vitals:   06/01/22 1200 06/01/22 1536 06/01/22 1548 06/01/22 1658  BP:  Marland Kitchen)  135/93  123/86  Pulse:  (!) 107  (!) 120  Resp:  18  17  Temp: 99.9 F (37.7 C) 98.2 F (36.8 C) 100 F (37.8 C) 98.8 F (37.1 C)  TempSrc: Rectal Oral Rectal   SpO2:  94%  100%  Weight:       General: Appear in moderate distress; no visible Abnormal Neck Mass Or lumps, Conjunctiva normal Cardiovascular: S1 and S2 Present, no Murmur, Respiratory: increased respiratory effort, Bilateral Air entry present and CTA, no Crackles, no wheezes Abdomen: Bowel Sound present, mildly tender diffusely Extremities: no Pedal edema Neurology: alert and oriented to time, place, and person Gait not checked due to patient safety concerns   Data Reviewed: I have Reviewed nursing notes, Vitals, and Lab results since pt's last encounter. Pertinent lab results CBC and BMP I have ordered test including CBC, CMP, ESR, CRP, procalcitonin, vitamin D, reticulocyte count I have ordered imaging studies x-ray shoulder, x-ray abdomen, x-ray chest. I have independently visualized and interpreted imaging x-ray abdomen which showed constipation.   Family Communication: No one at bedside  Disposition: Status is: Inpatient Remains inpatient appropriate because: Need for further work-up for his SIRS.  Might require IV antibiotic.  Undergoing kyphoplasty.  Author: Berle Mull, MD 06/01/2022 5:22 PM  Please look on [email or web address] to find out who is on call.

## 2022-06-02 ENCOUNTER — Inpatient Hospital Stay (HOSPITAL_COMMUNITY): Payer: Medicaid Other

## 2022-06-02 DIAGNOSIS — M48062 Spinal stenosis, lumbar region with neurogenic claudication: Secondary | ICD-10-CM

## 2022-06-02 HISTORY — PX: IR LUMBAR DISC ASPIRATION W/IMG GUIDE: IMG5306

## 2022-06-02 LAB — CBC WITH DIFFERENTIAL/PLATELET
Abs Immature Granulocytes: 0.08 10*3/uL — ABNORMAL HIGH (ref 0.00–0.07)
Basophils Absolute: 0 10*3/uL (ref 0.0–0.1)
Basophils Relative: 0 %
Eosinophils Absolute: 0.2 10*3/uL (ref 0.0–0.5)
Eosinophils Relative: 2 %
HCT: 22.1 % — ABNORMAL LOW (ref 39.0–52.0)
Hemoglobin: 7.2 g/dL — ABNORMAL LOW (ref 13.0–17.0)
Immature Granulocytes: 1 %
Lymphocytes Relative: 9 %
Lymphs Abs: 1 10*3/uL (ref 0.7–4.0)
MCH: 23.1 pg — ABNORMAL LOW (ref 26.0–34.0)
MCHC: 32.6 g/dL (ref 30.0–36.0)
MCV: 70.8 fL — ABNORMAL LOW (ref 80.0–100.0)
Monocytes Absolute: 1.5 10*3/uL — ABNORMAL HIGH (ref 0.1–1.0)
Monocytes Relative: 14 %
Neutro Abs: 7.9 10*3/uL — ABNORMAL HIGH (ref 1.7–7.7)
Neutrophils Relative %: 74 %
Platelets: 255 10*3/uL (ref 150–400)
RBC: 3.12 MIL/uL — ABNORMAL LOW (ref 4.22–5.81)
RDW: 15.8 % — ABNORMAL HIGH (ref 11.5–15.5)
WBC: 10.6 10*3/uL — ABNORMAL HIGH (ref 4.0–10.5)
nRBC: 0 % (ref 0.0–0.2)

## 2022-06-02 LAB — HEPATIC FUNCTION PANEL
ALT: 7 U/L (ref 0–44)
AST: 16 U/L (ref 15–41)
Albumin: 1.5 g/dL — ABNORMAL LOW (ref 3.5–5.0)
Alkaline Phosphatase: 86 U/L (ref 38–126)
Bilirubin, Direct: <0.1 mg/dL (ref 0.0–0.2)
Total Bilirubin: 0.3 mg/dL (ref 0.3–1.2)
Total Protein: 4.8 g/dL — ABNORMAL LOW (ref 6.5–8.1)

## 2022-06-02 LAB — GLUCOSE, CAPILLARY
Glucose-Capillary: 103 mg/dL — ABNORMAL HIGH (ref 70–99)
Glucose-Capillary: 121 mg/dL — ABNORMAL HIGH (ref 70–99)
Glucose-Capillary: 94 mg/dL (ref 70–99)
Glucose-Capillary: 154 mg/dL — ABNORMAL HIGH (ref 70–99)

## 2022-06-02 LAB — MAGNESIUM: Magnesium: 1.3 mg/dL — ABNORMAL LOW (ref 1.7–2.4)

## 2022-06-02 LAB — BASIC METABOLIC PANEL
Anion gap: 8 (ref 5–15)
BUN: 8 mg/dL (ref 6–20)
CO2: 28 mmol/L (ref 22–32)
Calcium: 8.8 mg/dL — ABNORMAL LOW (ref 8.9–10.3)
Chloride: 98 mmol/L (ref 98–111)
Creatinine, Ser: 0.86 mg/dL (ref 0.61–1.24)
GFR, Estimated: >60 mL/min (ref >60)
Glucose, Bld: 128 mg/dL — ABNORMAL HIGH (ref 70–99)
Potassium: 3.3 mmol/L — ABNORMAL LOW (ref 3.5–5.1)
Sodium: 134 mmol/L — ABNORMAL LOW (ref 135–145)

## 2022-06-02 LAB — PROCALCITONIN: Procalcitonin: 0.47 ng/mL

## 2022-06-02 MED ORDER — MIDAZOLAM HCL 2 MG/2ML IJ SOLN
INTRAMUSCULAR | Status: AC
  Filled 2022-06-02: qty 2

## 2022-06-02 MED ORDER — SIMETHICONE 80 MG PO CHEW
80.0000 mg | CHEWABLE_TABLET | Freq: Four times a day (QID) | ORAL | Status: DC | PRN
  Administered 2022-06-02: 80 mg via ORAL
  Filled 2022-06-02 (×2): qty 1

## 2022-06-02 MED ORDER — VANCOMYCIN HCL 750 MG/150ML IV SOLN
750.0000 mg | Freq: Two times a day (BID) | INTRAVENOUS | Status: DC
  Administered 2022-06-03 – 2022-06-06 (×7): 750 mg via INTRAVENOUS
  Filled 2022-06-02 (×7): qty 150

## 2022-06-02 MED ORDER — POTASSIUM CHLORIDE CRYS ER 20 MEQ PO TBCR
40.0000 meq | EXTENDED_RELEASE_TABLET | ORAL | Status: AC
  Administered 2022-06-02 (×2): 40 meq via ORAL
  Filled 2022-06-02 (×2): qty 2

## 2022-06-02 MED ORDER — FENTANYL CITRATE (PF) 100 MCG/2ML IJ SOLN
INTRAMUSCULAR | Status: AC
  Filled 2022-06-02: qty 2

## 2022-06-02 MED ORDER — SODIUM CHLORIDE 0.9 % IV SOLN
2.0000 g | INTRAVENOUS | Status: DC
  Administered 2022-06-02 – 2022-06-05 (×4): 2 g via INTRAVENOUS
  Filled 2022-06-02 (×5): qty 20

## 2022-06-02 MED ORDER — METHOCARBAMOL 500 MG PO TABS
500.0000 mg | ORAL_TABLET | Freq: Three times a day (TID) | ORAL | Status: DC
  Administered 2022-06-02 – 2022-06-06 (×15): 500 mg via ORAL
  Filled 2022-06-02 (×16): qty 1

## 2022-06-02 MED ORDER — BUPIVACAINE HCL (PF) 0.5 % IJ SOLN
INTRAMUSCULAR | Status: AC
  Filled 2022-06-02: qty 30

## 2022-06-02 MED ORDER — LIDOCAINE HCL (PF) 1 % IJ SOLN
INTRAMUSCULAR | Status: AC
  Filled 2022-06-02: qty 30

## 2022-06-02 MED ORDER — FENTANYL CITRATE (PF) 100 MCG/2ML IJ SOLN
INTRAMUSCULAR | Status: AC | PRN
  Administered 2022-06-02 (×2): 25 ug via INTRAVENOUS

## 2022-06-02 MED ORDER — VANCOMYCIN HCL IN DEXTROSE 1-5 GM/200ML-% IV SOLN
1000.0000 mg | Freq: Once | INTRAVENOUS | Status: AC
  Administered 2022-06-02: 1000 mg via INTRAVENOUS
  Filled 2022-06-02 (×2): qty 200

## 2022-06-02 MED ORDER — ACETAMINOPHEN 500 MG PO TABS
500.0000 mg | ORAL_TABLET | Freq: Three times a day (TID) | ORAL | Status: DC
  Administered 2022-06-02 – 2022-06-06 (×15): 500 mg via ORAL
  Filled 2022-06-02 (×15): qty 1

## 2022-06-02 MED ORDER — MAGNESIUM SULFATE 4 GM/100ML IV SOLN
4.0000 g | Freq: Once | INTRAVENOUS | Status: AC
  Administered 2022-06-02: 4 g via INTRAVENOUS
  Filled 2022-06-02: qty 100

## 2022-06-02 MED ORDER — MIDAZOLAM HCL 2 MG/2ML IJ SOLN
INTRAMUSCULAR | Status: AC | PRN
  Administered 2022-06-02: 1 mg via INTRAVENOUS
  Administered 2022-06-02: .5 mg via INTRAVENOUS

## 2022-06-02 MED ORDER — LIDOCAINE HCL (PF) 1 % IJ SOLN
INTRAMUSCULAR | Status: AC | PRN
  Administered 2022-06-02: 10 mL

## 2022-06-02 MED ORDER — BUPIVACAINE HCL 0.25 % IJ SOLN
INTRAMUSCULAR | Status: AC | PRN
  Administered 2022-06-02: 10 mL

## 2022-06-02 NOTE — Progress Notes (Signed)
Pharmacy Antibiotic Note  Wayne Stokes is a 56 y.o. male admitted on 05/26/2022 with sepsis.  Pharmacy has been consulted for vancomycin dosing.  Plan: Vancomycin 1000 mg iv X1 f/b 750 mg IV every 12 hours.  eAUC: 468 mcg*hr/mL (utilized Scr 0.86 mg/dL) Monitor renal function, levels at steady-state, micro results, and opportunities to de-escalate  Height: 5' (152.4 cm) Weight: 61.3 kg (135 lb 2.3 oz) IBW/kg (Calculated) : 50  Temp (24hrs), Avg:98.9 F (37.2 C), Min:98.2 F (36.8 C), Max:99.9 F (37.7 C)  Recent Labs  Lab 05/28/22 0939 05/29/22 0349 05/30/22 0253 05/31/22 0808 06/01/22 0754 06/02/22 0104  WBC 8.2 8.1 9.2 12.2* 12.7* 10.6*  CREATININE 1.15 1.14 0.95 0.99  --  0.86    Estimated Creatinine Clearance: 73.1 mL/min (by C-G formula based on SCr of 0.86 mg/dL).    Allergies  Allergen Reactions   Beef-Derived Products Swelling   Pork-Derived Products Swelling   Shellfish Allergy Rash   Eggs Or Egg-Derived Products Swelling   Other Other (See Comments)    Egg plant , pizza - swelling    Antimicrobials this admission: 9/25 vanco >>   9/25 CTX >>    Microbiology results: 9/25- wound culture in process  Thank you for allowing pharmacy to be a part of this patient's care.  Vaughan Basta BS, PharmD, BCPS Clinical Pharmacist 06/02/2022 6:51 PM  Contact: [phone] after 3 PM  "Be curious, not judgmental..." -Jamal Maes

## 2022-06-02 NOTE — Progress Notes (Signed)
Progress Note Patient: Wayne Stokes TIR:[id number] DOB: Nov 13, 1965 DOA: 05/26/2022  DOS: the patient was seen and examined on 06/02/2022  Brief hospital course: Wayne Stokes is a [name] speaking 56 y.o. 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 Neurosurgery and orthopedics were consulted recommended TLSO brace and pain control. IR consulted for kyphoplasty.  Currently scheduled for early next week. On 9/24 developed fever, leukocytosis requiring further work-up.  Transferred to telemetry for close observationDue to meeting SIRS criteria. Assessment and Plan: Spinal stenosis of lumbar region with neurogenic claudication T12 compression fracture Presented with severe back pain with difficulty ambulation. MRI performed.  No cord or nerve root injury. Neurosurgery and orthopedics were consulted. Current recommendation is no surgical intervention.  TLSO brace for pain.  IR was consulted kyphoplasty Based on the recommendation. IR feels that the kyphoplasty is possible currently scheduled for 9/26 For now we will continue with pain management and await a kyphoplasty as the patient is unable to ambulate due to severe pain. Continue pain control.   SIRS.  Possible sepsis. Concern for discitis. Developed leukocytosis on 9/23. Febrile on 9/24 with rectal temp of 1-2.2.  Tachycardic and tachypneic. Chest x-ray unremarkable.  X-ray abdomen unremarkable.  Show some constipation. Procalcitonin elevated 0.35. CRP 19.7.  ESR 90.  COVID-19 negative. We will check blood cultures. Check urine analysis. MRI performed initially on 9/18 shows reactive marrow edema at L4 level concerning for infection/osteomyelitis/discitis. Blood cultures  performed. After biopsy for discitis I will initiate IV vancomycin and IV ceftriaxone. Initiate IV hydration. Continue to monitor on telemetry.   AKI (acute kidney injury) ([place]) Acute urinary retention Likely from associated with pain and lack of mobility. On admission serum creatinine 1.42.  Baseline serum creatinine 1.1. Currently improving to normal. Avoid nephrotoxic medications. Hold NSAIDs, losartan HCTZ and metformin. Outpatient follow-up with urology as the patient failed voiding trial.  If the blood pressure allows will initiate Flomax.   Constipation/abdominal pain. We will increase bowel regimen.  There is well.   Right shoulder pain. X-ray shoulder unremarkable for any acute abnormality.  Continue pain control.   Oral thrush. Noted on 9/24.  Initiate nystatin.   Vitamin D deficiency. Vitamin D level 27.04.  Initiate vitamin D supplementation.   Hyponatremia Mild. Monitor.   Type 2 diabetes mellitus without complication, without long-term current use of insulin  Hemoglobin A1c 7.4 in July. Currently holding oral hypoglycemic agent and continue sliding scale insulin. Blood sugars are well controlled.   Hypertension Blood pressure stable. Currently on amlodipine. Also on Toprol-XL losartan and HCTZ at home currently on hold.   Mild cognitive impairment Continue Donepezil.   Hyperlipidemia associated with type 2 diabetes mellitus (HCC) Continue Atorvastatin 40 mg po Daily    Hypomagnesemia Corrected.   Hypoalbuminemia Monitor for now.   Iron deficiency anemia and Microcytic Anemia Iron level 27. B12 911.  Folic acid 67.6. H&H relatively stable. Continue iron supplementation.  No evidence of bleeding.   Thrombocytosis Likely reactive. Monitor.   Sinus tachycardia. EKG performed on 9/24 showed sinus tachycardia likely in response to sepsis. Monitor closely.  Continue with IV fluids.  Subjective: Used interpreter.  Patient denies any new  complaints.  Continues to have complaint of right shoulder pain, some headache.  Lower back pain.  Also reports  pain on his left gluteal area.  Used to have some pain while urination but no pain right now.  No nausea no vomiting.  No abdominal pain.  Water intake adequate.  Tachycardic throughout the day.  Physical Exam: Vitals:   06/02/22 1415 06/02/22 1420 06/02/22 1435 06/02/22 1645  BP: (!) 138/105 (!) 108/94 (!) 122/96 (!) 140/89  Pulse:  (!) 108 (!) 104 99  Resp: _0 Temp:    99 F (37.2 C)  TempSrc:    Oral  SpO2: 100% 99% 93% 92%  Weight:      Height:       General: Appear in moderate distress; no visible Abnormal Neck Mass Or lumps, Conjunctiva normal Cardiovascular: S1 and S2 Present, no Murmur, Respiratory: good respiratory effort, Bilateral Air entry present and CTA, no Crackles, no wheezes Abdomen: Bowel Sound present, Non tender  Extremities: no Pedal edema Neurology: alert and oriented to time, place, and person  Gait not checked due to patient safety concerns   Data Reviewed: I have Reviewed nursing notes, Vitals, and Lab results since pt's last encounter. Pertinent lab results CBC and CMP I have ordered test including CBC and BMP and procalcitonin I have discussed pt's care plan and test results with IR.   Family Communication: No one at bedside  Disposition: Status is: Inpatient Remains inpatient appropriate because: Need for further work-up  Author: Berle Mull, MD 06/02/2022 6:35 PM  Please look on [email or web address] to find out who is on call.

## 2022-06-02 NOTE — Progress Notes (Signed)
Initial Nutrition Assessment  DOCUMENTATION CODES:   Not applicable  INTERVENTION:  -Add Glucerna q day.  Glucerna Shake supplement provides 220 kcal and 10 grams of protein    NUTRITION DIAGNOSIS:   Increased nutrient needs related to post-op healing as evidenced by other (comment) (Compression fracture).  GOAL:   Patient will meet greater than or equal to 90% of their needs  MONITOR:   PO intake  REASON FOR ASSESSMENT:   Consult Assessment of nutrition requirement/status  ASSESSMENT:   56 y.o. male admits related to evaluation of worsening lower back pain. PMH includes: T2DM, HTN, anemia, AKI. 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  Meds reviewed: Vit D3, ferrous sulfate, sliding scale insulin, miralax, senokot.  Labs reviewed.   Consult for assessment of nutritional goals. RD used interpreter to talk with pt. Pt reports that he has a good appetite and was eating well PTA. Pt also states he has been eating well since admission. He states that he is familiar with current diet and was following a heart healthy diabetic diet at home PTA. Pt denies having any nutrition -related questions at this time. RD will add Glucerna q day for extra protein in diet r/t compression fracture.   NUTRITION - FOCUSED PHYSICAL EXAM:  Flowsheet Row Most Recent Value  Orbital Region No depletion  Upper Arm Region No depletion  Thoracic and Lumbar Region No depletion  Buccal Region No depletion  Temple Region No depletion  Clavicle Bone Region No depletion  Clavicle and Acromion Bone Region No depletion  Scapular Bone Region No depletion  Dorsal Hand No depletion  Patellar Region No depletion  Anterior Thigh Region No depletion  Posterior Calf Region No depletion  Edema (RD Assessment) None  Hair Reviewed  Eyes Reviewed  Mouth Reviewed  Skin Reviewed  Nails Reviewed        Diet Order:   Diet Order             Diet heart healthy/carb modified Room service appropriate? Yes; Fluid consistency: Thin  Diet effective now                   EDUCATION NEEDS:   Education needs have been addressed  Skin:  Skin Assessment: Reviewed RN Assessment  Last BM:  06/02/22  Height:   Ht Readings from Last 1 Encounters:  06/01/22 5' (1.524 m)    Weight:   Wt Readings from Last 1 Encounters:  06/02/22 61.3 kg    Ideal Body Weight:  48.2 kg  BMI:  Body mass index is 26.39 kg/m.  Estimated Nutritional Needs:   Kcal:  1530-1840 kcal  Protein:  75-90 gm  Fluid:  1530-1840 mL  Thalia Bloodgood, RD, LDN, CNSC

## 2022-06-02 NOTE — Procedures (Signed)
INTERVENTIONAL NEURORADIOLOGY BRIEF POSTPROCEDURE NOTE  FLUOROSCOPY GUIDED L4-5 [place] ASPIRATION    Attending: Dr. 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   Complications: None    Estimated blood loss: None    Specimen: 1 cc of yellow fluid sent to lab in 2 syringes    Findings: L4-5 endplate erosion seen.  Fluoroscopy guided disc aspiration performed.  Sample sent for Gram stain and culture.    The patient tolerated the procedure well without incident or complication and is in stable condition.

## 2022-06-02 NOTE — Progress Notes (Signed)
Inpatient Rehab Admissions Coordinator:    CIR consult received. Will follow up following kyphoplasty 9/26 if functional deficits persist.   Clemens Catholic, [place], [place] Admissions Coordinator  [phone] (celll) [phone] (office)

## 2022-06-02 NOTE — Progress Notes (Signed)
   06/02/22 1645  Assess: MEWS Score  Temp 99 F (37.2 C)  BP (!) 140/89  MAP (mmHg) 105  Pulse Rate 99  Resp 16  SpO2 92 %  O2 Device Room Air  Assess: MEWS Score  MEWS Temp 0  MEWS Systolic 0  MEWS Pulse 0  MEWS RR 0  MEWS LOC 0  MEWS Score 0  MEWS Score Color Green  Assess: if the MEWS score is Yellow or Red  Were vital signs taken at a resting state? Yes  Focused Assessment No change from prior assessment  Does the patient meet 2 or more of the SIRS criteria? Yes  Does the patient have a confirmed or suspected source of infection? Yes  Provider and Rapid Response Notified? No  MEWS guidelines implemented *See Row Information* Yes  Treat  MEWS Interventions Administered scheduled meds/treatments  Pain Scale 0-10  Pain Score 2  Pain Type Chronic pain  Pain Location Back  Patients Stated Pain Goal 0  Pain Intervention(s) Repositioned  Take Vital Signs  Increase Vital Sign Frequency  Yellow: Q 2hr X 2 then Q 4hr X 2, if remains yellow, continue Q 4hrs  Document  Patient Outcome Stabilized after interventions  Assess: SIRS CRITERIA  SIRS Temperature  0  SIRS Pulse 1  SIRS Respirations  0  SIRS WBC 1  SIRS Score Sum  2

## 2022-06-02 NOTE — Progress Notes (Signed)
Chief Complaint: Patient was seen today for back pain  Supervising Physician: Pedro Earls  Patient Status: [facility] - In-pt  Subjective: Pt was on schedule for symptomatic T12 kyphoplasty today. However over past 24 hours or so, pt having fevers up to 102 and rising WBC. This is now concerning for infectious process. MRI reviewed again and lower lumbar area possibly concerning for discitis. NIR team asked to hold KP but instead perform disc aspiration. Telephone interpreter used to discuss  Objective: Physical Exam: BP (!) 121/94 (BP Location: Left Arm)   Pulse (!) 111   Temp 99.1 F (37.3 C) (Rectal)   Resp 16   Ht 5' (1.524 m)   Wt 135 lb 2.3 oz (61.3 kg)   SpO2 93%   BMI 26.39 kg/m  Lungs: CTA without w/r/r Heart: Regular     Current Facility-Administered Medications:    acetaminophen (TYLENOL) tablet 500 mg, 500 mg, Oral, TID, Lavina Hamman, MD, 500 mg at 06/02/22 0933   amLODipine (NORVASC) tablet 5 mg, 5 mg, Oral, Daily, Zada Finders R, MD, 5 mg at 06/02/22 0932   atorvastatin (LIPITOR) tablet 40 mg, 40 mg, Oral, Daily, Zada Finders R, MD, 40 mg at 06/02/22 0932   bisacodyl (DULCOLAX) suppository 10 mg, 10 mg, Rectal, Daily PRN, Sheikh, Omair Latif, DO   ceFAZolin (ANCEF) IVPB 2g/100 mL premix, 2 g, Intravenous, to XRAY, Boisseau, Hayley, PA   Chlorhexidine Gluconate Cloth 2 % PADS 6 each, 6 each, Topical, Daily, Danford, Suann Larry, MD, 6 each at 06/02/22 1047   cholecalciferol (VITAMIN D3) 25 MCG (1000 UNIT) tablet 1,000 Units, 1,000 Units, Oral, Daily, Lavina Hamman, MD, 1,000 Units at 06/02/22 0942   donepezil (ARICEPT) tablet 5 mg, 5 mg, Oral, QHS, Patel, Vishal R, MD, 5 mg at 06/01/22 2106   ferrous sulfate tablet 325 mg, 325 mg, Oral, Q breakfast, Danford, Suann Larry, MD, 325 mg at 06/02/22 0610   gabapentin (NEURONTIN) capsule 400 mg, 400 mg, Oral, TID, Sheikh, Omair Latif, DO, 400 mg at 06/02/22 0933   insulin aspart (novoLOG)  injection 0-9 Units, 0-9 Units, Subcutaneous, TID WC, Sheikh, Omair [place], DO, 2 Units at 06/01/22 1559   lactated ringers infusion, , Intravenous, Continuous, Lavina Hamman, MD, Last Rate: 75 mL/hr at 06/01/22 2359, Infusion Verify at 06/01/22 2359   lidocaine (LIDODERM) 5 % 1 patch, 1 patch, Transdermal, Q24H, Sheikh, [place], DO, 1 patch at 06/01/22 1354   methocarbamol (ROBAXIN) 500 mg in dextrose 5 % 50 mL IVPB, 500 mg, Intravenous, Q8H PRN, Sheikh, Omair Latif, DO, Stopped at 05/30/22 1726   methocarbamol (ROBAXIN) tablet 500 mg, 500 mg, Oral, TID, Lavina Hamman, MD, 500 mg at 06/02/22 0933   morphine (PF) 2 MG/ML injection 2 mg, 2 mg, Intravenous, Q2H PRN, Lavina Hamman, MD, 2 mg at 06/02/22 0825   Muscle Rub CREA 1 Application, 1 Application, Topical, PRN, Raiford Noble Latif, DO, 1 Application at 75/64/33 0401   nystatin (MYCOSTATIN) 100000 UNIT/ML suspension 500,000 Units, 5 mL, Oral, QID, Lavina Hamman, MD, 500,000 Units at 06/02/22 0933   ondansetron (ZOFRAN) tablet 4 mg, 4 mg, Oral, Q6H PRN **OR** ondansetron (ZOFRAN) injection 4 mg, 4 mg, Intravenous, Q6H PRN, Zada Finders R, MD, 4 mg at 06/01/22 2109   oxyCODONE (Oxy IR/ROXICODONE) immediate release tablet 10 mg, 10 mg, Oral, Q4H PRN, Sheikh, Omair Latif, DO, 10 mg at 06/02/22 1031   polyethylene glycol (MIRALAX / GLYCOLAX) packet 17 g, 17 g,  Oral, BID, Raiford Noble [place], [place], 17 g at 06/01/22 2106   senna-docusate (Senokot-S) tablet 1 tablet, 1 tablet, Oral, BID, Raiford Noble [place], [place], 1 tablet at 06/01/22 2106   simethicone (MYLICON) chewable tablet 80 mg, 80 mg, Oral, QID PRN, Opyd, Ilene Qua, MD, 80 mg at 06/02/22 0451  Labs: CBC Recent Labs    06/01/22 0754 06/02/22 0104  WBC 12.7* 10.6*  HGB 8.4* 7.2*  HCT 26.2* 22.1*  PLT 305 255   BMET Recent Labs    05/31/22 0808 06/02/22 0104  NA 134* 134*  K 3.6 3.3*  CL 100 98  CO2 26 28  GLUCOSE 93 128*  BUN 9 8  CREATININE 0.99 0.86  CALCIUM 8.5* 8.8*    LFT Recent Labs    06/02/22 0104  PROT 4.8*  ALBUMIN 1.5*  AST 16  ALT 7  ALKPHOS 86  BILITOT 0.3  BILIDIR <0.1  IBILI NOT CALCULATED   PT/INR Recent Labs    06/01/22 0754  LABPROT 13.3  INR 1.0     Studies/Results: DG CHEST PORT 1 VIEW  Result Date: 06/01/2022 CLINICAL DATA:  Fever. EXAM: PORTABLE CHEST 1 VIEW COMPARISON:  Chest x-ray 07/03/2014 FINDINGS: The heart is enlarged. No edema or effusion is present is cyst failure. No focal airspace disease is present. Asymmetric degenerative changes are present in the right shoulder. IMPRESSION: 1. Cardiomegaly without failure. 2. No acute cardiopulmonary disease. Electronically Signed   By: San Morelle M.D.   On: 06/01/2022 13:05   DG Shoulder Right Port  Result Date: 06/01/2022 CLINICAL DATA:  Right shoulder pain. EXAM: RIGHT SHOULDER - 1 VIEW COMPARISON:  CT of the chest 05/07/2022. Chest x-ray 07/03/2014 FINDINGS: Advanced degenerative changes are again seen in the right shoulder. Loss of joint space is present at the glenohumeral joint. Prominent spurring is present at the [facility] joint. No acute abnormalities are present. Clavicle is intact. Right hemithorax is clear. IMPRESSION: Advanced degenerative changes of the right shoulder. No acute abnormality. Electronically Signed   By: San Morelle M.D.   On: 06/01/2022 13:04   DG Abd Portable 1V  Result Date: 06/01/2022 CLINICAL DATA:  Abdominal pain. EXAM: PORTABLE ABDOMEN - 1 VIEW COMPARISON:  CT of the abdomen and pelvis 05/07/2022 FINDINGS: The bowel gas pattern is normal. No radio-opaque calculi are present. T12 compression fracture is similar the prior study. Axial skeleton is otherwise unremarkable. IMPRESSION: 1. No acute abnormality. 2. Stable T12 compression fracture. Electronically Signed   By: San Morelle M.D.   On: 06/01/2022 13:02    Assessment/Plan: Back pain Fever, elevated WBC MRI reviewed, it's possible changes in lower lumber region  could reflect discitis. Image guided disc aspiration can be offered to be sent for cultures to assist in infection workup. Procedure discussed with pt in detail. Consent obtained.    LOS: 7 days   I spent a total of 20 minutes in face to face in clinical consultation, greater than 50% of which was counseling/coordinating care for lumbar disc aspiration  Ascencion Dike PA-C 06/02/2022 12:42 PM

## 2022-06-03 LAB — BASIC METABOLIC PANEL
Anion gap: 6 (ref 5–15)
BUN: 7 mg/dL (ref 6–20)
CO2: 29 mmol/L (ref 22–32)
Calcium: 8.5 mg/dL — ABNORMAL LOW (ref 8.9–10.3)
Chloride: 96 mmol/L — ABNORMAL LOW (ref 98–111)
Creatinine, Ser: 0.86 mg/dL (ref 0.61–1.24)
GFR, Estimated: >60 mL/min (ref >60)
Glucose, Bld: 151 mg/dL — ABNORMAL HIGH (ref 70–99)
Potassium: 4.5 mmol/L (ref 3.5–5.1)
Sodium: 131 mmol/L — ABNORMAL LOW (ref 135–145)

## 2022-06-03 LAB — CBC WITH DIFFERENTIAL/PLATELET
Abs Immature Granulocytes: 0.03 10*3/uL (ref 0.00–0.07)
Basophils Absolute: 0.1 10*3/uL (ref 0.0–0.1)
Basophils Relative: 1 %
Eosinophils Absolute: 0.2 10*3/uL (ref 0.0–0.5)
Eosinophils Relative: 3 %
HCT: 23 % — ABNORMAL LOW (ref 39.0–52.0)
Hemoglobin: 7.3 g/dL — ABNORMAL LOW (ref 13.0–17.0)
Immature Granulocytes: 0 %
Lymphocytes Relative: 13 %
Lymphs Abs: 0.9 10*3/uL (ref 0.7–4.0)
MCH: 22.7 pg — ABNORMAL LOW (ref 26.0–34.0)
MCHC: 31.7 g/dL (ref 30.0–36.0)
MCV: 71.4 fL — ABNORMAL LOW (ref 80.0–100.0)
Monocytes Absolute: 1.1 10*3/uL — ABNORMAL HIGH (ref 0.1–1.0)
Monocytes Relative: 14 %
Neutro Abs: 5 10*3/uL (ref 1.7–7.7)
Neutrophils Relative %: 69 %
Platelets: 283 10*3/uL (ref 150–400)
RBC: 3.22 MIL/uL — ABNORMAL LOW (ref 4.22–5.81)
RDW: 15.9 % — ABNORMAL HIGH (ref 11.5–15.5)
WBC: 7.3 10*3/uL (ref 4.0–10.5)
nRBC: 0 % (ref 0.0–0.2)

## 2022-06-03 LAB — GLUCOSE, CAPILLARY
Glucose-Capillary: 108 mg/dL — ABNORMAL HIGH (ref 70–99)
Glucose-Capillary: 150 mg/dL — ABNORMAL HIGH (ref 70–99)
Glucose-Capillary: 153 mg/dL — ABNORMAL HIGH (ref 70–99)

## 2022-06-03 LAB — MAGNESIUM: Magnesium: 1.8 mg/dL (ref 1.7–2.4)

## 2022-06-03 LAB — PROCALCITONIN: Procalcitonin: 1.66 ng/mL

## 2022-06-03 MED ORDER — GLUCERNA SHAKE PO LIQD
237.0000 mL | ORAL | Status: DC
  Administered 2022-06-03 – 2022-06-10 (×6): 237 mL via ORAL
  Filled 2022-06-03: qty 237

## 2022-06-03 MED ORDER — TAMSULOSIN HCL 0.4 MG PO CAPS
0.4000 mg | ORAL_CAPSULE | Freq: Every day | ORAL | Status: DC
  Administered 2022-06-03 – 2022-07-06 (×32): 0.4 mg via ORAL
  Filled 2022-06-03 (×34): qty 1

## 2022-06-03 NOTE — Progress Notes (Shared)
   06/03/22 0432  Vitals  Temp 98.5 F (36.9 C)  Temp Source Axillary  BP (!) 140/93  MAP (mmHg) 108  BP Location Right Arm  BP Method Automatic  Patient Position (if appropriate) Lying  Pulse Rate (!) 114  Pulse Rate Source Monitor  Resp 14  Level of Consciousness  Level of Consciousness Alert  MEWS COLOR  MEWS Score Color Yellow  Oxygen Therapy  SpO2 94 %  O2 Device Nasal Cannula  O2 Flow Rate (L/min) 2 L/min  MEWS Score  MEWS Temp 0  MEWS Systolic 0  MEWS Pulse 2  MEWS RR 0  MEWS LOC 0  MEWS Score 2

## 2022-06-03 NOTE — Progress Notes (Signed)
Physical Therapy Treatment Patient Details Name: Wayne Stokes MRN: [id number] DOB: 1966-04-08 Today's Date: 06/03/2022   History of Present Illness 56 y.o. male presented 05/26/22 with c/o severe and progressively worsening low back pain that radiates into his left buttock and down his LLE. Imaging +acute T12 compression fracture and  L4/5 there is multifactorial severe spinal stenosis and severe bilateral foraminal stenosis. Neurosurgery consult with no acute surgical intervention needed. 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    PT Comments    Continuing work on functional mobility and activity tolerance;  Session focused on bed mobility and functional transfers, with specific goal of evaluating movement to get up and transfer OOB, and providing assistance while getting up to recliner to eat lunch; Today, pt reports significant pain R shoulder as well as L (R is worse), and this is effecting his ability to push up from sidelying to sit; Once up, able to maintain sitting balance for donning TLSO; Overall, pt's back pain seems improved over previous sessions -- while now bil shoulder pain is limiting his function;   During session, two representatives from [facility] [employer], Center for [facility], [place] and [employer], who are working with pt, visited; Will give their contact information to [facility] to see if their services can be helpful moving forward;   Noted disc aspiration cultures have revealed no growth so far; I'm curious if we need to further investigate source of fevers before considering kyphoplasty;   Pt is motivated and voices a desire to make progress; I'm hopeful that his decr back pain today perhaps indicates progress towards the compression fracture's healing, and represents a turn towards being able to better tolerate therapies, and keep the door  open for a potential AIR stay to maximize independence and safety with mobility and ADLs.  Threasa Beards, Medical Interpreter, was present and facilitated communication during session.  Recommendations for follow up therapy are one component of a multi-disciplinary discharge planning process, led by the attending physician.  Recommendations may be updated based on patient status, additional functional criteria and insurance authorization.  Follow Up Recommendations  Acute inpatient rehab (3hours/day)     Assistance Recommended at Discharge Intermittent Supervision/Assistance  Patient can return home with the following A lot of help with walking and/or transfers;A lot of help with bathing/dressing/bathroom;Assistance with cooking/housework;Assist for transportation;Help with stairs or ramp for entrance   Equipment Recommendations  BSC/3in1    Recommendations for Other Services Rehab consult     Precautions / Restrictions Precautions Precautions: Fall;Back Precaution Booklet Issued: No Precaution Comments: reviewed BLT back rule for pt comfort Required Braces or Orthoses: Spinal Brace Spinal Brace: Thoracolumbosacral orthotic;Applied in sitting position (when OOB, for comfort)     Mobility  Bed Mobility Overal bed mobility: Needs Assistance Bed Mobility: Rolling, Sidelying to Sit Rolling: Min assist Sidelying to sit: Mod assist       General bed mobility comments: minA with multimodal cues for rolling, modA to progress trunk to sidelying    Transfers Overall transfer level: Needs assistance Equipment used: Rolling walker (2 wheels) Transfers: Sit to/from Stand, Bed to chair/wheelchair/BSC Sit to Stand: Mod assist, +2 safety/equipment   Step pivot transfers: Mod assist, +2 physical assistance, +2 safety/equipment       General transfer comment: Notable grimace with shifting weight forward to stand; Big bend at hips to where his trunk is nearly horizontal to get weight off  of  hips, then pushes through UEs on RW to rise to almost upright standing; constant support to RUE; pivotal steps to chair and heavy mod assist to steady    Ambulation/Gait                   Stairs             Wheelchair Mobility    Modified Rankin (Stroke Patients Only)       Balance     Sitting balance-Leahy Scale: Fair       Standing balance-Leahy Scale: Poor Standing balance comment: heavy reliance on BUE support on RW                            Cognition Arousal/Alertness: Awake/alert Behavior During Therapy: WFL for tasks assessed/performed Overall Cognitive Status: Within Functional Limits for tasks assessed                                          Exercises      General Comments General comments (skin integrity, edema, etc.): Incontinent of stool upon arrival; assisted pt with peri-care, and noted redness; applied cream and ensured air cushion in seat of recliner      Pertinent Vitals/Pain Pain Assessment Pain Assessment: Faces Faces Pain Scale: Hurts whole lot Pain Location: R shoulder; some pain low back and L gluteal, but shoulder is worse this session; noted grimace as he was assisted with pericare Pain Descriptors / Indicators: Discomfort, Grimacing, Guarding Pain Intervention(s): Monitored during session, Repositioned, Other (comment) (obtained air cushion for seat)    Home Living                          Prior Function            PT Goals (current goals can now be found in the care plan section) Acute Rehab PT Goals Patient Stated Goal: less back pain PT Goal Formulation: With patient/family Time For Goal Achievement: 06/11/22 Potential to Achieve Goals: Good Progress towards PT goals: Progressing toward goals (Slowly; improvement compared to previous session)    Frequency    Min 4X/week      PT Plan Current plan remains appropriate    Co-evaluation              AM-PAC PT  "6 Clicks" Mobility   Outcome Measure  Help needed turning from your back to your side while in a flat bed without using bedrails?: A Little Help needed moving from lying on your back to sitting on the side of a flat bed without using bedrails?: A Lot Help needed moving to and from a bed to a chair (including a wheelchair)?: A Lot Help needed standing up from a chair using your arms (e.g., wheelchair or bedside chair)?: Total Help needed to walk in hospital room?: Total Help needed climbing 3-5 steps with a railing? : Total 6 Click Score: 10    End of Session Equipment Utilized During Treatment: Back brace Activity Tolerance: Patient limited by pain Patient left: in chair;with call bell/phone within reach;with chair alarm set Nurse Communication: Mobility status PT Visit Diagnosis: Other abnormalities of gait and mobility (R26.89);Muscle weakness (generalized) (M62.81)     Time: 4431-5400 PT Time Calculation (min) (ACUTE ONLY): 39 min  Charges:  $Therapeutic Activity: 38-52 mins  Van Clines, PT  Acute Rehabilitation Services Office [phone]    Levi Aland 06/03/2022, 6:09 PM

## 2022-06-03 NOTE — Progress Notes (Signed)
Inpatient Rehab Admissions Coordinator:  CIR following.for potential CIR admit. He currently continues to require IV morphine for pain and has not worked with therapy since 9/23. Additionally, I have not yet been able to identify a disposition for pt. As he lives with friends who are available only intermittently.   Clemens Catholic, [place], House Admissions Coordinator  [phone] ([place]) [phone] (office)

## 2022-06-03 NOTE — Progress Notes (Signed)
Progress Note Patient: Wayne Stokes FYB:[id number] DOB: 1965-11-22 DOA: 05/26/2022  DOS: the patient was seen and examined on 06/03/2022  Brief hospital course: Wayne Stokes is a [name] speaking 56 y.o. 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 Neurosurgery and orthopedics were consulted recommended TLSO brace and pain control. IR consulted for kyphoplasty.  Currently scheduled for early next week. On 9/24 developed fever, leukocytosis requiring further work-up.  Transferred to telemetry for close observation due to meeting SIRS criteria.   Assessment and Plan:  Spinal stenosis of lumbar region with neurogenic claudication T12 compression fracture - Presented with severe back pain with difficulty ambulation. - MRI spine performed.  No cord or nerve root injury. - Neurosurgery and orthopedics were consulted. - Current recommendation is no surgical intervention.  TLSO brace for pain.  - IR was consulted kyphoplasty -tentatively later this afternoon - 9/26 - For now we will continue with pain management and await a kyphoplasty as the patient is unable to ambulate due to severe pain. - Continue pain control.   SIRS, rule out sepsis, POA Rule out discitis Rule out septic arthritis - Developed leukocytosis on 9/23. Febrile on 9/24 with rectal temp of 102.2; concurrent tachycardia and tachypnea. - Chest x-ray unremarkable.  X-ray abdomen unremarkable other than mild constipation. - Procalcitonin elevated 0.35. CRP 19.7.  ESR 90.  COVID-19 negative. - Cultures pending - prelim negative - MRI spine performed initially on 9/18 shows reactive marrow edema at L4 level concerning for infection/osteomyelitis/discitis. -  Biopsy/aspiration 9/25 - IV vancomycin and IV ceftriaxone started after sample  Right shoulder pain, acutely worsening without trauma - X-ray shoulder unremarkable for any acute abnormality.  Continue pain control.  - MRI ordered to rule out septic arthritis given questionable source as above  - Of note patient indicating he is now having minimal left shoulder pain this afternoon as well -pending right shoulder imaging and clinical course patient may require further imaging of his left shoulder  AKI (acute kidney injury) ([place]) Acute urinary retention Likely from associated with pain and lack of mobility. On admission serum creatinine 1.42.  Baseline serum creatinine 1.1. Hold NSAIDs, losartan HCTZ and metformin. Outpatient follow-up with urology as the patient failed voiding trial.   Start Flomax   Constipation/abdominal pain. Continue bowel regimen   Oral thrush. Noted on 9/24.  Initiate nystatin.   Vitamin D deficiency. Vitamin D level 27.04.  Initiate vitamin D supplementation.   Hyponatremia Mild. Monitor.   Type 2 diabetes mellitus without complication, without long-term current use of insulin  - Hemoglobin A1c 7.4 in July. - Currently holding oral hypoglycemic agent and continue sliding scale insulin.   Hypertension - Blood pressure stable. - Currently on amlodipine. - Hold home metoprolol succ/losartan/hctz   Mild cognitive impairment Continue Donepezil.   Hyperlipidemia associated with type 2 diabetes mellitus (HCC) Continue Atorvastatin 40 mg po Daily    Hypomagnesemia Corrected.   Hypoalbuminemia Monitor for now.   Iron deficiency anemia and Microcytic Anemia Iron level 27. B12 911.  Folic acid 52.7. H&H relatively stable. Continue iron supplementation.  No evidence of bleeding.   Thrombocytosis Likely reactive. Monitor.   Sinus tachycardia. EKG performed on 9/24 showed sinus tachycardia likely in response to sepsis. Monitor closely.  Continue  with IV fluids.  Subjective:  Used phone interpreter, patient complains of profound right shoulder pain worsening, so much so that he gets distracted and forgets to answer questions via translator.  He indicates he has difficulty with even minimal range of motion.  Appears to be worse with active range of motion and improved with passive concerning for musculoskeletal versus septic origin, imaging pending.  Otherwise patient denies nausea vomiting diarrhea constipation headache fevers chills or chest pain  Physical Exam: Vitals:   06/02/22 1645 06/02/22 2207 06/03/22 0432 06/03/22 0637  BP: (!) 140/89 115/87 (!) 140/93 (!) 143/89  Pulse: 99 95 (!) 114 (!) 105  Resp: _0 Temp: 99 F (37.2 C) 99.1 F (37.3 C) 98.5 F (36.9 C)   TempSrc: Oral Tympanic Axillary   SpO2: 92% 95% 94%   Weight:      Height:       General: Appear in moderate distress; no visible Abnormal Neck Mass Or lumps, Conjunctiva normal Cardiovascular: S1 and S2 Present, no Murmur, Respiratory: good respiratory effort, Bilateral Air entry present and CTA, no Crackles, no wheezes Abdomen: Bowel Sound present, Non tender  Extremities: no Pedal edema, right shoulder range of motion profoundly limited, minimally improved with passive range of motion but still quite limited no overt crepitus, grinding, or catches noted on range of motion testing Neurology: alert and oriented to time, place, and person   Data Reviewed: I have Reviewed nursing notes, Vitals, and Lab results since pt's last encounter. Pertinent lab results CBC and CMP I have ordered test including CBC and BMP and procalcitonin I have discussed pt's care plan and test results with IR.   Family Communication: No one at bedside  Disposition: Status is: Inpatient Remains inpatient appropriate because: Need for further work-up  Author: Holli Humbles DO 06/03/2022 7:50 AM  Please see [email or web address] for night coverage (7p-7a)

## 2022-06-04 ENCOUNTER — Inpatient Hospital Stay (HOSPITAL_COMMUNITY): Payer: Medicaid Other

## 2022-06-04 LAB — CBC
HCT: 22.6 % — ABNORMAL LOW (ref 39.0–52.0)
Hemoglobin: 7.3 g/dL — ABNORMAL LOW (ref 13.0–17.0)
MCH: 23 pg — ABNORMAL LOW (ref 26.0–34.0)
MCHC: 32.3 g/dL (ref 30.0–36.0)
MCV: 71.3 fL — ABNORMAL LOW (ref 80.0–100.0)
Platelets: 296 10*3/uL (ref 150–400)
RBC: 3.17 MIL/uL — ABNORMAL LOW (ref 4.22–5.81)
RDW: 15.9 % — ABNORMAL HIGH (ref 11.5–15.5)
WBC: 8.8 10*3/uL (ref 4.0–10.5)
nRBC: 0 % (ref 0.0–0.2)

## 2022-06-04 LAB — CBC WITH DIFFERENTIAL/PLATELET
Abs Immature Granulocytes: 0.06 10*3/uL (ref 0.00–0.07)
Basophils Absolute: 0 10*3/uL (ref 0.0–0.1)
Basophils Relative: 1 %
Eosinophils Absolute: 0.3 10*3/uL (ref 0.0–0.5)
Eosinophils Relative: 4 %
HCT: 20.2 % — ABNORMAL LOW (ref 39.0–52.0)
Hemoglobin: 6.8 g/dL — CL (ref 13.0–17.0)
Immature Granulocytes: 1 %
Lymphocytes Relative: 15 %
Lymphs Abs: 1.2 10*3/uL (ref 0.7–4.0)
MCH: 23.5 pg — ABNORMAL LOW (ref 26.0–34.0)
MCHC: 33.7 g/dL (ref 30.0–36.0)
MCV: 69.9 fL — ABNORMAL LOW (ref 80.0–100.0)
Monocytes Absolute: 1.2 10*3/uL — ABNORMAL HIGH (ref 0.1–1.0)
Monocytes Relative: 15 %
Neutro Abs: 5.1 10*3/uL (ref 1.7–7.7)
Neutrophils Relative %: 64 %
Platelets: 248 10*3/uL (ref 150–400)
RBC: 2.89 MIL/uL — ABNORMAL LOW (ref 4.22–5.81)
RDW: 16 % — ABNORMAL HIGH (ref 11.5–15.5)
WBC: 7.8 10*3/uL (ref 4.0–10.5)
nRBC: 0 % (ref 0.0–0.2)

## 2022-06-04 LAB — MAGNESIUM: Magnesium: 1.4 mg/dL — ABNORMAL LOW (ref 1.7–2.4)

## 2022-06-04 LAB — GLUCOSE, CAPILLARY
Glucose-Capillary: 102 mg/dL — ABNORMAL HIGH (ref 70–99)
Glucose-Capillary: 104 mg/dL — ABNORMAL HIGH (ref 70–99)
Glucose-Capillary: 144 mg/dL — ABNORMAL HIGH (ref 70–99)
Glucose-Capillary: 162 mg/dL — ABNORMAL HIGH (ref 70–99)
Glucose-Capillary: 97 mg/dL (ref 70–99)

## 2022-06-04 LAB — BASIC METABOLIC PANEL
Anion gap: 10 (ref 5–15)
BUN: 8 mg/dL (ref 6–20)
CO2: 29 mmol/L (ref 22–32)
Calcium: 9.1 mg/dL (ref 8.9–10.3)
Chloride: 94 mmol/L — ABNORMAL LOW (ref 98–111)
Creatinine, Ser: 0.87 mg/dL (ref 0.61–1.24)
GFR, Estimated: >60 mL/min (ref >60)
Glucose, Bld: 107 mg/dL — ABNORMAL HIGH (ref 70–99)
Potassium: 3.5 mmol/L (ref 3.5–5.1)
Sodium: 133 mmol/L — ABNORMAL LOW (ref 135–145)

## 2022-06-04 LAB — ABO/RH: ABO/RH(D): O POS

## 2022-06-04 LAB — MRSA NEXT GEN BY PCR, NASAL: MRSA by PCR Next Gen: NOT DETECTED

## 2022-06-04 MED ORDER — ORAL CARE MOUTH RINSE: 15.0000 mL | OROMUCOSAL | Status: DC | PRN

## 2022-06-04 MED ORDER — MAGNESIUM SULFATE 2 GM/50ML IV SOLN
2.0000 g | Freq: Once | INTRAVENOUS | Status: AC
  Administered 2022-06-04: 2 g via INTRAVENOUS
  Filled 2022-06-04: qty 50

## 2022-06-04 MED ORDER — METOPROLOL SUCCINATE ER 25 MG PO TB24
12.5000 mg | ORAL_TABLET | Freq: Every day | ORAL | Status: DC
  Administered 2022-06-04 – 2022-06-30 (×27): 12.5 mg via ORAL
  Filled 2022-06-04 (×27): qty 1

## 2022-06-04 MED ORDER — GADOPICLENOL 0.5 MMOL/ML IV SOLN
7.5000 mL | Freq: Once | INTRAVENOUS | Status: AC | PRN
  Administered 2022-06-04: 6 mL via INTRAVENOUS

## 2022-06-04 NOTE — Progress Notes (Signed)
Inpatient Rehab Admissions Coordinator:    I spoke with Pt. With assistance of [place] interpretor, Karena Addison. Pt had just finished PT/OT and was having a lot of pain. Pt. Confirmed that he will have no one at home who can physically assist him. At this time, I do not think that pt. Could reasonably participate in 3 hours of therapy (due to pain related limitation) or achieve mod I goals at the end of a ~2 week CIR stay; however, if he progresses with therapy and pain is better managed, CIR may be an option. I will follow for progress and participation with therapies, but recommend TOC look toward other discharge plans in the meantime.   Clemens Catholic, [place], [place] Admissions Coordinator  [phone] ([place]) [phone] (office)

## 2022-06-04 NOTE — Progress Notes (Signed)
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  Orthopedics and neurosurgery have evaluated the patient and recommended TLSO brace and pain control.  IR was consulted for kyphoplasty.  On 9/24 the patient developed leukocytosis with a fever.  Consultants:  Interventional Radiology Orthopedics Neurosurgery  Goals of Care:  Code Status: Full Code   DVT prophylaxis: SCDs  Interim Hx: Afebrile since isolated event early morning 9/24.  WBC has normalized.  Culture data all negative thus far.  Sinus tachycardia persists.  Blood pressure stable.  MRI right shoulder however highly suspicious for septic arthritis.  Patient reports ongoing severe pain of the right shoulder as described to me via an interpreter.  Assessment & Plan:  Lumbar spinal stenosis -T12 compression fracture Presented with severe back pain and ambulatory dysfunction -MRI revealed no evidence of cord injury -no surgical intervention suggested by neurosurgery -TLSO brace for pain -IR was following for possible kyphoplasty but this was delayed when patient developed fever  FUO -SIRS -severe right shoulder pain 9/24 patient developed leukocytosis tachycardia and fever to 102.2 -CXR was unremarkable -COVID-negative -cultures negative -MRI of the spine 9/18 raised concern of infection/discitis at the L4 level -aspiration at this level completed 9/25 in IR with empiric IV vancomycin and ceftriaxone being dosed after sampling -culture from spinal aspiration thus far negative -ongoing pain in right shoulder prompted MRI which is reported as "highly suggestive of septic  arthritis" -orthopedics consulted for aspiration/culture of shoulder   Acute urinary retention with acute kidney injury Felt to be due to pain and lack of mobility -baseline creatinine 1.1 with creatinine 1.42 at presentation -ARB and NSAIDs on hold -failed voiding trial -Flomax initiated  Constipation Due to immobility -continue bowel regimen  Thrush Noted 9/24 -continue nystatin  Vitamin D deficiency Vitamin D level 27.04 -supplementation initiated  Hyponatremia Monitor trend  Hypomagnesemia Supplement and follow  DM 2 A1c 7.11 March 2022  HTN  Mild cognitive impairment Continue donepezil  HLD Continue atorvastatin  Microcytic iron deficient anemia Continue iron supplementation -no evidence of active bleeding   Family Communication: No family present at time of exam Disposition: Inpatient status to continue   Objective: Blood pressure 123/84, pulse (!) 115, temperature (!) 97.5 F (36.4 C), temperature source Oral, resp. rate 18, height 5' (1.524 m), weight 60 kg, SpO2 90 %.  Intake/Output Summary (Last 24 hours) at 06/04/2022 0941 Last data filed at 06/03/2022 1828 Gross per 24 hour  Intake 120 ml  Output 500 ml  Net -380 ml   Filed Weights   06/01/22 1911 06/02/22 0417 06/04/22 0315  Weight: 63.3 kg 61.3 kg 60 kg    Examination: General: No acute respiratory distress Lungs: Clear to auscultation bilaterally without wheezes or crackles Cardiovascular: Regular rate and rhythm without murmur gallop or rub normal S1 and S2 Abdomen: Nontender, nondistended, soft, bowel sounds positive, no rebound, no ascites, no appreciable mass Extremities: No significant cyanosis, clubbing, or edema bilateral lower extremities  CBC: Recent Labs  Lab 06/02/22 0104 06/03/22 0141 06/04/22 0542  WBC 10.6* 7.3 7.8  NEUTROABS 7.9* 5.0 5.1  HGB 7.2* 7.3* 6.8*  HCT 22.1* 23.0* 20.2*  MCV 70.8* 71.4* 69.9*  PLT 255 283 [id number]   Basic Metabolic Panel: Recent Labs  Lab  05/29/22 0349 05/30/22 0253 05/31/22 0808 06/02/22 0104 06/03/22 0141 06/04/22 0542  NA 136 134* 134* 134* 131* 133*  K 4.5 3.6 3.6 3.3* 4.5 3.5  CL 103 99 100 98 96* 94*  CO2 25 26 26 28 29 29   GLUCOSE 107* 151* 93 128* 151* 107*  BUN 15 10 9 8 7 8   CREATININE 1.14 0.95 0.99 0.86 0.86 0.87  CALCIUM 8.5* 8.6* 8.5* 8.8* 8.5* 9.1  MG 2.1 1.4* 1.7 1.3* 1.8 1.4*  PHOS 2.9 3.2 4.2  --   --   --    GFR: Estimated Creatinine Clearance: 71.6 mL/min (by C-G formula based on SCr of 0.87 mg/dL).   Scheduled Meds:  acetaminophen  500 mg Oral TID   amLODipine  5 mg Oral Daily   atorvastatin  40 mg Oral Daily   Chlorhexidine Gluconate Cloth  6 each Topical Daily   cholecalciferol  1,000 Units Oral Daily   donepezil  5 mg Oral QHS   feeding supplement (GLUCERNA SHAKE)  237 mL Oral Q24H   ferrous sulfate  325 mg Oral Q breakfast   gabapentin  400 mg Oral TID   insulin aspart  0-9 Units Subcutaneous TID WC   lidocaine  1 patch Transdermal Q24H   methocarbamol  500 mg Oral TID   nystatin  5 mL Oral QID   polyethylene glycol  17 g Oral BID   senna-docusate  1 tablet Oral BID   tamsulosin  0.4 mg Oral Daily   Continuous Infusions:  cefTRIAXone (ROCEPHIN)  IV 2 g (06/03/22 1733)   lactated ringers 75 mL/hr at 06/03/22 0315   methocarbamol (ROBAXIN) IV Stopped (05/30/22 1726)   vancomycin 750 mg (06/04/22 0549)     LOS: 9 days   Cherene Altes, MD [facility] Office  [phone] Pager - Text Page per Shea Evans  If 7PM-7AM, please contact night-coverage per Amion 06/04/2022, 9:41 AM

## 2022-06-04 NOTE — Progress Notes (Signed)
Inpatient Rehab Admissions Coordinator:    CIR following for potential admit; however, currently, he will require 24/7 care following d/c and no family or friends have said they can provide that. I stopped by Pt.'s room to discuss CIR and potential disposition this morning and Pt. Was not in his room. I will try again this PM.   Clemens Catholic, [place], [place] Admissions Coordinator  [phone] ([place]) [phone] (office)

## 2022-06-04 NOTE — Plan of Care (Signed)
  Problem: Education: Goal: Knowledge of General Education information will improve Description: Including pain rating scale, medication(s)/side effects and non-pharmacologic comfort measures Outcome: Progressing   Problem: Health Behavior/Discharge Planning: Goal: Ability to manage health-related needs will improve Outcome: Progressing   Problem: Clinical Measurements: Goal: Ability to maintain clinical measurements within normal limits will improve Outcome: Progressing Goal: Diagnostic test results will improve Outcome: Progressing   Problem: Clinical Measurements: Goal: Diagnostic test results will improve Outcome: Progressing   Problem: Activity: Goal: Risk for activity intolerance will decrease Outcome: Progressing   Problem: Activity: Goal: Risk for activity intolerance will decrease Outcome: Progressing   Problem: Coping: Goal: Level of anxiety will decrease Outcome: Progressing    Problem: Pain Managment: Goal: General experience of comfort will improve Outcome: Not Progressing   Problem: Safety: Goal: Ability to remain free from injury will improve Outcome: Progressing   Problem: Skin Integrity: Goal: Risk for impaired skin integrity will decrease Outcome: Progressing

## 2022-06-04 NOTE — Progress Notes (Signed)
Occupational Therapy Treatment Patient Details Name: Wayne Stokes MRN: [id number] DOB: 02/11/1966 Today's Date: 06/04/2022   History of present illness 56 y.o. male presented 05/26/22 with c/o severe and progressively worsening low back pain that radiates into his left buttock and down his LLE. Imaging +acute T12 compression fracture and  L4/5 there is multifactorial severe spinal stenosis and severe bilateral foraminal stenosis. Neurosurgery consult with no acute surgical intervention needed. 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   OT comments  Patient seated on EOB with PT upon arrival with mobility specialist and Geoffery Spruce interpretor. Patient demonstrated increased pain at RUE shoulder with complaints of BLE pain. Patient instructed on Stedy use and stood with mod assist +2 and patient stating difficulty using RUE.  Patient stood for peri area cleaning and transferred to recliner. Patient was positioned for comfort and indicated decreased pain at end of session. Acute OT to continue to follow.    Recommendations for follow up therapy are one component of a multi-disciplinary discharge planning process, led by the attending physician.  Recommendations may be updated based on patient status, additional functional criteria and insurance authorization.    Follow Up Recommendations  Acute inpatient rehab (3hours/day) (pending progress and medical plan of care)    Assistance Recommended at Discharge Intermittent Supervision/Assistance  Patient can return home with the following  A lot of help with walking and/or transfers;A lot of help with bathing/dressing/bathroom   Equipment Recommendations  BSC/3in1    Recommendations for Other Services      Precautions / Restrictions Precautions Precautions: Fall;Back Precaution Booklet Issued: No Precaution Comments: reviewed BLT back  rule for pt comfort Required Braces or Orthoses: Spinal Brace Spinal Brace: Thoracolumbosacral orthotic;Applied in sitting position (when OOB, for comfort) Restrictions Weight Bearing Restrictions: No       Mobility Bed Mobility Overal bed mobility: Needs Assistance             General bed mobility comments: on EOB with PT upon arrival    Transfers Overall transfer level: Needs assistance   Transfers: Sit to/from Stand, Bed to chair/wheelchair/BSC Sit to Stand: Mod assist, +2 safety/equipment           General transfer comment: performed transfer to recliner with [facility] for increased safety Transfer via Lift Equipment: Stedy   Balance Overall balance assessment: Needs assistance, History of Falls Sitting-balance support: Feet supported, No upper extremity supported Sitting balance-Leahy Scale: Fair     Standing balance support: Bilateral upper extremity supported, During functional activity, Reliant on assistive device for balance Standing balance-Leahy Scale: Poor Standing balance comment: reliant on Stedy for support                           ADL either performed or assessed with clinical judgement   ADL Overall ADL's : Needs assistance/impaired             Lower Body Bathing: Maximal assistance;Sit to/from stand Lower Body Bathing Details (indicate cue type and reason): to clean bottom while standing                       General ADL Comments: focused on transfer to recliner    Extremity/Trunk Assessment Upper Extremity Assessment Upper Extremity Assessment: RUE deficits/detail RUE Deficits / Details: right shoulder pain RUE Coordination: decreased fine motor;decreased gross motor  Vision       Perception     Praxis      Cognition Arousal/Alertness: Awake/alert Behavior During Therapy: WFL for tasks assessed/performed Overall Cognitive Status: Within Functional Limits for tasks assessed                                  General Comments: Patient seen with interpreter        Exercises      Shoulder Instructions       General Comments      Pertinent Vitals/ Pain       Pain Assessment Pain Assessment: Faces Faces Pain Scale: Hurts whole lot Pain Location: right shoulder, some low back, and LLE Pain Descriptors / Indicators: Discomfort, Grimacing, Guarding, Cramping Pain Intervention(s): Limited activity within patient's tolerance, Monitored during session, Repositioned, Premedicated before session  Home Living                                          Prior Functioning/Environment              Frequency  Min 2X/week        Progress Toward Goals  OT Goals(current goals can now be found in the care plan section)  Progress towards OT goals: Progressing toward goals  Acute Rehab OT Goals Patient Stated Goal: less pain OT Goal Formulation: With patient Time For Goal Achievement: 06/14/22 Potential to Achieve Goals: Good ADL Goals Pt Will Perform Grooming: with modified independence;standing Pt Will Perform Lower Body Dressing: with modified independence;sit to/from stand Pt Will Transfer to Toilet: with modified independence;ambulating Additional ADL Goal #1: Pt will demonstrate independence with 3/3 back precautions. Additional ADL Goal #2: Pt will demonstrate independence with wear schedule and donning/doffing back brace.  Plan Discharge plan remains appropriate    Co-evaluation                 AM-PAC OT "6 Clicks" Daily Activity     Outcome Measure   Help from another person eating meals?: A Little Help from another person taking care of personal grooming?: A Little Help from another person toileting, which includes using toliet, bedpan, or urinal?: A Lot Help from another person bathing (including washing, rinsing, drying)?: A Lot Help from another person to put on and taking off regular upper body clothing?: A  Little Help from another person to put on and taking off regular lower body clothing?: A Lot 6 Click Score: 15    End of Session Equipment Utilized During Treatment: Back brace;Other (comment) Charlaine Dalton)  OT Visit Diagnosis: Other abnormalities of gait and mobility (R26.89);Muscle weakness (generalized) (M62.81);Pain Pain - Right/Left: Right Pain - part of body: Shoulder   Activity Tolerance Patient limited by pain   Patient Left in chair;with call bell/phone within reach;with chair alarm set;with family/visitor present   Nurse Communication Mobility status        Time: 2992-4268 OT Time Calculation (min): 24 min  Charges: OT General Charges $OT Visit: 1 Visit OT Treatments $Therapeutic Activity: 23-37 mins  Lodema Hong, [place]  Office [place] 06/04/2022, 3:12 PM

## 2022-06-04 NOTE — Progress Notes (Signed)
Physical Therapy Treatment Patient Details Name: Wayne Stokes MRN: [id number] DOB: 1966/08/11 Today's Date: 06/04/2022   History of Present Illness 56 y.o. male presented 05/26/22 with c/o severe and progressively worsening low back pain that radiates into his left buttock and down his LLE. Imaging +acute T12 compression fracture and  L4/5 there is multifactorial severe spinal stenosis and severe bilateral foraminal stenosis. Neurosurgery consult with no acute surgical intervention needed. 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    PT Comments    Continuing work on functional mobility and activity tolerance;  Wayne Stokes, [place] interpretor, present and facilitated communication; REquired min assist to roll, and mod assist to elevate trunk sidelying to sit; Patient demonstrated increased pain at RUE shoulder with complaints of BLE pain. Patient instructed on Stedy use and stood with mod assist +2 and patient stating difficulty using RUE.  Patient stood for peri area cleaning and transferred to recliner. Patient was positioned for comfort and indicated decreased pain at end of session. Acute PT to continue to follow.    Recommendations for follow up therapy are one component of a multi-disciplinary discharge planning process, led by the attending physician.  Recommendations may be updated based on patient status, additional functional criteria and insurance authorization.  Follow Up Recommendations   (hopeful for good progress as pt progresses medically, and shoulder pain, back pain improve; Noted [facility] is watchful for progress; If [facility] is unable to take pt, he may be DTP)     Assistance Recommended at Discharge Intermittent Supervision/Assistance  Patient can return home with the following A lot of help with walking and/or transfers;A lot of help with bathing/dressing/bathroom;Assistance with  cooking/housework;Assist for transportation;Help with stairs or ramp for entrance   Equipment Recommendations  BSC/3in1    Recommendations for Other Services       Precautions / Restrictions Precautions Precautions: Fall;Back Precaution Booklet Issued: No Precaution Comments: reviewed BLT back rule for pt comfort Required Braces or Orthoses: Spinal Brace Spinal Brace: Thoracolumbosacral orthotic;Applied in sitting position (when OOB, for comfort) Restrictions Weight Bearing Restrictions: No     Mobility  Bed Mobility Overal bed mobility: Needs Assistance Bed Mobility: Rolling, Sidelying to Sit Rolling: Min assist Sidelying to sit: Mod assist       General bed mobility comments: minA with multimodal cues for rolling, modA to progress trunk to sidelying    Transfers Overall transfer level: Needs assistance   Transfers: Sit to/from Stand, Bed to chair/wheelchair/BSC Sit to Stand: Mod assist, +2 safety/equipment           General transfer comment: performed transfer to recliner with Charlaine Dalton for increased safety Transfer via Lift Equipment: Stedy  Ambulation/Gait                   Stairs             Wheelchair Mobility    Modified Rankin (Stroke Patients Only)       Balance Overall balance assessment: Needs assistance, History of Falls Sitting-balance support: Feet supported, No upper extremity supported Sitting balance-Leahy Scale: Fair     Standing balance support: Bilateral upper extremity supported, During functional activity, Reliant on assistive device for balance Standing balance-Leahy Scale: Poor Standing balance comment: reliant on Stedy for support                            Cognition Arousal/Alertness: Awake/alert  Behavior During Therapy: WFL for tasks assessed/performed Overall Cognitive Status: Within Functional Limits for tasks assessed                                 General Comments: Patient seen  with interpreter        Exercises      General Comments General comments (skin integrity, edema, etc.): Extra time for positioning once in the recliner due to pain and spasm posterior LLE, hamstring and calf      Pertinent Vitals/Pain Pain Assessment Pain Assessment: Faces Faces Pain Scale: Hurts whole lot Pain Location: right shoulder, some low back, and LLE Pain Descriptors / Indicators: Discomfort, Grimacing, Guarding, Spasm Pain Intervention(s): Limited activity within patient's tolerance    Home Living                          Prior Function            PT Goals (current goals can now be found in the care plan section) Acute Rehab PT Goals Patient Stated Goal: Agreeable to gettingup PT Goal Formulation: With patient/family Time For Goal Achievement: 06/11/22 Potential to Achieve Goals: Good Progress towards PT goals: Progressing toward goals (slowly)    Frequency    Min 4X/week      PT Plan Current plan remains appropriate    Co-evaluation PT/OT/SLP Co-Evaluation/Treatment: Yes Reason for Co-Treatment: Complexity of the patient's impairments (multi-system involvement);For patient/therapist safety;To address functional/ADL transfers PT goals addressed during session: Mobility/safety with mobility        AM-PAC PT "6 Clicks" Mobility   Outcome Measure  Help needed turning from your back to your side while in a flat bed without using bedrails?: A Little Help needed moving from lying on your back to sitting on the side of a flat bed without using bedrails?: A Lot Help needed moving to and from a bed to a chair (including a wheelchair)?: A Lot Help needed standing up from a chair using your arms (e.g., wheelchair or bedside chair)?: Total Help needed to walk in hospital room?: Total Help needed climbing 3-5 steps with a railing? : Total 6 Click Score: 10    End of Session Equipment Utilized During Treatment: Back brace Activity Tolerance:  Patient limited by pain Patient left: in chair;with call bell/phone within reach;with chair alarm set Nurse Communication: Mobility status PT Visit Diagnosis: Other abnormalities of gait and mobility (R26.89);Muscle weakness (generalized) (M62.81)     Time: 9147-8295 PT Time Calculation (min) (ACUTE ONLY): 42 min  Charges:  $Therapeutic Activity: 23-37 mins                     Van Clines, PT  Acute Rehabilitation Services Office [phone]    Levi Aland 06/04/2022, 6:09 PM

## 2022-06-04 NOTE — Progress Notes (Signed)
Mobility Specialist Progress Note   06/04/22 1530  Mobility  Activity Transferred from chair to bed  Level of Assistance +2 (takes two people)  Assistive Device Front wheel walker  Activity Response Tolerated fair  $Mobility charge 1 Mobility   Pt presenting w/ decreased pain tolerance but ready to get back in bed. Limited movement in RUE d/t pain but able to get in bed w/o fault. Left w/ call bell in reach and all needs met.  Holland Falling Mobility Specialist MS [facility] #:  [phone] Acute Rehab Office:  [phone]

## 2022-06-04 NOTE — Progress Notes (Signed)
@  [phone] call received from lab stating pt hemoglobin 6.8. @ 0652 on call provider was called via amnion.

## 2022-06-04 NOTE — Progress Notes (Signed)
Patient ID: Wayne Stokes, male   DOB: June 09, 1966, 56 y.o.   MRN: [id number] Requested aspiration if right shoulder done. Betadine prep sterile gloves , lidocaine wheel. posterior approach used 22 needle buried inside the joint.  Able to deflect of glenoid humeral head.  Multiple passes no fluid obtained.  I stuck the needle then in the subacromial space Betadine prep lidocaine skin wheal and again nothing in the subacromial space.  Call if I can be of any further assistance.  Myself [phone].  I am happy to follow him up in the office after discharge.  He does have lumbar spinal stenosis at L4-5.  Decompression of been discussed with patient however this was deferred when he had the T12 compression fracture.  He can follow-up with me in 2 weeks.

## 2022-06-05 ENCOUNTER — Inpatient Hospital Stay (HOSPITAL_COMMUNITY): Payer: Medicaid Other

## 2022-06-05 DIAGNOSIS — M009 Pyogenic arthritis, unspecified: Secondary | ICD-10-CM

## 2022-06-05 DIAGNOSIS — N179 Acute kidney failure, unspecified: Secondary | ICD-10-CM

## 2022-06-05 DIAGNOSIS — M4646 Discitis, unspecified, lumbar region: Secondary | ICD-10-CM

## 2022-06-05 DIAGNOSIS — D509 Iron deficiency anemia, unspecified: Secondary | ICD-10-CM

## 2022-06-05 DIAGNOSIS — R338 Other retention of urine: Secondary | ICD-10-CM

## 2022-06-05 DIAGNOSIS — E119 Type 2 diabetes mellitus without complications: Secondary | ICD-10-CM

## 2022-06-05 LAB — GLUCOSE, CAPILLARY
Glucose-Capillary: 102 mg/dL — ABNORMAL HIGH (ref 70–99)
Glucose-Capillary: 92 mg/dL (ref 70–99)
Glucose-Capillary: 188 mg/dL — ABNORMAL HIGH (ref 70–99)
Glucose-Capillary: 147 mg/dL — ABNORMAL HIGH (ref 70–99)

## 2022-06-05 LAB — CBC
HCT: 19.6 % — ABNORMAL LOW (ref 39.0–52.0)
Hemoglobin: 6.4 g/dL — CL (ref 13.0–17.0)
MCH: 22.8 pg — ABNORMAL LOW (ref 26.0–34.0)
MCHC: 32.7 g/dL (ref 30.0–36.0)
MCV: 69.8 fL — ABNORMAL LOW (ref 80.0–100.0)
Platelets: 308 10*3/uL (ref 150–400)
RBC: 2.81 MIL/uL — ABNORMAL LOW (ref 4.22–5.81)
RDW: 16.1 % — ABNORMAL HIGH (ref 11.5–15.5)
WBC: 8.5 10*3/uL (ref 4.0–10.5)
nRBC: 0 % (ref 0.0–0.2)

## 2022-06-05 LAB — MAGNESIUM: Magnesium: 1.6 mg/dL — ABNORMAL LOW (ref 1.7–2.4)

## 2022-06-05 LAB — COMPREHENSIVE METABOLIC PANEL
ALT: 6 U/L (ref 0–44)
AST: 14 U/L — ABNORMAL LOW (ref 15–41)
Albumin: <1.5 g/dL — ABNORMAL LOW (ref 3.5–5.0)
Alkaline Phosphatase: 118 U/L (ref 38–126)
Anion gap: 11 (ref 5–15)
BUN: 6 mg/dL (ref 6–20)
CO2: 29 mmol/L (ref 22–32)
Calcium: 9 mg/dL (ref 8.9–10.3)
Chloride: 92 mmol/L — ABNORMAL LOW (ref 98–111)
Creatinine, Ser: 0.91 mg/dL (ref 0.61–1.24)
GFR, Estimated: >60 mL/min (ref >60)
Glucose, Bld: 98 mg/dL (ref 70–99)
Potassium: 3.1 mmol/L — ABNORMAL LOW (ref 3.5–5.1)
Sodium: 132 mmol/L — ABNORMAL LOW (ref 135–145)
Total Bilirubin: 0.7 mg/dL (ref 0.3–1.2)
Total Protein: 4.8 g/dL — ABNORMAL LOW (ref 6.5–8.1)

## 2022-06-05 LAB — HEMOGLOBIN AND HEMATOCRIT, BLOOD
HCT: 25.5 % — ABNORMAL LOW (ref 39.0–52.0)
Hemoglobin: 8.4 g/dL — ABNORMAL LOW (ref 13.0–17.0)

## 2022-06-05 LAB — PREPARE RBC (CROSSMATCH)

## 2022-06-05 MED ORDER — POTASSIUM CHLORIDE CRYS ER 20 MEQ PO TBCR
40.0000 meq | EXTENDED_RELEASE_TABLET | Freq: Two times a day (BID) | ORAL | Status: DC
  Administered 2022-06-05 – 2022-06-06 (×3): 40 meq via ORAL
  Filled 2022-06-05 (×3): qty 2

## 2022-06-05 MED ORDER — SODIUM CHLORIDE 0.9% IV SOLUTION: Freq: Once | INTRAVENOUS | Status: AC

## 2022-06-05 MED ORDER — MAGNESIUM SULFATE 2 GM/50ML IV SOLN
2.0000 g | Freq: Once | INTRAVENOUS | Status: AC
  Administered 2022-06-05: 2 g via INTRAVENOUS
  Filled 2022-06-05: qty 50

## 2022-06-05 NOTE — Progress Notes (Signed)
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  Orthopedics and neurosurgery have evaluated the patient and recommended TLSO brace and pain control.  IR was consulted for kyphoplasty.  On 9/24 the patient developed leukocytosis with a fever.  Consultants:  Interventional Radiology Orthopedics Neurosurgery  Goals of Care:  Code Status: Full Code   DVT prophylaxis: SCDs  Interim Hx: Patient underwent right shoulder aspiration per orthopedics yesterday but no fluid was found.  Remains afebrile.  Sinus tachycardia persist.  Blood pressure stable.  Hemoglobin has dropped overnight and patient is being transfused 1 unit PRBC today.  Assessment & Plan:  Lumbar spinal stenosis L4-L5 - T12 compression fracture Presented with severe back pain and ambulatory dysfunction -MRI revealed no evidence of cord injury -no surgical intervention suggested by Neurosurgery -TLSO brace for pain -IR was following for possible T12 kyphoplasty but this was delayed when patient developed fever  FUO -SIRS -severe right shoulder pain 9/24 patient developed leukocytosis tachycardia and fever to 102.2 -CXR was unremarkable -COVID-negative -cultures negative -MRI of the spine 9/18 raised concern of infection/discitis at the L4 level - aspiration at this level completed 9/25 in IR with empiric IV vancomycin and ceftriaxone being dosed after sampling - culture from spinal aspiration thus far negative - ongoing pain in right shoulder prompted MRI which reported findings "highly suggestive of septic arthritis" - Orthopedics aspirated shoulder with no fluid  present - will ask ID to see in consultation   Acute urinary retention with acute kidney injury Felt to be due to pain and lack of mobility -baseline creatinine 1.1 with creatinine 1.42 at presentation -ARB and NSAIDs on hold -failed voiding trial -Flomax initiated -renal function has normalized  Constipation Due to immobility -continue bowel regimen  Thrush Noted 9/24 -continue nystatin  Vitamin D deficiency Vitamin D level 27.04 -supplementation initiated  Hyponatremia Sodium stable  Hypomagnesemia Persistent -continue supplementation  Hypokalemia Supplement  DM 2 A1c 7.11 March 2022 -CBG well controlled  HTN Blood pressure well controlled  Mild cognitive impairment Continue donepezil  HLD Continue atorvastatin  Microcytic iron deficient anemia Continue iron supplementation -no evidence of active bleeding -suspect this represents anemia of chronic inflammation/chronic disease -transfusing 1 unit PRBC today -continue to monitor hemoglobin trend -check iron studies   Family Communication: No family present at time of exam Disposition: Inpatient status to continue   Objective: Blood pressure 128/81, pulse (!) 111, temperature 98.3 F (36.8 C), temperature source Oral, resp. rate 16, height 5' (1.524 m), weight 60 kg, SpO2 91 %.  Intake/Output Summary (Last 24 hours) at 06/05/2022 0906 Last data filed at 06/05/2022 0400 Gross per 24 hour  Intake 676.37 ml  Output 1150 ml  Net -473.63 ml    Filed Weights   06/01/22 1911 06/02/22 0417 06/04/22 0315  Weight: 63.3 kg 61.3 kg 60 kg    Examination: General: No acute respiratory distress Lungs: Clear to auscultation bilaterally without wheezes or crackles Cardiovascular: Regular rate and rhythm without murmur \ Abdomen: Nontender, nondistended, soft, bowel sounds positive, no rebound Extremities: No significant edema bilateral lower extremities  CBC: Recent Labs  Lab 06/02/22 0104 06/03/22 0141 06/04/22 0542  06/04/22 1242 06/05/22  0338  WBC 10.6* 7.3 7.8 8.8 8.5  NEUTROABS 7.9* 5.0 5.1  --   --   HGB 7.2* 7.3* 6.8* 7.3* 6.4*  HCT 22.1* 23.0* 20.2* 22.6* 19.6*  MCV 70.8* 71.4* 69.9* 71.3* 69.8*  PLT 255 283 248 296 202    Basic Metabolic Panel: Recent Labs  Lab 05/30/22 0253 05/31/22 0808 06/02/22 0104 06/03/22 0141 06/04/22 0542 06/05/22 0338  NA 134* 134*   < > 131* 133* 132*  K 3.6 3.6   < > 4.5 3.5 3.1*  CL 99 100   < > 96* 94* 92*  CO2 26 26   < > 29 29 29   GLUCOSE 151* 93   < > 151* 107* 98  BUN 10 9   < > 7 8 6   CREATININE 0.95 0.99   < > 0.86 0.87 0.91  CALCIUM 8.6* 8.5*   < > 8.5* 9.1 9.0  MG 1.4* 1.7   < > 1.8 1.4* 1.6*  PHOS 3.2 4.2  --   --   --   --    < > = values in this interval not displayed.    GFR: Estimated Creatinine Clearance: 68.4 mL/min (by C-G formula based on SCr of 0.91 mg/dL).   Scheduled Meds:  sodium chloride   Intravenous Once   acetaminophen  500 mg Oral TID   atorvastatin  40 mg Oral Daily   Chlorhexidine Gluconate Cloth  6 each Topical Daily   cholecalciferol  1,000 Units Oral Daily   donepezil  5 mg Oral QHS   feeding supplement (GLUCERNA SHAKE)  237 mL Oral Q24H   ferrous sulfate  325 mg Oral Q breakfast   gabapentin  400 mg Oral TID   insulin aspart  0-9 Units Subcutaneous TID WC   lidocaine  1 patch Transdermal Q24H   methocarbamol  500 mg Oral TID   metoprolol succinate  12.5 mg Oral Daily   nystatin  5 mL Oral QID   polyethylene glycol  17 g Oral BID   senna-docusate  1 tablet Oral BID   tamsulosin  0.4 mg Oral Daily   Continuous Infusions:  cefTRIAXone (ROCEPHIN)  IV 2 g (06/04/22 1958)   lactated ringers 75 mL/hr at 06/03/22 0315   methocarbamol (ROBAXIN) IV Stopped (05/30/22 1726)   vancomycin 750 mg (06/05/22 0756)     LOS: 10 days   Cherene Altes, MD [facility] Office  [phone] Pager - Text Page per Shea Evans  If 7PM-7AM, please contact night-coverage per Amion 06/05/2022, 9:06 AM

## 2022-06-05 NOTE — Plan of Care (Signed)

## 2022-06-05 NOTE — Consult Note (Signed)
[place] for Infectious Disease    Date of Admission:  05/26/2022     Reason for Consult: Discitis/OM, septic arthritis     Referring Physician: Dr Thereasa Solo  Current antibiotics: Vancomycin 06/02/22 -- present Ceftriaxone 06/02/22 -- present  ASSESSMENT:    56 y.o. male admitted with:  # Concern for right shoulder septic arthritis # Possible L4-5 discitis Patient initially presented with worsening back pain in the setting of T12 compression fracture and degenerative changes at L4-5 with MRI raising question of discitis although thought to be less likely until patient developed isolated fever and mild leukocytosis on 06/01/22.  Status post disc space aspiration 06/02/22 with negative cultures thus far.  Blood cultures also negative to date.  Further confounding the scenario is he has developed worsening right shoulder pain this admission.  An MRI was done 06/04/22 which raised high suspicion for septic arthritis and a large joint effusion.  Orthopedics attempted an aspiration but were unsuccessful and patient remains on broad spectrum antibiotics without a causative organism identified.  # T12 compression fracture Was planning for kyphoplasty with IR until developed fever and infectious work up was undertaken earlier this week.   # Acute urinary retention # AKI Creatinine has improved following Foley placement after he initially failed a voiding trial earlier this admission.   # Protein calorie malnutrition Albumin is < 1.5.  # Anemia Requiring transfusion this morning.  # Diabetes A1c in July was 7.4.    RECOMMENDATIONS:    Continue vancomycin per pharmacy Continue ceftriaxone 2gm daily Follow cultures Lab monitoring, glycemic control, nutrition Will ask IR to see if they can successfully aspirate shoulder to send for cell count, crystals, and cultures Will follow   Principal Problem:   Spinal stenosis of lumbar region with neurogenic claudication Active  Problems:   Type 2 diabetes mellitus without complication, without long-term current use of insulin (HCC)   T12 compression fracture (HCC)   Hyponatremia   AKI (acute kidney injury) ([place])   Iron deficiency anemia   Hypertension associated with diabetes ([place])   Hyperlipidemia associated with type 2 diabetes mellitus ([place])   Mild cognitive impairment   Leukocytosis   Acute urinary retention   Thrush, oral   Right shoulder pain   MEDICATIONS:    Scheduled Meds:  acetaminophen  500 mg Oral TID   atorvastatin  40 mg Oral Daily   Chlorhexidine Gluconate Cloth  6 each Topical Daily   cholecalciferol  1,000 Units Oral Daily   donepezil  5 mg Oral QHS   feeding supplement (GLUCERNA SHAKE)  237 mL Oral Q24H   ferrous sulfate  325 mg Oral Q breakfast   gabapentin  400 mg Oral TID   insulin aspart  0-9 Units Subcutaneous TID WC   lidocaine  1 patch Transdermal Q24H   methocarbamol  500 mg Oral TID   metoprolol succinate  12.5 mg Oral Daily   nystatin  5 mL Oral QID   polyethylene glycol  17 g Oral BID   potassium chloride  40 mEq Oral BID   senna-docusate  1 tablet Oral BID   tamsulosin  0.4 mg Oral Daily   Continuous Infusions:  cefTRIAXone (ROCEPHIN)  IV 2 g (06/04/22 1958)   lactated ringers 75 mL/hr at 06/03/22 0315   magnesium sulfate bolus IVPB     methocarbamol (ROBAXIN) IV Stopped (05/30/22 1726)   vancomycin 750 mg (06/05/22 0756)   PRN Meds:.bisacodyl, methocarbamol (ROBAXIN) IV, morphine injection, Muscle Rub, ondansetron **OR** ondansetron (  ZOFRAN) IV, mouth rinse, oxyCODONE, simethicone  HPI:    Wayne Stokes is a 56 y.o. male with PMHx of diabetes, HTN, anemia, lumbar DDD with spinal stenosis and claudication, T12 vertebral compression fracture, and mild cognitive impairment admitted on 05/26/22 due to unrelenting back pain and difficulty with ambulation due to severe lumbar stenosis.  On admission, patient was afebrile and WBC was within normal range. An MRI was obtained on  05/26/22 notable for acute compression fracture involving the T12 vertebral body (previously noted also on CT scan 8/30) and advanced degenerative disc disease and facet arthrosis at L4-5 with resultant severe stenosis.  There was reactive edema at this level favored to be degenerative in nature.  However, changes of infection with discitis/OM was not excluded.  Antibiotics were held as infection felt to be unlikely.  Patient evaluated by orthopedics and NSGY whom recommended TSLO.  He has also seen orthopedics (Dr Lorin Mercy) as an outpatient on 05/13/22 regarding back pain and they were considering possible intstrumented fusion at L4-5 with decompression once thoracic fracture is healed.   Patient continued to have back pain while here so IR consulted for kyphoplasty at T12 which was planned for earlier this week.    However, patient developed new leukocytosis of 12.2 on 9/23 and a fever upwards of 102.2 on 9/24 raising new concerns for infection.  His kyphoplasty was postponed.  ID work up showed negative CXR, negative UA.  Blood cx negative, CRP 19, ESR 90.  COVID negative.  Antibiotics continued to be held and he had IR perform a disc space aspiration on 9/25.  Started on vanc/ceftriaxone following this procedure.  Cultures from this also are negative. MRI was reviewed again and could reflect discitis although very much uncertain.  Patient also developed shoulder pain.  This prompted an MRI of the shoulder 06/04/22 that was highly suspicious for septic arthritis and surrounding myofasciitis.  Dr Lorin Mercy from orthopedics saw patient and attempted aspiration.  However, there was no fluid able to be obtained.   He remains on antibiotics.  Patients hospitalization also complicated by urinary retention requiring Foley catheter placement.  He failed a voiding trial and this had to be replaced on 9/21 with plans for outpatient urology follow up.  He has had no central lines or other drains.  He did have a PIV in the left  AC placed 9/18 and then removed 9/23.  A new PIV was placed in the right wrist.    We are asked to see for antibiotic recommendations in regards to possible spinal infection and septic arthritis of the shoulder.    Past Medical History:  Diagnosis Date   Diabetes mellitus without complication ([place])    Hypertension     Social History   Tobacco Use   Smoking status: Never   Smokeless tobacco: Never  Vaping Use   Vaping Use: Never used  Substance Use Topics   Alcohol use: Never   Drug use: Never    Family History  Family history unknown: Yes    Allergies  Allergen Reactions   Beef-Derived Products Swelling   Pork-Derived Products Swelling   Shellfish Allergy Rash   Eggs Or Egg-Derived Products Swelling   Other Other (See Comments)    Egg plant , pizza - swelling    Review of Systems  All other systems reviewed and are negative.  Except as noted above.    OBJECTIVE:   Blood pressure 119/74, pulse (!) 114, temperature 97.6 F (36.4 C), temperature source Oral, resp.  rate 14, height 5' (1.524 m), weight 60 kg, SpO2 92 %. Body mass index is 25.83 kg/m.  Physical Exam Constitutional:      General: He is not in acute distress.    Appearance: Normal appearance.  HENT:     Head: Normocephalic and atraumatic.  Cardiovascular:     Rate and Rhythm: Normal rate and regular rhythm.  Pulmonary:     Effort: Pulmonary effort is normal. No respiratory distress.     Breath sounds: Normal breath sounds.  Abdominal:     General: There is no distension.     Palpations: Abdomen is soft.     Tenderness: There is no abdominal tenderness.  Genitourinary:    Comments: Foley in place.  Musculoskeletal:     Cervical back: Normal range of motion and neck supple.     Comments: Right shoulder significantly tender and decreased ROM.   Skin:    General: Skin is warm and dry.     Findings: No rash.  Neurological:     General: No focal deficit present.     Mental Status: He is  alert and oriented to person, place, and time.  Psychiatric:        Mood and Affect: Mood normal.        Behavior: Behavior normal.      Lab Results: Lab Results  Component Value Date   WBC 8.5 06/05/2022   HGB 6.4 (LL) 06/05/2022   HCT 19.6 (L) 06/05/2022   MCV 69.8 (L) 06/05/2022   PLT 308 06/05/2022    Lab Results  Component Value Date   NA 132 (L) 06/05/2022   K 3.1 (L) 06/05/2022   CO2 29 06/05/2022   GLUCOSE 98 06/05/2022   BUN 6 06/05/2022   CREATININE 0.91 06/05/2022   CALCIUM 9.0 06/05/2022   GFRNONAA >60 06/05/2022   GFRAA >90 07/03/2014    Lab Results  Component Value Date   ALT 6 06/05/2022   AST 14 (L) 06/05/2022   ALKPHOS 118 06/05/2022   BILITOT 0.7 06/05/2022       Component Value Date/Time   CRP 19.7 (H) 06/01/2022 1136       Component Value Date/Time   ESRSEDRATE 90 (H) 06/01/2022 1136    I have reviewed the micro and lab results in Epic.  Imaging: MR SHOULDER RIGHT W WO CONTRAST  Result Date: 06/04/2022 CLINICAL DATA:  Left shoulder pain and swelling. EXAM: MRI OF THE RIGHT SHOULDER WITHOUT AND WITH CONTRAST TECHNIQUE: Multiplanar, multisequence MR imaging of the right shoulder shoulder was performed before and after the administration of intravenous contrast. CONTRAST:  7.5 cc [employer]way COMPARISON:  Right shoulder radiographs 06/01/2022 FINDINGS: Examination is quite limited due to patient motion. Rotator cuff: Moderate rotator cuff tendinopathy/tendinosis without obvious full-thickness retracted tear. Muscles:  Moderate myofasciitis involving the shoulder musculature. Biceps long head: Severe tendinopathy involving the intra-articular portion of the biceps tendon which is markedly thickened and edematous. Possible septic tendinopathy. Acromioclavicular Joint: Moderate to advanced degenerative changes but no findings suspicious for septic arthritis. Glenohumeral Joint: Advanced degenerative changes with full-thickness cartilage loss, joint space  narrowing and osteophytic spurring. There is a large complex joint effusion with thick synovial enhancement after contrast administration. Findings are highly suspicious for septic arthritis with surrounding myofasciitis and probable septic subacromial/subdeltoid bursitis. Labrum:  Degenerated and torn. Bones:  No worrisome osseous enhancement to suggest osteomyelitis. Other: Significant subacromial/subdeltoid bursitis suspicious for septic bursitis. IMPRESSION: 1. MR findings highly suspicious for septic arthritis involving the glenohumeral joint  as detailed above. Suspect septic subacromial/subdeltoid bursitis and probable septic tendinopathy involving the rotator cuff tendons and the long head biceps tendon. 2. Surrounding myofasciitis without findings for pyomyositis. 3. No definite findings for osteomyelitis. These results will be called to the ordering clinician or representative by the Radiologist Assistant, and communication documented in the PACS or [employer]. Electronically Signed   By: Marijo Sanes M.D.   On: 06/04/2022 11:31     Imaging independently reviewed in Epic.  [facility] for Infectious [facility] [phone] pager 06/05/2022, 12:00 PM

## 2022-06-05 NOTE — Progress Notes (Addendum)
Date and time results received: 06/05/22 0450 (use smartphrase ".now" to insert current time)  Test: hgb Critical Value: 6.9  Name of Provider Notified: Gershon Cull Acknowledged and reviewing chart  No obvious signs of bleeding. Also made aware heartrate 100-109 and temp 99.7.  Attempted to discuss blood transfusion with pt without success.  @ 0545 call to interpreter services spoke with Bethena Roys, interpreter requested to perform blood consent. Bethena Roys called back at [phone] informing that interpreter would be available at 0645. At [phone] call from interpreter, all orientation questions answered correctly and explained and educated and consent signed. Oncoming shift to be notified.

## 2022-06-05 NOTE — Progress Notes (Signed)
PT Cancellation Note  Patient Details Name: Wayne Stokes MRN: [id number] DOB: 04-20-1966   Cancelled Treatment:    Reason Eval/Treat Not Completed: Medical issues which prohibited therapy Awaiting blood transfusion. Hgb 6.4 this AM. Will re-attempt as schedule allows.   Eleftherios Dudenhoeffer A. Gilford Rile PT, DPT Acute Rehabilitation Services Office [phone]    Wayne Stokes 06/05/2022, 8:13 AM

## 2022-06-06 ENCOUNTER — Inpatient Hospital Stay (HOSPITAL_COMMUNITY): Payer: Medicaid Other

## 2022-06-06 LAB — BASIC METABOLIC PANEL
Anion gap: 11 (ref 5–15)
BUN: <5 mg/dL — ABNORMAL LOW (ref 6–20)
CO2: 28 mmol/L (ref 22–32)
Calcium: 9.2 mg/dL (ref 8.9–10.3)
Chloride: 96 mmol/L — ABNORMAL LOW (ref 98–111)
Creatinine, Ser: 0.73 mg/dL (ref 0.61–1.24)
GFR, Estimated: >60 mL/min (ref >60)
Glucose, Bld: 105 mg/dL — ABNORMAL HIGH (ref 70–99)
Potassium: 4.1 mmol/L (ref 3.5–5.1)
Sodium: 135 mmol/L (ref 135–145)

## 2022-06-06 LAB — CULTURE, BLOOD (ROUTINE X 2)
Culture: NO GROWTH
Culture: NO GROWTH
Special Requests: ADEQUATE

## 2022-06-06 LAB — IRON AND TIBC
Iron: 13 ug/dL — ABNORMAL LOW (ref 45–182)
Saturation Ratios: 9 % — ABNORMAL LOW (ref 17.9–39.5)
TIBC: 147 ug/dL — ABNORMAL LOW (ref 250–450)
UIBC: 134 ug/dL

## 2022-06-06 LAB — SYNOVIAL CELL COUNT + DIFF, W/ CRYSTALS
Crystals, Fluid: NONE SEEN
Lymphocytes-Synovial Fld: 2 % (ref 0–20)
Monocyte-Macrophage-Synovial Fluid: 10 % — ABNORMAL LOW (ref 50–90)
Neutrophil, Synovial: 88 % — ABNORMAL HIGH (ref 0–25)
WBC, Synovial: 273 /mm3 — ABNORMAL HIGH (ref 0–200)

## 2022-06-06 LAB — TYPE AND SCREEN
ABO/RH(D): O POS
Antibody Screen: NEGATIVE
Unit division: 0

## 2022-06-06 LAB — RETICULOCYTES
Immature Retic Fract: 37 % — ABNORMAL HIGH (ref 2.3–15.9)
RBC.: 3.77 MIL/uL — ABNORMAL LOW (ref 4.22–5.81)
Retic Count, Absolute: 42.6 10*3/uL (ref 19.0–186.0)
Retic Ct Pct: 1.1 % (ref 0.4–3.1)

## 2022-06-06 LAB — GLUCOSE, CAPILLARY
Glucose-Capillary: 111 mg/dL — ABNORMAL HIGH (ref 70–99)
Glucose-Capillary: 112 mg/dL — ABNORMAL HIGH (ref 70–99)
Glucose-Capillary: 83 mg/dL (ref 70–99)
Glucose-Capillary: 90 mg/dL (ref 70–99)

## 2022-06-06 LAB — CBC
HCT: 27.8 % — ABNORMAL LOW (ref 39.0–52.0)
Hemoglobin: 9.2 g/dL — ABNORMAL LOW (ref 13.0–17.0)
MCH: 24.1 pg — ABNORMAL LOW (ref 26.0–34.0)
MCHC: 33.1 g/dL (ref 30.0–36.0)
MCV: 73 fL — ABNORMAL LOW (ref 80.0–100.0)
Platelets: 391 10*3/uL (ref 150–400)
RBC: 3.81 MIL/uL — ABNORMAL LOW (ref 4.22–5.81)
RDW: 17.4 % — ABNORMAL HIGH (ref 11.5–15.5)
WBC: 8.7 10*3/uL (ref 4.0–10.5)
nRBC: 0 % (ref 0.0–0.2)

## 2022-06-06 LAB — BPAM RBC
Blood Product Expiration Date: 202310202359
ISSUE DATE / TIME: 202309280945
Unit Type and Rh: 5100

## 2022-06-06 LAB — FOLATE: Folate: 15.1 ng/mL (ref >5.9)

## 2022-06-06 LAB — FERRITIN: Ferritin: 192 ng/mL (ref 24–336)

## 2022-06-06 LAB — VITAMIN B12: Vitamin B-12: 1576 pg/mL — ABNORMAL HIGH (ref 180–914)

## 2022-06-06 LAB — MAGNESIUM: Magnesium: 1.7 mg/dL (ref 1.7–2.4)

## 2022-06-06 MED ORDER — SODIUM CHLORIDE (PF) 0.9 % IJ SOLN
10.0000 mL | INTRAMUSCULAR | Status: DC | PRN
  Administered 2022-06-11: 10 mL

## 2022-06-06 MED ORDER — LIDOCAINE HCL (PF) 1 % IJ SOLN
5.0000 mL | Freq: Once | INTRAMUSCULAR | Status: AC
  Administered 2022-06-06: 5 mL via INTRADERMAL

## 2022-06-06 MED ORDER — POTASSIUM CHLORIDE CRYS ER 20 MEQ PO TBCR
40.0000 meq | EXTENDED_RELEASE_TABLET | Freq: Every day | ORAL | Status: DC
  Administered 2022-06-07 – 2022-06-12 (×6): 40 meq via ORAL
  Filled 2022-06-06 (×7): qty 2

## 2022-06-06 NOTE — Plan of Care (Signed)
  Problem: Education: Goal: Knowledge of General Education information will improve Description: Including pain rating scale, medication(s)/side effects and non-pharmacologic comfort measures Outcome: Progressing   Problem: Safety: Goal: Ability to remain free from injury will improve Outcome: Progressing   Problem: Skin Integrity: Goal: Risk for impaired skin integrity will decrease Outcome: Progressing   Problem: Pain Managment: Goal: General experience of comfort will improve Outcome: Not Progressing

## 2022-06-06 NOTE — Progress Notes (Signed)
Physical Therapy Treatment Patient Details Name: Wayne Stokes MRN: [id number] DOB: April 27, 1966 Today's Date: 06/06/2022   History of Present Illness 56 y.o. male presented 05/26/22 with c/o severe and progressively worsening low back pain that radiates into his left buttock and down his LLE. Imaging +acute T12 compression fracture and  L4/5 there is multifactorial severe spinal stenosis and severe bilateral foraminal stenosis. Neurosurgery consult with no acute surgical intervention needed. 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    PT Comments    Seen in conjunction with OT and interpreter [name]. RN premedicated patient prior to our arrival but continued to have increased pain in back and R LE at rest and with movement. Patient requiring modA+2 for sit to stand step pivot transfer to recliner with HHAx2. Unable to tolerate sitting in recliner x 5 minutes before requesting to return to bed.  Continue to recommend acute inpatient rehab (AIR) for post-acute therapy needs.    Recommendations for follow up therapy are one component of a multi-disciplinary discharge planning process, led by the attending physician.  Recommendations may be updated based on patient status, additional functional criteria and insurance authorization.  Follow Up Recommendations  Acute inpatient rehab (3hours/day)     Assistance Recommended at Discharge Intermittent Supervision/Assistance  Patient can return home with the following A lot of help with walking and/or transfers;A lot of help with bathing/dressing/bathroom;Assistance with cooking/housework;Assist for transportation;Help with stairs or ramp for entrance   Equipment Recommendations  BSC/3in1    Recommendations for Other Services       Precautions / Restrictions Precautions Precautions: Fall;Back Precaution Booklet Issued: No Precaution Comments:  reviewed BLT back rule for pt comfort Required Braces or Orthoses: Spinal Brace Spinal Brace: Thoracolumbosacral orthotic;Applied in sitting position (when OOB for comfort) Restrictions Weight Bearing Restrictions: No     Mobility  Bed Mobility Overal bed mobility: Needs Assistance Bed Mobility: Rolling, Sidelying to Sit, Sit to Sidelying Rolling: Mod assist Sidelying to sit: Mod assist, +2 for physical assistance     Sit to sidelying: Mod assist, +2 for physical assistance General bed mobility comments: patient with increased pain requiring assistance of 2 for sidelying to sit and sit to sidelying    Transfers Overall transfer level: Needs assistance Equipment used: None Transfers: Sit to/from Stand, Bed to chair/wheelchair/BSC Sit to Stand: Mod assist, +2 safety/equipment   Step pivot transfers: Mod assist, +2 physical assistance, +2 safety/equipment       General transfer comment: performed transfer from EOB to recliner with mod assist +2 hand held assist. Patient could not tolerate sitting long and returned to supine    Ambulation/Gait                   Stairs             Wheelchair Mobility    Modified Rankin (Stroke Patients Only)       Balance Overall balance assessment: Needs assistance, History of Falls Sitting-balance support: Feet supported, No upper extremity supported Sitting balance-Leahy Scale: Fair     Standing balance support: Bilateral upper extremity supported, During functional activity, Reliant on assistive device for balance Standing balance-Leahy Scale: Poor Standing balance comment: reliant on external support for balance                            Cognition Arousal/Alertness: Awake/alert Behavior During Therapy: WFL for tasks assessed/performed  Overall Cognitive Status: Within Functional Limits for tasks assessed                                 General Comments: Patient seen with interpreter,  [facility]        Exercises      General Comments        Pertinent Vitals/Pain Pain Assessment Pain Assessment: Faces Faces Pain Scale: Hurts whole lot Pain Location: some low back and RLE Pain Descriptors / Indicators: Discomfort, Grimacing, Guarding, Spasm Pain Intervention(s): Limited activity within patient's tolerance, Monitored during session, Repositioned, Premedicated before session    Home Living                          Prior Function            PT Goals (current goals can now be found in the care plan section) Acute Rehab PT Goals PT Goal Formulation: With patient/family Time For Goal Achievement: 06/11/22 Potential to Achieve Goals: Good Progress towards PT goals: Progressing toward goals    Frequency    Min 4X/week      PT Plan Current plan remains appropriate    Co-evaluation PT/OT/SLP Co-Evaluation/Treatment: Yes Reason for Co-Treatment: For patient/therapist safety;To address functional/ADL transfers PT goals addressed during session: Mobility/safety with mobility;Balance OT goals addressed during session: ADL's and self-care      AM-PAC PT "6 Clicks" Mobility   Outcome Measure  Help needed turning from your back to your side while in a flat bed without using bedrails?: A Little Help needed moving from lying on your back to sitting on the side of a flat bed without using bedrails?: A Lot Help needed moving to and from a bed to a chair (including a wheelchair)?: A Lot Help needed standing up from a chair using your arms (e.g., wheelchair or bedside chair)?: Total Help needed to walk in hospital room?: Total Help needed climbing 3-5 steps with a railing? : Total 6 Click Score: 10    End of Session   Activity Tolerance: Patient limited by pain Patient left: in bed;with call bell/phone within reach;with bed alarm set Nurse Communication: Mobility status PT Visit Diagnosis: Other abnormalities of gait and mobility (R26.89);Muscle  weakness (generalized) (M62.81)     Time: 0109-3235 PT Time Calculation (min) (ACUTE ONLY): 33 min  Charges:  $Therapeutic Activity: 8-22 mins                     Lakynn Halvorsen A. Dan Humphreys PT, DPT Acute Rehabilitation Services Office [phone]    Viviann Spare 06/06/2022, 4:17 PM

## 2022-06-06 NOTE — TOC Initial Note (Addendum)
Transition of Care [facility]) - Initial/Assessment Note    Patient Details  Name: Wayne Stokes MRN: [id number] Date of Birth: Feb 14, 1966  [facility]) CM/SW Contact:    Sharin Mons, RN Phone Number: 06/06/2022, 1:29 PM    Clinical Narrative:        Admitted with uncontrolled low back pain and ambulatory difficulty related to severe lumbar spinal stenosis and T12 compression fracture. NCM @ bedside with pt to discuss d/c planning. Pt speaks no [place]. Speaks [place]. [place] interpeter @ beside. NCM explained per IR admissions coordinator pt is not currently  at a level that he could participate  in the intensity of CIR. PTA pt lived in a 2 bedroom apartment with 55 y/o roommate who now is having health issues. Pt without family friend support. [place] interpreter states NCM could get more information concerning pt's hx and living needs from [name], [place] Nurse ,[phone]. NCM called and spoke with congregational nurse. Nurse confirmed pt without support. Rents a room in an apartment. States pt doesn't work. Worked in the past as a Theme park manager. Pt without income. States the day of admission pt was scheduled for a Medical evaluation for social security disability. Appointment not fulfilled 2/2 discomfort.  TOC team to work pt up for SNF placement  and discuss case with [facility] leadership for possible LOG  for SNF placement. FL-2 initiated with populated for MD signing closer to d/c readiness.  F.C following for Medicaid screening / SSD.  TOC team following and will assist with needs....  Expected Discharge Plan: Skilled Nursing Facility Barriers to Discharge: Continued Medical Work up   Patient Goals and CMS Choice        Expected Discharge Plan and Services Expected Discharge Plan: [place] In-house Referral: Development worker, community Discharge Planning Services: CM Consult                                          Prior Living  Arrangements/Services   Lives with:: Self Patient language and need for interpreter reviewed:: Yes        Need for Family Participation in Patient Care: Yes (Comment) Care giver support system in place?: No (comment)   Criminal Activity/Legal Involvement Pertinent to Current Situation/Hospitalization: No - Comment as needed  Activities of Daily Living Home Assistive Devices/Equipment: None ADL Screening (condition at time of admission) Patient's cognitive ability adequate to safely complete daily activities?: Yes Is the patient deaf or have difficulty hearing?: No Does the patient have difficulty seeing, even when wearing glasses/contacts?: No Does the patient have difficulty concentrating, remembering, or making decisions?: No Patient able to express need for assistance with ADLs?: No Does the patient have difficulty dressing or bathing?: No Independently performs ADLs?: Yes (appropriate for developmental age) Does the patient have difficulty walking or climbing stairs?: Yes Weakness of Legs: Both Weakness of Arms/Hands: None  Permission Sought/Granted                  Emotional Assessment Appearance:: Appears stated age     Orientation: : Oriented to Self, Oriented to Place, Oriented to  Time, Oriented to Situation Alcohol / Substance Use: Not Applicable Psych Involvement: No (comment)  Admission diagnosis:  Urinary retention [R33.9] Hyponatremia [E87.1] Spinal stenosis of lumbar region with neurogenic claudication [M48.062] Spinal stenosis at L4-L5 level [M48.061] Chronic low back pain with bilateral sciatica, unspecified back pain laterality [M54.41,  G89.29, M54.42] Patient Active Problem List   Diagnosis Date Noted   Pyogenic arthritis of right shoulder region [facility])    Discitis of lumbar region    Thrush, oral 06/01/2022   Right shoulder pain 06/01/2022   T12 compression fracture ([place]) 05/26/2022   Hyponatremia 05/26/2022   AKI (acute kidney injury) ([place])  05/26/2022   Iron deficiency anemia 05/26/2022   Hypertension associated with diabetes ([place]) 05/26/2022   Hyperlipidemia associated with type 2 diabetes mellitus ([place]) 05/26/2022   Mild cognitive impairment 05/26/2022   Leukocytosis 05/26/2022   Acute urinary retention 05/26/2022   Thoracic compression fracture ([place]) 05/13/2022   Spinal stenosis of lumbar region with neurogenic claudication 05/13/2022   Type 2 diabetes mellitus without complication, without long-term current use of insulin ([place]) 03/17/2022   PCP:  Teena Dunk, NP Pharmacy:   [facility] [address] [place] [address] Phone: [phone] Fax: [phone]     Social Determinants of Health (SDOH) Interventions    Readmission Risk Interventions     No data to display

## 2022-06-06 NOTE — Progress Notes (Signed)
[facility]  Date of Admission:  05/26/2022           Reason for visit: Follow up on Discitis/OM, septic arthritis                                     Current antibiotics: Vancomycin 06/02/22 -- present Ceftriaxone 06/02/22 -- present  ASSESSMENT:    56 y.o. male admitted with:   # Concern for right shoulder septic arthritis # Possible L4-5 discitis Patient initially presented with worsening back pain in the setting of T12 compression fracture and degenerative changes at L4-5 with MRI raising question of discitis although thought to be less likely until patient developed isolated fever and mild leukocytosis on 06/01/22.  Status post disc space aspiration 06/02/22 with negative cultures thus far.  Blood cultures also negative to date.  Further confounding the scenario is he has developed worsening right shoulder pain this admission.  An MRI was done 06/04/22 which raised high suspicion for septic arthritis and a large joint effusion.  Orthopedics attempted an aspiration but were unsuccessful and patient remains on broad spectrum antibiotics without a causative organism identified.  Now status post IR aspiration this morning.  Cultures and cell count are pending.   # T12 compression fracture Was planning for kyphoplasty with IR until developed fever and infectious work up was undertaken earlier this week.   # Acute urinary retention # AKI Creatinine has improved following Foley placement after he initially failed a voiding trial earlier this admission.    # Diabetes A1c in July was 7.4.  RECOMMENDATIONS:    Continue vancomycin and ceftriaxone Await cell count and cultures Lab monitoring, glycemic control, nutrition If aspiration today is concerning for septic arthritis, will likely need to reengage with orthopedic surgery regarding washout. Following   Principal Problem:   Pyogenic arthritis of right shoulder region [facility]) Active Problems:   Type 2  diabetes mellitus without complication, without long-term current use of insulin (HCC)   Spinal stenosis of lumbar region with neurogenic claudication   T12 compression fracture (HCC)   Hyponatremia   AKI (acute kidney injury) (HCC)   Iron deficiency anemia   Hypertension associated with diabetes (HCC)   Hyperlipidemia associated with type 2 diabetes mellitus (HCC)   Mild cognitive impairment   Leukocytosis   Acute urinary retention   Thrush, oral   Right shoulder pain   Discitis of lumbar region    MEDICATIONS:    Scheduled Meds:  acetaminophen  500 mg Oral TID   atorvastatin  40 mg Oral Daily   Chlorhexidine Gluconate Cloth  6 each Topical Daily   cholecalciferol  1,000 Units Oral Daily   donepezil  5 mg Oral QHS   feeding supplement (GLUCERNA SHAKE)  237 mL Oral Q24H   ferrous sulfate  325 mg Oral Q breakfast   gabapentin  400 mg Oral TID   insulin aspart  0-9 Units Subcutaneous TID WC   lidocaine  1 patch Transdermal Q24H   lidocaine (PF)  5 mL Intradermal Once   methocarbamol  500 mg Oral TID   metoprolol succinate  12.5 mg Oral Daily   nystatin  5 mL Oral QID   polyethylene glycol  17 g Oral BID   potassium chloride  40 mEq Oral BID   senna-docusate  1 tablet Oral BID   tamsulosin  0.4 mg Oral Daily  Continuous Infusions:  cefTRIAXone (ROCEPHIN)  IV 2 g (06/05/22 2022)   lactated ringers 75 mL/hr at 06/03/22 0315   methocarbamol (ROBAXIN) IV Stopped (05/30/22 1726)   vancomycin 750 mg (06/06/22 0542)   PRN Meds:.bisacodyl, methocarbamol (ROBAXIN) IV, morphine injection, Muscle Rub, ondansetron **OR** ondansetron (ZOFRAN) IV, mouth rinse, oxyCODONE, simethicone  SUBJECTIVE:   24 hour events:  No acute events noted overnight Discussed with IR who will attempt aspiration of the shoulder today Patient continues on vancomycin and ceftriaxone Afebrile, Tmax 98.9 Lab work this morning stable Cultures remain negative from the blood and back  aspiration   Patient underwent shoulder aspiration today with IR.  He continues to have shoulder pain.  He is asking for his bed to be lowered.  Review of Systems  All other systems reviewed and are negative.     OBJECTIVE:   Blood pressure 129/76, pulse (!) 106, temperature 98 F (36.7 C), temperature source Oral, resp. rate 14, height 5' (1.524 m), weight 62.8 kg, SpO2 100 %. Body mass index is 27.04 kg/m.  Physical Exam Constitutional:      Appearance: Normal appearance.  Eyes:     Extraocular Movements: Extraocular movements intact.     Conjunctiva/sclera: Conjunctivae normal.  Pulmonary:     Effort: Pulmonary effort is normal. No respiratory distress.  Abdominal:     General: There is no distension.     Palpations: Abdomen is soft.  Musculoskeletal:     Cervical back: Normal range of motion and neck supple.     Comments: Patient has tenderness and pain with range of motion of his right shoulder.  Skin:    General: Skin is warm and dry.  Neurological:     General: No focal deficit present.     Mental Status: He is alert. Mental status is at baseline.  Psychiatric:        Mood and Affect: Mood normal.        Behavior: Behavior normal.      Lab Results: Lab Results  Component Value Date   WBC 8.7 06/06/2022   HGB 9.2 (L) 06/06/2022   HCT 27.8 (L) 06/06/2022   MCV 73.0 (L) 06/06/2022   PLT 391 06/06/2022    Lab Results  Component Value Date   NA 135 06/06/2022   K 4.1 06/06/2022   CO2 28 06/06/2022   GLUCOSE 105 (H) 06/06/2022   BUN <5 (L) 06/06/2022   CREATININE 0.73 06/06/2022   CALCIUM 9.2 06/06/2022   GFRNONAA >60 06/06/2022   GFRAA >90 07/03/2014    Lab Results  Component Value Date   ALT 6 06/05/2022   AST 14 (L) 06/05/2022   ALKPHOS 118 06/05/2022   BILITOT 0.7 06/05/2022       Component Value Date/Time   CRP 19.7 (H) 06/01/2022 1136       Component Value Date/Time   ESRSEDRATE 90 (H) 06/01/2022 1136     I have reviewed the  micro and lab results in Epic.  Imaging: MR SHOULDER RIGHT W WO CONTRAST  Result Date: 06/04/2022 CLINICAL DATA:  Left shoulder pain and swelling. EXAM: MRI OF THE RIGHT SHOULDER WITHOUT AND WITH CONTRAST TECHNIQUE: Multiplanar, multisequence MR imaging of the right shoulder shoulder was performed before and after the administration of intravenous contrast. CONTRAST:  7.5 cc [employer] COMPARISON:  Right shoulder radiographs 06/01/2022 FINDINGS: Examination is quite limited due to patient motion. Rotator cuff: Moderate rotator cuff tendinopathy/tendinosis without obvious full-thickness retracted tear. Muscles:  Moderate myofasciitis involving the shoulder musculature.  Biceps long head: Severe tendinopathy involving the intra-articular portion of the biceps tendon which is markedly thickened and edematous. Possible septic tendinopathy. Acromioclavicular Joint: Moderate to advanced degenerative changes but no findings suspicious for septic arthritis. Glenohumeral Joint: Advanced degenerative changes with full-thickness cartilage loss, joint space narrowing and osteophytic spurring. There is a large complex joint effusion with thick synovial enhancement after contrast administration. Findings are highly suspicious for septic arthritis with surrounding myofasciitis and probable septic subacromial/subdeltoid bursitis. Labrum:  Degenerated and torn. Bones:  No worrisome osseous enhancement to suggest osteomyelitis. Other: Significant subacromial/subdeltoid bursitis suspicious for septic bursitis. IMPRESSION: 1. MR findings highly suspicious for septic arthritis involving the glenohumeral joint as detailed above. Suspect septic subacromial/subdeltoid bursitis and probable septic tendinopathy involving the rotator cuff tendons and the long head biceps tendon. 2. Surrounding myofasciitis without findings for pyomyositis. 3. No definite findings for osteomyelitis. These results will be called to the ordering clinician or  representative by the Radiologist Assistant, and communication documented in the PACS or [employer]. Electronically Signed   By: Rudie Meyer M.D.   On: 06/04/2022 11:31     Imaging independently reviewed in Epic.    Vedia Coffer for Infectious Disease [facility] [phone] pager 06/06/2022, 8:44 AM

## 2022-06-06 NOTE — Plan of Care (Signed)
  Problem: Education: Goal: Knowledge of General Education information will improve Description: Including pain rating scale, medication(s)/side effects and non-pharmacologic comfort measures Outcome: Progressing   Problem: Safety: Goal: Ability to remain free from injury will improve Outcome: Progressing   Problem: Skin Integrity: Goal: Risk for impaired skin integrity will decrease Outcome: Progressing   Problem: Pain Managment: Goal: General experience of comfort will improve Outcome: Not Progressing   

## 2022-06-06 NOTE — Progress Notes (Signed)
Wayne Stokes  BDZ:[id number] DOB: 06/02/1966 DOA: 05/26/2022 PCP: [name], NP    Brief Narrative:  56 year old with a history of DM2, HTN, anemia, lumbar DDD with spinal stenosis and claudication, T12 vertebral compression fracture, and mild cognitive impairment who was admitted due to uncontrolled low back pain and ambulatory difficulty related to his severe lumbar spinal stenosis and T12 compression fracture.  Orthopedics and neurosurgery have evaluated the patient and recommended TLSO brace and pain control.  IR was consulted for kyphoplasty.  On 9/24 the patient developed leukocytosis with a fever.  Consultants:  Interventional Radiology Orthopedics Neurosurgery  Goals of Care:  Code Status: Full Code   DVT prophylaxis: SCDs  Interim Hx: No acute events reported overnight.  Remains afebrile.  Some mild sinus tachycardia persists.  Vitals otherwise stable.  Resting comfortably at time of my visit.  Has had ongoing shoulder pain throughout the day.  Underwent successful anterior aspiration of right shoulder joint in IR today.  Assessment & Plan:  Lumbar spinal stenosis L4-L5 - T12 compression fracture Presented with severe back pain and ambulatory dysfunction -MRI revealed no evidence of cord injury -no surgical intervention suggested by Neurosurgery -TLSO brace for pain -IR was following for possible T12 kyphoplasty but this was delayed when patient developed fever  FUO -SIRS -severe right shoulder pain 9/24 patient developed leukocytosis tachycardia and fever to 102.2 -CXR was unremarkable -COVID-negative -cultures negative -MRI of the spine 9/18 raised concern of infection/discitis at the L4 level - aspiration at this level completed 9/25 in IR with empiric IV vancomycin and ceftriaxone being dosed after sampling - culture from spinal aspiration negative - ongoing pain in right shoulder prompted MRI which reported findings "highly suggestive of septic arthritis" - Orthopedics  aspirated shoulder 9/27 with no fluid present - IR aspirated joint 9/29 with no findings on fluid studies suggestive of infection -ID following  Acute urinary retention with acute kidney injury Felt to be due to pain and lack of mobility -baseline creatinine 1.1 with creatinine 1.42 at presentation -ARB and NSAIDs on hold -failed voiding trial -Flomax initiated -renal function has normalized  Constipation Due to immobility -continue bowel regimen  Thrush Noted 9/24 -continue nystatin  Vitamin D deficiency Vitamin D level 27.04 -supplementation initiated  Hyponatremia Sodium stable and now within normal range  Hypomagnesemia Persistent -continue supplementation  Hypokalemia Has normalized with supplementation  DM 2 A1c 7.11 March 2022 -CBG well controlled  HTN Blood pressure well controlled  Mild cognitive impairment Continue donepezil  HLD Continue atorvastatin  Microcytic iron deficient anemia Continue oral iron supplementation -no evidence of active bleeding -suspect this represents anemia of chronic inflammation/chronic disease -transfused 1 unit PRBC 9/28 -continue to monitor hemoglobin trend -iron studies consistent with anemia related to chronic inflammation/poor nutrition with element of iron deficiency  Recent Labs  Lab 06/04/22 1242 06/05/22 0338 06/05/22 1356 06/06/22 0246  HGB 7.3* 6.4* 8.4* 9.2*    Family Communication: No family present at time of exam Disposition: Inpatient status to continue   Objective: Blood pressure 129/76, pulse (!) 106, temperature 98 F (36.7 C), temperature source Oral, resp. rate 14, height 5' (1.524 m), weight 62.8 kg, SpO2 100 %.  Intake/Output Summary (Last 24 hours) at 06/06/2022 0859 Last data filed at 06/06/2022 0700 Gross per 24 hour  Intake 1195 ml  Output 2350 ml  Net -1155 ml    Filed Weights   06/02/22 0417 06/04/22 0315 06/06/22 0500  Weight: 61.3 kg 60 kg 62.8 kg  Examination: General: No acute  respiratory distress Lungs: Clear to auscultation bilaterally  Cardiovascular: Regular rate and rhythm without murmur  Abdomen: Nontender, nondistended, soft, bowel sounds positive, no rebound Extremities: No significant edema bilateral lower extremities  CBC: Recent Labs  Lab 06/02/22 0104 06/03/22 0141 06/04/22 0542 06/04/22 1242 06/05/22 0338 06/05/22 1356 06/06/22 0246  WBC 10.6* 7.3 7.8 8.8 8.5  --  8.7  NEUTROABS 7.9* 5.0 5.1  --   --   --   --   HGB 7.2* 7.3* 6.8* 7.3* 6.4* 8.4* 9.2*  HCT 22.1* 23.0* 20.2* 22.6* 19.6* 25.5* 27.8*  MCV 70.8* 71.4* 69.9* 71.3* 69.8*  --  73.0*  PLT 255 283 248 296 308  --  391    Basic Metabolic Panel: Recent Labs  Lab 05/31/22 0808 06/02/22 0104 06/04/22 0542 06/05/22 0338 06/06/22 0246  NA 134*   < > 133* 132* 135  K 3.6   < > 3.5 3.1* 4.1  CL 100   < > 94* 92* 96*  CO2 26   < > 29 29 28   GLUCOSE 93   < > 107* 98 105*  BUN 9   < > 8 6 <5*  CREATININE 0.99   < > 0.87 0.91 0.73  CALCIUM 8.5*   < > 9.1 9.0 9.2  MG 1.7   < > 1.4* 1.6* 1.7  PHOS 4.2  --   --   --   --    < > = values in this interval not displayed.    GFR: Estimated Creatinine Clearance: 79.4 mL/min (by C-G formula based on SCr of 0.73 mg/dL).   Scheduled Meds:  acetaminophen  500 mg Oral TID   atorvastatin  40 mg Oral Daily   Chlorhexidine Gluconate Cloth  6 each Topical Daily   cholecalciferol  1,000 Units Oral Daily   donepezil  5 mg Oral QHS   feeding supplement (GLUCERNA SHAKE)  237 mL Oral Q24H   ferrous sulfate  325 mg Oral Q breakfast   gabapentin  400 mg Oral TID   insulin aspart  0-9 Units Subcutaneous TID WC   lidocaine  1 patch Transdermal Q24H   lidocaine (PF)  5 mL Intradermal Once   methocarbamol  500 mg Oral TID   metoprolol succinate  12.5 mg Oral Daily   nystatin  5 mL Oral QID   polyethylene glycol  17 g Oral BID   potassium chloride  40 mEq Oral BID   senna-docusate  1 tablet Oral BID   tamsulosin  0.4 mg Oral Daily    Continuous Infusions:  cefTRIAXone (ROCEPHIN)  IV 2 g (06/05/22 2022)   lactated ringers 75 mL/hr at 06/03/22 0315   methocarbamol (ROBAXIN) IV Stopped (05/30/22 1726)   vancomycin 750 mg (06/06/22 0542)     LOS: 11 days   06/08/22, MD [facility] Office  [phone] Pager - Text Page per [phone]  If 7PM-7AM, please contact night-coverage per Amion 06/06/2022, 8:59 AM

## 2022-06-06 NOTE — Progress Notes (Signed)
Pharmacy Antibiotic Note  Wayne Stokes is a 56 y.o. male admitted on 05/26/2022 with septic arthritis.  Continues on Vancomycin / CTX  Scr stable/awaiting shoulder aspiration   Plan: Continue Vancomycin 750 mg IV every 12 hours.   eAUC: 468 mcg*hr/mL (utilized Scr 0.86 mg/dL)  Continue Ceftriaxone 2 grams iv Q 24 hours   Height: 5' (152.4 cm) Weight: 62.8 kg (138 lb 7.2 oz) IBW/kg (Calculated) : 50  Temp (24hrs), Avg:98.3 F (36.8 C), Min:98 F (36.7 C), Max:98.9 F (37.2 C)  Recent Labs  Lab 06/02/22 0104 06/03/22 0141 06/04/22 0542 06/04/22 1242 06/05/22 0338 06/06/22 0246  WBC 10.6* 7.3 7.8 8.8 8.5 8.7  CREATININE 0.86 0.86 0.87  --  0.91 0.73     Estimated Creatinine Clearance: 79.4 mL/min (by C-G formula based on SCr of 0.73 mg/dL).    Allergies  Allergen Reactions   Beef-Derived Products Swelling   Pork-Derived Products Swelling   Shellfish Allergy Rash   Eggs Or Egg-Derived Products Swelling   Other Other (See Comments)    Egg plant , pizza - swelling    Thank you Anette Guarneri, PharmD Clinical Pharmacist 06/06/2022 10:21 AM

## 2022-06-06 NOTE — Progress Notes (Signed)
Inpatient Rehab Admissions Coordinator:    CIR following at a distance, but Pt. Currently not at a level that he could participate in the intensity of CIR and leave with supervision level goals (which he would need due to decreased support at home). Recommend TOC also look into other rehab venues for Pt.  Clemens Catholic, [place], [place] Admissions Coordinator  [phone] ([place]) [phone] (office)

## 2022-06-06 NOTE — Progress Notes (Signed)
Occupational Therapy Treatment Patient Details Name: Wayne Stokes MRN: [id number] DOB: 1965/11/16 Today's Date: 06/06/2022   History of present illness 56 y.o. male presented 05/26/22 with c/o severe and progressively worsening low back pain that radiates into his left buttock and down his LLE. Imaging +acute T12 compression fracture and  L4/5 there is multifactorial severe spinal stenosis and severe bilateral foraminal stenosis. Neurosurgery consult with no acute surgical intervention needed. 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   OT comments  Patient seen with PT and interpreter, Wayne Stokes. Patient premedicated before visit but continues to demonstrate pain at back and RLE. Patient required assistance of 2 to get to EOB and stood x2 to perform cleaning due to soiling bed. Patient was mod assist +2 to transfer to recliner with HHA and positioned in chair. Patient tolerated a few minutes in chair demonstrating sliding out of chair to address pain and asking to return to bed. Patient returned to supine and setup for lunch with patient stating it was difficulty to eat with pain. Acute OT to continue to follow.    Recommendations for follow up therapy are one component of a multi-disciplinary discharge planning process, led by the attending physician.  Recommendations may be updated based on patient status, additional functional criteria and insurance authorization.    Follow Up Recommendations  Acute inpatient rehab (3hours/day) (pending progress and medical plan of care)    Assistance Recommended at Discharge Intermittent Supervision/Assistance  Patient can return home with the following  A lot of help with walking and/or transfers;A lot of help with bathing/dressing/bathroom   Equipment Recommendations  BSC/3in1    Recommendations for Other Services      Precautions / Restrictions  Precautions Precautions: Fall;Back Precaution Booklet Issued: No Precaution Comments: reviewed BLT back rule for pt comfort Required Braces or Orthoses: Spinal Brace Spinal Brace: Thoracolumbosacral orthotic;Applied in sitting position (when OOB for comfort) Restrictions Weight Bearing Restrictions: No       Mobility Bed Mobility Overal bed mobility: Needs Assistance Bed Mobility: Rolling, Sidelying to Sit, Sit to Sidelying Rolling: Mod assist Sidelying to sit: Mod assist, +2 for physical assistance     Sit to sidelying: Mod assist, +2 for physical assistance General bed mobility comments: patient with increased pain requiring assistance of 2 for sidelying to sit and sit to sidelying    Transfers Overall transfer level: Needs assistance Equipment used: None Transfers: Sit to/from Stand, Bed to chair/wheelchair/BSC Sit to Stand: Mod assist, +2 safety/equipment     Step pivot transfers: Mod assist, +2 physical assistance, +2 safety/equipment     General transfer comment: performed transfer from EOB to recliner with mod assist +2 hand held assist. Patient could not tolerate sitting long and returned to supine     Balance Overall balance assessment: Needs assistance, History of Falls Sitting-balance support: Feet supported, No upper extremity supported Sitting balance-Leahy Scale: Fair     Standing balance support: Bilateral upper extremity supported, During functional activity, Reliant on assistive device for balance Standing balance-Leahy Scale: Poor Standing balance comment: reliant on external support for balance                           ADL either performed or assessed with clinical judgement   ADL Overall ADL's : Needs assistance/impaired Eating/Feeding: Set up           Lower Body Bathing: Maximal assistance;Sit to/from  stand Lower Body Bathing Details (indicate cue type and reason): to clean bottom while standing Upper Body Dressing : Minimal  assistance Upper Body Dressing Details (indicate cue type and reason): min assist to change gown                   General ADL Comments: patient had soiled bed and stood to clean bottom    Extremity/Trunk Assessment Upper Extremity Assessment RUE Deficits / Details: right shoulder pain            Vision       Perception     Praxis      Cognition Arousal/Alertness: Awake/alert Behavior During Therapy: WFL for tasks assessed/performed Overall Cognitive Status: Within Functional Limits for tasks assessed                                 General Comments: Patient seen with interpreter, [facility]        Exercises      Shoulder Instructions       General Comments      Pertinent Vitals/ Pain       Pain Assessment Pain Assessment: Faces Faces Pain Scale: Hurts whole lot Pain Location: some low back and RLE Pain Descriptors / Indicators: Discomfort, Grimacing, Guarding, Spasm Pain Intervention(s): Limited activity within patient's tolerance, Monitored during session, Premedicated before session, Repositioned  Home Living                                          Prior Functioning/Environment              Frequency  Min 2X/week        Progress Toward Goals  OT Goals(current goals can now be found in the care plan section)  Progress towards OT goals: Progressing toward goals  Acute Rehab OT Goals Patient Stated Goal: get better OT Goal Formulation: With patient Time For Goal Achievement: 06/14/22 Potential to Achieve Goals: Good ADL Goals Pt Will Perform Grooming: with modified independence;standing Pt Will Perform Lower Body Dressing: with modified independence;sit to/from stand Pt Will Transfer to Toilet: with modified independence;ambulating Additional ADL Goal #1: Pt will demonstrate independence with 3/3 back precautions. Additional ADL Goal #2: Pt will demonstrate independence with wear schedule and  donning/doffing back brace.  Plan Discharge plan remains appropriate    Co-evaluation    PT/OT/SLP Co-Evaluation/Treatment: Yes Reason for Co-Treatment: For patient/therapist safety;To address functional/ADL transfers   OT goals addressed during session: ADL's and self-care      AM-PAC OT "6 Clicks" Daily Activity     Outcome Measure   Help from another person eating meals?: A Little Help from another person taking care of personal grooming?: A Little Help from another person toileting, which includes using toliet, bedpan, or urinal?: A Lot Help from another person bathing (including washing, rinsing, drying)?: A Lot Help from another person to put on and taking off regular upper body clothing?: A Little Help from another person to put on and taking off regular lower body clothing?: A Lot 6 Click Score: 15    End of Session    OT Visit Diagnosis: Other abnormalities of gait and mobility (R26.89);Muscle weakness (generalized) (M62.81);Pain Pain - Right/Left: Right Pain - part of body: Shoulder   Activity Tolerance Patient limited by pain   Patient Left in bed;with  call bell/phone within reach;with bed alarm set;with family/visitor present (interpreter in room)   Nurse Communication Mobility status;Other (comment) (continues to demostrate pain)        Time: [id number] OT Time Calculation (min): 32 min  Charges: OT General Charges $OT Visit: 1 Visit OT Treatments $Self Care/Home Management : 8-22 mins  Lodema Hong, OTA Acute Rehabilitation Services  Office [place] 06/06/2022, 3:16 PM

## 2022-06-07 LAB — CBC
HCT: 24.6 % — ABNORMAL LOW (ref 39.0–52.0)
Hemoglobin: 8 g/dL — ABNORMAL LOW (ref 13.0–17.0)
MCH: 23.8 pg — ABNORMAL LOW (ref 26.0–34.0)
MCHC: 32.5 g/dL (ref 30.0–36.0)
MCV: 73.2 fL — ABNORMAL LOW (ref 80.0–100.0)
Platelets: 404 10*3/uL — ABNORMAL HIGH (ref 150–400)
RBC: 3.36 MIL/uL — ABNORMAL LOW (ref 4.22–5.81)
RDW: 17.7 % — ABNORMAL HIGH (ref 11.5–15.5)
WBC: 9.6 10*3/uL (ref 4.0–10.5)
nRBC: 0 % (ref 0.0–0.2)

## 2022-06-07 LAB — AEROBIC/ANAEROBIC CULTURE W GRAM STAIN (SURGICAL/DEEP WOUND)
Culture: NO GROWTH
Gram Stain: NONE SEEN

## 2022-06-07 LAB — BASIC METABOLIC PANEL
Anion gap: 10 (ref 5–15)
BUN: 5 mg/dL — ABNORMAL LOW (ref 6–20)
CO2: 27 mmol/L (ref 22–32)
Calcium: 9.3 mg/dL (ref 8.9–10.3)
Chloride: 98 mmol/L (ref 98–111)
Creatinine, Ser: 0.69 mg/dL (ref 0.61–1.24)
GFR, Estimated: >60 mL/min (ref >60)
Glucose, Bld: 69 mg/dL — ABNORMAL LOW (ref 70–99)
Potassium: 4.3 mmol/L (ref 3.5–5.1)
Sodium: 135 mmol/L (ref 135–145)

## 2022-06-07 LAB — GLUCOSE, CAPILLARY
Glucose-Capillary: 76 mg/dL (ref 70–99)
Glucose-Capillary: 64 mg/dL — ABNORMAL LOW (ref 70–99)
Glucose-Capillary: 69 mg/dL — ABNORMAL LOW (ref 70–99)
Glucose-Capillary: 114 mg/dL — ABNORMAL HIGH (ref 70–99)
Glucose-Capillary: 85 mg/dL (ref 70–99)
Glucose-Capillary: 102 mg/dL — ABNORMAL HIGH (ref 70–99)

## 2022-06-07 LAB — MAGNESIUM: Magnesium: 1.4 mg/dL — ABNORMAL LOW (ref 1.7–2.4)

## 2022-06-07 MED ORDER — MAGNESIUM SULFATE 4 GM/100ML IV SOLN
4.0000 g | Freq: Once | INTRAVENOUS | Status: AC
  Administered 2022-06-07: 4 g via INTRAVENOUS
  Filled 2022-06-07 (×2): qty 100

## 2022-06-07 MED ORDER — BETHANECHOL CHLORIDE 10 MG PO TABS
10.0000 mg | ORAL_TABLET | Freq: Three times a day (TID) | ORAL | Status: DC
  Administered 2022-06-07 – 2022-06-10 (×9): 10 mg via ORAL
  Filled 2022-06-07 (×10): qty 1

## 2022-06-07 MED ORDER — MAGNESIUM GLUCONATE 500 MG PO TABS
500.0000 mg | ORAL_TABLET | Freq: Two times a day (BID) | ORAL | Status: DC
  Administered 2022-06-07 – 2022-07-20 (×73): 500 mg via ORAL
  Filled 2022-06-07 (×89): qty 1

## 2022-06-07 NOTE — Progress Notes (Signed)
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  Orthopedics and neurosurgery have evaluated the patient and recommended TLSO brace and pain control.  IR was consulted for kyphoplasty.  On 9/24 the patient developed leukocytosis with a fever.  Consultants:  Interventional Radiology Orthopedics Neurosurgery  Goals of Care:  Code Status: Full Code   DVT prophylaxis: SCDs  Interim Hx: Afebrile.  Vital signs stable.  No acute events reported overnight.  Is able to tell me that his right shoulder feels better but now his left shoulder is hurting.  No nausea or vomiting.  Is quite tired.  Assessment & Plan:  Lumbar spinal stenosis L4-L5 - T12 compression fracture Presented with severe back pain and ambulatory dysfunction -MRI revealed no evidence of cord injury -no surgical intervention suggested by Neurosurgery -TLSO brace for pain -IR was following for possible T12 kyphoplasty but this was delayed when patient developed fever  FUO -SIRS -severe right shoulder pain 9/24 patient developed leukocytosis tachycardia and fever to 102.2 -CXR was unremarkable -COVID-negative -cultures negative -MRI of the spine 9/18 raised concern of infection/discitis at the L4 level - aspiration at this level completed 9/25 in IR with empiric IV vancomycin and ceftriaxone being dosed after sampling - culture from spinal aspiration negative - ongoing pain in right shoulder prompted MRI which reported findings "highly suggestive of septic arthritis" - Orthopedics aspirated shoulder 9/27 with no fluid present - IR aspirated joint 9/29 with no findings  on fluid studies suggestive of infection -ID following -antibiotics and scheduled Tylenol now discontinued -monitoring over the weekend for recurrence of fever  Microcytic iron deficient anemia Continue oral iron supplementation -no evidence of active bleeding -suspect this represents anemia of chronic inflammation/chronic disease -transfused 1 unit PRBC 9/28 -continue to monitor hemoglobin trend -iron studies consistent with anemia related to chronic inflammation/poor nutrition with element of iron deficiency  Recent Labs  Lab 06/05/22 0338 06/05/22 1356 06/06/22 0246 06/07/22 0302  HGB 6.4* 8.4* 9.2* 8.0*    Acute urinary retention with acute kidney injury Felt to be due to pain and lack of mobility - baseline creatinine 1.1 with creatinine 1.42 at presentation - ARB and NSAIDs on hold - failed voiding trial - Flomax initiated -renal function has normalized -retry voiding trial in a.m.  Constipation Due to immobility -continue bowel regimen  Thrush Noted 9/24 -continue nystatin  Vitamin D deficiency Vitamin D level 27.04 -supplementation initiated  Hyponatremia Sodium stable and now within normal range  Hypomagnesemia Persistent -continue supplementation  Hypokalemia Has normalized with supplementation  DM 2 A1c 7.11 March 2022 -CBG well controlled with episodes of relative hypoglycemia therefore will liberalize diet  HTN Blood pressure well controlled  Mild cognitive impairment Continue donepezil  HLD Continue atorvastatin  Family Communication: No family present at time of exam Disposition: Inpatient status to continue   Objective: Blood pressure 108/79, pulse 94, temperature 98.8 F (37.1 C), temperature source Oral, resp. rate 15, height 5' (1.524 m), weight 61.7 kg, SpO2 91 %.  Intake/Output Summary (Last 24 hours) at 06/07/2022 0919 Last data filed at 06/06/2022 2231 Gross per 24 hour  Intake --  Output 2100 ml  Net -2100 ml  Filed Weights    06/04/22 0315 06/06/22 0500 06/07/22 0500  Weight: 60 kg 62.8 kg 61.7 kg    Examination: General: No acute respiratory distress Lungs: Clear to auscultation bilaterally -no wheezing Cardiovascular: Regular rate and rhythm without murmur  Abdomen: Nontender, nondistended, soft, bowel sounds positive, no rebound Extremities: No significant edema bilateral lower extremities  CBC: Recent Labs  Lab 06/02/22 0104 06/03/22 0141 06/04/22 0542 06/04/22 1242 06/05/22 0338 06/05/22 1356 06/06/22 0246 06/07/22 0302  WBC 10.6* 7.3 7.8   < > 8.5  --  8.7 9.6  NEUTROABS 7.9* 5.0 5.1  --   --   --   --   --   HGB 7.2* 7.3* 6.8*   < > 6.4* 8.4* 9.2* 8.0*  HCT 22.1* 23.0* 20.2*   < > 19.6* 25.5* 27.8* 24.6*  MCV 70.8* 71.4* 69.9*   < > 69.8*  --  73.0* 73.2*  PLT 255 283 248   < > 308  --  391 404*   < > = values in this interval not displayed.    Basic Metabolic Panel: Recent Labs  Lab 06/05/22 0338 06/06/22 0246 06/07/22 0302  NA 132* 135 135  K 3.1* 4.1 4.3  CL 92* 96* 98  CO2 29 28 27   GLUCOSE 98 105* 69*  BUN 6 <5* 5*  CREATININE 0.91 0.73 0.69  CALCIUM 9.0 9.2 9.3  MG 1.6* 1.7 1.4*    GFR: Estimated Creatinine Clearance: 78.8 mL/min (by C-G formula based on SCr of 0.69 mg/dL).   Scheduled Meds:  atorvastatin  40 mg Oral Daily   Chlorhexidine Gluconate Cloth  6 each Topical Daily   cholecalciferol  1,000 Units Oral Daily   donepezil  5 mg Oral QHS   feeding supplement (GLUCERNA SHAKE)  237 mL Oral Q24H   ferrous sulfate  325 mg Oral Q breakfast   gabapentin  400 mg Oral TID   insulin aspart  0-9 Units Subcutaneous TID WC   lidocaine  1 patch Transdermal Q24H   methocarbamol  500 mg Oral TID   metoprolol succinate  12.5 mg Oral Daily   nystatin  5 mL Oral QID   polyethylene glycol  17 g Oral BID   potassium chloride  40 mEq Oral Daily   senna-docusate  1 tablet Oral BID   tamsulosin  0.4 mg Oral Daily   Continuous Infusions:  methocarbamol (ROBAXIN) IV Stopped  (05/30/22 1726)     LOS: 12 days   Cherene Altes, MD [facility] Office  [phone] Pager - Text Page per Shea Evans  If 7PM-7AM, please contact night-coverage per Amion 06/07/2022, 9:19 AM

## 2022-06-07 NOTE — Progress Notes (Signed)
Infectious disease follow-up note  Patient underwent IR aspiration of his right shoulder yesterday.  This was remarkable for a cell count of only 273.  No crystals were seen.  Cultures are pending but no organisms noted on Gram stain.  Patient remains without leukocytosis.  Other labs are stable.    We discontinued his antibiotics yesterday afternoon given the overall picture and less suspicion for infection at this time.  He has been afebrile now since the isolated fever on 9/24.  However, he has been receiving acetaminophen 3 times daily.  I will discontinue this for now in case it is masking any fevers.    If patient remains afebrile over the weekend off antibiotics and antipyretics, would consider reengaging with orthopedics to see if they would consider a diagnostic and therapeutic intra-articular steroid injection.  ID will continue to follow.   Raynelle Highland for Infectious Disease [facility] 06/07/2022, 8:30 AM

## 2022-06-07 NOTE — Plan of Care (Signed)

## 2022-06-08 LAB — MAGNESIUM: Magnesium: 2.2 mg/dL (ref 1.7–2.4)

## 2022-06-08 LAB — CBC
HCT: 24.9 % — ABNORMAL LOW (ref 39.0–52.0)
Hemoglobin: 8.3 g/dL — ABNORMAL LOW (ref 13.0–17.0)
MCH: 23.9 pg — ABNORMAL LOW (ref 26.0–34.0)
MCHC: 33.3 g/dL (ref 30.0–36.0)
MCV: 71.6 fL — ABNORMAL LOW (ref 80.0–100.0)
Platelets: 467 10*3/uL — ABNORMAL HIGH (ref 150–400)
RBC: 3.48 MIL/uL — ABNORMAL LOW (ref 4.22–5.81)
RDW: 18.3 % — ABNORMAL HIGH (ref 11.5–15.5)
WBC: 10.6 10*3/uL — ABNORMAL HIGH (ref 4.0–10.5)
nRBC: 0.3 % — ABNORMAL HIGH (ref 0.0–0.2)

## 2022-06-08 LAB — GLUCOSE, CAPILLARY
Glucose-Capillary: 131 mg/dL — ABNORMAL HIGH (ref 70–99)
Glucose-Capillary: 144 mg/dL — ABNORMAL HIGH (ref 70–99)
Glucose-Capillary: 154 mg/dL — ABNORMAL HIGH (ref 70–99)
Glucose-Capillary: 136 mg/dL — ABNORMAL HIGH (ref 70–99)

## 2022-06-08 LAB — BASIC METABOLIC PANEL
Anion gap: 12 (ref 5–15)
BUN: <5 mg/dL — ABNORMAL LOW (ref 6–20)
CO2: 27 mmol/L (ref 22–32)
Calcium: 9.2 mg/dL (ref 8.9–10.3)
Chloride: 93 mmol/L — ABNORMAL LOW (ref 98–111)
Creatinine, Ser: 0.8 mg/dL (ref 0.61–1.24)
GFR, Estimated: >60 mL/min (ref >60)
Glucose, Bld: 91 mg/dL (ref 70–99)
Potassium: 3.6 mmol/L (ref 3.5–5.1)
Sodium: 132 mmol/L — ABNORMAL LOW (ref 135–145)

## 2022-06-08 MED ORDER — TRAMADOL HCL 50 MG PO TABS
50.0000 mg | ORAL_TABLET | Freq: Four times a day (QID) | ORAL | Status: DC | PRN
  Administered 2022-06-08 – 2022-06-25 (×8): 50 mg via ORAL
  Filled 2022-06-08 (×10): qty 1

## 2022-06-08 MED ORDER — OXYCODONE HCL 5 MG PO TABS
10.0000 mg | ORAL_TABLET | ORAL | Status: DC | PRN
  Administered 2022-06-08 – 2022-07-06 (×31): 10 mg via ORAL
  Filled 2022-06-08 (×33): qty 2

## 2022-06-08 NOTE — Progress Notes (Signed)
Brief ID follow-up note  Patient's antibiotics discontinued.  Scheduled Tylenol discontinued.  Tmax last 24 hours 98.8.  No other events noted.  No new imaging.  Lab work today is overall somewhat stable.  His white blood cell count is slightly elevated.  Continue to monitor off antibiotics and antipyretics.  We will follow-up again tomorrow.   Raynelle Highland for Infectious Disease [facility] 06/08/2022, 8:25 AM

## 2022-06-08 NOTE — Progress Notes (Signed)
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  Orthopedics and Neurosurgery have evaluated the patient and recommended TLSO brace and pain control.  IR was consulted for kyphoplasty.  On 9/24 the patient developed leukocytosis with a fever.  Consultants:  Interventional Radiology Orthopedics Neurosurgery  Goals of Care:  Code Status: Full Code   DVT prophylaxis: SCDs  Interim Hx: Remains afebrile.  Some sinus tachycardia noted.  Vitals otherwise stable.  WBC ever so slightly elevated.  Hemoglobin holding steady.  Resting comfortably at the time of my visit.  Indicates improvement but persisting pain in the right shoulder and now worsening pain in the left shoulder.  Has been refusing to get out of bed.  Despite an order for his Foley to be removed he has refused to let his nurse do so today.  Assessment & Plan:  Lumbar spinal stenosis L4-L5 - T12 compression fracture Presented with severe back pain and ambulatory dysfunction -MRI revealed no evidence of cord injury -no surgical intervention suggested by Neurosurgery -TLSO brace for pain -IR was following for possible T12 kyphoplasty but this was delayed when patient developed fever  FUO -SIRS -severe right shoulder pain 9/24 patient developed leukocytosis tachycardia and fever to 102.2 -CXR was unremarkable -COVID-negative -cultures negative -MRI of the spine 9/18 raised concern of infection/discitis at the L4 level - aspiration at this level completed 9/25 in IR with empiric IV vancomycin and ceftriaxone being dosed after sampling - culture from spinal aspiration  negative - ongoing pain in right shoulder prompted MRI which reported findings "highly suggestive of septic arthritis" - Orthopedics aspirated shoulder 9/27 with no fluid present - IR aspirated joint 9/29 with no findings on fluid studies suggestive of infection -ID following -antibiotics and scheduled Tylenol now discontinued -monitoring over the weekend for recurrence of fever - at this time the pain in his R shoulder had improved significantly, but he has developed pain in the L shoulder   Microcytic iron deficient anemia Continue oral iron supplementation -no evidence of active bleeding -suspect this represents anemia of chronic inflammation/chronic disease -transfused 1 unit PRBC 9/28 -continue to monitor hemoglobin trend -iron studies consistent with anemia related to chronic inflammation/poor nutrition with element of iron deficiency  Recent Labs  Lab 06/05/22 1356 06/06/22 0246 06/07/22 0302 06/08/22 0358  HGB 8.4* 9.2* 8.0* 8.3*    Acute urinary retention with acute kidney injury Felt to be due to pain and lack of mobility - baseline creatinine 1.1 with creatinine 1.42 at presentation - ARB and NSAIDs on hold - failed voiding trial - Flomax initiated -renal function has normalized -retry voiding trial today  Constipation Due to immobility -continue bowel regimen  Thrush Noted 9/24 - on nystatin   Vitamin D deficiency Vitamin D level 27.04 - supplementation ongoing   Hyponatremia Sodium stable and now within normal range  Hypomagnesemia Corrected w/ supplementation   Hypokalemia Has normalized with supplementation  DM 2 A1c 7.11 March 2022 -CBG well controlled   HTN Blood pressure well controlled  Mild cognitive impairment Continue donepezil  HLD Continue atorvastatin  Family Communication: No family present at time of exam Disposition: Inpatient status to continue   Objective:  Blood pressure 110/80, pulse 99, temperature 97.6 F (36.4 C), temperature source  Oral, resp. rate 16, height 5' (1.524 m), weight 61.7 kg, SpO2 91 %.  Intake/Output Summary (Last 24 hours) at 06/08/2022 0937 Last data filed at 06/08/2022 0500 Gross per 24 hour  Intake 100 ml  Output 2400 ml  Net -2300 ml    Filed Weights   06/04/22 0315 06/06/22 0500 06/07/22 0500  Weight: 60 kg 62.8 kg 61.7 kg    Examination: General: No acute respiratory distress Lungs: Clear to auscultation bilaterally Cardiovascular: Regular rate and rhythm without murmur  Abdomen: Nontender, nondistended, soft, bowel sounds positive, no rebound Extremities: No C/C/E BLE  CBC: Recent Labs  Lab 06/02/22 0104 06/03/22 0141 06/04/22 0542 06/04/22 1242 06/06/22 0246 06/07/22 0302 06/08/22 0358  WBC 10.6* 7.3 7.8   < > 8.7 9.6 10.6*  NEUTROABS 7.9* 5.0 5.1  --   --   --   --   HGB 7.2* 7.3* 6.8*   < > 9.2* 8.0* 8.3*  HCT 22.1* 23.0* 20.2*   < > 27.8* 24.6* 24.9*  MCV 70.8* 71.4* 69.9*   < > 73.0* 73.2* 71.6*  PLT 255 283 248   < > 391 404* 467*   < > = values in this interval not displayed.    Basic Metabolic Panel: Recent Labs  Lab 06/06/22 0246 06/07/22 0302 06/08/22 0358  NA 135 135 132*  K 4.1 4.3 3.6  CL 96* 98 93*  CO2 28 27 27   GLUCOSE 105* 69* 91  BUN <5* 5* <5*  CREATININE 0.73 0.69 0.80  CALCIUM 9.2 9.3 9.2  MG 1.7 1.4* 2.2    GFR: Estimated Creatinine Clearance: 78.8 mL/min (by C-G formula based on SCr of 0.8 mg/dL).   Scheduled Meds:  atorvastatin  40 mg Oral Daily   bethanechol  10 mg Oral TID   Chlorhexidine Gluconate Cloth  6 each Topical Daily   cholecalciferol  1,000 Units Oral Daily   donepezil  5 mg Oral QHS   feeding supplement (GLUCERNA SHAKE)  237 mL Oral Q24H   ferrous sulfate  325 mg Oral Q breakfast   gabapentin  400 mg Oral TID   insulin aspart  0-9 Units Subcutaneous TID WC   lidocaine  1 patch Transdermal Q24H   magnesium gluconate  500 mg Oral BID   metoprolol succinate  12.5 mg Oral Daily   nystatin  5 mL Oral QID    polyethylene glycol  17 g Oral BID   potassium chloride  40 mEq Oral Daily   senna-docusate  1 tablet Oral BID   tamsulosin  0.4 mg Oral Daily     LOS: 13 days   , MD [facility] Office  [phone] Pager - Text Page per [phone]  If 7PM-7AM, please contact night-coverage per Amion 06/08/2022, 9:37 AM

## 2022-06-09 DIAGNOSIS — M5441 Lumbago with sciatica, right side: Secondary | ICD-10-CM

## 2022-06-09 DIAGNOSIS — M5442 Lumbago with sciatica, left side: Secondary | ICD-10-CM

## 2022-06-09 DIAGNOSIS — G8929 Other chronic pain: Secondary | ICD-10-CM

## 2022-06-09 DIAGNOSIS — Z794 Long term (current) use of insulin: Secondary | ICD-10-CM

## 2022-06-09 DIAGNOSIS — S22080A Wedge compression fracture of T11-T12 vertebra, initial encounter for closed fracture: Secondary | ICD-10-CM

## 2022-06-09 LAB — CBC
HCT: 25.7 % — ABNORMAL LOW (ref 39.0–52.0)
Hemoglobin: 8.5 g/dL — ABNORMAL LOW (ref 13.0–17.0)
MCH: 23.7 pg — ABNORMAL LOW (ref 26.0–34.0)
MCHC: 33.1 g/dL (ref 30.0–36.0)
MCV: 71.6 fL — ABNORMAL LOW (ref 80.0–100.0)
Platelets: 526 10*3/uL — ABNORMAL HIGH (ref 150–400)
RBC: 3.59 MIL/uL — ABNORMAL LOW (ref 4.22–5.81)
RDW: 18.4 % — ABNORMAL HIGH (ref 11.5–15.5)
WBC: 9.8 10*3/uL (ref 4.0–10.5)
nRBC: 0 % (ref 0.0–0.2)

## 2022-06-09 LAB — BODY FLUID CULTURE W GRAM STAIN: Culture: NO GROWTH

## 2022-06-09 LAB — BASIC METABOLIC PANEL
Anion gap: 9 (ref 5–15)
BUN: 6 mg/dL (ref 6–20)
CO2: 29 mmol/L (ref 22–32)
Calcium: 9.4 mg/dL (ref 8.9–10.3)
Chloride: 92 mmol/L — ABNORMAL LOW (ref 98–111)
Creatinine, Ser: 0.9 mg/dL (ref 0.61–1.24)
GFR, Estimated: >60 mL/min (ref >60)
Glucose, Bld: 118 mg/dL — ABNORMAL HIGH (ref 70–99)
Potassium: 4.2 mmol/L (ref 3.5–5.1)
Sodium: 130 mmol/L — ABNORMAL LOW (ref 135–145)

## 2022-06-09 LAB — URIC ACID: Uric Acid, Serum: 4.4 mg/dL (ref 3.7–8.6)

## 2022-06-09 LAB — GLUCOSE, CAPILLARY
Glucose-Capillary: 102 mg/dL — ABNORMAL HIGH (ref 70–99)
Glucose-Capillary: 129 mg/dL — ABNORMAL HIGH (ref 70–99)
Glucose-Capillary: 125 mg/dL — ABNORMAL HIGH (ref 70–99)
Glucose-Capillary: 118 mg/dL — ABNORMAL HIGH (ref 70–99)

## 2022-06-09 MED ORDER — METHOCARBAMOL 500 MG PO TABS
500.0000 mg | ORAL_TABLET | Freq: Three times a day (TID) | ORAL | Status: DC | PRN
  Administered 2022-06-09 – 2022-07-06 (×21): 500 mg via ORAL
  Filled 2022-06-09 (×24): qty 1

## 2022-06-09 MED ORDER — MORPHINE SULFATE ER 15 MG PO TBCR
15.0000 mg | EXTENDED_RELEASE_TABLET | Freq: Two times a day (BID) | ORAL | Status: DC
  Administered 2022-06-09 – 2022-07-26 (×84): 15 mg via ORAL
  Filled 2022-06-09 (×91): qty 1

## 2022-06-09 NOTE — Progress Notes (Signed)
Occupational Therapy Treatment Patient Details Name: Wayne Stokes MRN: [id number] DOB: 07/23/1966 Today's Date: 06/09/2022   History of present illness 56 y.o. male presented 05/26/22 with c/o severe and progressively worsening low back pain that radiates into his left buttock and down his LLE. Imaging +acute T12 compression fracture and  L4/5 there is multifactorial severe spinal stenosis and severe bilateral foraminal stenosis. Neurosurgery consult with no acute surgical intervention needed. IR consulted for possible kyphoplasty but has been delayed due to fever/SIRS and possible septic arthritis bil shoulder. 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   OT comments  Nursing states patient has had complaints of not being about to feed self due to difficulty holding utensils. Red foam was presented and used to build up spoon. Patient was able to feed self apple sauce with built up spoon and stated it felt bette. Patient instructed on log rolling technique with mod assist of 1 but began to slide to edge of bed and required assistance of another. Patient stood from EOB and began to have a bowel movement. Patient returned to sitting and stood again for cleaning and transferred to recliner with mod assist +2 HHA. Patient positioned in recliner for comfort and encouraged to stay up at least an hour. Patient continues to be limited by pain.  Acute OT to continue to follow.    Recommendations for follow up therapy are one component of a multi-disciplinary discharge planning process, led by the attending physician.  Recommendations may be updated based on patient status, additional functional criteria and insurance authorization.    Follow Up Recommendations  Acute inpatient rehab (3hours/day) (pending progress and medical plan of care)    Assistance Recommended at Discharge Frequent or constant  Supervision/Assistance  Patient can return home with the following  A lot of help with walking and/or transfers;A lot of help with bathing/dressing/bathroom   Equipment Recommendations  BSC/3in1    Recommendations for Other Services      Precautions / Restrictions Precautions Precautions: Fall;Back Precaution Booklet Issued: No Precaution Comments: reviewed BLT back rule for pt comfort Required Braces or Orthoses: Spinal Brace Spinal Brace: Thoracolumbosacral orthotic;Applied in sitting position Restrictions Weight Bearing Restrictions: No       Mobility Bed Mobility Overal bed mobility: Needs Assistance Bed Mobility: Rolling, Sidelying to Sit Rolling: Mod assist Sidelying to sit: Mod assist, +2 for physical assistance       General bed mobility comments: limited by pain    Transfers Overall transfer level: Needs assistance Equipment used: None Transfers: Sit to/from Stand, Bed to chair/wheelchair/BSC Sit to Stand: Mod assist, +2 safety/equipment     Step pivot transfers: Mod assist, +2 physical assistance, +2 safety/equipment     General transfer comment: Transferred from EOB to recliner with HHA +2 mod assist and frequent cues     Balance Overall balance assessment: Needs assistance, History of Falls Sitting-balance support: Feet supported, No upper extremity supported Sitting balance-Leahy Scale: Poor Sitting balance - Comments: min guard for safety   Standing balance support: Bilateral upper extremity supported, During functional activity, Reliant on assistive device for balance Standing balance-Leahy Scale: Poor Standing balance comment: reliant on external support for balance                           ADL either performed or assessed with clinical judgement   ADL Overall ADL's : Needs assistance/impaired Eating/Feeding: With  adaptive utensils;Set up Eating/Feeding Details (indicate cue type and reason): nursing stated that patient was  complaining of not being about to hold utensils. Patient provided red foam to build up utensil with increased grip Grooming: Wash/dry face;Wash/dry hands;Set up;Bed level Grooming Details (indicate cue type and reason): washed hands and face with complaints of shoulder pain     Lower Body Bathing: Maximal assistance;Sit to/from stand Lower Body Bathing Details (indicate cue type and reason): to clean bottom while standing                       General ADL Comments: patient is limited due to pain    Extremity/Trunk Assessment Upper Extremity Assessment Upper Extremity Assessment: LUE deficits/detail RUE Deficits / Details: right shoulder pain RUE Coordination: decreased fine motor;decreased gross motor LUE Deficits / Details: increased shoulder pain LUE Coordination: decreased fine motor;decreased gross motor            Vision       Perception     Praxis      Cognition Arousal/Alertness: Awake/alert Behavior During Therapy: WFL for tasks assessed/performed Overall Cognitive Status: Within Functional Limits for tasks assessed                                 General Comments: Patient seen with interpreter, Y Hin.        Exercises      Shoulder Instructions       General Comments      Pertinent Vitals/ Pain       Pain Assessment Pain Assessment: Faces Faces Pain Scale: Hurts whole lot Pain Location: BUE shoulders and BLEs Pain Descriptors / Indicators: Cramping, Discomfort, Grimacing, Guarding Pain Intervention(s): Limited activity within patient's tolerance, Monitored during session, Premedicated before session, Repositioned  Home Living                                          Prior Functioning/Environment              Frequency  Min 2X/week        Progress Toward Goals  OT Goals(current goals can now be found in the care plan section)  Progress towards OT goals: Progressing toward goals  Acute Rehab  OT Goals Patient Stated Goal: get better OT Goal Formulation: With patient Time For Goal Achievement: 06/14/22 Potential to Achieve Goals: Good ADL Goals Pt Will Perform Grooming: with modified independence;standing Pt Will Perform Lower Body Dressing: with modified independence;sit to/from stand Pt Will Transfer to Toilet: with modified independence;ambulating Additional ADL Goal #1: Pt will demonstrate independence with 3/3 back precautions. Additional ADL Goal #2: Pt will demonstrate independence with wear schedule and donning/doffing back brace.  Plan Discharge plan remains appropriate    Co-evaluation    PT/OT/SLP Co-Evaluation/Treatment: Yes Reason for Co-Treatment: Other (comment) (pain control; limited tech time)   OT goals addressed during session: ADL's and self-care      AM-PAC OT "6 Clicks" Daily Activity     Outcome Measure   Help from another person eating meals?: A Little Help from another person taking care of personal grooming?: A Little Help from another person toileting, which includes using toliet, bedpan, or urinal?: A Lot Help from another person bathing (including washing, rinsing, drying)?: A Lot Help from another person to put on and  taking off regular upper body clothing?: A Little Help from another person to put on and taking off regular lower body clothing?: A Lot 6 Click Score: 15    End of Session Equipment Utilized During Treatment: Back brace  OT Visit Diagnosis: Other abnormalities of gait and mobility (R26.89);Muscle weakness (generalized) (M62.81);Pain Pain - Right/Left: Right Pain - part of body: Shoulder   Activity Tolerance Patient limited by pain   Patient Left in chair;with call bell/phone within reach;with family/visitor present;with chair alarm set   Nurse Communication Mobility status;Other (comment) (recommended patient stay in chair an hour)        Time: 5537-4827 OT Time Calculation (min): 40 min  Charges: OT General  Charges $OT Visit: 1 Visit OT Treatments $Self Care/Home Management : 23-37 mins  [name], OTA Acute Rehabilitation Services  Office [phone]   [name] 06/09/2022, 1:43 PM

## 2022-06-09 NOTE — Plan of Care (Signed)
  Problem: Pain Managment: Goal: General experience of comfort will improve Outcome: Not Progressing   Problem: Education: Goal: Knowledge of General Education information will improve Description: Including pain rating scale, medication(s)/side effects and non-pharmacologic comfort measures Outcome: Progressing   Problem: Safety: Goal: Ability to remain free from injury will improve Outcome: Progressing   Problem: Skin Integrity: Goal: Risk for impaired skin integrity will decrease Outcome: Progressing

## 2022-06-09 NOTE — Progress Notes (Signed)
Mobility Specialist Progress Note   06/09/22 1400  Mobility  Activity Transferred from chair to bed  Assistive Device Four wheel walker   Patient transferred back to bed with mod A. Tolerated without complaint or incident. Was left in supine with all needs met, call bell in reach.   [place] Adryana Mogensen, [place], [place]  TDDUK:[phone] Office: [phone]

## 2022-06-09 NOTE — Progress Notes (Signed)
Physical Therapy Treatment Patient Details Name: Amal Saiki MRN: [id number] DOB: [birth year] Today's Date: 06/09/2022   History of Present Illness 56 y.o. male presented 05/26/22 with c/o severe and progressively worsening low back pain that radiates into his left buttock and down his LLE. Imaging +acute T12 compression fracture and  L4/5 there is multifactorial severe spinal stenosis and severe bilateral foraminal stenosis. Neurosurgery consult with no acute surgical intervention needed. IR consulted for possible kyphoplasty but has been delayed due to fever/SIRS and possible septic arthritis bil shoulder. 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    PT Comments    Pt making some progress today.  He still required assist of 2 and limited due to pain, but did tolerate some steps and was able to be positioned in chair.  Encouraged to sit in chair for a hour if able.  Continue plan of care.    Recommendations for follow up therapy are one component of a multi-disciplinary discharge planning process, led by the attending physician.  Recommendations may be updated based on patient status, additional functional criteria and insurance authorization.  Follow Up Recommendations  Acute inpatient rehab (3hours/day)     Assistance Recommended at Discharge    Patient can return home with the following A lot of help with walking and/or transfers;A lot of help with bathing/dressing/bathroom;Assistance with cooking/housework;Assist for transportation;Help with stairs or ramp for entrance   Equipment Recommendations  BSC/3in1    Recommendations for Other Services Rehab consult     Precautions / Restrictions Precautions Precautions: Fall;Back Precaution Booklet Issued: No Precaution Comments: reviewed BLT back rule for pt comfort Required Braces or Orthoses: Spinal Brace Spinal Brace:  Thoracolumbosacral orthotic;Applied in sitting position Restrictions Weight Bearing Restrictions: No     Mobility  Bed Mobility Overal bed mobility: Needs Assistance Bed Mobility: Rolling, Sidelying to Sit Rolling: Mod assist Sidelying to sit: Mod assist, +2 for physical assistance       General bed mobility comments: limited by pain; cues for log roll technique; guarding with transition to EOB to prevent sliding forward    Transfers Overall transfer level: Needs assistance Equipment used: None Transfers: Sit to/from Stand, Bed to chair/wheelchair/BSC Sit to Stand: Mod assist, +2 safety/equipment   Step pivot transfers: Mod assist, +2 physical assistance, +2 safety/equipment       General transfer comment: Transferred from EOB to recliner with HHA +2 mod assist and frequent cues    Ambulation/Gait Ambulation/Gait assistance: Mod assist Gait Distance (Feet): 5 Feet Assistive device: 2 person hand held assist Gait Pattern/deviations: Trunk flexed, Step-to pattern, Narrow base of support Gait velocity: decr     General Gait Details: Assist to steady and cues for safety; Fatigued easily; limited by pain   Stairs             Wheelchair Mobility    Modified Rankin (Stroke Patients Only)       Balance Overall balance assessment: Needs assistance, History of Falls Sitting-balance support: Feet supported, No upper extremity supported Sitting balance-Leahy Scale: Poor Sitting balance - Comments: min guard for safety   Standing balance support: Bilateral upper extremity supported, During functional activity Standing balance-Leahy Scale: Poor Standing balance comment: reliant on external support for balance                            Cognition Arousal/Alertness: Awake/alert Behavior During Therapy: [facility] for tasks  assessed/performed Overall Cognitive Status: Within Functional Limits for tasks assessed                                  General Comments: Patient seen with interpreter, Y Hin.        Exercises General Exercises - Lower Extremity Ankle Circles/Pumps: AROM, 20 reps, Both, Seated Long Arc Quad: AROM, 20 reps, Both, Seated Heel Slides: AROM, Both, 10 reps, Supine    General Comments General comments (skin integrity, edema, etc.): Increased time for positioning for comfort.  Pt incontinent upon standing.  Reports some numbness/decreased sensation in buttock when in bed.  Reports R LE cramping at times.      Pertinent Vitals/Pain Pain Assessment Pain Assessment: Faces Faces Pain Scale: Hurts whole lot Pain Location: BUE shoulders and BLEs Pain Descriptors / Indicators: Cramping, Discomfort, Grimacing, Guarding Pain Intervention(s): Limited activity within patient's tolerance, Monitored during session, Premedicated before session    Home Living                          Prior Function            PT Goals (current goals can now be found in the care plan section) Progress towards PT goals: Progressing toward goals    Frequency    Min 4X/week      PT Plan Current plan remains appropriate    Co-evaluation PT/OT/SLP Co-Evaluation/Treatment: Yes Reason for Co-Treatment: Other (comment) (pain control; limited tech time)   OT goals addressed during session: ADL's and self-care      AM-PAC PT "6 Clicks" Mobility   Outcome Measure  Help needed turning from your back to your side while in a flat bed without using bedrails?: A Little Help needed moving from lying on your back to sitting on the side of a flat bed without using bedrails?: A Lot Help needed moving to and from a bed to a chair (including a wheelchair)?: Total Help needed standing up from a chair using your arms (e.g., wheelchair or bedside chair)?: Total Help needed to walk in hospital room?: Total Help needed climbing 3-5 steps with a railing? : Total 6 Click Score: 9    End of Session Equipment Utilized During  Treatment: Back brace Activity Tolerance: Patient limited by pain Patient left: with chair alarm set;in chair;with call bell/phone within reach Nurse Communication: Mobility status PT Visit Diagnosis: Other abnormalities of gait and mobility (R26.89);Muscle weakness (generalized) (M62.81)     Time: [id number] PT Time Calculation (min) (ACUTE ONLY): 28 min  Charges:  $Therapeutic Activity: 8-22 mins                     Abran Richard, PT Acute Rehab [facility] [phone]    Karlton Lemon 06/09/2022, 1:47 PM

## 2022-06-09 NOTE — Progress Notes (Signed)
Inpatient Rehab Admissions Coordinator:  Note pt continues to be pain limited.  Currently does not appear to be able to tolerate the intensity of CIR and achieve supervision-Mod I goals. Will continue to follow from a distance for 1-2 more therapy sessions. However, TOC should look for other rehab options.   Gayland Curry, [place], [place] Admissions Coordinator [phone]

## 2022-06-09 NOTE — Plan of Care (Signed)
  Problem: Education: Goal: Knowledge of General Education information will improve Description: Including pain rating scale, medication(s)/side effects and non-pharmacologic comfort measures Outcome: Progressing   Problem: Health Behavior/Discharge Planning: Goal: Ability to manage health-related needs will improve Outcome: Progressing   Problem: Clinical Measurements: Goal: Ability to maintain clinical measurements within normal limits will improve Outcome: Progressing Goal: Diagnostic test results will improve Outcome: Progressing   Problem: Activity: Goal: Risk for activity intolerance will decrease Outcome: Progressing   Problem: Coping: Goal: Level of anxiety will decrease Outcome: Progressing   Problem: Elimination: Goal: Will not experience complications related to bowel motility Outcome: Progressing Goal: Will not experience complications related to urinary retention Outcome: Progressing   Problem: Pain Managment: Goal: General experience of comfort will improve Outcome: Progressing   Problem: Safety: Goal: Ability to remain free from injury will improve Outcome: Progressing   Problem: Skin Integrity: Goal: Risk for impaired skin integrity will decrease Outcome: Progressing   Problem: Education: Goal: Ability to describe self-care measures that may prevent or decrease complications (Diabetes Survival Skills Education) will improve Outcome: Progressing Goal: Individualized Educational Video(s) Outcome: Progressing   Problem: Coping: Goal: Ability to adjust to condition or change in health will improve Outcome: Progressing   Problem: Fluid Volume: Goal: Ability to maintain a balanced intake and output will improve Outcome: Progressing   Problem: Health Behavior/Discharge Planning: Goal: Ability to identify and utilize available resources and services will improve Outcome: Progressing Goal: Ability to manage health-related needs will improve Outcome:  Progressing   Problem: Metabolic: Goal: Ability to maintain appropriate glucose levels will improve Outcome: Progressing   Problem: Nutritional: Goal: Maintenance of adequate nutrition will improve Outcome: Progressing Goal: Progress toward achieving an optimal weight will improve Outcome: Progressing   Problem: Skin Integrity: Goal: Risk for impaired skin integrity will decrease Outcome: Progressing   Problem: Tissue Perfusion: Goal: Adequacy of tissue perfusion will improve Outcome: Progressing   

## 2022-06-09 NOTE — Progress Notes (Signed)
[facility] for Infectious Disease  Date of Admission:  05/26/2022           Reason for visit: Follow up on concern for discitis and septic arthritis  Current antibiotics: None  ASSESSMENT:    56 y.o. male admitted with:  #Less likely right shoulder septic arthritis #Less likely L4-5 discitis Patient initially presented with worsening back pain in the setting of T12 compression fracture and degenerative changes at L4-5 with MRI raising question of discitis although thought to be less likely until patient developed isolated fever and mild leukocytosis on 06/01/22.  Status post disc space aspiration 06/02/22 with negative cultures thus far.  Blood cultures also negative.  Further confounding the scenario is he has developed worsening right shoulder pain this admission as well as other joint pains.  An MRI was done 06/04/22 which raised high suspicion for septic arthritis and a large joint effusion.  Orthopedics attempted an aspiration but were unsuccessful but IR was able to successfully do so and fluid analysis was not consistent with infection.  Additionally, cultures from this are no growth finalized.  Antibiotics were discontinued on Friday and patient has remained afebrile since that time.   # T12 compression fracture Was planning for kyphoplasty with IR until developed fever and infectious work up was undertaken earlier this week.    # Diabetes A1c in July was 7.4.  RECOMMENDATIONS:    Continue to monitor off antibiotics given overall low suspicion for infection at this time Will sign off, please call as needed   Principal Problem:   Pyogenic arthritis of right shoulder region [facility]) Active Problems:   Type 2 diabetes mellitus without complication, without long-term current use of insulin (HCC)   Spinal stenosis of lumbar region with neurogenic claudication   T12 compression fracture (HCC)   Hyponatremia   AKI (acute kidney injury) ([place])   Iron deficiency anemia    Hypertension associated with diabetes ([place])   Hyperlipidemia associated with type 2 diabetes mellitus (HCC)   Mild cognitive impairment   Leukocytosis   Acute urinary retention   Thrush, oral   Right shoulder pain   Discitis of lumbar region    MEDICATIONS:    Scheduled Meds:  atorvastatin  40 mg Oral Daily   bethanechol  10 mg Oral TID   Chlorhexidine Gluconate Cloth  6 each Topical Daily   cholecalciferol  1,000 Units Oral Daily   donepezil  5 mg Oral QHS   feeding supplement (GLUCERNA SHAKE)  237 mL Oral Q24H   ferrous sulfate  325 mg Oral Q breakfast   gabapentin  400 mg Oral TID   insulin aspart  0-9 Units Subcutaneous TID WC   lidocaine  1 patch Transdermal Q24H   magnesium gluconate  500 mg Oral BID   metoprolol succinate  12.5 mg Oral Daily   nystatin  5 mL Oral QID   polyethylene glycol  17 g Oral BID   potassium chloride  40 mEq Oral Daily   senna-docusate  1 tablet Oral BID   tamsulosin  0.4 mg Oral Daily   Continuous Infusions:  methocarbamol (ROBAXIN) IV Stopped (05/30/22 1726)   PRN Meds:.bisacodyl, methocarbamol (ROBAXIN) IV, Muscle Rub, ondansetron **OR** ondansetron (ZOFRAN) IV, mouth rinse, oxyCODONE, simethicone, sodium chloride (PF), traMADol  SUBJECTIVE:   24 hour events:  Afebrile, Tmax 98.4 WBC normalized Shoulder aspiration cx negative No abx for 3 days  Patient continues to have shoulder pain and reports pain of other joints too  Review of Systems  All other systems reviewed and are negative.     OBJECTIVE:   Blood pressure 113/79, pulse 100, temperature 98 F (36.7 C), temperature source Oral, resp. rate 18, height 5' (1.524 m), weight 61.7 kg, SpO2 90 %. Body mass index is 26.57 kg/m.  Physical Exam Constitutional:      Appearance: Normal appearance.  HENT:     Head: Normocephalic and atraumatic.  Eyes:     Extraocular Movements: Extraocular movements intact.     Conjunctiva/sclera: Conjunctivae normal.  Pulmonary:      Effort: Pulmonary effort is normal. No respiratory distress.  Abdominal:     General: There is no distension.     Palpations: Abdomen is soft.  Musculoskeletal:     Cervical back: Normal range of motion and neck supple.     Comments: Pain with range of motion and tenderness of his right shoulder.  Tenderness of his left knee and left shoulder.  Neurological:     General: No focal deficit present.     Mental Status: He is alert and oriented to person, place, and time.  Psychiatric:        Mood and Affect: Mood normal.        Behavior: Behavior normal.      Lab Results: Lab Results  Component Value Date   WBC 9.8 06/09/2022   HGB 8.5 (L) 06/09/2022   HCT 25.7 (L) 06/09/2022   MCV 71.6 (L) 06/09/2022   PLT 526 (H) 06/09/2022    Lab Results  Component Value Date   NA 130 (L) 06/09/2022   K 4.2 06/09/2022   CO2 29 06/09/2022   GLUCOSE 118 (H) 06/09/2022   BUN 6 06/09/2022   CREATININE 0.90 06/09/2022   CALCIUM 9.4 06/09/2022   GFRNONAA >60 06/09/2022   GFRAA >90 07/03/2014    Lab Results  Component Value Date   ALT 6 06/05/2022   AST 14 (L) 06/05/2022   ALKPHOS 118 06/05/2022   BILITOT 0.7 06/05/2022       Component Value Date/Time   CRP 19.7 (H) 06/01/2022 1136       Component Value Date/Time   ESRSEDRATE 90 (H) 06/01/2022 1136     I have reviewed the micro and lab results in Epic.  Imaging: No results found.   Imaging independently reviewed in Epic.    Raynelle Highland for Infectious Disease [facility] [phone] pager 06/09/2022, 8:49 AM

## 2022-06-09 NOTE — NC FL2 (Signed)
[place] MEDICAID FL2 LEVEL OF CARE SCREENING TOOL     IDENTIFICATION  Patient Name: Wayne Stokes Birthdate: Dec 19, 1965 Sex: male Admission Date (Current Location): 05/26/2022  [facility] and [place] Number:  Producer, television/film/video and Address:  [facility] [place], [address], [place], [place] [address]      Provider Number: [id number]  Attending Physician Name and Address:  Lonia Blood, MD  Relative Name and Phone Number:  Jacqualin Combes   [phone]    Current Level of Care: Hospital Recommended Level of Care: Skilled Nursing Facility Prior Approval Number:    Date Approved/Denied:   PASRR Number:    Discharge Plan: SNF    Current Diagnoses: Patient Active Problem List   Diagnosis Date Noted   Pyogenic arthritis of right shoulder region [facility])    Discitis of lumbar region    Thrush, oral 06/01/2022   Right shoulder pain 06/01/2022   T12 compression fracture (HCC) 05/26/2022   Hyponatremia 05/26/2022   AKI (acute kidney injury) (HCC) 05/26/2022   Iron deficiency anemia 05/26/2022   Hypertension associated with diabetes (HCC) 05/26/2022   Hyperlipidemia associated with type 2 diabetes mellitus (HCC) 05/26/2022   Mild cognitive impairment 05/26/2022   Leukocytosis 05/26/2022   Acute urinary retention 05/26/2022   Thoracic compression fracture (HCC) 05/13/2022   Spinal stenosis of lumbar region with neurogenic claudication 05/13/2022   Type 2 diabetes mellitus without complication, without long-term current use of insulin (HCC) 03/17/2022    Orientation RESPIRATION BLADDER Height & Weight     Self, Time, Situation, Place  Normal Continent Weight: 136 lb 0.4 oz (61.7 kg) Height:  5' (152.4 cm)  BEHAVIORAL SYMPTOMS/MOOD NEUROLOGICAL BOWEL NUTRITION STATUS      Incontinent Diet (see discharge summary)  AMBULATORY STATUS COMMUNICATION OF NEEDS Skin   Limited Assist Verbally Other (Comment) (redness)                       Personal  Care Assistance Level of Assistance  Bathing, Feeding, Dressing Bathing Assistance: Maximum assistance Feeding assistance: Independent Dressing Assistance: Maximum assistance     Functional Limitations Info  Sight, Hearing, Speech Sight Info: Adequate Hearing Info: Adequate Speech Info: Adequate    SPECIAL CARE FACTORS FREQUENCY  PT (By licensed PT), OT (By licensed OT)     PT Frequency: 5x week OT Frequency: 5x week            Contractures Contractures Info: Not present    Additional Factors Info  Code Status, Allergies Code Status Info: full Allergies Info: Beef-derived Products, Shellfish Allergy, Eggs Or Egg-derived Products, Other   Insulin Sliding Scale Info: Novolog: see discharge summary       Current Medications (06/09/2022):  This is the current hospital active medication list Current Facility-Administered Medications  Medication Dose Route Frequency Provider Last Rate Last Admin   atorvastatin (LIPITOR) tablet 40 mg  40 mg Oral Daily Darreld Mclean R, MD   40 mg at 06/09/22 1038   bethanechol (URECHOLINE) tablet 10 mg  10 mg Oral TID Lonia Blood, MD   10 mg at 06/09/22 1039   bisacodyl (DULCOLAX) suppository 10 mg  10 mg Rectal Daily PRN Marland Mcalpine, Omair Latif, DO       Chlorhexidine Gluconate Cloth 2 % PADS 6 each  6 each Topical Daily Danford, Earl Lites, MD   6 each at 06/09/22 1038   cholecalciferol (VITAMIN D3) 25 MCG (1000 UNIT) tablet 1,000 Units  1,000 Units  Oral Daily Lavina Hamman, MD   1,000 Units at 06/09/22 1037   donepezil (ARICEPT) tablet 5 mg  5 mg Oral QHS Zada Finders R, MD   5 mg at 06/08/22 2226   feeding supplement (GLUCERNA SHAKE) (GLUCERNA SHAKE) liquid 237 mL  237 mL Oral Q24H Little Ishikawa, MD   237 mL at 06/09/22 1159   ferrous sulfate tablet 325 mg  325 mg Oral Q breakfast Edwin Dada, MD   325 mg at 06/09/22 0545   gabapentin (NEURONTIN) capsule 400 mg  400 mg Oral TID Raiford Noble Latif, DO   400 mg at  06/09/22 1038   insulin aspart (novoLOG) injection 0-9 Units  0-9 Units Subcutaneous TID WC SheikhGeorgina Quint [place], DO   1 Units at 06/09/22 1237   lidocaine (LIDODERM) 5 % 1 patch  1 patch Transdermal Q24H Raiford Noble [place], DO   1 patch at 06/09/22 1506   magnesium gluconate (MAGONATE) tablet 500 mg  500 mg Oral BID Joette Catching T, MD   500 mg at 06/09/22 1039   methocarbamol (ROBAXIN) tablet 500 mg  500 mg Oral Q8H PRN Joette Catching T, MD   500 mg at 06/09/22 1037   metoprolol succinate (TOPROL-XL) 24 hr tablet 12.5 mg  12.5 mg Oral Daily Joette Catching T, MD   12.5 mg at 06/09/22 1039   morphine (MS CONTIN) 12 hr tablet 15 mg  15 mg Oral Q12H Joette Catching T, MD   15 mg at 06/09/22 1450   Muscle Rub CREA 1 Application  1 Application Topical PRN Raiford Noble Latif, DO   1 Application at 38/18/29 0039   ondansetron (ZOFRAN) tablet 4 mg  4 mg Oral Q6H PRN Lenore Cordia, MD       Oral care mouth rinse  15 mL Mouth Rinse PRN Cherene Altes, MD       oxyCODONE (Oxy IR/ROXICODONE) immediate release tablet 10 mg  10 mg Oral Q4H PRN Joette Catching T, MD   10 mg at 06/09/22 1038   polyethylene glycol (MIRALAX / GLYCOLAX) packet 17 g  17 g Oral BID Raiford Noble [place], DO   17 g at 06/08/22 2226   potassium chloride SA (KLOR-CON M) CR tablet 40 mEq  40 mEq Oral Daily Joette Catching T, MD   40 mEq at 06/09/22 1038   senna-docusate (Senokot-S) tablet 1 tablet  1 tablet Oral BID Raiford Noble [place], DO   1 tablet at 06/08/22 2225   simethicone (MYLICON) chewable tablet 80 mg  80 mg Oral QID PRN Vianne Bulls, MD   80 mg at 06/02/22 0451   sodium chloride (PF) 0.9 % injection 10 mL  10 mL Other PRN Mignon Pine, DO       tamsulosin (FLOMAX) capsule 0.4 mg  0.4 mg Oral Daily Little Ishikawa, MD   0.4 mg at 06/09/22 1037   traMADol (ULTRAM) tablet 50 mg  50 mg Oral Q6H PRN Cherene Altes, MD   50 mg at 06/09/22 1221     Discharge Medications: Please see discharge  summary for a list of discharge medications.  Relevant Imaging Results:  Relevant Lab Results:   Additional Information SS# [id number]  Joanne Chars, LCSW

## 2022-06-09 NOTE — Progress Notes (Signed)
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  Orthopedics and Neurosurgery have evaluated the patient and recommended TLSO brace and pain control.  IR was consulted for kyphoplasty.  On 9/24 the patient developed leukocytosis with a fever.  Consultants:  Interventional Radiology Orthopedics Neurosurgery  Goals of Care:  Code Status: Full Code   DVT prophylaxis: SCDs  Interim Hx: No acute events reported overnight.  Remains afebrile.  Vital signs stable.  WBC normal.  Spoke with patient at length via interpreter at bedside.  He is sitting up in a chair today.  He continues to have difficulty with urinary retention.  He reports ongoing left greater than right shoulder pain.  I have stressed to him the importance of getting up and moving.  Assessment & Plan:  Lumbar spinal stenosis L4-L5 - T12 compression fracture Presented with severe back pain and ambulatory dysfunction - MRI revealed no evidence of cord injury - no surgical intervention suggested by Neurosurgery -TLSO brace for pain - IR was following for possible T12 kyphoplasty but this was delayed when patient developed fever  FUO - SIRS - right shoulder pain - left shoulder pain  9/24 patient developed leukocytosis tachycardia and fever to 102.2 -CXR was unremarkable -COVID-negative -cultures negative -MRI of the spine 9/18 raised concern of infection/discitis at the L4 level - aspiration at this level completed 9/25 in IR with empiric IV vancomycin and ceftriaxone being dosed after sampling - culture from spinal aspiration negative - ongoing pain in right shoulder  prompted MRI which reported findings "highly suggestive of septic arthritis" - Orthopedics aspirated shoulder 9/27 with no fluid present - IR aspirated R shoulder joint 9/29 with no findings on fluid studies suggestive of infection -ID following -antibiotics and scheduled Tylenol discontinued -clinically there is no persisting evidence of an active infection -he will remain off antibiotics -consider steroid injection in the shoulder if pain persists -check uric acid - patient must participate in therapy to a greater extent as presently he is refusing to do much more than to lay in bed  Microcytic iron deficient anemia Continue oral iron supplementation -no evidence of active bleeding -suspect this represents anemia of chronic inflammation/chronic disease -transfused 1 unit PRBC 9/28 - iron studies consistent with anemia related to chronic inflammation/poor nutrition with element of iron deficiency -hemoglobin holding steady  Recent Labs  Lab 06/06/22 0246 06/07/22 0302 06/08/22 0358 06/09/22 0149  HGB 9.2* 8.0* 8.3* 8.5*    Acute urinary retention with acute kidney injury Felt to be due to pain and lack of mobility - baseline creatinine 1.1 with creatinine 1.42 at presentation - ARB and NSAIDs on hold - failed voiding trial - Flomax initiated -renal function has normalized - retry voiding trial ongoing with catheter removed 10/1 but with patient thus far exhibiting recurrent retention  Constipation Due to immobility -continue bowel regimen  Thrush Noted 9/24 -resolved with use of nystatin  Vitamin D deficiency Vitamin D level 27.04 - supplementation ongoing   Hyponatremia Sodium stable  Hypomagnesemia Corrected w/ supplementation   Hypokalemia Has normalized with supplementation  DM 2 A1c 7.11 March 2022 -CBG well controlled   HTN Blood pressure well controlled  Mild cognitive impairment Continue donepezil  HLD Continue atorvastatin  Family Communication: No family  present at time of exam Disposition: Inpatient status to continue   Objective: Blood pressure 113/79, pulse 100, temperature 98 F (36.7 C), temperature source Oral, resp. rate 18, height 5' (1.524 m), weight 61.7 kg, SpO2 90 %.  Intake/Output Summary (Last 24 hours) at 06/09/2022 0922 Last data filed at 06/09/2022 0259 Gross per 24 hour  Intake 240 ml  Output 1500 ml  Net -1260 ml    Filed Weights   06/04/22 0315 06/06/22 0500 06/07/22 0500  Weight: 60 kg 62.8 kg 61.7 kg    Examination: General: No acute respiratory distress Lungs: Clear to auscultation bilaterally without wheezing Cardiovascular: Regular rate and rhythm without murmur  Abdomen: Nontender, nondistended, soft, bowel sounds positive, no rebound Extremities: No C/C/E B lower extremities  CBC: Recent Labs  Lab 06/03/22 0141 06/04/22 0542 06/04/22 1242 06/07/22 0302 06/08/22 0358 06/09/22 0149  WBC 7.3 7.8   < > 9.6 10.6* 9.8  NEUTROABS 5.0 5.1  --   --   --   --   HGB 7.3* 6.8*   < > 8.0* 8.3* 8.5*  HCT 23.0* 20.2*   < > 24.6* 24.9* 25.7*  MCV 71.4* 69.9*   < > 73.2* 71.6* 71.6*  PLT 283 248   < > 404* 467* 526*   < > = values in this interval not displayed.    Basic Metabolic Panel: Recent Labs  Lab 06/06/22 0246 06/07/22 0302 06/08/22 0358 06/09/22 0149  NA 135 135 132* 130*  K 4.1 4.3 3.6 4.2  CL 96* 98 93* 92*  CO2 28 27 27 29   GLUCOSE 105* 69* 91 118*  BUN <5* 5* <5* 6  CREATININE 0.73 0.69 0.80 0.90  CALCIUM 9.2 9.3 9.2 9.4  MG 1.7 1.4* 2.2  --     GFR: Estimated Creatinine Clearance: 70.1 mL/min (by C-G formula based on SCr of 0.9 mg/dL).   Scheduled Meds:  atorvastatin  40 mg Oral Daily   bethanechol  10 mg Oral TID   Chlorhexidine Gluconate Cloth  6 each Topical Daily   cholecalciferol  1,000 Units Oral Daily   donepezil  5 mg Oral QHS   feeding supplement (GLUCERNA SHAKE)  237 mL Oral Q24H   ferrous sulfate  325 mg Oral Q breakfast   gabapentin  400 mg Oral TID    insulin aspart  0-9 Units Subcutaneous TID WC   lidocaine  1 patch Transdermal Q24H   magnesium gluconate  500 mg Oral BID   metoprolol succinate  12.5 mg Oral Daily   nystatin  5 mL Oral QID   polyethylene glycol  17 g Oral BID   potassium chloride  40 mEq Oral Daily   senna-docusate  1 tablet Oral BID   tamsulosin  0.4 mg Oral Daily     LOS: 14 days   Cherene Altes, MD [facility] Office  [phone] Pager - Text Page per Shea Evans  If 7PM-7AM, please contact night-coverage per Amion 06/09/2022, 9:22 AM

## 2022-06-09 NOTE — Progress Notes (Signed)
  RE:  Wayne Stokes       Date of Birth:  02/06/1966     Date:   06/09/22       To Whom It May Concern:  Please be advised that the above-named patient will require a short-term nursing home stay - anticipated 30 days or less for rehabilitation and strengthening.  The plan is for return home.                 MD signature                Date

## 2022-06-10 ENCOUNTER — Ambulatory Visit: Payer: No Typology Code available for payment source | Admitting: Orthopaedic Surgery

## 2022-06-10 LAB — GLUCOSE, CAPILLARY
Glucose-Capillary: 98 mg/dL (ref 70–99)
Glucose-Capillary: 132 mg/dL — ABNORMAL HIGH (ref 70–99)
Glucose-Capillary: 171 mg/dL — ABNORMAL HIGH (ref 70–99)
Glucose-Capillary: 152 mg/dL — ABNORMAL HIGH (ref 70–99)

## 2022-06-10 MED ORDER — GLUCERNA SHAKE PO LIQD
237.0000 mL | Freq: Three times a day (TID) | ORAL | Status: DC
  Administered 2022-06-10 – 2022-07-01 (×42): 237 mL via ORAL
  Filled 2022-06-10 (×4): qty 237

## 2022-06-10 MED ORDER — GABAPENTIN 100 MG PO CAPS
200.0000 mg | ORAL_CAPSULE | Freq: Three times a day (TID) | ORAL | Status: DC
  Administered 2022-06-10 – 2022-07-19 (×102): 200 mg via ORAL
  Filled 2022-06-10 (×110): qty 2

## 2022-06-10 NOTE — Progress Notes (Signed)
Bladder scan showed >650-in/out cath netted 862ml- notified [facility] on call orders recieved

## 2022-06-10 NOTE — Progress Notes (Addendum)
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  Orthopedics and Neurosurgery have evaluated the patient and recommended TLSO brace and pain control.  IR was consulted for kyphoplasty.  On 9/24 the patient developed leukocytosis with a fever.  Consultants:  Interventional Radiology Orthopedics Neurosurgery  Goals of Care:  Code Status: Full Code   DVT prophylaxis: SCDs  Interim Hx: Afebrile.  Mild tachycardia noted intermittently.  Vital signs otherwise stable.  Reports persistent bilateral shoulder pain and now complaining of knee pain as well.  No acute changes in joints on exam.  No respiratory distress.  Assessment & Plan:  Lumbar spinal stenosis L4-L5 - T12 compression fracture Presented with severe back pain and ambulatory dysfunction - MRI revealed no evidence of cord injury - no surgical intervention suggested by Neurosurgery -TLSO brace for pain - IR was following for possible T12 kyphoplasty but this was cancelled when patient developed fever -I would not pursue this further at present given the complexity and uncertainty of his current condition  FUO - SIRS - right shoulder pain - left shoulder pain  9/24 patient developed leukocytosis tachycardia and fever to 102.2 - CXR was unremarkable - COVID-negative - cultures negative - MRI of the spine 9/18 raised concern of infection/discitis at the L4 level - aspiration at this level completed 9/25 in IR with empiric IV vancomycin and ceftriaxone being dosed after sampling - culture from spinal aspiration negative - ongoing pain in right shoulder prompted MRI which  reported findings "highly suggestive of septic arthritis" - Orthopedics aspirated shoulder 9/27 with no fluid present - IR aspirated R shoulder joint 9/29 with no findings on fluid studies suggestive of infection - ID evaluated - antibiotics and scheduled Tylenol discontinued - clinically there is no persisting evidence of an active infection - he will remain off antibiotics - consider steroid injection in the shoulder or a short trial of systemic steroid if pain persists - uric acid not elevated - patient must participate in therapy to a greater extent as he is frequently refusing to do much more than to lay in bed -low-dose MS Contin initiated 10/2 in hopes of achieving better background pain control  Microcytic iron deficient anemia Continue oral iron supplementation -no evidence of active bleeding -suspect this represents anemia of chronic inflammation/chronic disease -transfused 1 unit PRBC 9/28 - iron studies consistent with anemia related to chronic inflammation/poor nutrition with element of iron deficiency -hemoglobin holding steady  Recent Labs  Lab 06/06/22 0246 06/07/22 0302 06/08/22 0358 06/09/22 0149  HGB 9.2* 8.0* 8.3* 8.5*    Acute urinary retention with acute kidney injury Felt to be due to pain and lack of mobility - baseline creatinine 1.1 with creatinine 1.42 at presentation - ARB and NSAIDs on hold - failed voiding trial - Flomax initiated -renal function has normalized - retry voiding trial ongoing with catheter removed 10/1 but with patient thus far exhibiting recurrent retention  Constipation Due to immobility -continue bowel regimen  Thrush Noted 9/24 -resolved with use of nystatin  Vitamin D deficiency Vitamin D level 27.04 - supplementation ongoing   Hyponatremia Sodium stable  Hypomagnesemia Corrected w/ supplementation   Hypokalemia Has normalized with  supplementation  DM 2 A1c 7.11 March 2022 -CBG well controlled   HTN Blood pressure well  controlled  Mild cognitive impairment Continue donepezil  HLD Continue atorvastatin  Family Communication: No family present at time of exam Disposition: Inpatient status to continue -unless the patient develops a recurrent fever or other clear signs of infection he will be ready for disposition in approximately 24-48 hours   Objective: Blood pressure (!) 112/92, pulse (!) 102, temperature 99.9 F (37.7 C), temperature source Oral, resp. rate 19, height 5' (1.524 m), weight 61.7 kg, SpO2 96 %.  Intake/Output Summary (Last 24 hours) at 06/10/2022 0926 Last data filed at 06/10/2022 0850 Gross per 24 hour  Intake --  Output 1450 ml  Net -1450 ml    Filed Weights   06/04/22 0315 06/06/22 0500 06/07/22 0500  Weight: 60 kg 62.8 kg 61.7 kg    Examination: General: No acute respiratory distress Lungs: Clear to auscultation bilaterally without wheezing Cardiovascular: Regular rate and rhythm without murmur  Abdomen: Nontender, nondistended, soft, bowel sounds positive, no rebound Extremities: No C/C/E B lower extremities  CBC: Recent Labs  Lab 06/04/22 0542 06/04/22 1242 06/07/22 0302 06/08/22 0358 06/09/22 0149  WBC 7.8   < > 9.6 10.6* 9.8  NEUTROABS 5.1  --   --   --   --   HGB 6.8*   < > 8.0* 8.3* 8.5*  HCT 20.2*   < > 24.6* 24.9* 25.7*  MCV 69.9*   < > 73.2* 71.6* 71.6*  PLT 248   < > 404* 467* 526*   < > = values in this interval not displayed.    Basic Metabolic Panel: Recent Labs  Lab 06/06/22 0246 06/07/22 0302 06/08/22 0358 06/09/22 0149  NA 135 135 132* 130*  K 4.1 4.3 3.6 4.2  CL 96* 98 93* 92*  CO2 28 27 27 29   GLUCOSE 105* 69* 91 118*  BUN <5* 5* <5* 6  CREATININE 0.73 0.69 0.80 0.90  CALCIUM 9.2 9.3 9.2 9.4  MG 1.7 1.4* 2.2  --     GFR: Estimated Creatinine Clearance: 70.1 mL/min (by C-G formula based on SCr of 0.9 mg/dL).   Scheduled Meds:  atorvastatin  40 mg Oral Daily   bethanechol  10 mg Oral TID   Chlorhexidine Gluconate Cloth  6  each Topical Daily   cholecalciferol  1,000 Units Oral Daily   donepezil  5 mg Oral QHS   feeding supplement (GLUCERNA SHAKE)  237 mL Oral Q24H   ferrous sulfate  325 mg Oral Q breakfast   gabapentin  400 mg Oral TID   insulin aspart  0-9 Units Subcutaneous TID WC   lidocaine  1 patch Transdermal Q24H   magnesium gluconate  500 mg Oral BID   metoprolol succinate  12.5 mg Oral Daily   morphine  15 mg Oral Q12H   polyethylene glycol  17 g Oral BID   potassium chloride  40 mEq Oral Daily   senna-docusate  1 tablet Oral BID   tamsulosin  0.4 mg Oral Daily     LOS: 15 days   Cherene Altes, MD [facility] Office  [phone] Pager - Text Page per Shea Evans  If 7PM-7AM, please contact night-coverage per Amion 06/10/2022, 9:26 AM

## 2022-06-10 NOTE — TOC Progression Note (Signed)
Transition of Care [facility]) - Progression Note    Patient Details  Name: Wayne Stokes MRN: [id number] Date of Birth: [birth year]  Transition of Care [facility] CM/SW Contact  Joanne Chars, LCSW Phone Number: 06/10/2022, 8:36 AM  Clinical Narrative:   level 2 passr docs uploaded to [place].    Expected Discharge Plan: [place] Barriers to Discharge: Continued Medical Work up  Expected Discharge Plan and Services Expected Discharge Plan: [place] In-house Referral: Development worker, community Discharge Planning Services: CM Consult                                           Social Determinants of Health (SDOH) Interventions    Readmission Risk Interventions     No data to display

## 2022-06-10 NOTE — Progress Notes (Signed)
Mobility Specialist Progress Note   06/10/22 0915  Mobility  Activity Refused mobility   Will f/u later this afternoon as time permits.    [place] Michelyn Scullin, [place], [place]  NTIRW:[phone] Office: [phone]

## 2022-06-10 NOTE — Progress Notes (Signed)
Nutrition Follow-up  DOCUMENTATION CODES:   Not applicable  INTERVENTION:  Continue current diet as ordered Encouraged adequate PO intake Increase Glucerna Shake po from once daily to TID, each supplement provides 220 kcal and 10 grams of protein Obtained meal preferences and placed in [employer] [facility] ordering system  NUTRITION DIAGNOSIS:   Increased nutrient needs related to post-op healing as evidenced by other (comment) (Compression fracture).  ongoing  GOAL:   Patient will meet greater than or equal to 90% of their needs  unmet  MONITOR:   PO intake  REASON FOR ASSESSMENT:   Consult Assessment of nutrition requirement/status  ASSESSMENT:   56 y.o. male admits related to evaluation of worsening lower back pain. PMH includes: T2DM, HTN, anemia, AKI. 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  Per MD, pt with persistent shoulder pain. Consider steroid injection if pain remains ongoing.   Listed food allergies: beef products, shellfish, egg/egg derived products, eggplant, pizza  Spoke with pt utilizing interpreter via phone. Pt reports that he had been eating well initially but more recently has not been eating well d/t meal times and not enjoying eating the same foods each day. He was requesting more fruits and vegetables with his meals in addition to soup. He has been accepting most Glucerna shakes daily. Uncertain how much he has been consuming. Pt had consumed about half of supplement from this morning at time of visit. Will increase frequency of Glucerna to optimize nutritional intake.    Pt noted to have been treated for thrush. No current complaints r/t to this.   Meal completions: 9/28: 0% breakfast, 25% lunch, 50% dinner 10/1: 50% breakfast 10/3: 75% breakfast  Medications: vitamin D3, ferrous sulfate, SSI 0-9 units TID, magnesium sulfate, miralax (not  given), klor-con, senna  Labs: CBG's 98-132 x24 hours  Diet Order:   Diet Order             Diet regular Room service appropriate? Yes; Fluid consistency: Thin  Diet effective now                   EDUCATION NEEDS:   Education needs have been addressed  Skin:  Skin Assessment: Reviewed RN Assessment  Last BM:  10/2 (type 7- large)  Height:   Ht Readings from Last 1 Encounters:  06/01/22 5' (1.524 m)    Weight:   Wt Readings from Last 1 Encounters:  06/07/22 61.7 kg    Ideal Body Weight:  48.2 kg  BMI:  Body mass index is 26.57 kg/m.  Estimated Nutritional Needs:   Kcal:  1600-1800  Protein:  80-95g  Fluid:  >/=1.6L  Wayne Stokes, RDN, LDN Clinical Nutrition

## 2022-06-10 NOTE — Progress Notes (Addendum)
Physical Therapy Treatment Patient Details Name: Wayne Stokes MRN: [id number] DOB: 10/14/1965 Today's Date: 06/10/2022   History of Present Illness 56 y.o. male presented 05/26/22 with c/o severe and progressively worsening low back pain that radiates into his left buttock and down his LLE. Imaging +acute T12 compression fracture and  L4/5 there is multifactorial severe spinal stenosis and severe bilateral foraminal stenosis. Neurosurgery consult with no acute surgical intervention needed. IR consulted for possible kyphoplasty but has been delayed due to fever/SIRS and possible septic arthritis bil shoulder. 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    PT Comments    Pt remains limited by pain.  He had c/o bil shoulder pain and bil LE cramping R >L.  However, pt had new c/o severe knee pain in standing that stopped him from walking and legs slowly buckling requiring assist to get to chair. Reports pain in knees similar to shoulders.  Pt was mod A of 1 for transfers - notified RN. Continue plan of care as pain allows, pt expected to progress well once pain controlled.    Recommendations for follow up therapy are one component of a multi-disciplinary discharge planning process, led by the attending physician.  Recommendations may be updated based on patient status, additional functional criteria and insurance authorization.  Follow Up Recommendations  Acute inpatient rehab (3hours/day) Can patient physically be transported by private vehicle: No   Assistance Recommended at Discharge Frequent or constant Supervision/Assistance  Patient can return home with the following A lot of help with walking and/or transfers;A lot of help with bathing/dressing/bathroom;Assistance with cooking/housework;Assist for transportation;Help with stairs or ramp for entrance   Equipment Recommendations   BSC/3in1;Rolling walker (2 wheels)    Recommendations for Other Services Rehab consult     Precautions / Restrictions Precautions Precautions: Fall;Back Precaution Booklet Issued: No Precaution Comments: reviewed BLT back rule for pt comfort Required Braces or Orthoses: Spinal Brace Spinal Brace: Thoracolumbosacral orthotic;Applied in sitting position     Mobility  Bed Mobility Overal bed mobility: Needs Assistance Bed Mobility: Rolling, Sidelying to Sit Rolling: Mod assist Sidelying to sit: Mod assist       General bed mobility comments: limited by pain; cues for log roll technique    Transfers Overall transfer level: Needs assistance Equipment used: Rolling walker (2 wheels) Transfers: Sit to/from Stand Sit to Stand: Mod assist   Step pivot transfers: Mod assist       General transfer comment: Cues for hand placement and mod A to rise.  Pt took 3-4 steps forward and then froze with knee pain. severe moaning, slow buckling, requiring assist to pivot and get to chair.    Ambulation/Gait Ambulation/Gait assistance: Mod assist Gait Distance (Feet): 2 Feet Assistive device: Rolling walker (2 wheels) Gait Pattern/deviations: Step-to pattern Gait velocity: decr     General Gait Details: Assist to steady and cues for safety; Fatigued easily; limited by pain   Stairs             Wheelchair Mobility    Modified Rankin (Stroke Patients Only)       Balance Overall balance assessment: Needs assistance, History of Falls Sitting-balance support: Feet supported, Bilateral upper extremity supported Sitting balance-Leahy Scale: Poor Sitting balance - Comments: min guard for safety   Standing balance support: Bilateral upper extremity supported, During functional activity Standing balance-Leahy Scale: Poor Standing balance comment: reliant on external support for balance  Cognition Arousal/Alertness:  Awake/alert Behavior During Therapy: WFL for tasks assessed/performed Overall Cognitive Status: Within Functional Limits for tasks assessed                                 General Comments: Patient seen with interpreter, Y Hin on phone        Exercises General Exercises - Lower Extremity Ankle Circles/Pumps: AROM, 20 reps, Both, Seated Long Arc Quad: AROM, 20 reps, Both, Seated Heel Slides: AROM, Both, 10 reps, Supine Other Exercises Other Exercises: Pt moving LE to help with cramps    General Comments General comments (skin integrity, edema, etc.): Increased time for positioning and interpretation.  Pt difficult to follow conversation at times due to interpretor on speaker phone and pt frequently moaning.      Pertinent Vitals/Pain Pain Assessment Pain Assessment: 0-10 Pain Score: 10-Worst pain ever Pain Location: Bil Shoulders with movement; Bil thighs cramping R > L; and new c/o bil knee pain similar to shoulders when standing Pain Descriptors / Indicators: Cramping, Discomfort, Grimacing, Guarding, Moaning Pain Intervention(s): Limited activity within patient's tolerance, Monitored during session, Premedicated before session, RN gave pain meds during session, Repositioned, Other (comment) (tried heat pack for cramps but pt did not like)    Home Living                          Prior Function            PT Goals (current goals can now be found in the care plan section) Progress towards PT goals: Progressing toward goals (but limited due to pain)    Frequency    Min 4X/week      PT Plan Current plan remains appropriate    Co-evaluation              AM-PAC PT "6 Clicks" Mobility   Outcome Measure  Help needed turning from your back to your side while in a flat bed without using bedrails?: A Little Help needed moving from lying on your back to sitting on the side of a flat bed without using bedrails?: A Lot Help needed moving to and  from a bed to a chair (including a wheelchair)?: A Lot Help needed standing up from a chair using your arms (e.g., wheelchair or bedside chair)?: A Lot Help needed to walk in hospital room?: Total Help needed climbing 3-5 steps with a railing? : Total 6 Click Score: 11    End of Session Equipment Utilized During Treatment: Back brace Activity Tolerance: Patient limited by pain Patient left: with chair alarm set;in chair;with call bell/phone within reach (encouraged to sit for hour if able (notified RN)) Nurse Communication: Mobility status PT Visit Diagnosis: Other abnormalities of gait and mobility (R26.89);Muscle weakness (generalized) (M62.81)     Time: 9323-5573 PT Time Calculation (min) (ACUTE ONLY): 36 min  Charges:  $Therapeutic Activity: 23-37 mins                     Anise Salvo, PT Acute Rehab [facility] [phone]    Rayetta Humphrey 06/10/2022, 11:00 AM

## 2022-06-11 LAB — CBC
HCT: 24.9 % — ABNORMAL LOW (ref 39.0–52.0)
Hemoglobin: 8.3 g/dL — ABNORMAL LOW (ref 13.0–17.0)
MCH: 24.1 pg — ABNORMAL LOW (ref 26.0–34.0)
MCHC: 33.3 g/dL (ref 30.0–36.0)
MCV: 72.2 fL — ABNORMAL LOW (ref 80.0–100.0)
Platelets: 635 10*3/uL — ABNORMAL HIGH (ref 150–400)
RBC: 3.45 MIL/uL — ABNORMAL LOW (ref 4.22–5.81)
RDW: 18.6 % — ABNORMAL HIGH (ref 11.5–15.5)
WBC: 9.8 10*3/uL (ref 4.0–10.5)
nRBC: 0 % (ref 0.0–0.2)

## 2022-06-11 LAB — GLUCOSE, CAPILLARY
Glucose-Capillary: 126 mg/dL — ABNORMAL HIGH (ref 70–99)
Glucose-Capillary: 131 mg/dL — ABNORMAL HIGH (ref 70–99)
Glucose-Capillary: 165 mg/dL — ABNORMAL HIGH (ref 70–99)
Glucose-Capillary: 170 mg/dL — ABNORMAL HIGH (ref 70–99)

## 2022-06-11 LAB — BASIC METABOLIC PANEL
Anion gap: 10 (ref 5–15)
BUN: 7 mg/dL (ref 6–20)
CO2: 30 mmol/L (ref 22–32)
Calcium: 10.6 mg/dL — ABNORMAL HIGH (ref 8.9–10.3)
Chloride: 89 mmol/L — ABNORMAL LOW (ref 98–111)
Creatinine, Ser: 0.85 mg/dL (ref 0.61–1.24)
GFR, Estimated: >60 mL/min (ref >60)
Glucose, Bld: 126 mg/dL — ABNORMAL HIGH (ref 70–99)
Potassium: 4 mmol/L (ref 3.5–5.1)
Sodium: 129 mmol/L — ABNORMAL LOW (ref 135–145)

## 2022-06-11 NOTE — Progress Notes (Signed)
Physical Therapy Treatment Patient Details Name: Wayne Stokes MRN: [id number] DOB: [birth year] Today's Date: 06/11/2022   History of Present Illness 56 y.o. male presented 05/26/22 with c/o severe and progressively worsening low back pain that radiates into his left buttock and down his LLE. Imaging +acute T12 compression fracture and  L4/5 there is multifactorial severe spinal stenosis and severe bilateral foraminal stenosis. Neurosurgery consult with no acute surgical intervention needed. IR consulted for possible kyphoplasty but has been delayed due to fever/SIRS and possible septic arthritis bil shoulder. 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    PT Comments    Continuing work on functional mobility and activity tolerance;  Session focused on safe OOB transfers, given knees buckling last PT session; Y Hin, Medical Interpreter, was present during session and facilitated communication; Upon entry, pt was supine in bed; was able to perform bil straight leg raises in supine, getting to approx 90 degrees flexion of each hip; Less pain observed with log roll and sidelie to sit this session; Used Stedy to safely help pt OOB to the recliner wit good success; Pain is often the limiting factor in PT sessions; Extra time to position pt as comfortably as possible in the recliner; Very slow and variable progress throughout hospital stay; Original PT goals not met; downgraded goals see PT care plan  Recommendations for follow up therapy are one component of a multi-disciplinary discharge planning process, led by the attending physician.  Recommendations may be updated based on patient status, additional functional criteria and insurance authorization.  Follow Up Recommendations  Acute inpatient rehab (3hours/day) Can patient physically be transported by private vehicle: No   Assistance Recommended at  Discharge Frequent or constant Supervision/Assistance  Patient can return home with the following A lot of help with walking and/or transfers;A lot of help with bathing/dressing/bathroom;Assistance with cooking/housework;Assist for transportation;Help with stairs or ramp for entrance   Equipment Recommendations  BSC/3in1;Rolling walker (2 wheels)    Recommendations for Other Services       Precautions / Restrictions Precautions Precautions: Fall;Back Precaution Comments: reviewed BLT back rule for pt comfort Required Braces or Orthoses: Spinal Brace Spinal Brace: Thoracolumbosacral orthotic;Applied in sitting position     Mobility  Bed Mobility Overal bed mobility: Needs Assistance Bed Mobility: Rolling, Sidelying to Sit Rolling: Mod assist Sidelying to sit: Mod assist       General bed mobility comments: limited by pain; cues for log roll technique    Transfers Overall transfer level: Needs assistance Equipment used: Ambulation equipment used Transfers: Sit to/from Stand Sit to Stand: Mod assist, Min assist           General transfer comment: Light mod assist to stand from bed to stedy; pt able to stand from hig stedy seat with min assist Transfer via Lift Equipment: Stedy  Ambulation/Gait                   Stairs             Wheelchair Mobility    Modified Rankin (Stroke Patients Only)       Balance     Sitting balance-Leahy Scale: Poor Sitting balance - Comments: min guard for safety     Standing balance-Leahy Scale: Poor Standing balance comment: reliant on external support for balance  Cognition Arousal/Alertness: Awake/alert Behavior During Therapy: WFL for tasks assessed/performed Overall Cognitive Status: Within Functional Limits for tasks assessed (for simple tasks)                                 General Comments: Patient seen with interpreter, Y Hin present         Exercises Other Exercises Other Exercises: serial sit to stands from high seat in stedyx4    General Comments General comments (skin integrity, edema, etc.): Increased time for positioning and interpretation.  Pt difficult to follow conversation at times due to interpretation and pt frequently moaning.      Pertinent Vitals/Pain Pain Assessment Pain Assessment: 0-10 Faces Pain Scale: Hurts whole lot Pain Location: Bil Shoulders with movement; Bil thighs cramping R > L; and new c/o bil knee pain similar to shoulders when standing Pain Descriptors / Indicators: Cramping, Discomfort, Grimacing, Guarding, Moaning Pain Intervention(s): Monitored during session, Repositioned    Home Living                          Prior Function            PT Goals (current goals can now be found in the care plan section) Acute Rehab PT Goals Patient Stated Goal: Agreeable to gettingup PT Goal Formulation: With patient Time For Goal Achievement: 06/25/22 Potential to Achieve Goals: Fair Progress towards PT goals: Not progressing toward goals - comment;Goals downgraded-see care plan (decr activity tolerance due to pain)    Frequency    Min 4X/week      PT Plan Current plan remains appropriate    Co-evaluation              AM-PAC PT "6 Clicks" Mobility   Outcome Measure  Help needed turning from your back to your side while in a flat bed without using bedrails?: A Little Help needed moving from lying on your back to sitting on the side of a flat bed without using bedrails?: A Lot Help needed moving to and from a bed to a chair (including a wheelchair)?: A Lot Help needed standing up from a chair using your arms (e.g., wheelchair or bedside chair)?: A Lot Help needed to walk in hospital room?: Total Help needed climbing 3-5 steps with a railing? : Total 6 Click Score: 11    End of Session Equipment Utilized During Treatment: Back brace Activity Tolerance: Patient  limited by pain Patient left: in chair;with chair alarm set;with call bell/phone within reach Nurse Communication: Mobility status PT Visit Diagnosis: Other abnormalities of gait and mobility (R26.89);Muscle weakness (generalized) (M62.81)     Time: 2263-3354 PT Time Calculation (min) (ACUTE ONLY): 34 min  Charges:  $Therapeutic Activity: 23-37 mins                     Roney Marion, PT  Acute Rehabilitation Services Office [phone]    Colletta Maryland 06/11/2022, 6:14 PM

## 2022-06-11 NOTE — Congregational Nurse Program (Signed)
Hospital visit with interpreter [facility].  Patient appeared to be more comfortable today and able to move around in bed without c/o pain.  We took his mail which was picked up from his apartment and called [name] caseworker [name] about a letter he received from [facility] regarding his disability .  She will visit him at hospital tomorrow to help him call SSA as requested on letter.  [name] RN, Congregational Nurse [phone]

## 2022-06-11 NOTE — Progress Notes (Signed)
PROGRESS NOTE  Wayne Stokes  WVP:[id number] DOB: [birth year] DOA: 05/26/2022 PCP: [name], NP   Brief Narrative:  Patient is a 56 year old male from [place] with history of diabetes type 2, hypertension, lumbar disc degenerative disease with spinal stenosis/ claudication, T12 vertebral compression fracture, mild cognitive impairment who was admitted for the management of uncontrolled low back pain, ambulatory difficulty due to severe lumbar spine stenosis and T12 compression fracture.  Orthopedics, neurosurgery evaluated the patient and recommended TLSO brace, pain control and outpatient follow-up.  PT recommending acute inpatient rehab.  Difficult placement due to lack of insurance.  Assessment & Plan:  Principal Problem:   Pyogenic arthritis of right shoulder region [facility]) Active Problems:   Spinal stenosis of lumbar region with neurogenic claudication   T12 compression fracture (HCC)   AKI (acute kidney injury) ([place])   Hyponatremia   Acute urinary retention   Type 2 diabetes mellitus without complication, without long-term current use of insulin (HCC)   Leukocytosis   Hypertension associated with diabetes ([place])   Iron deficiency anemia   Hyperlipidemia associated with type 2 diabetes mellitus (HCC)   Mild cognitive impairment   Thrush, oral   Right shoulder pain   Discitis of lumbar region   Chronic low back pain with bilateral sciatica   Severe back pain/lumbar spinal stenosis/T12 compression fracture: Presented with severe low back pain, ambulatory dysfunction.  MRI did not show any cord injury.  Neurosurgery consulted, no surgical intervention recommended, recommended TLSO brace.  IR was also consulted for consideration of T12 kyphoplasty but this was canceled due to fever. PT/OT recommending acute inpatient rehab on discharge.  Fever/leukocytosis: Unclear source.  Developed leukocytosis, tachycardia, fever.  Chest x-ray did not show any pneumonia.  COVID-negative.  MRI of  the spine on 9/18 was concerning for infection/discitis at L4 level.  Aspiration at this level completed on 9/25 by IR.  He started on IV vancomycin, ceftriaxone.  Culture from spinal aspiration negative.  Patient had ongoing pain on the right shoulder, MRI was done which was suggestive of septic arthritis.  Orthopedics aspirated the shoulder on 9/27 with no fluid.  IR aspirated right shoulder on 9/29 without finding of any infection.  ID was also consulted, antibiotics discontinued.  Currently off antibiotics.  Consideration of steroid injection in the shoulder as an outpatient.  Continue pain management.  Microcytic  iron deficiency anemia: Continue iron supplementation.  No evidence of active bleeding.  Likely from anemia of chronic disease.  Transfuse with a unit of PRBC on 9/28.  Hemoglobin stable.  Acute urinary retention/AKI: Felt to be secondary to back pain, mobility.  Started on Flomax.  Renal function is stabilized.  Voiding trial was given on 7/1 but again retained.  Foley in place.  Waiting on follow-up with urology as an outpatient  Constipation: Continue bowel regimen  Vitamin D deficiency: Continue supplementation  Hyponatremia: Unclear reason.  Sodium in the range of 129-130.  Continue monitoring  Diabetes type 2: Recent A1c of 7.4.  Monitor blood sugars  Hypertension: Blood pressure well controlled  Hyperlipidemia: Continue Lipitor  Mild cognitive impairment: Continue donepezil  Disposition: Waiting for acute inpatient rehab. Lack of insurance,likely  Long length of stay    Nutrition Problem: Increased nutrient needs Etiology: post-op healing    DVT prophylaxis:SCDs Start: 05/26/22 2254     Code Status: Full Code  Family Communication: None at bedside  Patient status:Inpatient  Patient is from :Home  Anticipated discharge to:AIR vs SNF  Estimated DC date:Not sure  Consultants: Orthopedics, IR, ID  Procedures: Joint aspiration  Antimicrobials:   Anti-infectives (From admission, onward)    Start     Dose/Rate Route Frequency Ordered Stop   06/03/22 0700  vancomycin (VANCOREADY) IVPB 750 mg/150 mL  Status:  Discontinued        750 mg 150 mL/hr over 60 Minutes Intravenous Every 12 hours 06/02/22 1848 06/06/22 1429   06/02/22 1945  vancomycin (VANCOCIN) IVPB 1000 mg/200 mL premix        1,000 mg 200 mL/hr over 60 Minutes Intravenous  Once 06/02/22 1847 06/02/22 2109   06/02/22 1930  cefTRIAXone (ROCEPHIN) 2 g in sodium chloride 0.9 % 100 mL IVPB  Status:  Discontinued        2 g 200 mL/hr over 30 Minutes Intravenous Every 24 hours 06/02/22 1835 06/06/22 1429   06/02/22 0000  ceFAZolin (ANCEF) IVPB 2g/100 mL premix  Status:  Discontinued        2 g 200 mL/hr over 30 Minutes Intravenous To Radiology 05/30/22 1504 06/02/22 1835       Subjective: Patient seen and examined at the bedside today.  Hemodynamically stable.  Comfortable, lying in bed.  Alert ,oriented.  Answers most of the questions.  Lives in an apartment with friends.  Complains of severe back pain.  Objective: Vitals:   06/10/22 0713 06/10/22 1440 06/10/22 2006 06/11/22 0826  BP: (!) 112/92 124/85 124/84 124/76  Pulse: (!) 102 99 (!) 105 (!) 105  Resp:   18 16  Temp: 99.9 F (37.7 C) (!) 97.5 F (36.4 C) 98.6 F (37 C) 98.3 F (36.8 C)  TempSrc: Oral Oral Oral Oral  SpO2: 96% 97% 96% 91%  Weight:      Height:        Intake/Output Summary (Last 24 hours) at 06/11/2022 0907 Last data filed at 06/11/2022 0524 Gross per 24 hour  Intake --  Output 1800 ml  Net -1800 ml   Filed Weights   06/04/22 0315 06/06/22 0500 06/07/22 0500  Weight: 60 kg 62.8 kg 61.7 kg    Examination:  General exam: Overall comfortable, not in distress,appears deconditioned HEENT: PERRL Respiratory system:  no wheezes or crackles  Cardiovascular system: S1 & S2 heard, RRR.  Gastrointestinal system: Abdomen is nondistended, soft and nontender. Central nervous system: Alert and  oriented Extremities: No edema, no clubbing ,no cyanosis Skin: No rashes, no ulcers,no icterus   GU: foley   Data Reviewed: I have personally reviewed following labs and imaging studies  CBC: Recent Labs  Lab 06/06/22 0246 06/07/22 0302 06/08/22 0358 06/09/22 0149 06/11/22 0805  WBC 8.7 9.6 10.6* 9.8 9.8  HGB 9.2* 8.0* 8.3* 8.5* 8.3*  HCT 27.8* 24.6* 24.9* 25.7* 24.9*  MCV 73.0* 73.2* 71.6* 71.6* 72.2*  PLT 391 404* 467* 526* 635*   Basic Metabolic Panel: Recent Labs  Lab 06/05/22 0338 06/06/22 0246 06/07/22 0302 06/08/22 0358 06/09/22 0149 06/11/22 0805  NA 132* 135 135 132* 130* 129*  K 3.1* 4.1 4.3 3.6 4.2 4.0  CL 92* 96* 98 93* 92* 89*  CO2 29 28 27 27 29 30   GLUCOSE 98 105* 69* 91 118* 126*  BUN 6 <5* 5* <5* 6 7  CREATININE 0.91 0.73 0.69 0.80 0.90 0.85  CALCIUM 9.0 9.2 9.3 9.2 9.4 10.6*  MG 1.6* 1.7 1.4* 2.2  --   --      Recent Results (from the past 240 hour(s))  Culture, blood (Routine X 2) w Reflex to ID Panel  Status: None   Collection Time: 06/01/22 11:27 AM   Specimen: BLOOD RIGHT WRIST  Result Value Ref Range Status   Specimen Description BLOOD RIGHT WRIST  Final   Special Requests   Final    BOTTLES DRAWN AEROBIC AND ANAEROBIC Blood Culture results may not be optimal due to an inadequate volume of blood received in culture bottles   Culture   Final    NO GROWTH 5 DAYS Performed at [facility], [address], [place], [place] [address]    Report Status 06/06/2022 FINAL  Final  Culture, blood (Routine X 2) w Reflex to ID Panel     Status: None   Collection Time: 06/01/22 11:36 AM   Specimen: BLOOD LEFT ARM  Result Value Ref Range Status   Specimen Description BLOOD LEFT ARM  Final   Special Requests   Final    BOTTLES DRAWN AEROBIC AND ANAEROBIC Blood Culture adequate volume   Culture   Final    NO GROWTH 5 DAYS Performed at [facility], [address], [place], [place] [address]    Report Status 06/06/2022 FINAL  Final   SARS Coronavirus 2 by RT PCR (hospital order, performed in [facility] hospital lab) *cepheid single result test* Anterior Nasal Swab     Status: None   Collection Time: 06/01/22  1:19 PM   Specimen: Anterior Nasal Swab  Result Value Ref Range Status   SARS Coronavirus 2 by RT PCR NEGATIVE NEGATIVE Final    Comment: (NOTE) SARS-CoV-2 target nucleic acids are NOT DETECTED.  The SARS-CoV-2 RNA is generally detectable in upper and lower respiratory specimens during the acute phase of infection. The lowest concentration of SARS-CoV-2 viral copies this assay can detect is 250 copies / mL. A negative result does not preclude SARS-CoV-2 infection and should not be used as the sole basis for treatment or other patient management decisions.  A negative result may occur with improper specimen collection / handling, submission of specimen other than nasopharyngeal swab, presence of viral mutation(s) within the areas targeted by this assay, and inadequate number of viral copies (<250 copies / mL). A negative result must be combined with clinical observations, patient history, and epidemiological information.  Fact Sheet for Patients:   RoadLapTop.co.za  Fact Sheet for Healthcare Providers: http://kim-miller.com/  This test is not yet approved or  cleared by the [place] FDA and has been authorized for detection and/or diagnosis of SARS-CoV-2 by FDA under an Emergency Use Authorization (EUA).  This EUA will remain in effect (meaning this test can be used) for the duration of the COVID-19 declaration under Section 564(b)(1) of the Act, 21 U.S.C. section 360bbb-3(b)(1), unless the authorization is terminated or revoked sooner.  Performed at [facility], [address] [place], [place] [address]   Aerobic/Anaerobic Culture w Gram Stain (surgical/deep wound)     Status: None   Collection Time: 06/02/22  2:46 PM   Specimen: Wound; Fine  Needle Aspirate  Result Value Ref Range Status   Specimen Description WOUND  Final   Special Requests FINE NEEDLE ASP OF INTERVERTEBRAL DISC  Final   Gram Stain NO WBC SEEN NO ORGANISMS SEEN   Final   Culture   Final    No growth aerobically or anaerobically. Performed at [facility], [address] [place], [place] [address]    Report Status 06/07/2022 FINAL  Final  MRSA Next Gen by PCR, Nasal     Status: None  Collection Time: 06/04/22  9:54 AM   Specimen: Nasal Mucosa; Nasal Swab  Result Value Ref Range Status   MRSA by PCR Next Gen NOT DETECTED NOT DETECTED Final    Comment: (NOTE) The GeneXpert MRSA Assay (FDA approved for NASAL specimens only), is one component of a comprehensive MRSA colonization surveillance program. It is not intended to diagnose MRSA infection nor to guide or monitor treatment for MRSA infections. Test performance is not FDA approved in patients less than 23 years old. Performed at [facility], [address] [place], [place] [address]   Body fluid culture w Gram Stain     Status: None   Collection Time: 06/06/22  9:04 AM   Specimen: PATH Cytology Misc. fluid; Synovial Fluid  Result Value Ref Range Status   Specimen Description FLUID  Final   Special Requests CYTO MISC  Final   Gram Stain   Final    WBC PRESENT, PREDOMINANTLY PMN NO ORGANISMS SEEN CYTOSPIN SMEAR    Culture   Final    NO GROWTH 3 DAYS Performed at [facility], [address], [place], [place] [address]    Report Status 06/09/2022 FINAL  Final     Radiology Studies: No results found.  Scheduled Meds:  atorvastatin  40 mg Oral Daily   Chlorhexidine Gluconate Cloth  6 each Topical Daily   cholecalciferol  1,000 Units Oral Daily   donepezil  5 mg Oral QHS   feeding supplement (GLUCERNA SHAKE)  237 mL Oral TID BM   ferrous sulfate  325 mg Oral Q breakfast   gabapentin  200 mg Oral TID   insulin aspart  0-9 Units Subcutaneous TID WC   lidocaine  1  patch Transdermal Q24H   magnesium gluconate  500 mg Oral BID   metoprolol succinate  12.5 mg Oral Daily   morphine  15 mg Oral Q12H   polyethylene glycol  17 g Oral BID   potassium chloride  40 mEq Oral Daily   senna-docusate  1 tablet Oral BID   tamsulosin  0.4 mg Oral Daily   Continuous Infusions:   LOS: 16 days   Burnadette Pop, MD [facility] P10/12/2021, 9:07 AM

## 2022-06-11 NOTE — TOC Progression Note (Addendum)
Transition of Care [facility]) - Progression Note    Patient Details  Name: Wayne Stokes MRN: [id number] Date of Birth: 1966/09/04  [facility]) CM/SW Contact  Joanne Chars, LCSW Phone Number: 06/11/2022, 2:11 PM  Clinical Narrative:     Email from [place]/Financial counseling.  Pt has been referred to [facility] for medicaid application.    One bed offer in hub, [facility].  CSW spoke with Brittany/[facility] to confirm.  She is unable to accept under LOG.    Expected Discharge Plan: [place] Barriers to Discharge: Continued Medical Work up  Expected Discharge Plan and Services Expected Discharge Plan: [place] In-house Referral: Development worker, community Discharge Planning Services: CM Consult                                           Social Determinants of Health (SDOH) Interventions    Readmission Risk Interventions     No data to display

## 2022-06-12 LAB — SODIUM, URINE, RANDOM: Sodium, Ur: <10 mmol/L

## 2022-06-12 LAB — BASIC METABOLIC PANEL
Anion gap: 8 (ref 5–15)
BUN: 5 mg/dL — ABNORMAL LOW (ref 6–20)
CO2: 31 mmol/L (ref 22–32)
Calcium: 9.9 mg/dL (ref 8.9–10.3)
Chloride: 88 mmol/L — ABNORMAL LOW (ref 98–111)
Creatinine, Ser: 0.77 mg/dL (ref 0.61–1.24)
GFR, Estimated: >60 mL/min (ref >60)
Glucose, Bld: 119 mg/dL — ABNORMAL HIGH (ref 70–99)
Potassium: 3.8 mmol/L (ref 3.5–5.1)
Sodium: 127 mmol/L — ABNORMAL LOW (ref 135–145)

## 2022-06-12 LAB — OSMOLALITY: Osmolality: 271 mOsm/kg — ABNORMAL LOW (ref 275–295)

## 2022-06-12 LAB — GLUCOSE, CAPILLARY
Glucose-Capillary: 119 mg/dL — ABNORMAL HIGH (ref 70–99)
Glucose-Capillary: 152 mg/dL — ABNORMAL HIGH (ref 70–99)
Glucose-Capillary: 154 mg/dL — ABNORMAL HIGH (ref 70–99)
Glucose-Capillary: 117 mg/dL — ABNORMAL HIGH (ref 70–99)

## 2022-06-12 LAB — CORTISOL: Cortisol, Plasma: 21.4 ug/dL

## 2022-06-12 LAB — URIC ACID: Uric Acid, Serum: 4.6 mg/dL (ref 3.7–8.6)

## 2022-06-12 LAB — TSH: TSH: 2.125 u[IU]/mL (ref 0.350–4.500)

## 2022-06-12 LAB — OSMOLALITY, URINE: Osmolality, Ur: 236 mOsm/kg — ABNORMAL LOW (ref 300–900)

## 2022-06-12 MED ORDER — SODIUM CHLORIDE 0.9 % IV SOLN: INTRAVENOUS | Status: DC

## 2022-06-12 NOTE — Progress Notes (Signed)
Mobility Specialist Progress Note   06/12/22 1640  Mobility  Activity Transferred from chair to bed  Activity Response Tolerated well  $Mobility charge 1 Mobility  Level of Assistance Minimal assist, patient does 75% or more  Assistive Device Stedy   Pt requesting assistance to get from chair to bed d/t discomfort in chair. Required minA to stand fully upright with use of stedy for energy conservation on transfer. Pt left in bed w/o fault, all needs met, and call bell in reach.  Holland Falling Mobility Specialist MS [facility] #:  [phone] Acute Rehab Office:  [phone]

## 2022-06-12 NOTE — Progress Notes (Signed)
PROGRESS NOTE  Wayne Stokes  UKG:[id number] DOB: 22-May-1966 DOA: 05/26/2022 PCP: [name], NP   Brief Narrative:  Patient is a 56 year old male from [place] who peaks [place], with history of diabetes type 2, hypertension, lumbar disc degenerative disease with spinal stenosis/ claudication, T12 vertebral compression fracture, mild cognitive impairment who was admitted for the management of uncontrolled low back pain, ambulatory difficulty due to severe lumbar spine stenosis and T12 compression fracture.  Orthopedics, neurosurgery evaluated the patient and recommended TLSO brace, pain control and outpatient follow-up.  PT recommending acute inpatient rehab.  Difficult placement due to lack of insurance.LLOS  Assessment & Plan:  Principal Problem:   Pyogenic arthritis of right shoulder region [facility]) Active Problems:   Spinal stenosis of lumbar region with neurogenic claudication   T12 compression fracture (HCC)   AKI (acute kidney injury) ([place])   Hyponatremia   Acute urinary retention   Type 2 diabetes mellitus without complication, without long-term current use of insulin (HCC)   Leukocytosis   Hypertension associated with diabetes (HCC)   Iron deficiency anemia   Hyperlipidemia associated with type 2 diabetes mellitus (HCC)   Mild cognitive impairment   Thrush, oral   Right shoulder pain   Discitis of lumbar region   Chronic low back pain with bilateral sciatica   Severe back pain/lumbar spinal stenosis/T12 compression fracture: Presented with severe low back pain, ambulatory dysfunction.  MRI did not show any cord injury.  Neurosurgery consulted, no surgical intervention recommended, recommended TLSO brace.  IR was also consulted for consideration of T12 kyphoplasty but this was canceled due to fever. PT/OT recommending acute inpatient rehab on discharge.  Fever/leukocytosis: Unclear source.  Developed leukocytosis, tachycardia, fever.  Chest x-ray did not show any pneumonia.   COVID-negative.  MRI of the spine on 9/18 was concerning for infection/discitis at L4 level.  Disc Aspiration was completed on 9/25 by IR.  He started on IV vancomycin, ceftriaxone.  Culture from spinal aspiration negative.  Patient had ongoing pain on the right shoulder, MRI was done which was suggestive of septic arthritis.  Orthopedics aspirated the shoulder on 9/27 with no fluid.  IR aspirated right shoulder on 9/29 without finding of any infection.  ID was also consulted, antibiotics discontinued.  Currently off antibiotics.  Consideration of steroid injection in the shoulder as an outpatient.  Continue pain management.  Microcytic  iron deficiency anemia: Continue iron supplementation.  No evidence of active bleeding.  Likely from anemia of chronic disease.  Transfuse with a unit of PRBC on 9/28.  Hemoglobin stable.  Acute urinary retention/AKI: Felt to be secondary to back pain, mobility.  Started on Flomax.  Renal function is stabilized.  Voiding trial was given on 7/1 but again retained.  Foley in place.  We recommend follow-up with urology as an outpatient  Constipation: Continue bowel regimen  Vitamin D deficiency: Continue supplementation  Hyponatremia: Sodium of 127 today.  SIADH panel mostly negative.  Urine sodium less than 10.  Started on normal saline. continue monitoring  Diabetes type 2: Recent A1c of 7.4.  Monitor blood sugars  Hypertension: Blood pressure well controlled  Hyperlipidemia: Continue Lipitor  Mild cognitive impairment: Continue donepezil  Disposition: Waiting for acute inpatient rehab. Lack of insurance,likely  Long length of stay    Nutrition Problem: Increased nutrient needs Etiology: post-op healing    DVT prophylaxis:SCDs Start: 05/26/22 2254     Code Status: Full Code  Family Communication: None at bedside  Patient status:Inpatient  Patient is from :  Home  Anticipated discharge to:AIR vs SNF  Estimated DC date:Not sure   Consultants:  Orthopedics, IR, ID  Procedures: Joint aspiration  Antimicrobials:  Anti-infectives (From admission, onward)    Start     Dose/Rate Route Frequency Ordered Stop   06/03/22 0700  vancomycin (VANCOREADY) IVPB 750 mg/150 mL  Status:  Discontinued        750 mg 150 mL/hr over 60 Minutes Intravenous Every 12 hours 06/02/22 1848 06/06/22 1429   06/02/22 1945  vancomycin (VANCOCIN) IVPB 1000 mg/200 mL premix        1,000 mg 200 mL/hr over 60 Minutes Intravenous  Once 06/02/22 1847 06/02/22 2109   06/02/22 1930  cefTRIAXone (ROCEPHIN) 2 g in sodium chloride 0.9 % 100 mL IVPB  Status:  Discontinued        2 g 200 mL/hr over 30 Minutes Intravenous Every 24 hours 06/02/22 1835 06/06/22 1429   06/02/22 0000  ceFAZolin (ANCEF) IVPB 2g/100 mL premix  Status:  Discontinued        2 g 200 mL/hr over 30 Minutes Intravenous To Radiology 05/30/22 1504 06/02/22 1835       Subjective: Patient seen and examined the bedside today.  Hemodynamically stable.  Alert and oriented, comfortable but complains of back pain.  We tried to communicate with the help of with interpreter but [place] interpretor not available Objective: Vitals:   06/11/22 1642 06/11/22 2017 06/11/22 2030 06/12/22 0714  BP: 116/78 116/71 (!) 126/94 114/76  Pulse: (!) 108 (!) 114 (!) 108 100  Resp: 17 16    Temp: 99.1 F (37.3 C) 99.2 F (37.3 C) 98.8 F (37.1 C) (!) 97.5 F (36.4 C)  TempSrc: Oral  Oral Oral  SpO2: 93% 92% 94% 93%  Weight:      Height:        Intake/Output Summary (Last 24 hours) at 06/12/2022 1121 Last data filed at 06/12/2022 0900 Gross per 24 hour  Intake 2180 ml  Output 700 ml  Net 1480 ml   Filed Weights   06/04/22 0315 06/06/22 0500 06/07/22 0500  Weight: 60 kg 62.8 kg 61.7 kg    Examination:  General exam: Overall comfortable, not in distress HEENT: PERRL Respiratory system:  no wheezes or crackles  Cardiovascular system: S1 & S2 heard, RRR.  Gastrointestinal system: Abdomen is  nondistended, soft and nontender. Central nervous system: Alert and oriented, obeys commands Extremities: No edema, no clubbing ,no cyanosis Skin: No rashes, no ulcers,no icterus   GU : Foley   Data Reviewed: I have personally reviewed following labs and imaging studies  CBC: Recent Labs  Lab 06/06/22 0246 06/07/22 0302 06/08/22 0358 06/09/22 0149 06/11/22 0805  WBC 8.7 9.6 10.6* 9.8 9.8  HGB 9.2* 8.0* 8.3* 8.5* 8.3*  HCT 27.8* 24.6* 24.9* 25.7* 24.9*  MCV 73.0* 73.2* 71.6* 71.6* 72.2*  PLT 391 404* 467* 526* 635*   Basic Metabolic Panel: Recent Labs  Lab 06/06/22 0246 06/07/22 0302 06/08/22 0358 06/09/22 0149 06/11/22 0805 06/12/22 0254  NA 135 135 132* 130* 129* 127*  K 4.1 4.3 3.6 4.2 4.0 3.8  CL 96* 98 93* 92* 89* 88*  CO2 28 27 27 29 30 31   GLUCOSE 105* 69* 91 118* 126* 119*  BUN <5* 5* <5* 6 7 5*  CREATININE 0.73 0.69 0.80 0.90 0.85 0.77  CALCIUM 9.2 9.3 9.2 9.4 10.6* 9.9  MG 1.7 1.4* 2.2  --   --   --      Recent Results (from  the past 240 hour(s))  Aerobic/Anaerobic Culture w Gram Stain (surgical/deep wound)     Status: None   Collection Time: 06/02/22  2:46 PM   Specimen: Wound; Fine Needle Aspirate  Result Value Ref Range Status   Specimen Description WOUND  Final   Special Requests FINE NEEDLE ASP OF INTERVERTEBRAL DISC  Final   Gram Stain NO WBC SEEN NO ORGANISMS SEEN   Final   Culture   Final    No growth aerobically or anaerobically. Performed at [facility], [address] [place], [place] [address]    Report Status 06/07/2022 FINAL  Final  MRSA Next Gen by PCR, Nasal     Status: None   Collection Time: 06/04/22  9:54 AM   Specimen: Nasal Mucosa; Nasal Swab  Result Value Ref Range Status   MRSA by PCR Next Gen NOT DETECTED NOT DETECTED Final    Comment: (NOTE) The GeneXpert MRSA Assay (FDA approved for NASAL specimens only), is one component of a comprehensive MRSA colonization surveillance program. It is not intended to  diagnose MRSA infection nor to guide or monitor treatment for MRSA infections. Test performance is not FDA approved in patients less than 44 years old. Performed at [facility], [address], [place], [place] [address]   Body fluid culture w Gram Stain     Status: None   Collection Time: 06/06/22  9:04 AM   Specimen: PATH Cytology Misc. fluid; Synovial Fluid  Result Value Ref Range Status   Specimen Description FLUID  Final   Special Requests CYTO MISC  Final   Gram Stain   Final    WBC PRESENT, PREDOMINANTLY PMN NO ORGANISMS SEEN CYTOSPIN SMEAR    Culture   Final    NO GROWTH 3 DAYS Performed at [facility], [address], [place], [place] [address]    Report Status 06/09/2022 FINAL  Final     Radiology Studies: No results found.  Scheduled Meds:  atorvastatin  40 mg Oral Daily   Chlorhexidine Gluconate Cloth  6 each Topical Daily   cholecalciferol  1,000 Units Oral Daily   donepezil  5 mg Oral QHS   feeding supplement (GLUCERNA SHAKE)  237 mL Oral TID BM   ferrous sulfate  325 mg Oral Q breakfast   gabapentin  200 mg Oral TID   insulin aspart  0-9 Units Subcutaneous TID WC   lidocaine  1 patch Transdermal Q24H   magnesium gluconate  500 mg Oral BID   metoprolol succinate  12.5 mg Oral Daily   morphine  15 mg Oral Q12H   polyethylene glycol  17 g Oral BID   potassium chloride  40 mEq Oral Daily   senna-docusate  1 tablet Oral BID   tamsulosin  0.4 mg Oral Daily   Continuous Infusions:  sodium chloride       LOS: 17 days   Burnadette Pop, MD [facility] P10/01/2022, 11:21 AM

## 2022-06-12 NOTE — TOC Progression Note (Addendum)
Transition of Care [facility]) - Progression Note    Patient Details  Name: Wayne Stokes MRN: [id number] Date of Birth: 1966-04-18  Transition of [facility]) CM/SW Contact  Joanne Chars, LCSW Phone Number: 06/12/2022, 1:00 PM  Clinical Narrative:   no bed offers, SNF search expanded.   PASSR received: [id number]  Expected Discharge Plan: [place] Barriers to Discharge: Continued Medical Work up  Expected Discharge Plan and Services Expected Discharge Plan: [place] In-house Referral: Development worker, community Discharge Planning Services: CM Consult                                           Social Determinants of Health (SDOH) Interventions    Readmission Risk Interventions     No data to display

## 2022-06-12 NOTE — Progress Notes (Signed)
Occupational Therapy Treatment Patient Details Name: Wayne Stokes MRN: [id number] DOB: [birth year] Today's Date: 06/12/2022   History of present illness 56 y.o. male presented 05/26/22 with c/o severe and progressively worsening low back pain that radiates into his left buttock and down his LLE. Imaging +acute T12 compression fracture and  L4/5 there is multifactorial severe spinal stenosis and severe bilateral foraminal stenosis. Neurosurgery consult with no acute surgical intervention needed. IR consulted for possible kyphoplasty but has been delayed due to fever/SIRS and possible septic arthritis bil shoulder. 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   OT comments  Pt progressing towards established OT goals. Pt requiring Min A for bed mobility and Max A to don brace. Pt performing sit<>stand with Min A first with stedy and then with RW. Pt marching in place by recliner and reporting muscle "cramps/twisting" at R thigh. Setting up patient for self feeding of lunch. Continue to recommend dc to AIR for intensive OT and will continue to follow acutely as admitted. Updated OT goals.    Recommendations for follow up therapy are one component of a multi-disciplinary discharge planning process, led by the attending physician.  Recommendations may be updated based on patient status, additional functional criteria and insurance authorization.    Follow Up Recommendations  Acute inpatient rehab (3hours/day)    Assistance Recommended at Discharge Frequent or constant Supervision/Assistance  Patient can return home with the following  A lot of help with walking and/or transfers;A lot of help with bathing/dressing/bathroom   Equipment Recommendations  BSC/3in1    Recommendations for Other Services      Precautions / Restrictions Precautions Precautions: Fall;Back Precaution Comments: reviewed  BLT back rule for pt comfort Required Braces or Orthoses: Spinal Brace Spinal Brace: Thoracolumbosacral orthotic;Applied in sitting position       Mobility Bed Mobility Overal bed mobility: Needs Assistance Bed Mobility: Rolling, Sidelying to Sit Rolling: Min assist Sidelying to sit: Min assist       General bed mobility comments: Min A for managing shoulder during roll and then elevating trunk into sitting.    Transfers Overall transfer level: Needs assistance Equipment used: Rolling walker (2 wheels), Ambulation equipment used Transfers: Sit to/from Stand Sit to Stand: Min assist           General transfer comment: Min A for gaining balance in standing. First use of stedy from EOB and then use of RW from recliner. Transfer via Lift Equipment: Stedy   Balance Overall balance assessment: Needs assistance, History of Falls Sitting-balance support: Feet supported, Bilateral upper extremity supported Sitting balance-Leahy Scale: Poor     Standing balance support: Bilateral upper extremity supported, During functional activity Standing balance-Leahy Scale: Poor                             ADL either performed or assessed with clinical judgement   ADL Overall ADL's : Needs assistance/impaired Eating/Feeding: Set up;Sitting Eating/Feeding Details (indicate cue type and reason): setting pt up for lunch while seated in recliner. Pt able to reach forward and use spoon to each mash potatoes.             Upper Body Dressing : Maximal assistance Upper Body Dressing Details (indicate cue type and reason): Max A to don brace     Toilet Transfer: Minimal assistance;Maximal assistance Toilet Transfer Details (indicate cue type and reason): sit<>stand with  stedy and then use of stedy to pivot to recliner         Functional mobility during ADLs: Minimal assistance (sit<>stand with stedy and then walker) General ADL Comments: Pt demonstrating increased strength  to perform sit<>stand with Min A. However, once seated in reclienr, pt repeating that his muscle (at his right thigh) is "twisting". Interpreter reporting he thinks pt means cramping.    Extremity/Trunk Assessment Upper Extremity Assessment Upper Extremity Assessment: LUE deficits/detail RUE Deficits / Details: right shoulder pain LUE Deficits / Details: increased shoulder pain   Lower Extremity Assessment Lower Extremity Assessment: Defer to PT evaluation        Vision       Perception     Praxis      Cognition Arousal/Alertness: Awake/alert Behavior During Therapy: WFL for tasks assessed/performed Overall Cognitive Status: Impaired/Different from baseline Area of Impairment: Problem solving                             Problem Solving: Difficulty sequencing, Requires verbal cues General Comments: Pt requiring increased cues throughout. Easily distracted by pain. Perseverating on certain topics and not always engaging with therapist or answering questions        Exercises      Shoulder Instructions       General Comments In person interpreter present throughout.    Pertinent Vitals/ Pain       Pain Assessment Pain Assessment: 0-10 Faces Pain Scale: Hurts whole lot Pain Location: R thigh Pain Descriptors / Indicators: Cramping, Discomfort, Grimacing, Guarding, Moaning Pain Intervention(s): Monitored during session, Limited activity within patient's tolerance, Repositioned  Home Living                                          Prior Functioning/Environment              Frequency  Min 2X/week        Progress Toward Goals  OT Goals(current goals can now be found in the care plan section)  Progress towards OT goals: Progressing toward goals  Acute Rehab OT Goals OT Goal Formulation: With patient Time For Goal Achievement: 06/26/22 Potential to Achieve Goals: Good ADL Goals Pt Will Perform Grooming: with modified  independence;standing Pt Will Perform Lower Body Dressing: with modified independence;sit to/from stand Pt Will Transfer to Toilet: with modified independence;ambulating Additional ADL Goal #1: Pt will demonstrate independence with 3/3 back precautions. Additional ADL Goal #2: Pt will demonstrate independence with wear schedule and donning/doffing back brace.  Plan Discharge plan remains appropriate    Co-evaluation                 AM-PAC OT "6 Clicks" Daily Activity     Outcome Measure   Help from another person eating meals?: A Little Help from another person taking care of personal grooming?: A Little Help from another person toileting, which includes using toliet, bedpan, or urinal?: A Lot Help from another person bathing (including washing, rinsing, drying)?: A Lot Help from another person to put on and taking off regular upper body clothing?: A Lot Help from another person to put on and taking off regular lower body clothing?: A Lot 6 Click Score: 14    End of Session Equipment Utilized During Treatment: Back brace  OT Visit Diagnosis: Other abnormalities of gait and mobility (R26.89);Muscle weakness (  generalized) (M62.81);Pain Pain - Right/Left: Right Pain - part of body: Shoulder   Activity Tolerance Patient limited by pain   Patient Left in chair;with call bell/phone within reach;with chair alarm set   Nurse Communication Mobility status        Time: 0737-1062 OT Time Calculation (min): 35 min  Charges: OT General Charges $OT Visit: 1 Visit OT Treatments $Self Care/Home Management : 23-37 mins  Annalysia Willenbring MSOT, OTR/L Acute Rehab Office: [phone]  Theodoro Grist Tyresha Fede 06/12/2022, 4:38 PM

## 2022-06-13 ENCOUNTER — Ambulatory Visit: Payer: No Typology Code available for payment source | Admitting: Orthopaedic Surgery

## 2022-06-13 ENCOUNTER — Encounter (HOSPITAL_COMMUNITY): Payer: Self-pay | Admitting: Internal Medicine

## 2022-06-13 LAB — CBC
HCT: 22.5 % — ABNORMAL LOW (ref 39.0–52.0)
Hemoglobin: 7.3 g/dL — ABNORMAL LOW (ref 13.0–17.0)
MCH: 23.3 pg — ABNORMAL LOW (ref 26.0–34.0)
MCHC: 32.4 g/dL (ref 30.0–36.0)
MCV: 71.9 fL — ABNORMAL LOW (ref 80.0–100.0)
Platelets: 742 10*3/uL — ABNORMAL HIGH (ref 150–400)
RBC: 3.13 MIL/uL — ABNORMAL LOW (ref 4.22–5.81)
RDW: 18.7 % — ABNORMAL HIGH (ref 11.5–15.5)
WBC: 8.9 10*3/uL (ref 4.0–10.5)
nRBC: 0 % (ref 0.0–0.2)

## 2022-06-13 LAB — BASIC METABOLIC PANEL
Anion gap: 9 (ref 5–15)
BUN: <5 mg/dL — ABNORMAL LOW (ref 6–20)
CO2: 27 mmol/L (ref 22–32)
Calcium: 9.7 mg/dL (ref 8.9–10.3)
Chloride: 96 mmol/L — ABNORMAL LOW (ref 98–111)
Creatinine, Ser: 0.76 mg/dL (ref 0.61–1.24)
GFR, Estimated: >60 mL/min (ref >60)
Glucose, Bld: 103 mg/dL — ABNORMAL HIGH (ref 70–99)
Potassium: 3.6 mmol/L (ref 3.5–5.1)
Sodium: 132 mmol/L — ABNORMAL LOW (ref 135–145)

## 2022-06-13 LAB — GLUCOSE, CAPILLARY
Glucose-Capillary: 101 mg/dL — ABNORMAL HIGH (ref 70–99)
Glucose-Capillary: 191 mg/dL — ABNORMAL HIGH (ref 70–99)
Glucose-Capillary: 147 mg/dL — ABNORMAL HIGH (ref 70–99)
Glucose-Capillary: 127 mg/dL — ABNORMAL HIGH (ref 70–99)

## 2022-06-13 MED ORDER — POTASSIUM CHLORIDE 20 MEQ PO PACK
40.0000 meq | PACK | Freq: Once | ORAL | Status: AC
  Administered 2022-06-13: 40 meq via ORAL
  Filled 2022-06-13: qty 2

## 2022-06-13 NOTE — Plan of Care (Signed)
  Problem: Education: Goal: Knowledge of General Education information will improve Description: Including pain rating scale, medication(s)/side effects and non-pharmacologic comfort measures Outcome: Progressing   Problem: Health Behavior/Discharge Planning: Goal: Ability to manage health-related needs will improve Outcome: Progressing   Problem: Clinical Measurements: Goal: Ability to maintain clinical measurements within normal limits will improve Outcome: Progressing Goal: Diagnostic test results will improve Outcome: Progressing   Problem: Activity: Goal: Risk for activity intolerance will decrease Outcome: Progressing   Problem: Coping: Goal: Level of anxiety will decrease Outcome: Progressing   Problem: Elimination: Goal: Will not experience complications related to bowel motility Outcome: Progressing Goal: Will not experience complications related to urinary retention Outcome: Progressing   Problem: Safety: Goal: Ability to remain free from injury will improve Outcome: Progressing   Problem: Skin Integrity: Goal: Risk for impaired skin integrity will decrease Outcome: Progressing   Problem: Education: Goal: Ability to describe self-care measures that may prevent or decrease complications (Diabetes Survival Skills Education) will improve Outcome: Progressing Goal: Individualized Educational Video(s) Outcome: Progressing   Problem: Coping: Goal: Ability to adjust to condition or change in health will improve Outcome: Progressing   Problem: Fluid Volume: Goal: Ability to maintain a balanced intake and output will improve Outcome: Progressing   Problem: Health Behavior/Discharge Planning: Goal: Ability to identify and utilize available resources and services will improve Outcome: Progressing Goal: Ability to manage health-related needs will improve Outcome: Progressing   Problem: Metabolic: Goal: Ability to maintain appropriate glucose levels will  improve Outcome: Progressing   Problem: Nutritional: Goal: Maintenance of adequate nutrition will improve Outcome: Progressing Goal: Progress toward achieving an optimal weight will improve Outcome: Progressing   Problem: Skin Integrity: Goal: Risk for impaired skin integrity will decrease Outcome: Progressing   Problem: Tissue Perfusion: Goal: Adequacy of tissue perfusion will improve Outcome: Progressing

## 2022-06-13 NOTE — Progress Notes (Signed)
Mobility Specialist Progress Note   06/13/22 1310  Mobility  Activity Transferred from chair to bed  Activity Response Tolerated fair  Distance Ambulated (ft) 2 ft  $Mobility charge 1 Mobility  Level of Assistance +2 (takes two people) Education officer, museum)  Games developer wheel walker   Pt limited by pain during transfer today. +2 MinA to assist in pericare before stand pivot turn to EOB. Pt prematurely sitting down and disregarding cues d/t pain. Safely laid back supine w/ needs met and RN in room.   Holland Falling Mobility Specialist MS [facility] #:  [phone] Acute Rehab Office:  [phone]

## 2022-06-13 NOTE — Progress Notes (Signed)
Chief Complaint: Patient was seen today for back pain  Supervising Physician: Pedro Earls  Patient Status: Wayne Stokes - In-pt  Subjective: Pt was on schedule for symptomatic T12 kyphoplasty 06/02/22 however prior to procedure was found to have increased fevers up to 102 with rising WBC.  Due to concern for infectious process, T12 KP was deferred and patient underwent lower lumbar disc aspiration.  Sample from aspiration returned negative for organism.  Patient has been treated for his infectious process and is now stable.  He continues to experience debilitating back pain requiring tramadol, oxycodone, and morphine for management. IR re-consulted for reevaluation of vertebroplasty/kyphoplasty.    Afebrile. WBC 8.9  Objective: Physical Exam: BP (!) 144/92 (BP Location: Left Arm)   Pulse 100   Temp 97.7 F (36.5 C) (Oral)   Resp 17   Ht 5' (1.524 m)   Wt 136 lb 0.4 oz (61.7 kg)   SpO2 91%   BMI 26.57 kg/m  Lungs: CTA without w/r/r Heart: Regular Back:  Tenderness in the mid back.  Worse with movement.     Current Facility-Administered Medications:    atorvastatin (LIPITOR) tablet 40 mg, 40 mg, Oral, Daily, Posey Pronto, Vishal R, MD, 40 mg at 06/13/22 1025   bisacodyl (DULCOLAX) suppository 10 mg, 10 mg, Rectal, Daily PRN, Sheikh, Omair Latif, DO   Chlorhexidine Gluconate Cloth 2 % PADS 6 each, 6 each, Topical, Daily, Danford, Suann Larry, MD, 6 each at 06/13/22 1226   cholecalciferol (VITAMIN D3) 25 MCG (1000 UNIT) tablet 1,000 Units, 1,000 Units, Oral, Daily, Lavina Hamman, MD, 1,000 Units at 06/13/22 1024   donepezil (ARICEPT) tablet 5 mg, 5 mg, Oral, QHS, Patel, Vishal R, MD, 5 mg at 06/12/22 2142   feeding supplement (GLUCERNA SHAKE) (GLUCERNA SHAKE) liquid 237 mL, 237 mL, Oral, TID BM, Cherene Altes, MD, 237 mL at 06/12/22 2142   ferrous sulfate tablet 325 mg, 325 mg, Oral, Q breakfast, Danford, Suann Larry, MD, 325 mg at 06/13/22 1024   gabapentin  (NEURONTIN) capsule 200 mg, 200 mg, Oral, TID, Cherene Altes, MD, 200 mg at 06/13/22 1633   insulin aspart (novoLOG) injection 0-9 Units, 0-9 Units, Subcutaneous, TID WC, Sheikh, Omair Latif, DO, 1 Units at 06/13/22 1636   lidocaine (LIDODERM) 5 % 1 patch, 1 patch, Transdermal, Q24H, Sheikh, Omair [place], DO, 1 patch at 06/13/22 1442   magnesium gluconate (MAGONATE) tablet 500 mg, 500 mg, Oral, BID, Joette Catching T, MD, 500 mg at 06/13/22 1024   methocarbamol (ROBAXIN) tablet 500 mg, 500 mg, Oral, Q8H PRN, Joette Catching T, MD, 500 mg at 06/13/22 1101   metoprolol succinate (TOPROL-XL) 24 hr tablet 12.5 mg, 12.5 mg, Oral, Daily, Joette Catching T, MD, 12.5 mg at 06/13/22 1024   morphine (MS CONTIN) 12 hr tablet 15 mg, 15 mg, Oral, Q12H, Joette Catching T, MD, 15 mg at 06/13/22 1022   Muscle Rub CREA 1 Application, 1 Application, Topical, PRN, Raiford Noble Latif, DO, 1 Application at [id number]   ondansetron (ZOFRAN) tablet 4 mg, 4 mg, Oral, Q6H PRN **OR** [DISCONTINUED] ondansetron (ZOFRAN) injection 4 mg, 4 mg, Intravenous, Q6H PRN, Zada Finders R, MD, 4 mg at 06/01/22 2109   Oral care mouth rinse, 15 mL, Mouth Rinse, PRN, Cherene Altes, MD   oxyCODONE (Oxy IR/ROXICODONE) immediate release tablet 10 mg, 10 mg, Oral, Q4H PRN, Cherene Altes, MD, 10 mg at 06/13/22 1633   polyethylene glycol (MIRALAX / GLYCOLAX) packet 17 g, 17 g,  Oral, BID, Raiford Noble [place], [place], 17 g at 06/12/22 2143   senna-docusate (Senokot-S) tablet 1 tablet, 1 tablet, Oral, BID, Raiford Noble [place], [place], 1 tablet at 06/13/22 1024   simethicone (MYLICON) chewable tablet 80 mg, 80 mg, Oral, QID PRN, Opyd, Ilene Qua, MD, 80 mg at 06/02/22 0451   sodium chloride (PF) 0.9 % injection 10 mL, 10 mL, Other, PRN, Mignon Pine, DO, 10 mL at 06/11/22 1019   tamsulosin (FLOMAX) capsule 0.4 mg, 0.4 mg, Oral, Daily, Little Ishikawa, MD, 0.4 mg at 06/13/22 1024   traMADol (ULTRAM) tablet 50 mg, 50 mg, Oral,  Q6H PRN, Cherene Altes, MD, 50 mg at 06/13/22 1226  Labs: CBC Recent Labs    06/11/22 0805 06/13/22 0238  WBC 9.8 8.9  HGB 8.3* 7.3*  HCT 24.9* 22.5*  PLT 635* 742*   BMET Recent Labs    06/12/22 0254 06/13/22 0238  NA 127* 132*  K 3.8 3.6  CL 88* 96*  CO2 31 27  GLUCOSE 119* 103*  BUN 5* <5*  CREATININE 0.77 0.76  CALCIUM 9.9 9.7   LFT No results for input(s): "PROT", "ALBUMIN", "AST", "ALT", "ALKPHOS", "BILITOT", "BILIDIR", "IBILI", "LIPASE" in the last 72 hours.  PT/INR No results for input(s): "LABPROT", "INR" in the last 72 hours.    Studies/Results: No results found.  Assessment/Plan: T12 compression fracture Patient's fever and WBC have resolved.  He is now stable from infection standpoint.  IR re-consulted for consideration of T12 kyphoplasty.  PA to bedside this afternoon.  Waymon Amato, patient's translator, available by phone today to assist with consult.  Patient states he continues to have severe back pain.  RN confirms patient has increased difficulty with movement especially when transitioning from bed to chair.  Patient remains interested in pursuing T12 KP.  Will make NPO p MN Monday pending confirmation that no additional approval is needed prior to proceeding as previously planned.   Risks and benefits were discussed with the patient including, but not limited to education regarding the natural healing process of compression fractures without intervention, bleeding, infection, cement migration which may cause spinal cord damage, paralysis, pulmonary embolism or even death.  This interventional procedure involves the use of X-rays and because of the nature of the planned procedure, it is possible that we will have prolonged use of X-ray fluoroscopy.  Potential radiation risks to you include (but are not limited to) the following: - A slightly elevated risk for cancer  several years later in life. This risk is typically less than 0.5% percent. This  risk is low in comparison to the normal incidence of human cancer, which is 33% for women and 50% for men according to the [place]. - Radiation induced injury can include skin redness, resembling a rash, tissue breakdown / ulcers and hair loss (which can be temporary or permanent).   The likelihood of either of these occurring depends on the difficulty of the procedure and whether you are sensitive to radiation due to previous procedures, disease, or genetic conditions.   IF your procedure requires a prolonged use of radiation, you will be notified and given written instructions for further action.  It is your responsibility to monitor the irradiated area for the 2 weeks following the procedure and to notify your physician if you are concerned that you have suffered a radiation induced injury.    All of the patient's questions were answered, patient is agreeable to proceed.  Consent signed and in chart.  LOS: 18 days   I spent a total of 20 minutes in face to face in clinical consultation, greater than 50% of which was counseling/coordinating care for lumbar disc aspiration  Docia Barrier PA-C 06/13/2022 5:23 PM

## 2022-06-13 NOTE — Progress Notes (Signed)
Physical Therapy Treatment Patient Details Name: Wayne Stokes MRN: [id number] DOB: 07/30/1966 Today's Date: 06/13/2022   History of Present Illness 56 y.o. male presented 05/26/22 with c/o severe and progressively worsening low back pain that radiates into his left buttock and down his LLE. Imaging +acute T12 compression fracture and  L4/5 there is multifactorial severe spinal stenosis and severe bilateral foraminal stenosis. Neurosurgery consult with no acute surgical intervention needed. IR consulted for possible kyphoplasty but has been delayed due to fever/SIRS and possible septic arthritis bil shoulder. 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    PT Comments    Patient progressing slowly towards PT goals. Session focused on initiation of gait training and standing without stedy frame. Requires Mod A of 2 for standing multiple times from EOB with flexed posture. Able to initiate gait ~4' with RW and Mod A of 2 for safety due to BLE weakness. Pt continues to have lots of pain everywhere with cramping in RLE. Recommend nursing use stedy for tx back to bed. Continues to be appropriate for AIR. Will follow.    Recommendations for follow up therapy are one component of a multi-disciplinary discharge planning process, led by the attending physician.  Recommendations may be updated based on patient status, additional functional criteria and insurance authorization.  Follow Up Recommendations  Acute inpatient rehab (3hours/day) Can patient physically be transported by private vehicle: No   Assistance Recommended at Discharge Frequent or constant Supervision/Assistance  Patient can return home with the following A lot of help with bathing/dressing/bathroom;Assistance with cooking/housework;Assist for transportation;Help with stairs or ramp for entrance;Two people to help with walking and/or  transfers   Equipment Recommendations  BSC/3in1;Rolling walker (2 wheels)    Recommendations for Other Services       Precautions / Restrictions Precautions Precautions: Fall;Back Precaution Booklet Issued: No Required Braces or Orthoses: Spinal Brace Spinal Brace: Thoracolumbosacral orthotic;Applied in sitting position Restrictions Weight Bearing Restrictions: No     Mobility  Bed Mobility Overal bed mobility: Needs Assistance Bed Mobility: Rolling, Sidelying to Sit Rolling: Min assist Sidelying to sit: Min assist, HOB elevated       General bed mobility comments: Min A for trunk to get to EOB and to scoot bottom. Posterior bias.    Transfers Overall transfer level: Needs assistance Equipment used: Rolling walker (2 wheels) Transfers: Sit to/from Stand, Bed to chair/wheelchair/BSC Sit to Stand: Mod assist, +2 physical assistance   Step pivot transfers: Mod assist, +2 safety/equipment       General transfer comment: Mod A of 2 to power to standing with cues for hand placement./technique as pt prefers pulling up on RW, stood from EOB x4. Transferred to chair post ambulation.    Ambulation/Gait Ambulation/Gait assistance: Mod assist, +2 safety/equipment Gait Distance (Feet): 4 Feet Assistive device: Rolling walker (2 wheels) Gait Pattern/deviations: Trunk flexed, Knee flexed in stance - left, Knee flexed in stance - right Gait velocity: decr     General Gait Details: Able to take a few steps to get to chair with flexed psoture at knees/hips and trunk and mod A for balance and RW management.   Stairs             Wheelchair Mobility    Modified Rankin (Stroke Patients Only)       Balance Overall balance assessment: Needs assistance, History of Falls Sitting-balance support: Feet supported, Bilateral upper extremity supported Sitting balance-Leahy Scale: Poor Sitting  balance - Comments: min guard for safety, total A to donn brace   Standing  balance support: Bilateral upper extremity supported, During functional activity Standing balance-Leahy Scale: Poor Standing balance comment: reliant on external support for balance and UE support. Stood for [employer].                            Cognition Arousal/Alertness: Awake/alert Behavior During Therapy: WFL for tasks assessed/performed Overall Cognitive Status: Impaired/Different from baseline Area of Impairment: Problem solving                             Problem Solving: Difficulty sequencing, Requires verbal cues General Comments: Pt requiring increased cues throughout. Easily distracted by pain. Perseverating on certain topics and not always engaging with therapist or answering questions        Exercises      General Comments General comments (skin integrity, edema, etc.): In person interpreter present throughout. [name].      Pertinent Vitals/Pain Pain Assessment Pain Assessment: Faces Faces Pain Scale: Hurts whole lot Pain Location: everywhere Pain Descriptors / Indicators: Cramping, Discomfort, Grimacing, Guarding, Moaning Pain Intervention(s): Monitored during session, Patient requesting pain meds-RN notified, Limited activity within patient's tolerance, Repositioned    Home Living                          Prior Function            PT Goals (current goals can now be found in the care plan section) Progress towards PT goals: Progressing toward goals    Frequency    Min 4X/week      PT Plan Current plan remains appropriate    Co-evaluation              AM-PAC PT "6 Clicks" Mobility   Outcome Measure  Help needed turning from your back to your side while in a flat bed without using bedrails?: A Little Help needed moving from lying on your back to sitting on the side of a flat bed without using bedrails?: A Lot Help needed moving to and from a bed to a chair (including a wheelchair)?: A Lot Help needed  standing up from a chair using your arms (e.g., wheelchair or bedside chair)?: A Lot Help needed to walk in hospital room?: Total Help needed climbing 3-5 steps with a railing? : Total 6 Click Score: 11    End of Session Equipment Utilized During Treatment: Back brace;Gait belt Activity Tolerance: Patient limited by pain Patient left: in chair;with call bell/phone within reach;with chair alarm set;Other (comment) (with interpreter) Nurse Communication: Mobility status;Need for lift equipment (stedy) PT Visit Diagnosis: Other abnormalities of gait and mobility (R26.89);Muscle weakness (generalized) (M62.81)     Time: 8182-9937 PT Time Calculation (min) (ACUTE ONLY): 24 min  Charges:  $Therapeutic Activity: 23-37 mins                     Marisa Severin, PT, DPT Acute Rehabilitation Services Secure chat preferred Office [phone]      Marguarite Arbour A [facility] [place] 06/13/2022, 12:42 PM

## 2022-06-13 NOTE — Progress Notes (Signed)
PROGRESS NOTE  Wayne Stokes  HLK:[id number] DOB: 04-28-1966 DOA: 05/26/2022 PCP: Wayne Crook I, NP   Brief Narrative:  Patient is a 56 year old male from [place] who [place], with history of diabetes type 2, hypertension, lumbar disc degenerative disease with spinal stenosis/ claudication, T12 vertebral compression fracture, mild cognitive impairment who was admitted for the management of uncontrolled low back pain, ambulatory difficulty due to severe lumbar spine stenosis and T12 compression fracture.  Orthopedics, neurosurgery evaluated the patient and recommended TLSO brace, pain control and outpatient follow-up.  PT recommending acute inpatient rehab.  Difficult placement due to lack of insurance.LLOS. Requesting IR for consideration of T12 kyphoplasty  Assessment & Plan:  Principal Problem:   Pyogenic arthritis of right shoulder region [facility]) Active Problems:   Spinal stenosis of lumbar region with neurogenic claudication   T12 compression fracture (HCC)   AKI (acute kidney injury) (HCC)   Hyponatremia   Acute urinary retention   Type 2 diabetes mellitus without complication, without long-term current use of insulin (HCC)   Leukocytosis   Hypertension associated with diabetes (HCC)   Iron deficiency anemia   Hyperlipidemia associated with type 2 diabetes mellitus (HCC)   Mild cognitive impairment   Thrush, oral   Right shoulder pain   Discitis of lumbar region   Chronic low back pain with bilateral sciatica   Severe back pain/lumbar spinal stenosis/T12 compression fracture: Presented with severe low back pain, ambulatory dysfunction.  MRI did not show any cord injury.  Neurosurgery consulted, no surgical intervention recommended, recommended TLSO brace.  IR was also consulted for consideration of T12 kyphoplasty but this was canceled due to fever.  Requested for kyphoplasty again because he does not have any signs of sepsis.   PT/OT recommending acute inpatient rehab on  discharge.  Fever/leukocytosis: Unclear source. Now resolved. Developed leukocytosis, tachycardia, fever.  Chest x-ray did not show any pneumonia.  COVID-negative.  MRI of the spine on 9/18 was concerning for infection/discitis at L4 level.  Disc Aspiration was completed on 9/25 by IR.  He started on IV vancomycin, ceftriaxone.  Culture from spinal aspiration negative.  Patient had ongoing pain on the right shoulder, MRI was done which was suggestive of septic arthritis.  Orthopedics aspirated the shoulder on 9/27 with no fluid.  IR aspirated right shoulder on 9/29 without finding of any infection.  ID was also consulted, antibiotics discontinued.  Currently off antibiotics.  Consideration of steroid injection in the shoulder as an outpatient.  Continue pain management.  Microcytic  iron deficiency anemia: Continue iron supplementation.  No evidence of active bleeding.  Likely from anemia of chronic disease.  Transfuse with a unit of PRBC on 9/28.  Continue monitoring  Acute urinary retention/AKI: Felt to be secondary to back pain, mobility.  Started on Flomax.  Renal function is stabilized.  Voiding trial was given on 7/1 but again retained.  Foley in place.  We recommend follow-up with urology as an outpatient  Constipation: Continue bowel regimen  Vitamin D deficiency: Continue supplementation  Hyponatremia SIADH panel mostly negative.  Urine sodium less than 10.  Started on normal saline with improvement  Diabetes type 2: Recent A1c of 7.4.  Monitor blood sugars  Hypertension: Blood pressure well controlled  Hyperlipidemia: Continue Lipitor  Mild cognitive impairment: Continue donepezil  Disposition: Waiting for acute inpatient rehab. Lack of insurance,likely  Long length of stay    Nutrition Problem: Increased nutrient needs Etiology: post-op healing    DVT prophylaxis:SCDs Start: 05/26/22 2254  Code Status: Full Code  Family Communication: None at bedside  Patient  status:Inpatient  Patient is from :Home  Anticipated discharge to:AIR vs SNF  Estimated DC date:Not sure   Consultants: Orthopedics, IR, ID  Procedures: Joint aspiration  Antimicrobials:  Anti-infectives (From admission, onward)    Start     Dose/Rate Route Frequency Ordered Stop   06/03/22 0700  vancomycin (VANCOREADY) IVPB 750 mg/150 mL  Status:  Discontinued        750 mg 150 mL/hr over 60 Minutes Intravenous Every 12 hours 06/02/22 1848 06/06/22 1429   06/02/22 1945  vancomycin (VANCOCIN) IVPB 1000 mg/200 mL premix        1,000 mg 200 mL/hr over 60 Minutes Intravenous  Once 06/02/22 1847 06/02/22 2109   06/02/22 1930  cefTRIAXone (ROCEPHIN) 2 g in sodium chloride 0.9 % 100 mL IVPB  Status:  Discontinued        2 g 200 mL/hr over 30 Minutes Intravenous Every 24 hours 06/02/22 1835 06/06/22 1429   06/02/22 0000  ceFAZolin (ANCEF) IVPB 2g/100 mL premix  Status:  Discontinued        2 g 200 mL/hr over 30 Minutes Intravenous To Radiology 05/30/22 1504 06/02/22 1835       Subjective: Patient seen and examined at bedside today.  He was working with PT/OT.  Wayne Stokes language interpreter was also present at the bedside.    Complained of pain on the back.  objective: Vitals:   06/12/22 0714 06/12/22 1505 06/12/22 2140 06/13/22 0757  BP: 114/76 108/69 114/77 110/77  Pulse: 100 (!) 107 (!) 109 (!) 103  Resp:   16 17  Temp: (!) 97.5 F (36.4 C) (!) 97.4 F (36.3 C) 98.2 F (36.8 C) 97.9 F (36.6 C)  TempSrc: Oral Oral Oral   SpO2: 93% 94% 92% 90%  Weight:      Height:        Intake/Output Summary (Last 24 hours) at 06/13/2022 1145 Last data filed at 06/12/2022 2200 Gross per 24 hour  Intake 240 ml  Output 1400 ml  Net -1160 ml   Filed Weights   06/04/22 0315 06/06/22 0500 06/07/22 0500  Weight: 60 kg 62.8 kg 61.7 kg    Examination:   General exam: Overall comfortable, not in distress HEENT: PERRL Respiratory system:  no wheezes or crackles  Cardiovascular  system: S1 & S2 heard, RRR.  Gastrointestinal system: Abdomen is nondistended, soft and nontender. Central nervous system: Alert and oriented Extremities: No edema, no clubbing ,no cyanosis Skin: No rashes, no ulcers,no icterus   GU : foley   Data Reviewed: Stokes have personally reviewed following labs and imaging studies  CBC: Recent Labs  Lab 06/07/22 0302 06/08/22 0358 06/09/22 0149 06/11/22 0805 06/13/22 0238  WBC 9.6 10.6* 9.8 9.8 8.9  HGB 8.0* 8.3* 8.5* 8.3* 7.3*  HCT 24.6* 24.9* 25.7* 24.9* 22.5*  MCV 73.2* 71.6* 71.6* 72.2* 71.9*  PLT 404* 467* 526* 635* 630*   Basic Metabolic Panel: Recent Labs  Lab 06/07/22 0302 06/08/22 0358 06/09/22 0149 06/11/22 0805 06/12/22 0254 06/13/22 0238  NA 135 132* 130* 129* 127* 132*  K 4.3 3.6 4.2 4.0 3.8 3.6  CL 98 93* 92* 89* 88* 96*  CO2 27 27 29 30 31 27   GLUCOSE 69* 91 118* 126* 119* 103*  BUN 5* <5* 6 7 5* <5*  CREATININE 0.69 0.80 0.90 0.85 0.77 0.76  CALCIUM 9.3 9.2 9.4 10.6* 9.9 9.7  MG 1.4* 2.2  --   --   --   --  Recent Results (from the past 240 hour(s))  MRSA Next Gen by PCR, Nasal     Status: None   Collection Time: 06/04/22  9:54 AM   Specimen: Nasal Mucosa; Nasal Swab  Result Value Ref Range Status   MRSA by PCR Next Gen NOT DETECTED NOT DETECTED Final    Comment: (NOTE) The GeneXpert MRSA Assay (FDA approved for NASAL specimens only), is one component of a comprehensive MRSA colonization surveillance program. It is not intended to diagnose MRSA infection nor to guide or monitor treatment for MRSA infections. Test performance is not FDA approved in patients less than 28 years old. Performed at [facility], [address], [place], [place] [address]   Body fluid culture w Gram Stain     Status: None   Collection Time: 06/06/22  9:04 AM   Specimen: PATH Cytology Misc. fluid; Synovial Fluid  Result Value Ref Range Status   Specimen Description FLUID  Final   Special Requests CYTO MISC  Final    Gram Stain   Final    WBC PRESENT, PREDOMINANTLY PMN NO ORGANISMS SEEN CYTOSPIN SMEAR    Culture   Final    NO GROWTH 3 DAYS Performed at [facility], [address], [place], [place] [address]    Report Status 06/09/2022 FINAL  Final     Radiology Studies: No results found.  Scheduled Meds:  atorvastatin  40 mg Oral Daily   Chlorhexidine Gluconate Cloth  6 each Topical Daily   cholecalciferol  1,000 Units Oral Daily   donepezil  5 mg Oral QHS   feeding supplement (GLUCERNA SHAKE)  237 mL Oral TID BM   ferrous sulfate  325 mg Oral Q breakfast   gabapentin  200 mg Oral TID   insulin aspart  0-9 Units Subcutaneous TID WC   lidocaine  1 patch Transdermal Q24H   magnesium gluconate  500 mg Oral BID   metoprolol succinate  12.5 mg Oral Daily   morphine  15 mg Oral Q12H   polyethylene glycol  17 g Oral BID   senna-docusate  1 tablet Oral BID   tamsulosin  0.4 mg Oral Daily   Continuous Infusions:     LOS: 18 days   Burnadette Pop, MD [facility] P10/02/2022, 11:45 AM

## 2022-06-14 LAB — BASIC METABOLIC PANEL
Anion gap: 8 (ref 5–15)
BUN: <5 mg/dL — ABNORMAL LOW (ref 6–20)
CO2: 28 mmol/L (ref 22–32)
Calcium: 9.7 mg/dL (ref 8.9–10.3)
Chloride: 94 mmol/L — ABNORMAL LOW (ref 98–111)
Creatinine, Ser: 0.8 mg/dL (ref 0.61–1.24)
GFR, Estimated: >60 mL/min (ref >60)
Glucose, Bld: 94 mg/dL (ref 70–99)
Potassium: 3.7 mmol/L (ref 3.5–5.1)
Sodium: 130 mmol/L — ABNORMAL LOW (ref 135–145)

## 2022-06-14 LAB — CBC
HCT: 21.9 % — ABNORMAL LOW (ref 39.0–52.0)
Hemoglobin: 7.2 g/dL — ABNORMAL LOW (ref 13.0–17.0)
MCH: 23.5 pg — ABNORMAL LOW (ref 26.0–34.0)
MCHC: 32.9 g/dL (ref 30.0–36.0)
MCV: 71.6 fL — ABNORMAL LOW (ref 80.0–100.0)
Platelets: 735 10*3/uL — ABNORMAL HIGH (ref 150–400)
RBC: 3.06 MIL/uL — ABNORMAL LOW (ref 4.22–5.81)
RDW: 18.9 % — ABNORMAL HIGH (ref 11.5–15.5)
WBC: 8.1 10*3/uL (ref 4.0–10.5)
nRBC: 0 % (ref 0.0–0.2)

## 2022-06-14 LAB — GLUCOSE, CAPILLARY
Glucose-Capillary: 94 mg/dL (ref 70–99)
Glucose-Capillary: 139 mg/dL — ABNORMAL HIGH (ref 70–99)
Glucose-Capillary: 66 mg/dL — ABNORMAL LOW (ref 70–99)
Glucose-Capillary: 91 mg/dL (ref 70–99)

## 2022-06-14 MED ORDER — LIDOCAINE 5 % EX PTCH
1.0000 | MEDICATED_PATCH | CUTANEOUS | Status: DC
  Administered 2022-06-14 – 2022-08-01 (×44): 1 via TRANSDERMAL
  Filled 2022-06-14 (×49): qty 1

## 2022-06-14 MED ORDER — LIDOCAINE 5 % EX PTCH: 1.0000 | MEDICATED_PATCH | CUTANEOUS | Status: DC

## 2022-06-14 NOTE — Progress Notes (Signed)
Pt Stool softer given, but had not been able to have a BM.  He is passing gas and feels like he may have to go soon. Will paas on in report to collect Occult blood card.

## 2022-06-14 NOTE — Progress Notes (Signed)
PROGRESS NOTE  Wayne Stokes  XTK:[id number] DOB: 07-11-1966 DOA: 05/26/2022 PCP: [name], NP   Brief Narrative:  Patient is a 56 year old male from [place] who peaks [place], with history of diabetes type 2, hypertension, lumbar disc degenerative disease with spinal stenosis/ claudication, T12 vertebral compression fracture, mild cognitive impairment who was admitted for the management of uncontrolled low back pain, ambulatory difficulty due to severe lumbar spine stenosis and T12 compression fracture.  Orthopedics, neurosurgery evaluated the patient and recommended TLSO brace, pain control and outpatient follow-up.  PT recommending acute inpatient rehab.  Difficult placement due to lack of insurance.LLOS. IR planning for T12 kyphoplasty on Monday  Assessment & Plan:  Principal Problem:   Pyogenic arthritis of right shoulder region [facility]) Active Problems:   Spinal stenosis of lumbar region with neurogenic claudication   T12 compression fracture (HCC)   AKI (acute kidney injury) ([place])   Hyponatremia   Acute urinary retention   Type 2 diabetes mellitus without complication, without long-term current use of insulin (HCC)   Leukocytosis   Hypertension associated with diabetes ([place])   Iron deficiency anemia   Hyperlipidemia associated with type 2 diabetes mellitus (HCC)   Mild cognitive impairment   Thrush, oral   Right shoulder pain   Discitis of lumbar region   Chronic low back pain with bilateral sciatica   Severe back pain/lumbar spinal stenosis/T12 compression fracture: Presented with severe low back pain, ambulatory dysfunction.  MRI did not show any cord injury.  Neurosurgery consulted, no surgical intervention recommended, recommended TLSO brace.  IR was also consulted for consideration of T12 kyphoplasty but this was canceled due to fever.  Requested for kyphoplasty again because he does not have any signs of sepsis.  IR planning for Monday. PT/OT recommending acute  inpatient rehab on discharge.  Fever/leukocytosis: Unclear source. Now resolved. Developed leukocytosis, tachycardia, fever.  Chest x-ray did not show any pneumonia.  COVID-negative.  MRI of the spine on 9/18 was concerning for infection/discitis at L4 level.  Disc Aspiration was completed on 9/25 by IR.  He started on IV vancomycin, ceftriaxone.  Culture from spinal aspiration negative.  Patient had ongoing pain on the right shoulder, MRI was done which was suggestive of septic arthritis.  Orthopedics aspirated the shoulder on 9/27 with no fluid.  IR aspirated right shoulder on 9/29 without finding of any infection.  ID was also consulted, antibiotics discontinued.  Currently off antibiotics.   Right shoulder pain: Persistent.  Continue local anesthetics, oral pain medications.  Consideration of steroid injection in the shoulder as an outpatient.  Continue pain management.  Microcytic  iron deficiency anemia: Continue iron supplementation.  No evidence of active bleeding.  Denies any hematochezia or melena.  We are also checking FOBT.  Transfuse with a unit of PRBC on 9/28.  Continue monitoring,Hb in the range of 7  Acute urinary retention/AKI: Felt to be secondary to back pain, mobility.  Started on Flomax.  Renal function is stabilized.  Voiding trial was given on 7/1 but again retained.  Foley in place.  We recommend follow-up with urology as an outpatient  Constipation: Continue bowel regimen  Vitamin D deficiency: Continue supplementation  Hyponatremia :SIADH panel mostly negative.  Urine sodium less than 10.  Started on normal saline with improvement.Fluid stopped now  Diabetes type 2: Recent A1c of 7.4.  Monitor blood sugars  Hypertension: Blood pressure well controlled  Hyperlipidemia: Continue Lipitor  Mild cognitive impairment: Continue donepezil  Disposition: Waiting for acute inpatient rehab. Lack  of insurance,likely  Long length of stay    Nutrition Problem: Increased  nutrient needs Etiology: post-op healing    DVT prophylaxis:SCDs Start: 05/26/22 2254     Code Status: Full Code  Family Communication: Cousin at bedside  Patient status:Inpatient  Patient is from :Home  Anticipated discharge to:AIR vs SNF  Estimated DC date:Not sure   Consultants: Orthopedics, IR, ID  Procedures: Joint aspiration  Antimicrobials:  Anti-infectives (From admission, onward)    Start     Dose/Rate Route Frequency Ordered Stop   06/03/22 0700  vancomycin (VANCOREADY) IVPB 750 mg/150 mL  Status:  Discontinued        750 mg 150 mL/hr over 60 Minutes Intravenous Every 12 hours 06/02/22 1848 06/06/22 1429   06/02/22 1945  vancomycin (VANCOCIN) IVPB 1000 mg/200 mL premix        1,000 mg 200 mL/hr over 60 Minutes Intravenous  Once 06/02/22 1847 06/02/22 2109   06/02/22 1930  cefTRIAXone (ROCEPHIN) 2 g in sodium chloride 0.9 % 100 mL IVPB  Status:  Discontinued        2 g 200 mL/hr over 30 Minutes Intravenous Every 24 hours 06/02/22 1835 06/06/22 1429   06/02/22 0000  ceFAZolin (ANCEF) IVPB 2g/100 mL premix  Status:  Discontinued        2 g 200 mL/hr over 30 Minutes Intravenous To Radiology 05/30/22 1504 06/02/22 1835       Subjective: Patient seen and examined at bedside today.  Causing at bedside today.  Remains hemodynamically stable, lying in bed.  Not eager to move that much.  Complains of back pain, right shoulder pain.    objective: Vitals:   06/13/22 1449 06/13/22 2008 06/14/22 0432 06/14/22 0757  BP: (!) 144/92 113/85 101/82 (!) 113/93  Pulse: 100 (!) 101  97  Resp: 17   17  Temp: 97.7 F (36.5 C) 97.8 F (36.6 C)  97.8 F (36.6 C)  TempSrc: Oral     SpO2: 91% 95%  95%  Weight:      Height:        Intake/Output Summary (Last 24 hours) at 06/14/2022 1100 Last data filed at 06/14/2022 0500 Gross per 24 hour  Intake 480 ml  Output 900 ml  Net -420 ml   Filed Weights   06/04/22 0315 06/06/22 0500 06/07/22 0500  Weight: 60 kg 62.8 kg  61.7 kg    Examination:   General exam: Overall comfortable, not in distress HEENT: PERRL Respiratory system:  no wheezes or crackles  Cardiovascular system: S1 & S2 heard, RRR.  Gastrointestinal system: Abdomen is nondistended, soft and nontender. Central nervous system: Alert and oriented Extremities: No edema, no clubbing ,no cyanosis Skin: No rashes, no ulcers,no icterus   GU: Foley   Data Reviewed: I have personally reviewed following labs and imaging studies  CBC: Recent Labs  Lab 06/08/22 0358 06/09/22 0149 06/11/22 0805 06/13/22 0238 06/14/22 0216  WBC 10.6* 9.8 9.8 8.9 8.1  HGB 8.3* 8.5* 8.3* 7.3* 7.2*  HCT 24.9* 25.7* 24.9* 22.5* 21.9*  MCV 71.6* 71.6* 72.2* 71.9* 71.6*  PLT 467* 526* 635* 742* 735*   Basic Metabolic Panel: Recent Labs  Lab 06/08/22 0358 06/09/22 0149 06/11/22 0805 06/12/22 0254 06/13/22 0238 06/14/22 0216  NA 132* 130* 129* 127* 132* 130*  K 3.6 4.2 4.0 3.8 3.6 3.7  CL 93* 92* 89* 88* 96* 94*  CO2 27 29 30 31 27 28   GLUCOSE 91 118* 126* 119* 103* 94  BUN <5* 6  7 5* <5* <5*  CREATININE 0.80 0.90 0.85 0.77 0.76 0.80  CALCIUM 9.2 9.4 10.6* 9.9 9.7 9.7  MG 2.2  --   --   --   --   --      Recent Results (from the past 240 hour(s))  Body fluid culture w Gram Stain     Status: None   Collection Time: 06/06/22  9:04 AM   Specimen: PATH Cytology Misc. fluid; Synovial Fluid  Result Value Ref Range Status   Specimen Description FLUID  Final   Special Requests CYTO MISC  Final   Gram Stain   Final    WBC PRESENT, PREDOMINANTLY PMN NO ORGANISMS SEEN CYTOSPIN SMEAR    Culture   Final    NO GROWTH 3 DAYS Performed at [facility], [address], [place], [place] [address]    Report Status 06/09/2022 FINAL  Final     Radiology Studies: No results found.  Scheduled Meds:  atorvastatin  40 mg Oral Daily   Chlorhexidine Gluconate Cloth  6 each Topical Daily   cholecalciferol  1,000 Units Oral Daily   donepezil  5 mg Oral  QHS   feeding supplement (GLUCERNA SHAKE)  237 mL Oral TID BM   ferrous sulfate  325 mg Oral Q breakfast   gabapentin  200 mg Oral TID   insulin aspart  0-9 Units Subcutaneous TID WC   lidocaine  1 patch Transdermal Q24H   lidocaine  1 patch Transdermal Q24H   magnesium gluconate  500 mg Oral BID   metoprolol succinate  12.5 mg Oral Daily   morphine  15 mg Oral Q12H   polyethylene glycol  17 g Oral BID   senna-docusate  1 tablet Oral BID   tamsulosin  0.4 mg Oral Daily   Continuous Infusions:     LOS: 19 days   Burnadette Pop, MD [facility] P10/03/2022, 11:00 AM

## 2022-06-14 NOTE — Plan of Care (Signed)
  Problem: Health Behavior/Discharge Planning: Goal: Ability to manage health-related needs will improve Outcome: Progressing   Problem: Clinical Measurements: Goal: Diagnostic test results will improve Outcome: Progressing   Problem: Activity: Goal: Risk for activity intolerance will decrease Outcome: Progressing   Problem: Coping: Goal: Level of anxiety will decrease Outcome: Progressing   Problem: Elimination: Goal: Will not experience complications related to bowel motility Outcome: Progressing Goal: Will not experience complications related to urinary retention Outcome: Progressing

## 2022-06-15 LAB — BASIC METABOLIC PANEL
Anion gap: 7 (ref 5–15)
BUN: 5 mg/dL — ABNORMAL LOW (ref 6–20)
CO2: 29 mmol/L (ref 22–32)
Calcium: 9.5 mg/dL (ref 8.9–10.3)
Chloride: 94 mmol/L — ABNORMAL LOW (ref 98–111)
Creatinine, Ser: 0.76 mg/dL (ref 0.61–1.24)
GFR, Estimated: >60 mL/min (ref >60)
Glucose, Bld: 80 mg/dL (ref 70–99)
Potassium: 3.5 mmol/L (ref 3.5–5.1)
Sodium: 130 mmol/L — ABNORMAL LOW (ref 135–145)

## 2022-06-15 LAB — GLUCOSE, CAPILLARY
Glucose-Capillary: 84 mg/dL (ref 70–99)
Glucose-Capillary: 87 mg/dL (ref 70–99)
Glucose-Capillary: 98 mg/dL (ref 70–99)
Glucose-Capillary: 109 mg/dL — ABNORMAL HIGH (ref 70–99)
Glucose-Capillary: 100 mg/dL — ABNORMAL HIGH (ref 70–99)

## 2022-06-15 LAB — CBC
HCT: 22.3 % — ABNORMAL LOW (ref 39.0–52.0)
Hemoglobin: 7.5 g/dL — ABNORMAL LOW (ref 13.0–17.0)
MCH: 24 pg — ABNORMAL LOW (ref 26.0–34.0)
MCHC: 33.6 g/dL (ref 30.0–36.0)
MCV: 71.2 fL — ABNORMAL LOW (ref 80.0–100.0)
Platelets: 733 10*3/uL — ABNORMAL HIGH (ref 150–400)
RBC: 3.13 MIL/uL — ABNORMAL LOW (ref 4.22–5.81)
RDW: 18.8 % — ABNORMAL HIGH (ref 11.5–15.5)
WBC: 8.6 10*3/uL (ref 4.0–10.5)
nRBC: 0 % (ref 0.0–0.2)

## 2022-06-15 NOTE — Progress Notes (Signed)
PROGRESS NOTE  Wayne Stokes  FUX:[id number] DOB: 1966/08/02 DOA: 05/26/2022 PCP: [name], NP   Brief Narrative:  Patient is a 56 year old male from [place] who peaks [place], with history of diabetes type 2, hypertension, lumbar disc degenerative disease with spinal stenosis/ claudication, T12 vertebral compression fracture, mild cognitive impairment who was admitted for the management of uncontrolled low back pain, ambulatory difficulty due to severe lumbar spine stenosis and T12 compression fracture.  Orthopedics, neurosurgery evaluated the patient and recommended TLSO brace, pain control and outpatient follow-up.  PT recommending acute inpatient rehab.  Difficult placement due to lack of insurance.LLOS. IR planning for T12 kyphoplasty on Monday  Assessment & Plan:  Principal Problem:   Pyogenic arthritis of right shoulder region [facility]) Active Problems:   Spinal stenosis of lumbar region with neurogenic claudication   T12 compression fracture (HCC)   AKI (acute kidney injury) ([place])   Hyponatremia   Acute urinary retention   Type 2 diabetes mellitus without complication, without long-term current use of insulin (HCC)   Leukocytosis   Hypertension associated with diabetes ([place])   Iron deficiency anemia   Hyperlipidemia associated with type 2 diabetes mellitus (HCC)   Mild cognitive impairment   Thrush, oral   Right shoulder pain   Discitis of lumbar region   Chronic low back pain with bilateral sciatica   Severe back pain/lumbar spinal stenosis/T12 compression fracture: Presented with severe low back pain, ambulatory dysfunction.  MRI did not show any cord injury.  Neurosurgery consulted, no surgical intervention recommended, recommended TLSO brace.  IR was also consulted for consideration of T12 kyphoplasty but this was canceled due to fever.  Requested for kyphoplasty again because he does not have any signs of sepsis.  IR planning for Monday. PT/OT recommending acute  inpatient rehab on discharge.  Fever/leukocytosis: Unclear source. Now resolved. Developed leukocytosis, tachycardia, fever.  Chest x-ray did not show any pneumonia.  COVID-negative.  MRI of the spine on 9/18 was concerning for infection/discitis at L4 level.  Disc Aspiration was completed on 9/25 by IR.  He started on IV vancomycin, ceftriaxone.  Culture from spinal aspiration negative.  Patient had ongoing pain on the right shoulder, MRI was done which was suggestive of septic arthritis.  Orthopedics aspirated the shoulder on 9/27 with no fluid.  IR aspirated right shoulder on 9/29 without finding of any infection.  ID was also consulted, antibiotics discontinued.  Currently off antibiotics.   Right shoulder pain: Persistent.  Continue local anesthetics, oral pain medications.  Consideration of steroid injection in the shoulder as an outpatient.  Continue pain management.  Microcytic  iron deficiency anemia: Continue iron supplementation.  No evidence of active bleeding.  Denies any hematochezia or melena.  We are also checking FOBT.  Transfuse with a unit of PRBC on 9/28.  Continue monitoring,Hb in the range of 7  Acute urinary retention/AKI: Felt to be secondary to back pain, mobility.  Started on Flomax.  Renal function is stabilized.  Voiding trial was given on 7/1 but again retained.  Foley in place.  We recommend follow-up with urology as an outpatient.  We may give again voiding trial during this hospitalization.  Constipation: Continue bowel regimen  Vitamin D deficiency: Continue supplementation  Hyponatremia :SIADH panel mostly negative.  Urine sodium less than 10.  Started on normal saline with improvement.Fluid stopped now  Diabetes type 2: Recent A1c of 7.4.  Monitor blood sugars  Hypertension: Blood pressure well controlled  Hyperlipidemia: Continue Lipitor  Mild cognitive impairment:  Continue donepezil  Disposition: Waiting for acute inpatient rehab. Lack of insurance,likely   Long length of stay    Nutrition Problem: Increased nutrient needs Etiology: post-op healing    DVT prophylaxis:SCDs Start: 05/26/22 2254     Code Status: Full Code  Family Communication: Cousin at bedside on 10/7  Patient status:Inpatient  Patient is from :Home  Anticipated discharge to:AIR vs SNF  Estimated DC date:Not sure   Consultants: Orthopedics, IR, ID  Procedures: Joint aspiration  Antimicrobials:  Anti-infectives (From admission, onward)    Start     Dose/Rate Route Frequency Ordered Stop   06/03/22 0700  vancomycin (VANCOREADY) IVPB 750 mg/150 mL  Status:  Discontinued        750 mg 150 mL/hr over 60 Minutes Intravenous Every 12 hours 06/02/22 1848 06/06/22 1429   06/02/22 1945  vancomycin (VANCOCIN) IVPB 1000 mg/200 mL premix        1,000 mg 200 mL/hr over 60 Minutes Intravenous  Once 06/02/22 1847 06/02/22 2109   06/02/22 1930  cefTRIAXone (ROCEPHIN) 2 g in sodium chloride 0.9 % 100 mL IVPB  Status:  Discontinued        2 g 200 mL/hr over 30 Minutes Intravenous Every 24 hours 06/02/22 1835 06/06/22 1429   06/02/22 0000  ceFAZolin (ANCEF) IVPB 2g/100 mL premix  Status:  Discontinued        2 g 200 mL/hr over 30 Minutes Intravenous To Radiology 05/30/22 1504 06/02/22 1835       Subjective: Patient seen and examined at bedside today.  Complains of back pain.  No other complaints  objective: Vitals:   06/14/22 1500 06/14/22 2028 06/15/22 0500 06/15/22 0515  BP: 115/84 119/79 122/66 107/68  Pulse: 96 99 96 (!) 102  Resp: 18 17 14 15   Temp: 98 F (36.7 C) 99.6 F (37.6 C)  98.8 F (37.1 C)  TempSrc: Oral Oral    SpO2: 98% 90% 100% 92%  Weight:      Height:        Intake/Output Summary (Last 24 hours) at 06/15/2022 1049 Last data filed at 06/15/2022 0500 Gross per 24 hour  Intake 480 ml  Output 2500 ml  Net -2020 ml   Filed Weights   06/04/22 0315 06/06/22 0500 06/07/22 0500  Weight: 60 kg 62.8 kg 61.7 kg    Examination:   General  exam: Overall comfortable, not in distress HEENT: PERRL Respiratory system:  no wheezes or crackles  Cardiovascular system: S1 & S2 heard, RRR.  Gastrointestinal system: Abdomen is nondistended, soft and nontender. Central nervous system: Alert and oriented Extremities: No edema, no clubbing ,no cyanosis Skin: No rashes, no ulcers,no icterus   GU: Foley   Data Reviewed: I have personally reviewed following labs and imaging studies  CBC: Recent Labs  Lab 06/09/22 0149 06/11/22 0805 06/13/22 0238 06/14/22 0216 06/15/22 0319  WBC 9.8 9.8 8.9 8.1 8.6  HGB 8.5* 8.3* 7.3* 7.2* 7.5*  HCT 25.7* 24.9* 22.5* 21.9* 22.3*  MCV 71.6* 72.2* 71.9* 71.6* 71.2*  PLT 526* 635* 742* 735* 733*   Basic Metabolic Panel: Recent Labs  Lab 06/11/22 0805 06/12/22 0254 06/13/22 0238 06/14/22 0216 06/15/22 0319  NA 129* 127* 132* 130* 130*  K 4.0 3.8 3.6 3.7 3.5  CL 89* 88* 96* 94* 94*  CO2 30 31 27 28 29   GLUCOSE 126* 119* 103* 94 80  BUN 7 5* <5* <5* 5*  CREATININE 0.85 0.77 0.76 0.80 0.76  CALCIUM 10.6* 9.9 9.7 9.7 9.5  Recent Results (from the past 240 hour(s))  Body fluid culture w Gram Stain     Status: None   Collection Time: 06/06/22  9:04 AM   Specimen: PATH Cytology Misc. fluid; Synovial Fluid  Result Value Ref Range Status   Specimen Description FLUID  Final   Special Requests CYTO MISC  Final   Gram Stain   Final    WBC PRESENT, PREDOMINANTLY PMN NO ORGANISMS SEEN CYTOSPIN SMEAR    Culture   Final    NO GROWTH 3 DAYS Performed at [facility], [address] [place], [place] [address]    Report Status 06/09/2022 FINAL  Final     Radiology Studies: No results found.  Scheduled Meds:  atorvastatin  40 mg Oral Daily   Chlorhexidine Gluconate Cloth  6 each Topical Daily   cholecalciferol  1,000 Units Oral Daily   donepezil  5 mg Oral QHS   feeding supplement (GLUCERNA SHAKE)  237 mL Oral TID BM   ferrous sulfate  325 mg Oral Q breakfast   gabapentin   200 mg Oral TID   insulin aspart  0-9 Units Subcutaneous TID WC   lidocaine  1 patch Transdermal Q24H   lidocaine  1 patch Transdermal Q24H   magnesium gluconate  500 mg Oral BID   metoprolol succinate  12.5 mg Oral Daily   morphine  15 mg Oral Q12H   polyethylene glycol  17 g Oral BID   senna-docusate  1 tablet Oral BID   tamsulosin  0.4 mg Oral Daily   Continuous Infusions:     LOS: 20 days   Burnadette Pop, MD [facility] P10/04/2022, 10:49 AM

## 2022-06-16 LAB — CBC
HCT: 26.8 % — ABNORMAL LOW (ref 39.0–52.0)
Hemoglobin: 8.9 g/dL — ABNORMAL LOW (ref 13.0–17.0)
MCH: 23.4 pg — ABNORMAL LOW (ref 26.0–34.0)
MCHC: 33.2 g/dL (ref 30.0–36.0)
MCV: 70.5 fL — ABNORMAL LOW (ref 80.0–100.0)
Platelets: 864 10*3/uL — ABNORMAL HIGH (ref 150–400)
RBC: 3.8 MIL/uL — ABNORMAL LOW (ref 4.22–5.81)
RDW: 18.6 % — ABNORMAL HIGH (ref 11.5–15.5)
WBC: 11.3 10*3/uL — ABNORMAL HIGH (ref 4.0–10.5)
nRBC: 0 % (ref 0.0–0.2)

## 2022-06-16 LAB — URINALYSIS, ROUTINE W REFLEX MICROSCOPIC
Bilirubin Urine: NEGATIVE
Glucose, UA: NEGATIVE mg/dL
Hgb urine dipstick: NEGATIVE
Ketones, ur: 5 mg/dL — AB
Leukocytes,Ua: NEGATIVE
Nitrite: NEGATIVE
Protein, ur: NEGATIVE mg/dL
Specific Gravity, Urine: 1.004 — ABNORMAL LOW (ref 1.005–1.030)
pH: 6 (ref 5.0–8.0)

## 2022-06-16 LAB — GLUCOSE, CAPILLARY
Glucose-Capillary: 82 mg/dL (ref 70–99)
Glucose-Capillary: 113 mg/dL — ABNORMAL HIGH (ref 70–99)
Glucose-Capillary: 122 mg/dL — ABNORMAL HIGH (ref 70–99)
Glucose-Capillary: 145 mg/dL — ABNORMAL HIGH (ref 70–99)

## 2022-06-16 LAB — BASIC METABOLIC PANEL
Anion gap: 13 (ref 5–15)
BUN: <5 mg/dL — ABNORMAL LOW (ref 6–20)
CO2: 28 mmol/L (ref 22–32)
Calcium: 10.3 mg/dL (ref 8.9–10.3)
Chloride: 92 mmol/L — ABNORMAL LOW (ref 98–111)
Creatinine, Ser: 0.88 mg/dL (ref 0.61–1.24)
GFR, Estimated: >60 mL/min (ref >60)
Glucose, Bld: 83 mg/dL (ref 70–99)
Potassium: 3.3 mmol/L — ABNORMAL LOW (ref 3.5–5.1)
Sodium: 133 mmol/L — ABNORMAL LOW (ref 135–145)

## 2022-06-16 MED ORDER — POTASSIUM CHLORIDE 10 MEQ/100ML IV SOLN
10.0000 meq | INTRAVENOUS | Status: AC
  Administered 2022-06-16 (×2): 10 meq via INTRAVENOUS
  Filled 2022-06-16: qty 100

## 2022-06-16 NOTE — Progress Notes (Signed)
Pulled patients Foley catheter, patient complained of moderate pain. Also initiated yellow mews protocol on patient for his HR. Patient was not in distress.

## 2022-06-16 NOTE — Progress Notes (Signed)
Occupational Therapy Treatment Patient Details Name: Wayne Stokes MRN: [id number] DOB: 31-Oct-1965 Today's Date: 06/16/2022   History of present illness 56 y.o. male presented 05/26/22 with c/o severe and progressively worsening low back pain that radiates into his left buttock and down his LLE. Imaging +acute T12 compression fracture and  L4/5 there is multifactorial severe spinal stenosis and severe bilateral foraminal stenosis. Neurosurgery consult with no acute surgical intervention needed. IR consulted for possible kyphoplasty but has been delayed due to fever/SIRS and possible septic arthritis bil shoulder. Plan for kyphoplasty 06/16/22. 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   OT comments  Pt progressing towards established OT goals and demonstrating increased activity tolerance this session. Pt adjusting his socks at bed level while using figure four method; educating on good technique for adherence to back precautions. Pt requiring Min A and Max cues for donning of back brace (needing to readjust to tighten once pt donned). Pt performing sit<>stand with Mod A and RW; increased encouragement for mobility in hallway. Continue to recommend dc to AIR and will continue to follow acutely as admitted. In-person interpreter present throughout.    Recommendations for follow up therapy are one component of a multi-disciplinary discharge planning process, led by the attending physician.  Recommendations may be updated based on patient status, additional functional criteria and insurance authorization.    Follow Up Recommendations  Acute inpatient rehab (3hours/day)    Assistance Recommended at Discharge Frequent or constant Supervision/Assistance  Patient can return home with the following  A lot of help with walking and/or transfers;A lot of help with bathing/dressing/bathroom   Equipment  Recommendations  BSC/3in1    Recommendations for Other Services      Precautions / Restrictions Precautions Precautions: Fall;Back Precaution Booklet Issued: No Precaution Comments: reviewed BLT back rule for pt comfort Required Braces or Orthoses: Spinal Brace Spinal Brace: Thoracolumbosacral orthotic;Applied in sitting position Restrictions Weight Bearing Restrictions: No       Mobility Bed Mobility Overal bed mobility: Needs Assistance Bed Mobility: Rolling, Sidelying to Sit Rolling: Min assist Sidelying to sit: Min assist       General bed mobility comments: Min A to faciltiating hips and then elevate trunk    Transfers Overall transfer level: Needs assistance Equipment used: Rolling walker (2 wheels) Transfers: Sit to/from Stand, Bed to chair/wheelchair/BSC Sit to Stand: Mod assist           General transfer comment: Mod A to power to standing with cues for hand placement/technique as pt pulling up on RW, stood from EOB x1, from chair x1. Transferred to chair post ambulation.     Balance Overall balance assessment: Needs assistance, History of Falls Sitting-balance support: Feet supported, Single extremity supported Sitting balance-Leahy Scale: Fair     Standing balance support: During functional activity, Reliant on assistive device for balance, Bilateral upper extremity supported Standing balance-Leahy Scale: Poor Standing balance comment: reliant on external support for balance and UE support.                           ADL either performed or assessed with clinical judgement   ADL Overall ADL's : Needs assistance/impaired                 Upper Body Dressing : Minimal assistance;Sitting;Cueing for compensatory techniques;Cueing for sequencing Upper Body Dressing Details (indicate cue type and reason): Pt donning brace  with assistance for placement and then locating clips for over shoulder straps. Pt benefiting from increased  encouragement; pt stating "I can't" but then able to participate. DId have to readjust and tighten brace as pt making too loose.   Lower Body Dressing Details (indicate cue type and reason): Pt adjusting socks using figure four method while supine in bed Toilet Transfer: Moderate assistance;+2 for safety/equipment;Rolling walker (2 wheels) (simulated to recliner)           Functional mobility during ADLs: Minimal assistance;Rolling walker (2 wheels);+2 for physical assistance General ADL Comments: Focused session on compensatory techniques and brace management. Then pt performing functional mobiltiy in hallway.    Extremity/Trunk Assessment Upper Extremity Assessment Upper Extremity Assessment: RUE deficits/detail;LUE deficits/detail RUE Deficits / Details: right shoulder pain. Decreased grasp strength and finger dexerity RUE Coordination: decreased fine motor;decreased gross motor LUE Deficits / Details: increased shoulder pain. Decreased grasp strength and finger dexerity LUE Coordination: decreased fine motor;decreased gross motor   Lower Extremity Assessment Lower Extremity Assessment: Defer to PT evaluation RLE: Unable to fully assess due to pain LLE: Unable to fully assess due to pain        Vision       Perception     Praxis      Cognition Arousal/Alertness: Awake/alert Behavior During Therapy: WFL for tasks assessed/performed Overall Cognitive Status: Impaired/Different from baseline Area of Impairment: Problem solving, Following commands, Safety/judgement                       Following Commands: Follows one step commands with increased time     Problem Solving: Requires verbal cues General Comments: Pt requiring increased cues throughout. Easily distracted by pain. Better able to follow commands today with encouragement/repetition. "i can't walk"        Exercises      Shoulder Instructions       General Comments In person interpreter present  throughout.    Pertinent Vitals/ Pain       Pain Assessment Pain Assessment: Faces Faces Pain Scale: Hurts whole lot Pain Location: back, RLE Pain Descriptors / Indicators: Cramping, Discomfort, Grimacing, Guarding, Moaning Pain Intervention(s): Monitored during session, Limited activity within patient's tolerance, Repositioned  Home Living                                          Prior Functioning/Environment              Frequency  Min 2X/week        Progress Toward Goals  OT Goals(current goals can now be found in the care plan section)  Progress towards OT goals: Progressing toward goals  Acute Rehab OT Goals OT Goal Formulation: With patient Time For Goal Achievement: 06/26/22 Potential to Achieve Goals: Good ADL Goals Pt Will Perform Grooming: with min guard assist;standing Pt Will Perform Lower Body Dressing: sit to/from stand;with adaptive equipment;with min guard assist Pt Will Transfer to Toilet: with min guard assist;ambulating;bedside commode Additional ADL Goal #1: Pt will independently demonstrate 3/3 back precautions during ADLs Additional ADL Goal #2: Pt will independent don/doff brace in preparation for ADLs  Plan Discharge plan remains appropriate    Co-evaluation    PT/OT/SLP Co-Evaluation/Treatment: Yes Reason for Co-Treatment: For patient/therapist safety;To address functional/ADL transfers;Other (comment) (use of interpreter; pain management)   OT goals addressed during session: ADL's and self-care  AM-PAC OT "6 Clicks" Daily Activity     Outcome Measure   Help from another person eating meals?: A Little Help from another person taking care of personal grooming?: A Little Help from another person toileting, which includes using toliet, bedpan, or urinal?: A Lot Help from another person bathing (including washing, rinsing, drying)?: A Lot Help from another person to put on and taking off regular upper body  clothing?: A Lot Help from another person to put on and taking off regular lower body clothing?: A Lot 6 Click Score: 14    End of Session Equipment Utilized During Treatment: Back brace;Rolling walker (2 wheels)  OT Visit Diagnosis: Other abnormalities of gait and mobility (R26.89);Muscle weakness (generalized) (M62.81);Pain Pain - Right/Left: Right Pain - part of body: Shoulder   Activity Tolerance Patient limited by pain   Patient Left in chair;with call bell/phone within reach;with chair alarm set   Nurse Communication Mobility status        Time: 6144-3154 OT Time Calculation (min): 30 min  Charges: OT General Charges $OT Visit: 1 Visit OT Treatments $Self Care/Home Management : 8-22 mins  Grant Swager MSOT, OTR/L Acute Rehab Office: [phone]  Theodoro Grist Chrislynn Mosely 06/16/2022, 2:09 PM

## 2022-06-16 NOTE — Progress Notes (Signed)
Physical Therapy Treatment Patient Details Name: Wayne Stokes MRN: [id number] DOB: 1966/02/18 Today's Date: 06/16/2022   History of Present Illness 56 y.o. male presented 05/26/22 with c/o severe and progressively worsening low back pain that radiates into his left buttock and down his LLE. Imaging +acute T12 compression fracture and  L4/5 there is multifactorial severe spinal stenosis and severe bilateral foraminal stenosis. Neurosurgery consult with no acute surgical intervention needed. IR consulted for possible kyphoplasty but has been delayed due to fever/SIRS and possible septic arthritis bil shoulder. Plan for kyphoplasty 06/16/22. 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    PT Comments    Patient progressing well towards PT goals. Session focused on gait training and transfers. Requires Mod A to stand from all surfaces and Mod A for gait training with assist for balance and RW management. Chair follow needed due to fatigue and pain. Pt with flexed posture and limited by pain and weakness. Does well being pushed and with encouragement.  Is tolerating more activity this session. Will follow up post kyphoplasty. Continues to be appropriate for AIR. Will follow.   Recommendations for follow up therapy are one component of a multi-disciplinary discharge planning process, led by the attending physician.  Recommendations may be updated based on patient status, additional functional criteria and insurance authorization.  Follow Up Recommendations  Acute inpatient rehab (3hours/day) Can patient physically be transported by private vehicle: No   Assistance Recommended at Discharge Frequent or constant Supervision/Assistance  Patient can return home with the following A lot of help with bathing/dressing/bathroom;Assistance with cooking/housework;Assist for transportation;Help with stairs or ramp  for entrance;Two people to help with walking and/or transfers   Equipment Recommendations  BSC/3in1;Rolling walker (2 wheels)    Recommendations for Other Services       Precautions / Restrictions Precautions Precautions: Fall;Back Precaution Booklet Issued: No Required Braces or Orthoses: Spinal Brace Spinal Brace: Thoracolumbosacral orthotic;Applied in sitting position Restrictions Weight Bearing Restrictions: No     Mobility  Bed Mobility               General bed mobility comments: Sitting EOB upon PT arrival.    Transfers Overall transfer level: Needs assistance Equipment used: Rolling walker (2 wheels) Transfers: Sit to/from Stand, Bed to chair/wheelchair/BSC Sit to Stand: Mod assist           General transfer comment: Mod A to power to standing with cues for hand placement/technique as pt pulling up on RW, stood from EOB x1, from chair x1. Transferred to chair post ambulation.    Ambulation/Gait Ambulation/Gait assistance: Mod assist, +2 safety/equipment Gait Distance (Feet): 20 Feet (x2 bouts) Assistive device: Rolling walker (2 wheels) Gait Pattern/deviations: Trunk flexed, Knee flexed in stance - left, Knee flexed in stance - right, Step-to pattern, Step-through pattern Gait velocity: decr     General Gait Details: Slow, unsteady gait with flexed posture at hips/knees/back with bil knee instability, "hold me , help me walk." Some inconsistent behaviors noted depending on amount of support provided. Mod A for balance, cues for RW proximity/management. 1 seated rest break.   Stairs             Wheelchair Mobility    Modified Rankin (Stroke Patients Only)       Balance Overall balance assessment: Needs assistance, History of Falls Sitting-balance support: Feet supported, Single extremity supported Sitting balance-Leahy Scale: Fair     Standing balance support: During  functional activity, Reliant on assistive device for balance,  Bilateral upper extremity supported Standing balance-Leahy Scale: Poor Standing balance comment: reliant on external support for balance and UE support.                            Cognition Arousal/Alertness: Awake/alert Behavior During Therapy: WFL for tasks assessed/performed Overall Cognitive Status: Impaired/Different from baseline Area of Impairment: Problem solving, Following commands, Safety/judgement                       Following Commands: Follows one step commands with increased time     Problem Solving: Requires verbal cues General Comments: Pt requiring increased cues throughout. Easily distracted by pain. Better able to follow commands today with encouragement/repetition. "i can't walk"        Exercises General Exercises - Lower Extremity Long Arc Quad: AROM, 20 reps, Both, Seated Hip ABduction/ADduction: AROM, Both, 20 reps, Seated Hip Flexion/Marching: AROM, Both, 20 reps, Seated    General Comments General comments (skin integrity, edema, etc.): In person interpreter present throughout.      Pertinent Vitals/Pain Pain Assessment Pain Assessment: Faces Faces Pain Scale: Hurts whole lot Pain Location: back, RLE Pain Descriptors / Indicators: Cramping, Discomfort, Grimacing, Guarding, Moaning Pain Intervention(s): Monitored during session, Limited activity within patient's tolerance, Repositioned    Home Living                          Prior Function            PT Goals (current goals can now be found in the care plan section) Progress towards PT goals: Progressing toward goals    Frequency    Min 4X/week      PT Plan Current plan remains appropriate    Co-evaluation              AM-PAC PT "6 Clicks" Mobility   Outcome Measure  Help needed turning from your back to your side while in a flat bed without using bedrails?: A Little Help needed moving from lying on your back to sitting on the side of a flat  bed without using bedrails?: A Lot Help needed moving to and from a bed to a chair (including a wheelchair)?: A Lot Help needed standing up from a chair using your arms (e.g., wheelchair or bedside chair)?: A Lot Help needed to walk in hospital room?: A Lot Help needed climbing 3-5 steps with a railing? : Total 6 Click Score: 12    End of Session Equipment Utilized During Treatment: Back brace;Gait belt Activity Tolerance: Patient limited by pain;Patient tolerated treatment well Patient left: in chair;with call bell/phone within reach;with chair alarm set Nurse Communication: Mobility status PT Visit Diagnosis: Other abnormalities of gait and mobility (R26.89);Muscle weakness (generalized) (M62.81)     Time: 4098-1191 PT Time Calculation (min) (ACUTE ONLY): 15 min  Charges:  $Gait Training: 8-22 mins                     Marisa Severin, PT, DPT Acute Rehabilitation Services Secure chat preferred Office [place] 06/16/2022, 1:52 PM

## 2022-06-16 NOTE — Progress Notes (Signed)
Interventional Radiology Brief Note  Patient planning for T12 KP today with Dr. Karenann Cai, however his WBC is again elevated and he is tachycardic.  Will hold on procedure today and follow possible procedure tomorrow pending infection work-up by hospital team.   NPO p MN.  Brynda Greathouse, MS RD PA-C

## 2022-06-16 NOTE — Progress Notes (Signed)
PROGRESS NOTE  Wayne Stokes  GEX:[id number] DOB: 12/28/1965 DOA: 05/26/2022 PCP: [name], NP   Brief Narrative:  Patient is a 56 year old male from [place] who peaks [place], with history of diabetes type 2, hypertension, lumbar disc degenerative disease with spinal stenosis/ claudication, T12 vertebral compression fracture, mild cognitive impairment who was admitted for the management of uncontrolled low back pain, ambulatory difficulty due to severe lumbar spine stenosis and T12 compression fracture.  Orthopedics, neurosurgery evaluated the patient and recommended TLSO brace, pain control and outpatient follow-up.  PT recommending acute inpatient rehab.  Difficult placement due to lack of insurance.LLOS. IR planning for T12 kyphoplasty  Assessment & Plan:  Principal Problem:   Pyogenic arthritis of right shoulder region [facility]) Active Problems:   Spinal stenosis of lumbar region with neurogenic claudication   T12 compression fracture (HCC)   AKI (acute kidney injury) (HCC)   Hyponatremia   Acute urinary retention   Type 2 diabetes mellitus without complication, without long-term current use of insulin (HCC)   Leukocytosis   Hypertension associated with diabetes (HCC)   Iron deficiency anemia   Hyperlipidemia associated with type 2 diabetes mellitus (HCC)   Mild cognitive impairment   Thrush, oral   Right shoulder pain   Discitis of lumbar region   Chronic low back pain with bilateral sciatica   Severe back pain/lumbar spinal stenosis/T12 compression fracture: Presented with severe low back pain, ambulatory dysfunction.  MRI did not show any cord injury.  Neurosurgery consulted, no surgical intervention recommended, recommended TLSO brace.  IR was also consulted for consideration of T12 kyphoplasty but this was canceled due to fever.  Requested for kyphoplasty again because he does not have any signs of sepsis.  IR planning for kyphoplasty again. PT/OT recommending acute  inpatient rehab on discharge.  Fever/leukocytosis: Unclear source. Now resolved. Developed leukocytosis, tachycardia, fever.  Chest x-ray did not show any pneumonia.  COVID-negative.  MRI of the spine on 9/18 was concerning for infection/discitis at L4 level.  Disc Aspiration was completed on 9/25 by IR.  He started on IV vancomycin, ceftriaxone.  Culture from spinal aspiration negative.  Patient had ongoing pain on the right shoulder, MRI was done which was suggestive of septic arthritis.  Orthopedics aspirated the shoulder on 9/27 with no fluid.  IR aspirated right shoulder on 9/29 without finding of any infection.  ID was also consulted, antibiotics discontinued.  Currently off antibiotics.  Has developed mild leukocytosis today.  Low suspicion for any infectious etiology.  Continue to monitor.remains afebrile  Right shoulder pain: Persistent.  Continue local anesthetics, oral pain medications.  Consideration of steroid injection in the shoulder as an outpatient.  Continue pain management.  Microcytic  iron deficiency anemia: Continue iron supplementation.  No evidence of active bleeding.  Unaware about any hematochezia or melena. We are also checking FOBT.  Transfuse with a unit of PRBC on 9/28.  Continue monitoring,Hb in the range of 8  Acute urinary retention/AKI: Felt to be secondary to back pain, mobility.  Started on Flomax.  Renal function is stabilized.  Voiding trial was given on 7/1 but again retained.  Foley was placed again. We recommend follow-up with urology as an outpatient.  Voiding trial again today  Constipation: Continue bowel regimen  Vitamin D deficiency: Continue supplementation  Hyponatremia :SIADH panel mostly negative.  Urine sodium less than 10.  Started on normal saline with improvement.Fluid stopped now  Diabetes type 2: Recent A1c of 7.4.  Monitor blood sugars  Hypertension: Blood  pressure well controlled  Hyperlipidemia: Continue Lipitor  Mild cognitive  impairment: Continue donepezil  Sinus tachycardia: Most likely secondary to pain.  Continue to monitor  Disposition: Waiting for acute inpatient rehab. Lack of insurance,likely  Long length of stay    Nutrition Problem: Increased nutrient needs Etiology: post-op healing    DVT prophylaxis:SCDs Start: 05/26/22 2254     Code Status: Full Code  Family Communication: Cousin at bedside on 10/7  Patient status:Inpatient  Patient is from :Home  Anticipated discharge to:AIR vs SNF  Estimated DC date:Not sure   Consultants: Orthopedics, IR, ID  Procedures: Joint aspiration  Antimicrobials:  Anti-infectives (From admission, onward)    Start     Dose/Rate Route Frequency Ordered Stop   06/03/22 0700  vancomycin (VANCOREADY) IVPB 750 mg/150 mL  Status:  Discontinued        750 mg 150 mL/hr over 60 Minutes Intravenous Every 12 hours 06/02/22 1848 06/06/22 1429   06/02/22 1945  vancomycin (VANCOCIN) IVPB 1000 mg/200 mL premix        1,000 mg 200 mL/hr over 60 Minutes Intravenous  Once 06/02/22 1847 06/02/22 2109   06/02/22 1930  cefTRIAXone (ROCEPHIN) 2 g in sodium chloride 0.9 % 100 mL IVPB  Status:  Discontinued        2 g 200 mL/hr over 30 Minutes Intravenous Every 24 hours 06/02/22 1835 06/06/22 1429   06/02/22 0000  ceFAZolin (ANCEF) IVPB 2g/100 mL premix  Status:  Discontinued        2 g 200 mL/hr over 30 Minutes Intravenous To Radiology 05/30/22 1504 06/02/22 1835       Subjective: Patient seen and examined at bedside today.  Hemodynamically stable.  Continues to complain of severe back pain.  Interpreter was at bedside as well.  Denies any shortness of breath, cough, abdominal pain  objective: Vitals:   06/15/22 2019 06/16/22 0410 06/16/22 0900 06/16/22 1100  BP: 132/76 94/72 102/79 122/81  Pulse: (!) 107 (!) 103 (!) 111 (!) 110  Resp: 17 19 18 18   Temp: 98.2 F (36.8 C) 98 F (36.7 C) 98.6 F (37 C) 98.5 F (36.9 C)  TempSrc:   Oral Oral  SpO2: 92% 90%  (!) 89% 93%  Weight:      Height:        Intake/Output Summary (Last 24 hours) at 06/16/2022 1242 Last data filed at 06/16/2022 0322 Gross per 24 hour  Intake 0 ml  Output 1300 ml  Net -1300 ml   Filed Weights   06/04/22 0315 06/06/22 0500 06/07/22 0500  Weight: 60 kg 62.8 kg 61.7 kg    Examination:  General exam: Lying in bed, in moderate distress due to back pain HEENT: PERRL Respiratory system:  no wheezes or crackles  Cardiovascular system: S1 & S2 heard, RRR.  Gastrointestinal system: Abdomen is nondistended, soft and nontender. Central nervous system: Alert and oriented Extremities: No edema, no clubbing ,no cyanosis Skin: No rashes, no ulcers,no icterus     Data Reviewed: I have personally reviewed following labs and imaging studies  CBC: Recent Labs  Lab 06/11/22 0805 06/13/22 0238 06/14/22 0216 06/15/22 0319 06/16/22 0801  WBC 9.8 8.9 8.1 8.6 11.3*  HGB 8.3* 7.3* 7.2* 7.5* 8.9*  HCT 24.9* 22.5* 21.9* 22.3* 26.8*  MCV 72.2* 71.9* 71.6* 71.2* 70.5*  PLT 635* 742* 735* 733* 864*   Basic Metabolic Panel: Recent Labs  Lab 06/12/22 0254 06/13/22 0238 06/14/22 0216 06/15/22 0319 06/16/22 0801  NA 127* 132* 130* 130*  133*  K 3.8 3.6 3.7 3.5 3.3*  CL 88* 96* 94* 94* 92*  CO2 31 27 28 29 28   GLUCOSE 119* 103* 94 80 83  BUN 5* <5* <5* 5* <5*  CREATININE 0.77 0.76 0.80 0.76 0.88  CALCIUM 9.9 9.7 9.7 9.5 10.3     No results found for this or any previous visit (from the past 240 hour(s)).    Radiology Studies: No results found.  Scheduled Meds:  atorvastatin  40 mg Oral Daily   Chlorhexidine Gluconate Cloth  6 each Topical Daily   cholecalciferol  1,000 Units Oral Daily   donepezil  5 mg Oral QHS   feeding supplement (GLUCERNA SHAKE)  237 mL Oral TID BM   ferrous sulfate  325 mg Oral Q breakfast   gabapentin  200 mg Oral TID   insulin aspart  0-9 Units Subcutaneous TID WC   lidocaine  1 patch Transdermal Q24H   lidocaine  1 patch Transdermal  Q24H   magnesium gluconate  500 mg Oral BID   metoprolol succinate  12.5 mg Oral Daily   morphine  15 mg Oral Q12H   polyethylene glycol  17 g Oral BID   senna-docusate  1 tablet Oral BID   tamsulosin  0.4 mg Oral Daily   Continuous Infusions:  potassium chloride 10 mEq (06/16/22 1013)      LOS: 21 days   Shelly Coss, MD [facility] P10/05/2022, 12:42 PM

## 2022-06-16 NOTE — Plan of Care (Signed)
  Problem: Education: Goal: Knowledge of General Education information will improve Description: Including pain rating scale, medication(s)/side effects and non-pharmacologic comfort measures Outcome: Progressing   Problem: Activity: Goal: Risk for activity intolerance will decrease Outcome: Progressing   Problem: Pain Managment: Goal: General experience of comfort will improve Outcome: Progressing   Problem: Skin Integrity: Goal: Risk for impaired skin integrity will decrease Outcome: Progressing   Problem: Tissue Perfusion: Goal: Adequacy of tissue perfusion will improve Outcome: Progressing

## 2022-06-17 ENCOUNTER — Inpatient Hospital Stay (HOSPITAL_COMMUNITY): Payer: Medicaid Other

## 2022-06-17 LAB — BASIC METABOLIC PANEL
Anion gap: 11 (ref 5–15)
BUN: 5 mg/dL — ABNORMAL LOW (ref 6–20)
CO2: 30 mmol/L (ref 22–32)
Calcium: 9.7 mg/dL (ref 8.9–10.3)
Chloride: 92 mmol/L — ABNORMAL LOW (ref 98–111)
Creatinine, Ser: 0.77 mg/dL (ref 0.61–1.24)
GFR, Estimated: >60 mL/min (ref >60)
Glucose, Bld: 105 mg/dL — ABNORMAL HIGH (ref 70–99)
Potassium: 3.1 mmol/L — ABNORMAL LOW (ref 3.5–5.1)
Sodium: 133 mmol/L — ABNORMAL LOW (ref 135–145)

## 2022-06-17 LAB — HEPATIC FUNCTION PANEL
ALT: 8 U/L (ref 0–44)
AST: 21 U/L (ref 15–41)
Albumin: 1.7 g/dL — ABNORMAL LOW (ref 3.5–5.0)
Alkaline Phosphatase: 94 U/L (ref 38–126)
Bilirubin, Direct: 0.2 mg/dL (ref 0.0–0.2)
Indirect Bilirubin: 0.6 mg/dL (ref 0.3–0.9)
Total Bilirubin: 0.8 mg/dL (ref 0.3–1.2)
Total Protein: 7.3 g/dL (ref 6.5–8.1)

## 2022-06-17 LAB — CBC
HCT: 23.7 % — ABNORMAL LOW (ref 39.0–52.0)
Hemoglobin: 7.9 g/dL — ABNORMAL LOW (ref 13.0–17.0)
MCH: 23.2 pg — ABNORMAL LOW (ref 26.0–34.0)
MCHC: 33.3 g/dL (ref 30.0–36.0)
MCV: 69.7 fL — ABNORMAL LOW (ref 80.0–100.0)
Platelets: 889 10*3/uL — ABNORMAL HIGH (ref 150–400)
RBC: 3.4 MIL/uL — ABNORMAL LOW (ref 4.22–5.81)
RDW: 18.9 % — ABNORMAL HIGH (ref 11.5–15.5)
WBC: 8.9 10*3/uL (ref 4.0–10.5)
nRBC: 0 % (ref 0.0–0.2)

## 2022-06-17 LAB — GLUCOSE, CAPILLARY
Glucose-Capillary: 102 mg/dL — ABNORMAL HIGH (ref 70–99)
Glucose-Capillary: 111 mg/dL — ABNORMAL HIGH (ref 70–99)
Glucose-Capillary: 157 mg/dL — ABNORMAL HIGH (ref 70–99)
Glucose-Capillary: 119 mg/dL — ABNORMAL HIGH (ref 70–99)

## 2022-06-17 LAB — MAGNESIUM: Magnesium: 1.6 mg/dL — ABNORMAL LOW (ref 1.7–2.4)

## 2022-06-17 MED ORDER — CEFAZOLIN SODIUM-DEXTROSE 2-4 GM/100ML-% IV SOLN: 2.0000 g | INTRAVENOUS | Status: AC

## 2022-06-17 MED ORDER — POTASSIUM CHLORIDE CRYS ER 20 MEQ PO TBCR
40.0000 meq | EXTENDED_RELEASE_TABLET | Freq: Two times a day (BID) | ORAL | Status: AC
  Administered 2022-06-17 (×2): 40 meq via ORAL
  Filled 2022-06-17 (×2): qty 2

## 2022-06-17 MED ORDER — IOHEXOL 350 MG/ML SOLN
75.0000 mL | Freq: Once | INTRAVENOUS | Status: AC | PRN
  Administered 2022-06-17: 75 mL via INTRAVENOUS

## 2022-06-17 MED ORDER — PANTOPRAZOLE SODIUM 40 MG PO TBEC
40.0000 mg | DELAYED_RELEASE_TABLET | Freq: Every day | ORAL | Status: DC
  Administered 2022-06-17 – 2022-07-19 (×28): 40 mg via ORAL
  Filled 2022-06-17 (×32): qty 1

## 2022-06-17 MED ORDER — MAGNESIUM SULFATE 2 GM/50ML IV SOLN
2.0000 g | Freq: Once | INTRAVENOUS | Status: AC
  Administered 2022-06-17: 2 g via INTRAVENOUS
  Filled 2022-06-17: qty 50

## 2022-06-17 NOTE — Progress Notes (Signed)
Pt bladder scan was 900, on call APP notified

## 2022-06-17 NOTE — Progress Notes (Signed)
PT Cancellation Note  Patient Details Name: Wayne Stokes MRN: [id number] DOB: Jan 22, 1966   Cancelled Treatment:    Reason Eval/Treat Not Completed: Patient at procedure or test/unavailable Pt off floor. Will follow.  [name] 06/17/2022, 10:39 AM Marisa Severin, PT, DPT Acute Rehabilitation Services Secure chat preferred Office [phone]

## 2022-06-17 NOTE — Progress Notes (Signed)
IR was requested for T 12 KP, but the procedure has been approved but it has been postponed due to concern for infection.   The procedure was tentatively scheduled for today, however, IR is not able to accommodate the patient today due to urgent add ons and staffing issue.   RN/MD notified.   The procedure is tentatively scheduled for tomorrow AM.  NPO at MN  Please call IR for questions and concerns.    Brazil Voytko Lemmie Evens Areesha Dehaven PA-C 06/17/2022 10:09 AM

## 2022-06-17 NOTE — TOC Progression Note (Signed)
Transition of Care [facility]) - Progression Note    Patient Details  Name: Wayne Stokes MRN: [id number] Date of Birth: 03-May-1966  Transition of Care [facility]) CM/SW Contact  Joanne Chars, LCSW Phone Number: 06/17/2022, 8:34 AM  Clinical Narrative:   No SNF bed offers. TOC will continue to follow.     Expected Discharge Plan: [place] Barriers to Discharge: Continued Medical Work up  Expected Discharge Plan and Services Expected Discharge Plan: [place] In-house Referral: Development worker, community Discharge Planning Services: CM Consult                                           Social Determinants of Health (SDOH) Interventions    Readmission Risk Interventions     No data to display

## 2022-06-17 NOTE — Progress Notes (Signed)
Nutrition Follow-up  DOCUMENTATION CODES:   Not applicable  INTERVENTION:  Continue current diet as ordered Encouraged adequate PO intake Continue Glucerna Shake po from once daily to TID, each supplement provides 220 kcal and 10 grams of protein Request updated weight  NUTRITION DIAGNOSIS:   Increased nutrient needs related to post-op healing as evidenced by other (comment) (Compression fracture).  Ongoing  GOAL:   Patient will meet greater than or equal to 90% of their needs  Progressing  MONITOR:   PO intake  REASON FOR ASSESSMENT:   Consult Assessment of nutrition requirement/status  ASSESSMENT:   56 y.o. male admits related to evaluation of worsening lower back pain. PMH includes: T2DM, HTN, anemia, AKI. 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  Listed food allergies: beef products, shellfish, egg/egg derived products, eggplant, pizza  Pt off unit at time of visit for CT scan. Spoke with RN. She reports that he did not have breakfast this morning d/t NPO status as pt is planned for kyphoplasty with IR which is now postponed until tomorrow. He was taken for CT d/t pt reported stomach pain. Reports that he is having urinary retention. Question whether abdominal pain is related to this in addition to constipation.   RN reports pt has been consuming Glucerna Shakes. Pt's meal completions continue to remain sporadic.   Meal completions: 10/4: 100% breakfast, 50% lunch, 80% dinner 10/5: 100% breakfast, 50% lunch, 50% dinner 10/8: 10% breakfast, 0% lunch  Medications: Vitamin D3, SSI 0-9 units TID, magnoate, protonix, miralax, klor-con, senna, IV Mg sulfate  Labs: sodium 133, potassium 3.1, BUN 5, Mg 1.6, CBG's 82-145 x24 hours  UOP: 39ml x24 hours I/O's: -14.3L since 9/26  Diet Order:   Diet Order             Diet NPO time specified Except for: Sips  with Meds  Diet effective midnight           Diet regular Room service appropriate? Yes; Fluid consistency: Thin  Diet effective now                   EDUCATION NEEDS:   Education needs have been addressed  Skin:  Skin Assessment: Reviewed RN Assessment  Last BM:  10/9 (type 2-small)  Height:   Ht Readings from Last 1 Encounters:  06/01/22 5' (1.524 m)    Weight:   Wt Readings from Last 1 Encounters:  06/07/22 61.7 kg    Ideal Body Weight:  48.2 kg  BMI:  Body mass index is 26.57 kg/m.  Estimated Nutritional Needs:   Kcal:  1600-1800  Protein:  80-95g  Fluid:  >/=1.6L  Clayborne Dana, RDN, LDN Clinical Nutrition

## 2022-06-17 NOTE — Progress Notes (Addendum)
PROGRESS NOTE  Arieon Corcoran  NOB:[id number] DOB: October 24, 1965 DOA: 05/26/2022 PCP: [name], NP   Brief Narrative:  Patient is a 56 year old male from [place] who peaks [place], with history of diabetes type 2, hypertension, lumbar disc degenerative disease with spinal stenosis/ claudication, T12 vertebral compression fracture, mild cognitive impairment who was admitted for the management of uncontrolled low back pain, ambulatory difficulty due to severe lumbar spine stenosis and T12 compression fracture.  Orthopedics, neurosurgery evaluated the patient and recommended TLSO brace, pain control and outpatient follow-up.  PT recommending acute inpatient rehab.  Difficult placement due to lack of insurance.LLOS. IR planning for T12 kyphoplasty for persistent back pain.  Assessment & Plan:  Principal Problem:   Pyogenic arthritis of right shoulder region [facility]) Active Problems:   Spinal stenosis of lumbar region with neurogenic claudication   T12 compression fracture (HCC)   AKI (acute kidney injury) (HCC)   Hyponatremia   Acute urinary retention   Type 2 diabetes mellitus without complication, without long-term current use of insulin (HCC)   Leukocytosis   Hypertension associated with diabetes (HCC)   Iron deficiency anemia   Hyperlipidemia associated with type 2 diabetes mellitus (HCC)   Mild cognitive impairment   Thrush, oral   Right shoulder pain   Discitis of lumbar region   Chronic low back pain with bilateral sciatica   Severe back pain/lumbar spinal stenosis/T12 compression fracture: Presented with severe low back pain, ambulatory dysfunction.  MRI did not show any cord injury.  Neurosurgery consulted, no surgical intervention recommended, recommended TLSO brace.  IR was also consulted for consideration of T12 kyphoplasty but this was canceled due to fever.  Requested for kyphoplasty again because he does not have any signs of sepsis.  IR planning for kyphoplasty  tomorrow. PT/OT recommending acute inpatient rehab on discharge. Has persistent back pain  Fever/leukocytosis: Unclear source. Now resolved.  Chest x-ray did not show any pneumonia.  COVID-negative.  MRI of the spine on 9/18 was concerning for infection/discitis at L4 level.  Disc Aspiration was completed on 9/25 by IR.  He started on IV vancomycin, ceftriaxone.  Culture from spinal aspiration negative.  Patient had ongoing pain on the right shoulder, MRI was done which was suggestive of septic arthritis.  Orthopedics aspirated the shoulder on 9/27 with no fluid.  IR aspirated right shoulder on 9/29 without finding of any infection.  ID was also consulted, antibiotics discontinued.  Currently off antibiotics.   Right shoulder pain: Persistent.  Continue local anesthetics, oral pain medications.  Consideration of steroid injection in the shoulder as an outpatient.  Continue pain management.  Abdominal discomfort/microcytic  iron deficiency anemia: Continue iron supplementation.  Iron studies showed low iron as per 9/29.  No evidence of active bleeding.  Unaware about any hematochezia or melena. We are also checking FOBT.  Transfused with a unit of PRBC on 9/28.  Continue monitoring,Hb in the range of 7-8.  Also complains of abdominal discomfort so doing CT abdomen/pelvis with contrast  Acute urinary retention/AKI: Felt to be secondary to back pain, mobility.  Started on Flomax.  Renal function is stabilized.  Failed voiding trial twice so Foley in place. we recommend follow-up with urology as an outpatient.    Constipation: Continue bowel regimen  Vitamin D deficiency: Continue supplementation  Hyponatremia :SIADH panel mostly negative.  Stable now  Diabetes type 2: Recent A1c of 7.4.  Monitor blood sugars  Hypertension: Blood pressure well controlled  Hyperlipidemia: Continue Lipitor  Mild cognitive impairment: Continue  donepezil  Sinus tachycardia: Most likely secondary to pain.  Continue  to monitor  Disposition: Waiting for acute inpatient rehab. Lack of insurance,likely  Long length of stay    Nutrition Problem: Increased nutrient needs Etiology: post-op healing    DVT prophylaxis:SCDs Start: 05/26/22 2254     Code Status: Full Code  Family Communication: Cousin at bedside on 10/7  Patient status:Inpatient  Patient is from :Home  Anticipated discharge to:AIR vs SNF  Estimated DC date:Not sure   Consultants: Orthopedics, IR, ID  Procedures: Joint aspiration  Antimicrobials:  Anti-infectives (From admission, onward)    Start     Dose/Rate Route Frequency Ordered Stop   06/03/22 0700  vancomycin (VANCOREADY) IVPB 750 mg/150 mL  Status:  Discontinued        750 mg 150 mL/hr over 60 Minutes Intravenous Every 12 hours 06/02/22 1848 06/06/22 1429   06/02/22 1945  vancomycin (VANCOCIN) IVPB 1000 mg/200 mL premix        1,000 mg 200 mL/hr over 60 Minutes Intravenous  Once 06/02/22 1847 06/02/22 2109   06/02/22 1930  cefTRIAXone (ROCEPHIN) 2 g in sodium chloride 0.9 % 100 mL IVPB  Status:  Discontinued        2 g 200 mL/hr over 30 Minutes Intravenous Every 24 hours 06/02/22 1835 06/06/22 1429   06/02/22 0000  ceFAZolin (ANCEF) IVPB 2g/100 mL premix  Status:  Discontinued        2 g 200 mL/hr over 30 Minutes Intravenous To Radiology 05/30/22 1504 06/02/22 1835       Subjective: Patient seen and examined at bedside today.  Hemodynamically stable.  Afebrile.  As usual, he complains of back pain.  Also complains of some abdominal discomfort.  Abdomen is soft and nondistended but had some tenderness.  Plan for kyphoplasty tomorrow  objective: Vitals:   06/17/22 0534 06/17/22 0545 06/17/22 0714 06/17/22 0714  BP: 114/80  110/76 110/76  Pulse: (!) 101  99 100  Resp: 14     Temp: 99.2 F (37.3 C)  99 F (37.2 C) 99 F (37.2 C)  TempSrc: Axillary  Oral Oral  SpO2: (!) 89% 92% 91% 90%  Weight:      Height:        Intake/Output Summary (Last 24  hours) at 06/17/2022 1148 Last data filed at 06/16/2022 1550 Gross per 24 hour  Intake --  Output 300 ml  Net -300 ml   Filed Weights   06/04/22 0315 06/06/22 0500 06/07/22 0500  Weight: 60 kg 62.8 kg 61.7 kg    Examination:   General exam: Overall comfortable,in  mild distress due to back pain HEENT: PERRL Respiratory system:  no wheezes or crackles  Cardiovascular system: S1 & S2 heard, RRR.  Gastrointestinal system: Abdomen is nondistended, soft .  But has mild tenderness Central nervous system: Alert and oriented Extremities: No edema, no clubbing ,no cyanosis Skin: No rashes, no ulcers,no icterus     Data Reviewed: I have personally reviewed following labs and imaging studies  CBC: Recent Labs  Lab 06/13/22 0238 06/14/22 0216 06/15/22 0319 06/16/22 0801 06/17/22 0117  WBC 8.9 8.1 8.6 11.3* 8.9  HGB 7.3* 7.2* 7.5* 8.9* 7.9*  HCT 22.5* 21.9* 22.3* 26.8* 23.7*  MCV 71.9* 71.6* 71.2* 70.5* 69.7*  PLT 742* 735* 733* 864* 889*   Basic Metabolic Panel: Recent Labs  Lab 06/13/22 0238 06/14/22 0216 06/15/22 0319 06/16/22 0801 06/17/22 0117  NA 132* 130* 130* 133* 133*  K 3.6 3.7 3.5 3.3*  3.1*  CL 96* 94* 94* 92* 92*  CO2 27 28 29 28 30   GLUCOSE 103* 94 80 83 105*  BUN <5* <5* 5* <5* 5*  CREATININE 0.76 0.80 0.76 0.88 0.77  CALCIUM 9.7 9.7 9.5 10.3 9.7  MG  --   --   --   --  1.6*     No results found for this or any previous visit (from the past 240 hour(s)).    Radiology Studies: No results found.  Scheduled Meds:  atorvastatin  40 mg Oral Daily   Chlorhexidine Gluconate Cloth  6 each Topical Daily   cholecalciferol  1,000 Units Oral Daily   donepezil  5 mg Oral QHS   feeding supplement (GLUCERNA SHAKE)  237 mL Oral TID BM   ferrous sulfate  325 mg Oral Q breakfast   gabapentin  200 mg Oral TID   insulin aspart  0-9 Units Subcutaneous TID WC   lidocaine  1 patch Transdermal Q24H   lidocaine  1 patch Transdermal Q24H   magnesium gluconate  500  mg Oral BID   metoprolol succinate  12.5 mg Oral Daily   morphine  15 mg Oral Q12H   pantoprazole  40 mg Oral Daily   polyethylene glycol  17 g Oral BID   potassium chloride  40 mEq Oral BID   senna-docusate  1 tablet Oral BID   tamsulosin  0.4 mg Oral Daily   Continuous Infusions:  magnesium sulfate bolus IVPB        LOS: 22 days   Shelly Coss, MD [facility] P10/06/2022, 11:48 AM

## 2022-06-17 NOTE — Progress Notes (Signed)
Unable to collect stool, pt did not have the need to have a BM. Pt was checked throughout night

## 2022-06-18 ENCOUNTER — Other Ambulatory Visit (HOSPITAL_COMMUNITY): Payer: Self-pay

## 2022-06-18 ENCOUNTER — Ambulatory Visit: Payer: No Typology Code available for payment source | Admitting: Nurse Practitioner

## 2022-06-18 ENCOUNTER — Other Ambulatory Visit (HOSPITAL_COMMUNITY): Payer: Self-pay | Admitting: Interventional Radiology

## 2022-06-18 ENCOUNTER — Inpatient Hospital Stay (HOSPITAL_COMMUNITY): Payer: Medicaid Other

## 2022-06-18 HISTORY — PX: IR KYPHO LUMBAR INC FX REDUCE BONE BX UNI/BIL CANNULATION INC/IMAGING: IMG5519

## 2022-06-18 LAB — COMPREHENSIVE METABOLIC PANEL
ALT: 8 U/L (ref 0–44)
AST: 20 U/L (ref 15–41)
Albumin: <1.5 g/dL — ABNORMAL LOW (ref 3.5–5.0)
Alkaline Phosphatase: 91 U/L (ref 38–126)
Anion gap: 8 (ref 5–15)
BUN: 5 mg/dL — ABNORMAL LOW (ref 6–20)
CO2: 30 mmol/L (ref 22–32)
Calcium: 9.5 mg/dL (ref 8.9–10.3)
Chloride: 93 mmol/L — ABNORMAL LOW (ref 98–111)
Creatinine, Ser: 0.84 mg/dL (ref 0.61–1.24)
GFR, Estimated: >60 mL/min (ref >60)
Glucose, Bld: 223 mg/dL — ABNORMAL HIGH (ref 70–99)
Potassium: 3.7 mmol/L (ref 3.5–5.1)
Sodium: 131 mmol/L — ABNORMAL LOW (ref 135–145)
Total Bilirubin: 0.4 mg/dL (ref 0.3–1.2)
Total Protein: 6.2 g/dL — ABNORMAL LOW (ref 6.5–8.1)

## 2022-06-18 LAB — CBC
HCT: 25.5 % — ABNORMAL LOW (ref 39.0–52.0)
Hemoglobin: 8.1 g/dL — ABNORMAL LOW (ref 13.0–17.0)
MCH: 23 pg — ABNORMAL LOW (ref 26.0–34.0)
MCHC: 31.8 g/dL (ref 30.0–36.0)
MCV: 72.4 fL — ABNORMAL LOW (ref 80.0–100.0)
Platelets: 722 10*3/uL — ABNORMAL HIGH (ref 150–400)
RBC: 3.52 MIL/uL — ABNORMAL LOW (ref 4.22–5.81)
RDW: 18.9 % — ABNORMAL HIGH (ref 11.5–15.5)
WBC: 8.5 10*3/uL (ref 4.0–10.5)
nRBC: 0 % (ref 0.0–0.2)

## 2022-06-18 LAB — PROTIME-INR
INR: 1.2 (ref 0.8–1.2)
Prothrombin Time: 15.1 seconds (ref 11.4–15.2)

## 2022-06-18 LAB — GLUCOSE, CAPILLARY
Glucose-Capillary: 116 mg/dL — ABNORMAL HIGH (ref 70–99)
Glucose-Capillary: 132 mg/dL — ABNORMAL HIGH (ref 70–99)
Glucose-Capillary: 140 mg/dL — ABNORMAL HIGH (ref 70–99)
Glucose-Capillary: 105 mg/dL — ABNORMAL HIGH (ref 70–99)
Glucose-Capillary: 120 mg/dL — ABNORMAL HIGH (ref 70–99)

## 2022-06-18 MED ORDER — LIDOCAINE HCL (PF) 1 % IJ SOLN
INTRAMUSCULAR | Status: AC
  Administered 2022-06-18: 30 mL
  Filled 2022-06-18: qty 30

## 2022-06-18 MED ORDER — MIDAZOLAM HCL 2 MG/2ML IJ SOLN
INTRAMUSCULAR | Status: AC | PRN
  Administered 2022-06-18 (×3): 1 mg via INTRAVENOUS

## 2022-06-18 MED ORDER — CEFAZOLIN SODIUM-DEXTROSE 2-4 GM/100ML-% IV SOLN
INTRAVENOUS | Status: AC
  Filled 2022-06-18: qty 100

## 2022-06-18 MED ORDER — FENTANYL CITRATE (PF) 100 MCG/2ML IJ SOLN
INTRAMUSCULAR | Status: AC | PRN
  Administered 2022-06-18 (×2): 50 ug via INTRAVENOUS
  Administered 2022-06-18 (×3): 25 ug via INTRAVENOUS

## 2022-06-18 MED ORDER — CEFAZOLIN SODIUM-DEXTROSE 2-4 GM/100ML-% IV SOLN
INTRAVENOUS | Status: AC | PRN
  Administered 2022-06-18: 2 g via INTRAVENOUS

## 2022-06-18 MED ORDER — FENTANYL CITRATE (PF) 100 MCG/2ML IJ SOLN
INTRAMUSCULAR | Status: AC
  Filled 2022-06-18: qty 4

## 2022-06-18 MED ORDER — MIDAZOLAM HCL 2 MG/2ML IJ SOLN
INTRAMUSCULAR | Status: AC
  Filled 2022-06-18: qty 2

## 2022-06-18 MED ORDER — BUPIVACAINE HCL (PF) 0.5 % IJ SOLN
INTRAMUSCULAR | Status: AC
  Administered 2022-06-18: 30 mL
  Filled 2022-06-18: qty 30

## 2022-06-18 NOTE — Procedures (Signed)
INTERVENTIONAL NEURORADIOLOGY BRIEF POSTPROCEDURE NOTE  FLUOROSCOPY GUIDED T12 CORE BIOPSY AND BALLOON KYPHOPLASTY  Attending: Dr. Pedro Earls  Diagnosis: Compression fracture, T12  Access site: Percutaneous  Anesthesia: Moderate sedation  Medication used: 3 mg Versed IV; 125 mcg Fentanyl IV.  Complications: None.  Estimated blood loss: None.  Specimen: T12 core biopsy.  Findings: Compression fracture of the T12 vertebral body. Bilateral transpedicular approach utilized for T12 core biopsy and balloon kyphoplasty.  The patient tolerated the procedure well without incident or complication and is in stable condition.

## 2022-06-18 NOTE — Progress Notes (Signed)
PROGRESS NOTE    Wayne Stokes  HDQ:[id number] DOB: July 01, 1966 DOA: 05/26/2022 PCP: [name], NP   Brief Narrative:  Patient is a 56 year old male from [place] who peaks [place], with history of diabetes type 2, hypertension, lumbar disc degenerative disease with spinal stenosis/ claudication, T12 vertebral compression fracture, mild cognitive impairment who was admitted for the management of uncontrolled low back pain, ambulatory difficulty due to severe lumbar spine stenosis and T12 compression fracture.  Orthopedics, neurosurgery evaluated the patient and recommended TLSO brace, pain control and outpatient follow-up.  PT recommending acute inpatient rehab.  Difficult placement due to lack of insurance.LLOS. IR planning for T12 kyphoplasty for persistent back pain.  Assessment & Plan:   Principal Problem:   Pyogenic arthritis of right shoulder region [facility]) Active Problems:   Spinal stenosis of lumbar region with neurogenic claudication   T12 compression fracture (HCC)   AKI (acute kidney injury) (HCC)   Hyponatremia   Acute urinary retention   Type 2 diabetes mellitus without complication, without long-term current use of insulin (HCC)   Leukocytosis   Hypertension associated with diabetes (HCC)   Iron deficiency anemia   Hyperlipidemia associated with type 2 diabetes mellitus (HCC)   Mild cognitive impairment   Thrush, oral   Right shoulder pain   Discitis of lumbar region   Chronic low back pain with bilateral sciatica  Severe back pain/lumbar spinal stenosis/T12 compression fracture: Presented with severe low back pain, ambulatory dysfunction.  MRI did not show any cord injury.  Neurosurgery consulted, no surgical intervention recommended, recommended TLSO brace.  IR was also consulted for consideration of T12 kyphoplasty but this was canceled due to fever.  Requested for kyphoplasty again because he does not have any signs of sepsis.  Patient eventually underwent  kyphoplasty on 06/18/2022.   Fever/leukocytosis: Unclear source. Now resolved.  Chest x-ray did not show any pneumonia.  COVID-negative.  MRI of the spine on 9/18 was concerning for infection/discitis at L4 level.  Disc Aspiration was completed on 9/25 by IR.  He started on IV vancomycin, ceftriaxone.  Culture from spinal aspiration negative.  Patient had ongoing pain on the right shoulder, MRI was done which was suggestive of septic arthritis.  Orthopedics aspirated the shoulder on 9/27 with no fluid.  IR aspirated right shoulder on 9/29 without finding of any infection.  ID was also consulted, antibiotics discontinued.  Currently off antibiotics.    Right shoulder pain: Persistent.  Continue local anesthetics, oral pain medications.  Consideration of steroid injection in the shoulder as an outpatient.  Continue pain management.   Abdominal discomfort/microcytic  iron deficiency anemia: Continue iron supplementation.  Iron studies showed low iron as per 9/29.  No evidence of active bleeding.  Unaware about any hematochezia or melena. Transfused with a unit of PRBC on 9/28.  Continue monitoring,Hb in the range of 7-8.  Also he was complaining of abdominal pain so abdominal CT was obtained on 06/17/2022 which did not show any acute findings, distal CBD was slightly dilated but no stones or obstruction or lesion.     Acute urinary retention/AKI: Felt to be secondary to back pain, mobility.  Started on Flomax.  Renal function is stabilized.  Failed voiding trial twice so Foley in place. we recommend follow-up with urology as an outpatient.     Constipation: Continue bowel regimen   Vitamin D deficiency: Continue supplementation   Hyponatremia :SIADH panel mostly negative.  Stable between 130-133 now  Hypomagnesemia: It appears that patient's magnesium was  1.6 yesterday.  I will check it again today.   Diabetes type 2: Recent A1c of 7.4.  Blood sugar controlled.   Hypertension: Blood pressure well  controlled   Hyperlipidemia: Continue Lipitor   Mild cognitive impairment: Continue donepezil   Sinus tachycardia: Most likely secondary to pain.  Continue to monitor   Disposition: Waiting for SNF.  Patient carries no insurance.  Will be difficult to place per TOC.  DVT prophylaxis: SCDs Start: 05/26/22 2254   Code Status: Full Code  Family Communication:  None present at bedside.  Plan of care discussed with patient in length and he/she verbalized understanding and agreed with it.  Status is: Inpatient Remains inpatient appropriate because: Medically stable, needs SNF placement.   Estimated body mass index is 26.57 kg/m as calculated from the following:   Height as of this encounter: 5' (1.524 m).   Weight as of this encounter: 61.7 kg.    Nutritional Assessment: Body mass index is 26.57 kg/m.Marland Kitchen Seen by dietician.  I agree with the assessment and plan as outlined below: Nutrition Status: Nutrition Problem: Increased nutrient needs Etiology: post-op healing Signs/Symptoms: other (comment) (Compression fracture) Interventions: Glucerna shake  . Skin Assessment: I have examined the patient's skin and I agree with the wound assessment as performed by the wound care RN as outlined below:    Consultants:  IR Infectious disease Procedures:  As above  Antimicrobials:  Anti-infectives (From admission, onward)    Start     Dose/Rate Route Frequency Ordered Stop   06/18/22 0839  ceFAZolin (ANCEF) IVPB 2g/100 mL premix        over 30 Minutes Intravenous Continuous PRN 06/18/22 0852 06/18/22 0839   06/18/22 0500  ceFAZolin (ANCEF) IVPB 2g/100 mL premix        2 g 200 mL/hr over 30 Minutes Intravenous To Short Stay 06/17/22 1338 06/19/22 0500   06/03/22 0700  vancomycin (VANCOREADY) IVPB 750 mg/150 mL  Status:  Discontinued        750 mg 150 mL/hr over 60 Minutes Intravenous Every 12 hours 06/02/22 1848 06/06/22 1429   06/02/22 1945  vancomycin (VANCOCIN) IVPB 1000 mg/200  mL premix        1,000 mg 200 mL/hr over 60 Minutes Intravenous  Once 06/02/22 1847 06/02/22 2109   06/02/22 1930  cefTRIAXone (ROCEPHIN) 2 g in sodium chloride 0.9 % 100 mL IVPB  Status:  Discontinued        2 g 200 mL/hr over 30 Minutes Intravenous Every 24 hours 06/02/22 1835 06/06/22 1429   06/02/22 0000  ceFAZolin (ANCEF) IVPB 2g/100 mL premix  Status:  Discontinued        2 g 200 mL/hr over 30 Minutes Intravenous To Radiology 05/30/22 1504 06/02/22 1835         Subjective: Patient seen and examined after kyphoplasty.  He was comfortable with no complaints.  Objective: Vitals:   06/18/22 0930 06/18/22 0936 06/18/22 0942 06/18/22 1023  BP: (!) 122/99 (!) 136/104 109/87 126/89  Pulse: 99 (!) 101  100  Resp: (!) 9 12  16   Temp:    98.8 F (37.1 C)  TempSrc:    Oral  SpO2: 100% 100%  93%  Weight:      Height:        Intake/Output Summary (Last 24 hours) at 06/18/2022 1102 Last data filed at 06/18/2022 0747 Gross per 24 hour  Intake --  Output 1350 ml  Net -1350 ml   08/18/2022   06/04/22  0315 06/06/22 0500 06/07/22 0500  Weight: 60 kg 62.8 kg 61.7 kg    Examination:  General exam: Appears calm and comfortable  Respiratory system: Clear to auscultation. Respiratory effort normal. Cardiovascular system: S1 & S2 heard, RRR. No JVD, murmurs, rubs, gallops or clicks. No pedal edema. Gastrointestinal system: Abdomen is nondistended, soft and nontender. No organomegaly or masses felt. Normal bowel sounds heard. Central nervous system: Alert and oriented. No focal neurological deficits. Extremities: Symmetric 5 x 5 power. Skin: No rashes, lesions or ulcers  Data Reviewed: I have personally reviewed following labs and imaging studies  CBC: Recent Labs  Lab 06/14/22 0216 06/15/22 0319 06/16/22 0801 06/17/22 0117 06/18/22 0012  WBC 8.1 8.6 11.3* 8.9 8.5  HGB 7.2* 7.5* 8.9* 7.9* 8.1*  HCT 21.9* 22.3* 26.8* 23.7* 25.5*  MCV 71.6* 71.2* 70.5* 69.7* 72.4*  PLT  735* 733* 864* 889* 722*   Basic Metabolic Panel: Recent Labs  Lab 06/14/22 0216 06/15/22 0319 06/16/22 0801 06/17/22 0117 06/18/22 0012  NA 130* 130* 133* 133* 131*  K 3.7 3.5 3.3* 3.1* 3.7  CL 94* 94* 92* 92* 93*  CO2 GLUCOSE 94 80 83 105* 223*  BUN <5* 5* <5* 5* 5*  CREATININE 0.80 0.76 0.88 0.77 0.84  CALCIUM 9.7 9.5 10.3 9.7 9.5  MG  --   --   --  1.6*  --    GFR: Estimated Creatinine Clearance: 75.1 mL/min (by C-G formula based on SCr of 0.84 mg/dL). Liver Function Tests: Recent Labs  Lab 06/17/22 1504 06/18/22 0012  AST 21 20  ALT 8 8  ALKPHOS 94 91  BILITOT 0.8 0.4  PROT 7.3 6.2*  ALBUMIN 1.7* <1.5*   No results for input(s): "LIPASE", "AMYLASE" in the last 168 hours. No results for input(s): "AMMONIA" in the last 168 hours. Coagulation Profile: Recent Labs  Lab 06/18/22 0012  INR 1.2   Cardiac Enzymes: No results for input(s): "CKTOTAL", "CKMB", "CKMBINDEX", "TROPONINI" in the last 168 hours. BNP (last 3 results) No results for input(s): "PROBNP" in the last 8760 hours. HbA1C: No results for input(s): "HGBA1C" in the last 72 hours. CBG: Recent Labs  Lab 06/17/22 0715 06/17/22 1135 06/17/22 1636 06/17/22 1926 06/18/22 0736  GLUCAP 102* 111* 157* 119* 116*   Lipid Profile: No results for input(s): "CHOL", "HDL", "LDLCALC", "TRIG", "CHOLHDL", "LDLDIRECT" in the last 72 hours. Thyroid Function Tests: No results for input(s): "TSH", "T4TOTAL", "FREET4", "T3FREE", "THYROIDAB" in the last 72 hours. Anemia Panel: No results for input(s): "VITAMINB12", "FOLATE", "FERRITIN", "TIBC", "IRON", "RETICCTPCT" in the last 72 hours. Sepsis Labs: No results for input(s): "PROCALCITON", "LATICACIDVEN" in the last 168 hours.  No results found for this or any previous visit (from the past 240 hour(s)).   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 He presents today for a core biopsy balloon kyphoplasty. EXAM: FLUOROSCOPY GUIDED T12 CORE BONE BIOPSY AND BALLOON KYPHOPLASTY COMPARISON:  CT of the abdomen June 17, 2022; MRI of the lumbar spine May 26, 2022. MEDICATIONS: As antibiotic prophylaxis, 2 g of Ancef was ordered pre-procedure and administered intravenously within 1 hour of incision. All current medications are in the EMR and have been reviewed as part of this encounter. ANESTHESIA/SEDATION: Moderate (conscious) sedation was employed during this procedure. A total of Versed 3 mg and Fentanyl 125 mcg were administered intravenously  for moderate conscious sedation monitored under my direct supervision. Total intraservice time of sedation was 46 minute. The patient's vital signs were monitored throughout the procedure and recorded in the patient's medical record by the nurse. FLUOROSCOPY: Radiation Exposure Index (as provided by the fluoroscopic device): PSD 1,665 mGy Kerma COMPLICATIONS: None immediate. PROCEDURE: Following a full explanation of the procedure along with the potential associated complications, an informed witnessed consent was obtained. The patient was placed in prone position on the angiography table. The thoracic spine region was prepped and draped in a sterile fashion. Under fluoroscopy, the T12 vertebral body was delineated and the skin area was marked. The skin was infiltrated with a 1% Lidocaine approximately 3 cm lateral to the spinous process projection on the right. Using a 22-gauge spinal needle, the soft issue and the peripedicular space and periosteum were infiltrated with Bupivacaine 0.5%. A skin incision was made at the access site. Subsequently, an 11-gauge Kyphon trocar was inserted under fluoroscopic guidance until contact with the pedicle was obtained. The trocar was inserted under light hammer tapping into the pedicle until the  posterior boundaries of the vertebral body was reached. The diamond mandrill was removed and one core biopsy wasobtained. The skin was infiltrated with a 1% Lidocaine approximately 3 cm lateral to the spinous process projection on the left. Using a 22-gauge spinal needle, the soft issue and the peripedicular space and periosteum were infiltrated with Bupivacaine 0.5%. A skin incision was made at the access site. Subsequently, an 11-gauge Kyphon trocar was inserted under fluoroscopic guidance until contact with the pedicle was obtained. The trocar was inserted under light hammer tapping into the pedicle until the posterior boundaries of the vertebral body was reached. The diamond mandrill was removed. A bone drill was coaxially advanced within the anterior third of the vertebral body and then exchanged for inflatable Kyphon balloons. These were centered within the mid-aspect of the vertebral body. The balloons were inflated to create a void to serve as a repository for the bone cement. Both balloons were deflated and through both cannulas, under continuous fluoroscopy guidance in the AP and lateral views, the vertebral body was filled with previously mixed polymethyl-methacrylate (PMMA) added to barium for opacification. Both cannulas were later removed. The access sites were cleaned and covered with a sterile bandage. IMPRESSION: 1. Successful fluoroscopy-guided bilateral transpedicular approach for T12 vertebral body core bone biopsy and kyphoplasty for treatment of osteoporotic fragility fracture. Bone samples obtained were sent for pathology analysis. 2. If the patient has known osteoporosis, recommend treatment as clinically indicated. If the patient's bone density status is unknown, DEXA scan is recommended. Electronically Signed   By: Baldemar Lenis M.D.   On: 06/18/2022 10:05   CT ABDOMEN PELVIS W CONTRAST  Result Date: 06/17/2022 CLINICAL DATA:  Diffuse abdominal and back pain. EXAM: CT  ABDOMEN AND PELVIS WITH CONTRAST TECHNIQUE: Multidetector CT imaging of the abdomen and pelvis was performed using the standard protocol following bolus administration of intravenous contrast. RADIATION DOSE REDUCTION: This exam was performed according to the departmental dose-optimization program which includes automated exposure control, adjustment of the mA and/or kV according to patient size and/or use of iterative reconstruction technique. CONTRAST:  77mL OMNIPAQUE IOHEXOL 350 MG/ML SOLN COMPARISON:  05/07/2022 FINDINGS: Lower chest: There is some peripheral airway impaction in both dependent lower lobes, likely infectious/inflammatory. Hepatobiliary: No suspicious focal abnormality within the liver parenchyma. There is no evidence for gallstones, gallbladder wall thickening, or pericholecystic fluid. Distal common bile duct measures  up to 9 mm diameter, increased in the interval. No obstructing stone or mass lesion evident. Pancreas: No focal mass lesion. No dilatation of the main duct. No intraparenchymal cyst. No peripancreatic edema. Spleen: No splenomegaly. No focal mass lesion. Adrenals/Urinary Tract: No adrenal nodule or mass. Left kidney unremarkable. Malrotated right kidney, otherwise unremarkable. No evidence for hydroureter. Small gas bubble seen in the bladder lumen, presumably from recent instrumentation. Stomach/Bowel: Stomach is unremarkable. No gastric wall thickening. No evidence of outlet obstruction. Duodenum is normally positioned as is the ligament of Treitz. No small bowel wall thickening. No small bowel dilatation. The terminal ileum is normal. The appendix is normal. No gross colonic mass. No colonic wall thickening. Vascular/Lymphatic: There is mild atherosclerotic calcification of the abdominal aorta without aneurysm. There is no gastrohepatic or hepatoduodenal ligament lymphadenopathy. No retroperitoneal or mesenteric lymphadenopathy. No pelvic sidewall lymphadenopathy. Reproductive:  The prostate gland and seminal vesicles are unremarkable. Other: No intraperitoneal free fluid. 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 This disc was aspirated on 06/02/2022. stable T12 compression fracture. IMPRESSION: 1. No acute findings in the abdomen or pelvis. Inflammatory changes seen in the region of the duodenal bulb previously are not evident today. 2. Distal common bile duct measures up to 9 mm diameter, increased in the interval. No obstructing stone or mass lesion evident. Correlation with liver function test may prove helpful. Could be used to further evaluate as clinically warranted. 3. 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 This disc was aspirated on 06/02/2022. 4. Small gas bubble seen in the bladder lumen, presumably from recent instrumentation. In the absence of recent instrumentation, bladder infection would be a consideration. 5. Aortic Atherosclerosis (ICD10-I70.0). MRI/MRCP Electronically Signed   By: Misty Stanley M.D.   On: 06/17/2022 11:55    Scheduled Meds:  atorvastatin  40 mg Oral Daily   Chlorhexidine Gluconate Cloth  6 each Topical Daily   cholecalciferol  1,000 Units Oral Daily   donepezil  5 mg Oral QHS   feeding supplement (GLUCERNA SHAKE)  237 mL Oral TID BM   ferrous sulfate  325 mg Oral Q breakfast   gabapentin  200 mg Oral TID   insulin aspart  0-9 Units Subcutaneous TID WC   lidocaine  1 patch Transdermal Q24H   lidocaine  1 patch Transdermal Q24H   magnesium gluconate  500 mg Oral BID   metoprolol succinate  12.5 mg Oral Daily   midazolam       morphine  15 mg Oral Q12H   pantoprazole  40 mg Oral Daily   polyethylene glycol  17 g Oral BID   senna-docusate  1 tablet Oral BID   tamsulosin  0.4 mg Oral Daily   Continuous Infusions:   ceFAZolin (ANCEF) IV       LOS: 23 days   Darliss Cheney, MD [facility]  06/18/2022, 11:02 AM   *Please note that this is a verbal  dictation therefore any spelling or grammatical errors are due to the "[place] [employer]" system interpretation.  Please page via [place] and do not message via secure chat for urgent patient care matters. Secure chat can be used for non urgent patient care matters.  How to contact the [facility] Attending or Consulting provider [place] or covering provider during after hours [place], for this patient?  Check the care team in [facility] and look for a) attending/consulting [facility] provider listed and b) the [facility] team listed. Page or secure chat  7A-7P. Log into [email or web address] and use [place] universal password to access. If you do not have the password, please contact the hospital operator. Locate the [facility] provider you are looking for under [facility] and page to a number that you can be directly reached. If you still have difficulty reaching the provider, please page the [facility] (Director on Call) for the Hospitalists listed on amion for assistance.

## 2022-06-18 NOTE — Progress Notes (Signed)
PT Cancellation Note  Patient Details Name: Christiaan Strebeck MRN: [id number] DOB: 07-10-1966   Cancelled Treatment:    Reason Eval/Treat Not Completed: Patient at procedure or test/unavailable Off unit at procedure. Will follow up as schedule allows.   Vaneta Hammontree A. Gilford Rile PT, DPT Acute Rehabilitation Services Office [phone]    Linna Hoff 06/18/2022, 9:46 AM

## 2022-06-19 LAB — SURGICAL PATHOLOGY

## 2022-06-19 LAB — GLUCOSE, CAPILLARY
Glucose-Capillary: 102 mg/dL — ABNORMAL HIGH (ref 70–99)
Glucose-Capillary: 117 mg/dL — ABNORMAL HIGH (ref 70–99)
Glucose-Capillary: 195 mg/dL — ABNORMAL HIGH (ref 70–99)

## 2022-06-19 LAB — MAGNESIUM: Magnesium: 1.5 mg/dL — ABNORMAL LOW (ref 1.7–2.4)

## 2022-06-19 NOTE — Progress Notes (Signed)
PROGRESS NOTE    Wayne Stokes  [id number] DOB: March 22, 1966 DOA: 05/26/2022 PCP: [name], NP   Brief Narrative:  Patient is a 56 year old male from [place] who [place], with history of diabetes type 2, hypertension, lumbar disc degenerative disease with spinal stenosis/ claudication, T12 vertebral compression fracture, mild cognitive impairment who was admitted for the management of uncontrolled low back pain, ambulatory difficulty due to severe lumbar spine stenosis and T12 compression fracture.  Orthopedics, neurosurgery evaluated the patient and recommended TLSO brace, pain control and outpatient follow-up.  PT recommending acute inpatient rehab.  Difficult placement due to lack of insurance.LLOS. IR planning for T12 kyphoplasty for persistent back pain.  Assessment & Plan:   Principal Problem:   Pyogenic arthritis of right shoulder region [facility]) Active Problems:   Spinal stenosis of lumbar region with neurogenic claudication   T12 compression fracture (HCC)   AKI (acute kidney injury) ([place])   Hyponatremia   Acute urinary retention   Type 2 diabetes mellitus without complication, without long-term current use of insulin (HCC)   Leukocytosis   Hypertension associated with diabetes ([place])   Iron deficiency anemia   Hyperlipidemia associated with type 2 diabetes mellitus (HCC)   Mild cognitive impairment   Thrush, oral   Right shoulder pain   Discitis of lumbar region   Chronic low back pain with bilateral sciatica  Severe back pain/lumbar spinal stenosis/T12 compression fracture: Presented with severe low back pain, ambulatory dysfunction.  MRI did not show any cord injury.  Neurosurgery consulted, no surgical intervention recommended, recommended TLSO brace.  IR was also consulted for consideration of T12 kyphoplasty but this was canceled due to fever.  Requested for kyphoplasty again because he does not have any signs of sepsis.  Patient eventually underwent  kyphoplasty on 06/18/2022.  Pain is controlled.  Needs to be evaluated by PT OT.  I suspect he is going to require SNF but that is going to be challenging as he does not have insurance.   Fever/leukocytosis: Unclear source. Now resolved.  Chest x-ray did not show any pneumonia.  COVID-negative.  MRI of the spine on 9/18 was concerning for infection/discitis at L4 level.  Disc Aspiration was completed on 9/25 by IR.  He started on IV vancomycin, ceftriaxone.  Culture from spinal aspiration negative.  Patient had ongoing pain on the right shoulder, MRI was done which was suggestive of septic arthritis.  Orthopedics aspirated the shoulder on 9/27 with no fluid.  IR aspirated right shoulder on 9/29 without finding of any infection.  ID was also consulted, antibiotics discontinued.  Currently off antibiotics.    Right shoulder pain: Persistent.  Continue local anesthetics, oral pain medications.  Consideration of steroid injection in the shoulder as an outpatient.  Continue pain management.   Abdominal discomfort/microcytic  iron deficiency anemia: Continue iron supplementation.  Iron studies showed low iron as per 9/29.  No evidence of active bleeding.  Unaware about any hematochezia or melena. Transfused with a unit of PRBC on 9/28.  Continue monitoring,Hb in the range of 7-8.  Also he was complaining of abdominal pain so abdominal CT was obtained on 06/17/2022 which did not show any acute findings, distal CBD was slightly dilated but no stones or obstruction or lesion.     Acute urinary retention/AKI: Felt to be secondary to back pain, mobility.  Started on Flomax.  Renal function is stabilized.  Failed voiding trial twice so Foley in place. we recommend follow-up with urology as an outpatient.  Constipation: Continue bowel regimen   Vitamin D deficiency: Continue supplementation   Hyponatremia :SIADH panel mostly negative.  Stable between 130-133 now  Hypomagnesemia: It appears that patient's  magnesium was 1.6 on 06/17/2022,  I will check it again today.   Diabetes type 2: Recent A1c of 7.4.  Blood sugar controlled.   Hypertension: Blood pressure well controlled   Hyperlipidemia: Continue Lipitor   Mild cognitive impairment: Continue donepezil   Sinus tachycardia: Most likely secondary to pain.  Continue to monitor   Disposition: Waiting for SNF.  Patient carries no insurance.  Will be difficult to place per TOC.  DVT prophylaxis: SCDs Start: 05/26/22 2254   Code Status: Full Code  Family Communication:  None present at bedside.  Plan of care discussed with patient in length and he/she verbalized understanding and agreed with it.  Status is: Inpatient Remains inpatient appropriate because: Medically stable, needs SNF placement.   Estimated body mass index is 26.57 kg/m as calculated from the following:   Height as of this encounter: 5' (1.524 m).   Weight as of this encounter: 61.7 kg.    Nutritional Assessment: Body mass index is 26.57 kg/m.Marland Kitchen Seen by dietician.  I agree with the assessment and plan as outlined below: Nutrition Status: Nutrition Problem: Increased nutrient needs Etiology: post-op healing Signs/Symptoms: other (comment) (Compression fracture) Interventions: Glucerna shake  . Skin Assessment: I have examined the patient's skin and I agree with the wound assessment as performed by the wound care RN as outlined below:    Consultants:  IR Infectious disease Procedures:  As above  Antimicrobials:  Anti-infectives (From admission, onward)    Start     Dose/Rate Route Frequency Ordered Stop   06/18/22 0839  ceFAZolin (ANCEF) IVPB 2g/100 mL premix        over 30 Minutes Intravenous Continuous PRN 06/18/22 0852 06/18/22 0839   06/18/22 0500  ceFAZolin (ANCEF) IVPB 2g/100 mL premix        2 g 200 mL/hr over 30 Minutes Intravenous To Short Stay 06/17/22 1338 06/19/22 0500   06/03/22 0700  vancomycin (VANCOREADY) IVPB 750 mg/150 mL  Status:   Discontinued        750 mg 150 mL/hr over 60 Minutes Intravenous Every 12 hours 06/02/22 1848 06/06/22 1429   06/02/22 1945  vancomycin (VANCOCIN) IVPB 1000 mg/200 mL premix        1,000 mg 200 mL/hr over 60 Minutes Intravenous  Once 06/02/22 1847 06/02/22 2109   06/02/22 1930  cefTRIAXone (ROCEPHIN) 2 g in sodium chloride 0.9 % 100 mL IVPB  Status:  Discontinued        2 g 200 mL/hr over 30 Minutes Intravenous Every 24 hours 06/02/22 1835 06/06/22 1429   06/02/22 0000  ceFAZolin (ANCEF) IVPB 2g/100 mL premix  Status:  Discontinued        2 g 200 mL/hr over 30 Minutes Intravenous To Radiology 05/30/22 1504 06/02/22 1835         Subjective: Seen and examined.  No complaints.  Pain is improving.  Objective: Vitals:   06/18/22 1023 06/18/22 2031 06/19/22 0526 06/19/22 0750  BP: 126/89 110/77 107/64 110/67  Pulse: 100 96 99 99  Resp: 16 17 15 12   Temp: 98.8 F (37.1 C) 98.4 F (36.9 C) 97.8 F (36.6 C) 98.4 F (36.9 C)  TempSrc: Oral   Oral  SpO2: 93% (!) 89% 90% 95%  Weight:      Height:        Intake/Output  Summary (Last 24 hours) at 06/19/2022 1137 Last data filed at 06/19/2022 0900 Gross per 24 hour  Intake 120 ml  Output 401 ml  Net -281 ml    Filed Weights   06/04/22 0315 06/06/22 0500 06/07/22 0500  Weight: 60 kg 62.8 kg 61.7 kg    Examination:  General exam: Appears calm and comfortable  Respiratory system: Clear to auscultation. Respiratory effort normal. Cardiovascular system: S1 & S2 heard, RRR. No JVD, murmurs, rubs, gallops or clicks. No pedal edema. Gastrointestinal system: Abdomen is nondistended, soft and nontender. No organomegaly or masses felt. Normal bowel sounds heard. Central nervous system: Alert and oriented. No focal neurological deficits. Extremities: Symmetric 5 x 5 power.   Data Reviewed: I have personally reviewed following labs and imaging studies  CBC: Recent Labs  Lab 06/14/22 0216 06/15/22 0319 06/16/22 0801  06/17/22 0117 06/18/22 0012  WBC 8.1 8.6 11.3* 8.9 8.5  HGB 7.2* 7.5* 8.9* 7.9* 8.1*  HCT 21.9* 22.3* 26.8* 23.7* 25.5*  MCV 71.6* 71.2* 70.5* 69.7* 72.4*  PLT 735* 733* 864* 889* 722*    Basic Metabolic Panel: Recent Labs  Lab 06/14/22 0216 06/15/22 0319 06/16/22 0801 06/17/22 0117 06/18/22 0012  NA 130* 130* 133* 133* 131*  K 3.7 3.5 3.3* 3.1* 3.7  CL 94* 94* 92* 92* 93*  CO2 28 29 28 30 30   GLUCOSE 94 80 83 105* 223*  BUN <5* 5* <5* 5* 5*  CREATININE 0.80 0.76 0.88 0.77 0.84  CALCIUM 9.7 9.5 10.3 9.7 9.5  MG  --   --   --  1.6*  --     GFR: Estimated Creatinine Clearance: 75.1 mL/min (by C-G formula based on SCr of 0.84 mg/dL). Liver Function Tests: Recent Labs  Lab 06/17/22 1504 06/18/22 0012  AST 21 20  ALT 8 8  ALKPHOS 94 91  BILITOT 0.8 0.4  PROT 7.3 6.2*  ALBUMIN 1.7* <1.5*    No results for input(s): "LIPASE", "AMYLASE" in the last 168 hours. No results for input(s): "AMMONIA" in the last 168 hours. Coagulation Profile: Recent Labs  Lab 06/18/22 0012  INR 1.2    Cardiac Enzymes: No results for input(s): "CKTOTAL", "CKMB", "CKMBINDEX", "TROPONINI" in the last 168 hours. BNP (last 3 results) No results for input(s): "PROBNP" in the last 8760 hours. HbA1C: No results for input(s): "HGBA1C" in the last 72 hours. CBG: Recent Labs  Lab 06/18/22 1144 06/18/22 1226 06/18/22 1617 06/18/22 2119 06/19/22 0741  GLUCAP 132* 140* 105* 120* 102*    Lipid Profile: No results for input(s): "CHOL", "HDL", "LDLCALC", "TRIG", "CHOLHDL", "LDLDIRECT" in the last 72 hours. Thyroid Function Tests: No results for input(s): "TSH", "T4TOTAL", "FREET4", "T3FREE", "THYROIDAB" in the last 72 hours. Anemia Panel: No results for input(s): "VITAMINB12", "FOLATE", "FERRITIN", "TIBC", "IRON", "RETICCTPCT" in the last 72 hours. Sepsis Labs: No results for input(s): "PROCALCITON", "LATICACIDVEN" in the last 168 hours.  No results found for this or any previous visit  (from the past 240 hour(s)).   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 He presents today for a core biopsy balloon kyphoplasty. EXAM: FLUOROSCOPY GUIDED T12 CORE BONE BIOPSY AND BALLOON KYPHOPLASTY COMPARISON:  CT of the abdomen June 17, 2022; MRI of the lumbar spine May 26, 2022. MEDICATIONS: As antibiotic prophylaxis, 2 g of Ancef was ordered pre-procedure and administered intravenously within 1 hour of incision. All  current medications are in the EMR and have been reviewed as part of this encounter. ANESTHESIA/SEDATION: Moderate (conscious) sedation was employed during this procedure. A total of Versed 3 mg and Fentanyl 125 mcg were administered intravenously for moderate conscious sedation monitored under my direct supervision. Total intraservice time of sedation was 46 minute. The patient's vital signs were monitored throughout the procedure and recorded in the patient's medical record by the nurse. FLUOROSCOPY: Radiation Exposure Index (as provided by the fluoroscopic device): PSD [id number] mGy Kerma COMPLICATIONS: None immediate. PROCEDURE: Following a full explanation of the procedure along with the potential associated complications, an informed witnessed consent was obtained. The patient was placed in prone position on the angiography table. The thoracic spine region was prepped and draped in a sterile fashion. Under fluoroscopy, the T12 vertebral body was delineated and the skin area was marked. The skin was infiltrated with a 1% Lidocaine approximately 3 cm lateral to the spinous process projection on the right. Using a 22-gauge spinal needle, the soft issue and the peripedicular space and periosteum were infiltrated with Bupivacaine 0.5%. A skin incision was made at the access site. Subsequently, an 11-gauge Kyphon  trocar was inserted under fluoroscopic guidance until contact with the pedicle was obtained. The trocar was inserted under light hammer tapping into the pedicle until the posterior boundaries of the vertebral body was reached. The diamond mandrill was removed and one core biopsy wasobtained. The skin was infiltrated with a 1% Lidocaine approximately 3 cm lateral to the spinous process projection on the left. Using a 22-gauge spinal needle, the soft issue and the peripedicular space and periosteum were infiltrated with Bupivacaine 0.5%. A skin incision was made at the access site. Subsequently, an 11-gauge Kyphon trocar was inserted under fluoroscopic guidance until contact with the pedicle was obtained. The trocar was inserted under light hammer tapping into the pedicle until the posterior boundaries of the vertebral body was reached. The diamond mandrill was removed. A bone drill was coaxially advanced within the anterior third of the vertebral body and then exchanged for inflatable Kyphon balloons. These were centered within the mid-aspect of the vertebral body. The balloons were inflated to create a void to serve as a repository for the bone cement. Both balloons were deflated and through both cannulas, under continuous fluoroscopy guidance in the AP and lateral views, the vertebral body was filled with previously mixed polymethyl-methacrylate (PMMA) added to barium for opacification. Both cannulas were later removed. The access sites were cleaned and covered with a sterile bandage. IMPRESSION: 1. Successful fluoroscopy-guided bilateral transpedicular approach for T12 vertebral body core bone biopsy and kyphoplasty for treatment of osteoporotic fragility fracture. Bone samples obtained were sent for pathology analysis. 2. If the patient has known osteoporosis, recommend treatment as clinically indicated. If the patient's bone density status is unknown, DEXA scan is recommended. Electronically Signed   By:  Pedro Earls M.D.   On: 06/18/2022 10:05    Scheduled Meds:  atorvastatin  40 mg Oral Daily   Chlorhexidine Gluconate Cloth  6 each Topical Daily   cholecalciferol  1,000 Units Oral Daily   donepezil  5 mg Oral QHS   feeding supplement (GLUCERNA SHAKE)  237 mL Oral TID BM   ferrous sulfate  325 mg Oral Q breakfast   gabapentin  200 mg Oral TID   insulin aspart  0-9 Units Subcutaneous TID WC   lidocaine  1 patch Transdermal Q24H   lidocaine  1 patch Transdermal Q24H   magnesium  gluconate  500 mg Oral BID   metoprolol succinate  12.5 mg Oral Daily   morphine  15 mg Oral Q12H   pantoprazole  40 mg Oral Daily   polyethylene glycol  17 g Oral BID   senna-docusate  1 tablet Oral BID   tamsulosin  0.4 mg Oral Daily   Continuous Infusions:     LOS: 24 days   Darliss Cheney, MD [facility]  06/19/2022, 11:37 AM   *Please note that this is a verbal dictation therefore any spelling or grammatical errors are due to the "[employer]" system interpretation.  Please page via [place] and do not message via secure chat for urgent patient care matters. Secure chat can be used for non urgent patient care matters.  How to contact the [facility] Attending or Consulting provider [place] or covering provider during after hours [place], for this patient?  Check the care team in [facility] and look for a) attending/consulting [facility] provider listed and b) the [facility] team listed. Page or secure chat [phone]. Log into [email or web address] and use [place] universal password to access. If you do not have the password, please contact the hospital operator. Locate the [facility] provider you are looking for under [facility] and page to a number that you can be directly reached. If you still have difficulty reaching the provider, please page the [facility] (Director on Call) for the Hospitalists listed on amion for assistance.

## 2022-06-19 NOTE — Progress Notes (Signed)
Physical Therapy Treatment Patient Details Name: Wayne Stokes MRN: [id number] DOB: 04/03/1966 Today's Date: 06/19/2022   History of Present Illness 56 y.o. male presented 05/26/22 with c/o severe and progressively worsening low back pain that radiates into his left buttock and down his LLE. Imaging +acute T12 compression fracture and  L4/5 there is multifactorial severe spinal stenosis and severe bilateral foraminal stenosis. Neurosurgery consult with no acute surgical intervention needed. 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    PT Comments    Patient continues to progress towards goals. Patient required minA for bed mobility and minA+2 for sit to stand transfer. Required assistance to don brace sitting EOB. Able to ambulate 15' x3 with minA and RW + chair follow. In person interpreter utilized. Complains of R LE and back pain throughout session. Continue to recommend acute inpatient rehab (AIR) for post-acute therapy needs.     Recommendations for follow up therapy are one component of a multi-disciplinary discharge planning process, led by the attending physician.  Recommendations may be updated based on patient status, additional functional criteria and insurance authorization.  Follow Up Recommendations  Acute inpatient rehab (3hours/day) Can patient physically be transported by private vehicle: No   Assistance Recommended at Discharge Frequent or constant Supervision/Assistance  Patient can return home with the following A lot of help with bathing/dressing/bathroom;Assistance with cooking/housework;Assist for transportation;Help with stairs or ramp for entrance;Two people to help with walking and/or transfers   Equipment  Recommendations  BSC/3in1;Rolling Maycee Blasco (2 wheels)    Recommendations for Other Services       Precautions / Restrictions Precautions Precautions: Fall;Back Precaution Booklet Issued: No Precaution Comments: Reviewing back precautions s/p sx Required Braces or Orthoses: Spinal Brace Spinal Brace: Thoracolumbosacral orthotic;Applied in sitting position Restrictions Weight Bearing Restrictions: No     Mobility  Bed Mobility Overal bed mobility: Needs Assistance Bed Mobility: Rolling, Sidelying to Sit Rolling: Min assist Sidelying to sit: Min assist       General bed mobility comments: Min A to faciltiating hips and then elevate trunk    Transfers Overall transfer level: Needs assistance Equipment used: Rolling Felis Quillin (2 wheels) Transfers: Sit to/from Stand, Bed to chair/wheelchair/BSC Sit to Stand: Min assist, +2 safety/equipment           General transfer comment: Min A for power up and gaining balance    Ambulation/Gait Ambulation/Gait assistance: Min assist, +2 physical assistance, +2 safety/equipment Gait Distance (Feet): 15 Feet (+15', +15') Assistive device: Rolling Kalai Baca (2 wheels) Gait Pattern/deviations: Trunk flexed, Knee flexed in stance - left, Knee flexed in stance - right, Step-to pattern, Step-through pattern Gait velocity: decreased     General Gait Details: slow unsteady gait with minA for balance and chair follow. Fearful of falling but wanting to walk. Seated rest break every 15'   Stairs             Wheelchair Mobility    Modified Rankin (Stroke Patients Only)       Balance Overall balance assessment: Needs assistance, History of Falls Sitting-balance support: Feet supported, Single extremity supported Sitting balance-Leahy Scale: Fair Sitting balance - Comments: min guard for safety, total A to donn brace   Standing balance support: During functional activity, Reliant on assistive device for balance, Bilateral upper  extremity supported Standing  balance-Leahy Scale: Poor Standing balance comment: reliant on external support for balance and UE support.                            Cognition Arousal/Alertness: Awake/alert Behavior During Therapy: WFL for tasks assessed/performed Overall Cognitive Status: Impaired/Different from baseline Area of Impairment: Problem solving, Following commands, Safety/judgement                       Following Commands: Follows one step commands with increased time Safety/Judgement: Decreased awareness of safety   Problem Solving: Requires verbal cues General Comments: Increased time for following commands. Pt reporting he "wants to walk". More engaged and receptive        Exercises      General Comments General comments (skin integrity, edema, etc.): In person interpreter throughout      Pertinent Vitals/Pain Pain Assessment Pain Assessment: Faces Faces Pain Scale: Hurts whole lot Pain Location: Back, RLE Pain Descriptors / Indicators: Cramping, Discomfort, Grimacing, Guarding, Moaning Pain Intervention(s): Monitored during session    Home Living                          Prior Function            PT Goals (current goals can now be found in the care plan section) Acute Rehab PT Goals PT Goal Formulation: With patient Time For Goal Achievement: 06/25/22 Potential to Achieve Goals: Fair Progress towards PT goals: Progressing toward goals    Frequency    Min 4X/week      PT Plan Current plan remains appropriate    Co-evaluation PT/OT/SLP Co-Evaluation/Treatment: Yes Reason for Co-Treatment: For patient/therapist safety;To address functional/ADL transfers PT goals addressed during session: Mobility/safety with mobility;Balance;Proper use of DME OT goals addressed during session: ADL's and self-care      AM-PAC PT "6 Clicks" Mobility   Outcome Measure  Help needed turning from your back to your side while in  a flat bed without using bedrails?: A Little Help needed moving from lying on your back to sitting on the side of a flat bed without using bedrails?: A Lot Help needed moving to and from a bed to a chair (including a wheelchair)?: A Lot Help needed standing up from a chair using your arms (e.g., wheelchair or bedside chair)?: A Lot Help needed to walk in hospital room?: A Lot Help needed climbing 3-5 steps with a railing? : Total 6 Click Score: 12    End of Session Equipment Utilized During Treatment: Back brace Activity Tolerance: Patient limited by pain;Patient tolerated treatment well Patient left: in chair;with call bell/phone within reach;with chair alarm set Nurse Communication: Mobility status PT Visit Diagnosis: Other abnormalities of gait and mobility (R26.89);Muscle weakness (generalized) (M62.81)     Time: [id number] PT Time Calculation (min) (ACUTE ONLY): 31 min  Charges:  $Gait Training: 8-22 mins                     Kelon Easom A. Gilford Rile PT, DPT Acute Rehabilitation Services Office [phone]    Linna Hoff 06/19/2022, 5:08 PM

## 2022-06-19 NOTE — Progress Notes (Addendum)
Occupational Therapy Treatment Patient Details Name: Wayne Stokes MRN: [id number] DOB: May 10, 1966 Today's Date: 06/19/2022   History of present illness 56 y.o. male presented 05/26/22 with c/o severe and progressively worsening low back pain that radiates into his left buttock and down his LLE. Imaging +acute T12 compression fracture and  L4/5 there is multifactorial severe spinal stenosis and severe bilateral foraminal stenosis. Neurosurgery consult with no acute surgical intervention needed. 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   OT comments  Pt progressing towards established OT goals. Pt performing bed mobility using log roll technique with Min A. Pt requiring Min A for managing brace and donning; continues to present with decreased grasp and pinch strength. Pt performing sit<>stand and functional mobility with Min A +2 and RW. In person interpreter throughout. Continue to recommend dc to post-acute rehab and will continue to follow acutely as admitted.   Recommendations for follow up therapy are one component of a multi-disciplinary discharge planning process, led by the attending physician.  Recommendations may be updated based on patient status, additional functional criteria and insurance authorization.    Follow Up Recommendations  Acute inpatient rehab (3hours/day)    Assistance Recommended at Discharge Frequent or constant Supervision/Assistance  Patient can return home with the following  A lot of help with walking and/or transfers;A lot of help with bathing/dressing/bathroom   Equipment Recommendations  BSC/3in1    Recommendations for Other Services      Precautions / Restrictions Precautions Precautions:  Fall;Back Precaution Booklet Issued: No Precaution Comments: Reviewing back precautions s/p sx Required Braces or Orthoses: Spinal Brace Spinal Brace: Thoracolumbosacral orthotic;Applied in sitting position Restrictions Weight Bearing Restrictions: No       Mobility Bed Mobility Overal bed mobility: Needs Assistance Bed Mobility: Rolling, Sidelying to Sit Rolling: Min assist Sidelying to sit: Min assist       General bed mobility comments: Min A to faciltiating hips and then elevate trunk    Transfers Overall transfer level: Needs assistance Equipment used: Rolling walker (2 wheels) Transfers: Sit to/from Stand, Bed to chair/wheelchair/BSC Sit to Stand: Min assist, +2 safety/equipment           General transfer comment: Min A for power up and gaining balance     Balance Overall balance assessment: Needs assistance, History of Falls Sitting-balance support: Feet supported, Single extremity supported Sitting balance-Leahy Scale: Fair Sitting balance - Comments: min guard for safety, total A to donn brace   Standing balance support: During functional activity, Reliant on assistive device for balance, Bilateral upper extremity supported Standing balance-Leahy Scale: Poor Standing balance comment: reliant on external support for balance and UE support.                           ADL either performed or assessed with clinical judgement   ADL Overall ADL's : Needs assistance/impaired Eating/Feeding: Set up;Sitting Eating/Feeding Details (indicate cue type and reason): Set up for lunch             Upper Body Dressing : Minimal assistance;Sitting;Cueing for compensatory techniques;Cueing for sequencing Upper Body Dressing Details (indicate cue type and reason): Pt donning brace with assistance for placement and then locating clips for over shoulder straps. Pt benefiting from increased encouragement. Poor grasp  and pinch; may need to adapt brace     Toilet  Transfer: Minimal assistance;+2 for safety/equipment;Ambulation;Rolling walker (2 wheels) (simulated to recliner)           Functional mobility during ADLs: Minimal assistance;Rolling walker (2 wheels);+2 for safety/equipment General ADL Comments: Pt performing functional mobility in hallway again.    Extremity/Trunk Assessment Upper Extremity Assessment Upper Extremity Assessment: RUE deficits/detail;LUE deficits/detail RUE Deficits / Details: right shoulder pain. Decreased grasp strength and finger dexerity RUE Coordination: decreased fine motor;decreased gross motor LUE Deficits / Details: increased shoulder pain. Decreased grasp strength and finger dexerity LUE Coordination: decreased fine motor;decreased gross motor   Lower Extremity Assessment Lower Extremity Assessment: Defer to PT evaluation RLE: Unable to fully assess due to pain LLE: Unable to fully assess due to pain        Vision       Perception     Praxis      Cognition Arousal/Alertness: Awake/alert Behavior During Therapy: WFL for tasks assessed/performed Overall Cognitive Status: Impaired/Different from baseline Area of Impairment: Problem solving, Following commands, Safety/judgement                       Following Commands: Follows one step commands with increased time Safety/Judgement: Decreased awareness of safety   Problem Solving: Requires verbal cues General Comments: Increased time for following commands. Pt reporting he "wants to walk". More engaged and receptive        Exercises      Shoulder Instructions       General Comments In person interpreter throughout    Pertinent Vitals/ Pain       Pain Assessment Pain Assessment: Faces Faces Pain Scale: Hurts whole lot Pain Location: Back, RLE Pain Descriptors / Indicators: Cramping, Discomfort, Grimacing, Guarding, Moaning Pain Intervention(s): Monitored during session, Limited activity within patient's tolerance,  Repositioned  Home Living                                          Prior Functioning/Environment              Frequency  Min 2X/week        Progress Toward Goals  OT Goals(current goals can now be found in the care plan section)  Progress towards OT goals: Progressing toward goals  Acute Rehab OT Goals OT Goal Formulation: With patient Time For Goal Achievement: 06/26/22 Potential to Achieve Goals: Good ADL Goals Pt Will Perform Grooming: with min guard assist;standing Pt Will Perform Lower Body Dressing: sit to/from stand;with adaptive equipment;with min guard assist Pt Will Transfer to Toilet: with min guard assist;ambulating;bedside commode Additional ADL Goal #1: Pt will independently demonstrate 3/3 back precautions during ADLs Additional ADL Goal #2: Pt will independent don/doff brace in preparation for ADLs  Plan Discharge plan remains appropriate    Co-evaluation    PT/OT/SLP Co-Evaluation/Treatment: Yes Reason for Co-Treatment: For patient/therapist safety;To address functional/ADL transfers   OT goals addressed during session: ADL's and self-care      AM-PAC OT "6 Clicks" Daily Activity     Outcome Measure   Help from another person eating meals?: A Little Help from another person taking care of personal grooming?: A Little Help from another person toileting, which includes using toliet, bedpan, or urinal?: A Lot Help from another person bathing (including washing, rinsing, drying)?: A Lot Help from another person to put on  and taking off regular upper body clothing?: A Lot Help from another person to put on and taking off regular lower body clothing?: A Lot 6 Click Score: 14    End of Session Equipment Utilized During Treatment: Back brace;Rolling walker (2 wheels)  OT Visit Diagnosis: Other abnormalities of gait and mobility (R26.89);Muscle weakness (generalized) (M62.81);Pain Pain - Right/Left: Right Pain - part of body:  Shoulder   Activity Tolerance Patient tolerated treatment well   Patient Left in chair;with call bell/phone within reach;with chair alarm set   Nurse Communication Mobility status        Time: 9518-8416 OT Time Calculation (min): 32 min  Charges: OT General Charges $OT Visit: 1 Visit OT Treatments $Self Care/Home Management : 8-22 mins  Shellee Streng MSOT, OTR/L Acute Rehab Office: [phone]  Theodoro Grist Blanche Scovell 06/19/2022, 4:17 PM

## 2022-06-19 NOTE — Progress Notes (Signed)
Chief Complaint: Patient was seen today for post T12 KP  Supervising Physician: Pedro Earls  Patient Status: [facility] - Out-pt  Subjective: Pt s/p T12 kyphoplasty and biopsy yesterday Pt still with some soreness. Able to roll over in bed with mild discomfort. States still slow getting up out of bed.  Objective: Physical Exam: BP 110/67 (BP Location: Right Arm)   Pulse 99   Temp 98.4 F (36.9 C) (Oral)   Resp 12   Ht 5' (1.524 m)   Wt 136 lb 0.4 oz (61.7 kg)   SpO2 95%   BMI 26.57 kg/m  Back: Puncture sites clean, dry. No hematoma. Minimal tenderness   Current Facility-Administered Medications:    atorvastatin (LIPITOR) tablet 40 mg, 40 mg, Oral, Daily, Posey Pronto, Vishal R, MD, 40 mg at 06/19/22 1016   bisacodyl (DULCOLAX) suppository 10 mg, 10 mg, Rectal, Daily PRN, Raiford Noble Latif, DO, 10 mg at 06/17/22 4540   Chlorhexidine Gluconate Cloth 2 % PADS 6 each, 6 each, Topical, Daily, Danford, Suann Larry, MD, 6 each at 06/19/22 1230   cholecalciferol (VITAMIN D3) 25 MCG (1000 UNIT) tablet 1,000 Units, 1,000 Units, Oral, Daily, Lavina Hamman, MD, 1,000 Units at 06/19/22 1016   donepezil (ARICEPT) tablet 5 mg, 5 mg, Oral, QHS, Patel, Vishal R, MD, 5 mg at 06/18/22 2213   feeding supplement (GLUCERNA SHAKE) (GLUCERNA SHAKE) liquid 237 mL, 237 mL, Oral, TID BM, Cherene Altes, MD, 237 mL at 06/19/22 1120   ferrous sulfate tablet 325 mg, 325 mg, Oral, Q breakfast, Danford, Suann Larry, MD, 325 mg at 06/19/22 1016   gabapentin (NEURONTIN) capsule 200 mg, 200 mg, Oral, TID, Cherene Altes, MD, 200 mg at 06/19/22 1016   insulin aspart (novoLOG) injection 0-9 Units, 0-9 Units, Subcutaneous, TID WC, Sheikh, Omair Latif, DO, 1 Units at 06/18/22 1145   lidocaine (LIDODERM) 5 % 1 patch, 1 patch, Transdermal, Q24H, Sheikh, [place], DO, 1 patch at 06/18/22 1345   lidocaine (LIDODERM) 5 % 1 patch, 1 patch, Transdermal, Q24H, Adhikari, Amrit, MD, 1 patch at  06/19/22 1017   magnesium gluconate (MAGONATE) tablet 500 mg, 500 mg, Oral, BID, Joette Catching T, MD, 500 mg at 06/19/22 1016   methocarbamol (ROBAXIN) tablet 500 mg, 500 mg, Oral, Q8H PRN, Joette Catching T, MD, 500 mg at 06/18/22 2214   metoprolol succinate (TOPROL-XL) 24 hr tablet 12.5 mg, 12.5 mg, Oral, Daily, Joette Catching T, MD, 12.5 mg at 06/19/22 1016   morphine (MS CONTIN) 12 hr tablet 15 mg, 15 mg, Oral, Q12H, Joette Catching T, MD, 15 mg at 06/19/22 1016   Muscle Rub CREA 1 Application, 1 Application, Topical, PRN, Raiford Noble Latif, DO, 1 Application at 98/11/91 2111   ondansetron (ZOFRAN) tablet 4 mg, 4 mg, Oral, Q6H PRN, 4 mg at 06/18/22 1142 **OR** [DISCONTINUED] ondansetron (ZOFRAN) injection 4 mg, 4 mg, Intravenous, Q6H PRN, Zada Finders R, MD, 4 mg at 06/01/22 2109   Oral care mouth rinse, 15 mL, Mouth Rinse, PRN, Cherene Altes, MD   oxyCODONE (Oxy IR/ROXICODONE) immediate release tablet 10 mg, 10 mg, Oral, Q4H PRN, Cherene Altes, MD, 10 mg at 06/17/22 0932   pantoprazole (PROTONIX) EC tablet 40 mg, 40 mg, Oral, Daily, Adhikari, Amrit, MD, 40 mg at 06/19/22 1016   polyethylene glycol (MIRALAX / GLYCOLAX) packet 17 g, 17 g, Oral, BID, Sheikh, Omair Latif, DO, 17 g at 06/19/22 1019   senna-docusate (Senokot-S) tablet 1 tablet, 1 tablet, Oral, BID,  [address] [place], [place], 1 tablet at 06/19/22 1016   simethicone (MYLICON) chewable tablet 80 mg, 80 mg, Oral, QID PRN, Opyd, Lavone Neri, MD, 80 mg at 06/02/22 0451   sodium chloride (PF) 0.9 % injection 10 mL, 10 mL, Other, PRN, Kathlynn Grate, DO, 10 mL at 06/11/22 1019   tamsulosin (FLOMAX) capsule 0.4 mg, 0.4 mg, Oral, Daily, Azucena Fallen, MD, 0.4 mg at 06/19/22 1015   traMADol (ULTRAM) tablet 50 mg, 50 mg, Oral, Q6H PRN, Lonia Blood, MD, 50 mg at 06/14/22 1733  Labs: CBC Recent Labs    06/17/22 0117 06/18/22 0012  WBC 8.9 8.5  HGB 7.9* 8.1*  HCT 23.7* 25.5*  PLT 889* 722*   BMET Recent  Labs    06/17/22 0117 06/18/22 0012  NA 133* 131*  K 3.1* 3.7  CL 92* 93*  CO2 30 30  GLUCOSE 105* 223*  BUN 5* 5*  CREATININE 0.77 0.84  CALCIUM 9.7 9.5   LFT Recent Labs    06/17/22 1504 06/18/22 0012  PROT 7.3 6.2*  ALBUMIN 1.7* <1.5*  AST 21 20  ALT 8 8  ALKPHOS 94 91  BILITOT 0.8 0.4  BILIDIR 0.2  --   IBILI 0.6  --    PT/INR Recent Labs    06/18/22 0012  LABPROT 15.1  INR 1.2     Studies/Results: IR KYPHO LUMBAR INC FX REDUCE BONE BX UNI/BIL CANNULATION INC/IMAGING  Result Date: 06/18/2022 INDICATION: 56 year old male with T12 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 He presents today for a core biopsy balloon kyphoplasty. EXAM: FLUOROSCOPY GUIDED T12 CORE BONE BIOPSY AND BALLOON KYPHOPLASTY COMPARISON:  CT of the abdomen June 17, 2022; MRI of the lumbar spine May 26, 2022. MEDICATIONS: As antibiotic prophylaxis, 2 g of Ancef was ordered pre-procedure and administered intravenously within 1 hour of incision. All current medications are in the EMR and have been reviewed as part of this encounter. ANESTHESIA/SEDATION: Moderate (conscious) sedation was employed during this procedure. A total of Versed 3 mg and Fentanyl 125 mcg were administered intravenously for moderate conscious sedation monitored under my direct supervision. Total intraservice time of sedation was 46 minute. The patient's vital signs were monitored throughout the procedure and recorded in the patient's medical record by the nurse. FLUOROSCOPY: Radiation Exposure Index (as provided by the fluoroscopic device): PSD 1,665 mGy Kerma COMPLICATIONS: None immediate. PROCEDURE: Following a full explanation of the procedure along with the potential associated complications, an informed witnessed consent was obtained. The patient was placed in prone position on the angiography table. The thoracic spine region was prepped and draped in a sterile fashion. Under fluoroscopy, the  T12 vertebral body was delineated and the skin area was marked. The skin was infiltrated with a 1% Lidocaine approximately 3 cm lateral to the spinous process projection on the right. Using a 22-gauge spinal needle, the soft issue and the peripedicular space and periosteum were infiltrated with Bupivacaine 0.5%. A skin incision was made at the access site. Subsequently, an 11-gauge Kyphon trocar was inserted under fluoroscopic guidance until contact with the pedicle was obtained. The trocar was inserted under light hammer tapping into the pedicle until the posterior boundaries of the vertebral body was reached. The diamond mandrill was removed and one core biopsy wasobtained. The skin was infiltrated with a 1% Lidocaine approximately 3 cm lateral to the spinous process projection on the left. Using a 22-gauge spinal needle, the soft issue and the peripedicular space and periosteum were  infiltrated with Bupivacaine 0.5%. A skin incision was made at the access site. Subsequently, an 11-gauge Kyphon trocar was inserted under fluoroscopic guidance until contact with the pedicle was obtained. The trocar was inserted under light hammer tapping into the pedicle until the posterior boundaries of the vertebral body was reached. The diamond mandrill was removed. A bone drill was coaxially advanced within the anterior third of the vertebral body and then exchanged for inflatable Kyphon balloons. These were centered within the mid-aspect of the vertebral body. The balloons were inflated to create a void to serve as a repository for the bone cement. Both balloons were deflated and through both cannulas, under continuous fluoroscopy guidance in the AP and lateral views, the vertebral body was filled with previously mixed polymethyl-methacrylate (PMMA) added to barium for opacification. Both cannulas were later removed. The access sites were cleaned and covered with a sterile bandage. IMPRESSION: 1. Successful fluoroscopy-guided  bilateral transpedicular approach for T12 vertebral body core bone biopsy and kyphoplasty for treatment of osteoporotic fragility fracture. Bone samples obtained were sent for pathology analysis. 2. If the patient has known osteoporosis, recommend treatment as clinically indicated. If the patient's bone density status is unknown, DEXA scan is recommended. Electronically Signed   By: Baldemar Lenis M.D.   On: 06/18/2022 10:05    Assessment/Plan: S/p T12 KP with biopsy. Pathology pending Pt doing fairly well. Some residual soreness as expected. Cont to work with PT and use RW for next at least the next 2 weeks.    LOS: 24 days   I spent a total of 15 minutes in face to face in clinical consultation, greater than 50% of which was counseling/coordinating care for T12 KP  Brayton El PA-C 06/19/2022 1:36 PM

## 2022-06-20 ENCOUNTER — Inpatient Hospital Stay (HOSPITAL_COMMUNITY): Payer: Medicaid Other

## 2022-06-20 LAB — BASIC METABOLIC PANEL
Anion gap: 6 (ref 5–15)
BUN: 6 mg/dL (ref 6–20)
CO2: 30 mmol/L (ref 22–32)
Calcium: 9.3 mg/dL (ref 8.9–10.3)
Chloride: 95 mmol/L — ABNORMAL LOW (ref 98–111)
Creatinine, Ser: 0.9 mg/dL (ref 0.61–1.24)
GFR, Estimated: >60 mL/min (ref >60)
Glucose, Bld: 87 mg/dL (ref 70–99)
Potassium: 3.5 mmol/L (ref 3.5–5.1)
Sodium: 131 mmol/L — ABNORMAL LOW (ref 135–145)

## 2022-06-20 LAB — GLUCOSE, CAPILLARY
Glucose-Capillary: 90 mg/dL (ref 70–99)
Glucose-Capillary: 91 mg/dL (ref 70–99)
Glucose-Capillary: 136 mg/dL — ABNORMAL HIGH (ref 70–99)
Glucose-Capillary: 125 mg/dL — ABNORMAL HIGH (ref 70–99)
Glucose-Capillary: 113 mg/dL — ABNORMAL HIGH (ref 70–99)

## 2022-06-20 MED ORDER — MAGNESIUM SULFATE 2 GM/50ML IV SOLN
2.0000 g | Freq: Once | INTRAVENOUS | Status: AC
  Administered 2022-06-20: 2 g via INTRAVENOUS
  Filled 2022-06-20: qty 50

## 2022-06-20 NOTE — Progress Notes (Signed)
PROGRESS NOTE    Wayne Stokes  XIP:[id number] DOB: [birth year] DOA: 05/26/2022 PCP: [name], NP   Brief Narrative:  Patient is a 56 year old male from [place] who peaks [place], with history of diabetes type 2, hypertension, lumbar disc degenerative disease with spinal stenosis/ claudication, T12 vertebral compression fracture, mild cognitive impairment who was admitted for the management of uncontrolled low back pain, ambulatory difficulty due to severe lumbar spine stenosis and T12 compression fracture.  Orthopedics, neurosurgery evaluated the patient and recommended TLSO brace, pain control and outpatient follow-up.  PT recommending acute inpatient rehab.  Difficult placement due to lack of insurance.LLOS. IR planning for T12 kyphoplasty for persistent back pain.  Assessment & Plan:   Principal Problem:   Pyogenic arthritis of right shoulder region [facility]) Active Problems:   Spinal stenosis of lumbar region with neurogenic claudication   T12 compression fracture (HCC)   AKI (acute kidney injury) (HCC)   Hyponatremia   Acute urinary retention   Type 2 diabetes mellitus without complication, without long-term current use of insulin (HCC)   Leukocytosis   Hypertension associated with diabetes (HCC)   Iron deficiency anemia   Hyperlipidemia associated with type 2 diabetes mellitus (HCC)   Mild cognitive impairment   Thrush, oral   Right shoulder pain   Discitis of lumbar region   Chronic low back pain with bilateral sciatica  Severe back pain/lumbar spinal stenosis/T12 compression fracture: Presented with severe low back pain, ambulatory dysfunction.  MRI did not show any cord injury.  Neurosurgery consulted, no surgical intervention recommended, recommended TLSO brace.  IR was also consulted for consideration of T12 kyphoplasty but this was canceled due to fever.  Requested for kyphoplasty again because he does not have any signs of sepsis.  Patient eventually underwent  kyphoplasty on 06/18/2022.  Pain is controlled.  Seen by PT OT who recommends SNF but that is going to be challenging as he does not have insurance.   Fever/leukocytosis: Unclear source. Now resolved.  Chest x-ray did not show any pneumonia.  COVID-negative.  MRI of the spine on 9/18 was concerning for infection/discitis at L4 level.  Disc Aspiration was completed on 9/25 by IR.  He started on IV vancomycin, ceftriaxone.  Culture from spinal aspiration negative.  Patient had ongoing pain on the right shoulder, MRI was done which was suggestive of septic arthritis.  Orthopedics aspirated the shoulder on 9/27 with no fluid.  IR aspirated right shoulder on 9/29 without finding of any infection.  ID was also consulted, antibiotics discontinued.  Currently off antibiotics.    Right shoulder pain: Persistent.  Continue local anesthetics, oral pain medications.  Consideration of steroid injection in the shoulder as an outpatient.  Continue pain management.   Abdominal discomfort/microcytic  iron deficiency anemia: Continue iron supplementation.  Iron studies showed low iron as per 9/29.  No evidence of active bleeding.  Unaware about any hematochezia or melena. Transfused with a unit of PRBC on 9/28.  Continue monitoring,Hb in the range of 7-8.  Also he was complaining of abdominal pain so abdominal CT was obtained on 06/17/2022 which did not show any acute findings, distal CBD was slightly dilated but no stones or obstruction or lesion.  He is now complaining of nausea and vomiting as well as not passing flatus.  Will obtain abdominal x-ray to rule out SBO/ileus.   Acute urinary retention/AKI: Felt to be secondary to back pain, mobility.  Started on Flomax.  Renal function is stabilized.  Failed voiding trial twice  so Foley in place. we recommend follow-up with urology as an outpatient.     Constipation: Continue bowel regimen   Vitamin D deficiency: Continue supplementation   Hyponatremia :SIADH panel  mostly negative.  Stable between 130-133 now  Hypomagnesemia: 1.5.  Will replace.   Diabetes type 2: Recent A1c of 7.4.  Blood sugar controlled.   Hypertension: Blood pressure well controlled   Hyperlipidemia: Continue Lipitor   Mild cognitive impairment: Continue donepezil   Sinus tachycardia: Most likely secondary to pain.  It is intermittent.  Continue to monitor   Disposition: Waiting for SNF.  Patient carries no insurance.  Will be difficult to place per TOC.  DVT prophylaxis: SCDs Start: 05/26/22 2254   Code Status: Full Code  Family Communication:  None present at bedside.  Plan of care discussed with patient in length and he/she verbalized understanding and agreed with it.  Status is: Inpatient Remains inpatient appropriate because: Medically stable, needs SNF placement.   Estimated body mass index is 26.57 kg/m as calculated from the following:   Height as of this encounter: 5' (1.524 m).   Weight as of this encounter: 61.7 kg.    Nutritional Assessment: Body mass index is 26.57 kg/m.Marland Kitchen Seen by dietician.  I agree with the assessment and plan as outlined below: Nutrition Status: Nutrition Problem: Increased nutrient needs Etiology: post-op healing Signs/Symptoms: other (comment) (Compression fracture) Interventions: Glucerna shake  . Skin Assessment: I have examined the patient's skin and I agree with the wound assessment as performed by the wound care RN as outlined below:    Consultants:  IR Infectious disease Procedures:  As above  Antimicrobials:  Anti-infectives (From admission, onward)    Start     Dose/Rate Route Frequency Ordered Stop   06/18/22 0839  ceFAZolin (ANCEF) IVPB 2g/100 mL premix        over 30 Minutes Intravenous Continuous PRN 06/18/22 0852 06/18/22 0839   06/18/22 0500  ceFAZolin (ANCEF) IVPB 2g/100 mL premix        2 g 200 mL/hr over 30 Minutes Intravenous To Short Stay 06/17/22 1338 06/19/22 0500   06/03/22 0700  vancomycin  (VANCOREADY) IVPB 750 mg/150 mL  Status:  Discontinued        750 mg 150 mL/hr over 60 Minutes Intravenous Every 12 hours 06/02/22 1848 06/06/22 1429   06/02/22 1945  vancomycin (VANCOCIN) IVPB 1000 mg/200 mL premix        1,000 mg 200 mL/hr over 60 Minutes Intravenous  Once 06/02/22 1847 06/02/22 2109   06/02/22 1930  cefTRIAXone (ROCEPHIN) 2 g in sodium chloride 0.9 % 100 mL IVPB  Status:  Discontinued        2 g 200 mL/hr over 30 Minutes Intravenous Every 24 hours 06/02/22 1835 06/06/22 1429   06/02/22 0000  ceFAZolin (ANCEF) IVPB 2g/100 mL premix  Status:  Discontinued        2 g 200 mL/hr over 30 Minutes Intravenous To Radiology 05/30/22 1504 06/02/22 1835         Subjective:  In person interpreter used.  Patient complains of left lower quadrant abdominal pain and some back pain but mainly abdominal pain is his concern.  Also has nausea and vomiting this morning.  He claims that he is not passing flatus and does not remember when his last bowel movement was.  Objective: Vitals:   06/19/22 1650 06/19/22 1942 06/20/22 0430 06/20/22 0816  BP: 107/83 (!) 127/96 120/84 115/87  Pulse: 99 (!) 105 96 92  Resp:  18 16   Temp:  98.4 F (36.9 C) 98 F (36.7 C)   TempSrc:  Oral Oral   SpO2: 94% 98% 90%   Weight:      Height:        Intake/Output Summary (Last 24 hours) at 06/20/2022 1030 Last data filed at 06/20/2022 0433 Gross per 24 hour  Intake --  Output 400 ml  Net -400 ml    Filed Weights   06/04/22 0315 06/06/22 0500 06/07/22 0500  Weight: 60 kg 62.8 kg 61.7 kg    Examination:  General exam: Appears calm and comfortable  Respiratory system: Clear to auscultation. Respiratory effort normal. Cardiovascular system: S1 & S2 heard, RRR. No JVD, murmurs, rubs, gallops or clicks. No pedal edema. Gastrointestinal system: Abdomen is nondistended, soft and left lower quadrant tenderness. No organomegaly or masses felt. Normal bowel sounds heard. Central nervous system:  Alert and oriented. No focal neurological deficits. Extremities: Symmetric 5 x 5 power. Skin: No rashes, lesions or ulcers.   Data Reviewed: I have personally reviewed following labs and imaging studies  CBC: Recent Labs  Lab 06/14/22 0216 06/15/22 0319 06/16/22 0801 06/17/22 0117 06/18/22 0012  WBC 8.1 8.6 11.3* 8.9 8.5  HGB 7.2* 7.5* 8.9* 7.9* 8.1*  HCT 21.9* 22.3* 26.8* 23.7* 25.5*  MCV 71.6* 71.2* 70.5* 69.7* 72.4*  PLT 735* 733* 864* 889* 722*    Basic Metabolic Panel: Recent Labs  Lab 06/15/22 0319 06/16/22 0801 06/17/22 0117 06/18/22 0012 06/19/22 1350 06/20/22 0450  NA 130* 133* 133* 131*  --  131*  K 3.5 3.3* 3.1* 3.7  --  3.5  CL 94* 92* 92* 93*  --  95*  CO2 29 28 30 30   --  30  GLUCOSE 80 83 105* 223*  --  87  BUN 5* <5* 5* 5*  --  6  CREATININE 0.76 0.88 0.77 0.84  --  0.90  CALCIUM 9.5 10.3 9.7 9.5  --  9.3  MG  --   --  1.6*  --  1.5*  --     GFR: Estimated Creatinine Clearance: 70.1 mL/min (by C-G formula based on SCr of 0.9 mg/dL). Liver Function Tests: Recent Labs  Lab 06/17/22 1504 06/18/22 0012  AST 21 20  ALT 8 8  ALKPHOS 94 91  BILITOT 0.8 0.4  PROT 7.3 6.2*  ALBUMIN 1.7* <1.5*    No results for input(s): "LIPASE", "AMYLASE" in the last 168 hours. No results for input(s): "AMMONIA" in the last 168 hours. Coagulation Profile: Recent Labs  Lab 06/18/22 0012  INR 1.2    Cardiac Enzymes: No results for input(s): "CKTOTAL", "CKMB", "CKMBINDEX", "TROPONINI" in the last 168 hours. BNP (last 3 results) No results for input(s): "PROBNP" in the last 8760 hours. HbA1C: No results for input(s): "HGBA1C" in the last 72 hours. CBG: Recent Labs  Lab 06/19/22 0741 06/19/22 1201 06/19/22 1626 06/20/22 0603 06/20/22 0747  GLUCAP 102* 117* 195* 90 91    Lipid Profile: No results for input(s): "CHOL", "HDL", "LDLCALC", "TRIG", "CHOLHDL", "LDLDIRECT" in the last 72 hours. Thyroid Function Tests: No results for input(s): "TSH",  "T4TOTAL", "FREET4", "T3FREE", "THYROIDAB" in the last 72 hours. Anemia Panel: No results for input(s): "VITAMINB12", "FOLATE", "FERRITIN", "TIBC", "IRON", "RETICCTPCT" in the last 72 hours. Sepsis Labs: No results for input(s): "PROCALCITON", "LATICACIDVEN" in the last 168 hours.  No results found for this or any previous visit (from the past 240 hour(s)).   Radiology Studies: No results found.  Scheduled Meds:  atorvastatin  40 mg Oral Daily   Chlorhexidine Gluconate Cloth  6 each Topical Daily   cholecalciferol  1,000 Units Oral Daily   donepezil  5 mg Oral QHS   feeding supplement (GLUCERNA SHAKE)  237 mL Oral TID BM   ferrous sulfate  325 mg Oral Q breakfast   gabapentin  200 mg Oral TID   insulin aspart  0-9 Units Subcutaneous TID WC   lidocaine  1 patch Transdermal Q24H   lidocaine  1 patch Transdermal Q24H   magnesium gluconate  500 mg Oral BID   metoprolol succinate  12.5 mg Oral Daily   morphine  15 mg Oral Q12H   pantoprazole  40 mg Oral Daily   polyethylene glycol  17 g Oral BID   senna-docusate  1 tablet Oral BID   tamsulosin  0.4 mg Oral Daily   Continuous Infusions:     LOS: 25 days   Hughie Closs, MD [facility]  06/20/2022, 10:30 AM   *Please note that this is a verbal dictation therefore any spelling or grammatical errors are due to the "Dragon Medical One" system interpretation.  Please page via Amion and do not message via secure chat for urgent patient care matters. Secure chat can be used for non urgent patient care matters.  How to contact the [facility] Attending or Consulting provider 7A - 7P or covering provider during after hours 7P -7A, for this patient?  Check the care team in [facility] and look for a) attending/consulting TRH provider listed and b) the [facility] team listed. Page or secure chat 7A-7P. Log into [email or web address] and use [place]'s universal password to access. If you do not have the password, please contact the hospital operator. Locate  the [facility] provider you are looking for under [facility] and page to a number that you can be directly reached. If you still have difficulty reaching the provider, please page the [facility] (Director on Call) for the Hospitalists listed on amion for assistance.

## 2022-06-20 NOTE — Progress Notes (Signed)
Physical Therapy Treatment Patient Details Name: Wayne Stokes MRN: [id number] DOB: 1966/05/23 Today's Date: 06/20/2022   History of Present Illness 56 y.o. male presented 05/26/22 with c/o severe and progressively worsening low back pain that radiates into his left buttock and down his LLE. Imaging +acute T12 compression fracture and  L4/5 there is multifactorial severe spinal stenosis and severe bilateral foraminal stenosis. Neurosurgery consult with no acute surgical intervention needed. 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    PT Comments    Patient eager to mobilize on arrival. Patient able to ambulate 20' x 4 during session with seated rest break between each bout. MinA required for balance and +2 for chair follow. In person interpreter utilized and helpful. Patient continues to complain about L knee and low back pain with mobility but motivated to continue walking. Continue to recommend acute inpatient rehab (AIR) for post-acute therapy needs.     Recommendations for follow up therapy are one component of a multi-disciplinary discharge planning process, led by the attending physician.  Recommendations may be updated based on patient status, additional functional criteria and insurance authorization.  Follow Up Recommendations  Acute inpatient rehab (3hours/day) Can patient physically be transported by private vehicle: No   Assistance Recommended at Discharge Frequent or constant Supervision/Assistance  Patient can return home with the following A lot of help with bathing/dressing/bathroom;Assistance with cooking/housework;Assist for transportation;Help with stairs or ramp for entrance;Two people to help with walking and/or  transfers;A little help with walking and/or transfers   Equipment Recommendations  BSC/3in1;Rolling Sareena Odeh (2 wheels)    Recommendations for Other Services       Precautions / Restrictions Precautions Precautions: Fall;Back Precaution Booklet Issued: No Required Braces or Orthoses: Spinal Brace Spinal Brace: Thoracolumbosacral orthotic;Applied in sitting position Restrictions Weight Bearing Restrictions: No     Mobility  Bed Mobility               General bed mobility comments: in chair on arrival    Transfers Overall transfer level: Needs assistance Equipment used: Rolling Thoren Hosang (2 wheels) Transfers: Sit to/from Stand Sit to Stand: Min assist           General transfer comment: assist to steady upon standing x 4 during session    Ambulation/Gait Ambulation/Gait assistance: Min assist, +2 safety/equipment Gait Distance (Feet): 20 Feet (x4) Assistive device: Rolling Angelino Rumery (2 wheels) Gait Pattern/deviations: Trunk flexed, Knee flexed in stance - left, Knee flexed in stance - right, Step-to pattern, Step-through pattern Gait velocity: decreased     General Gait Details: able to complete 4 short bouts of ambulation with seated rest break in between. complaining of L knee and low back pain throughout. Assist for balacne   Stairs             Wheelchair Mobility    Modified Rankin (Stroke Patients Only)       Balance Overall balance assessment: Needs assistance, History of Falls Sitting-balance support: Feet supported, Single extremity supported Sitting balance-Leahy Scale: Fair     Standing balance support: During functional activity, Reliant on assistive device for balance, Bilateral upper extremity supported Standing balance-Leahy Scale: Poor  Cognition Arousal/Alertness: Awake/alert Behavior During Therapy: WFL for tasks assessed/performed Overall Cognitive Status: Impaired/Different from  baseline Area of Impairment: Problem solving, Following commands, Safety/judgement                       Following Commands: Follows one step commands with increased time Safety/Judgement: Decreased awareness of safety   Problem Solving: Requires verbal cues          Exercises      General Comments General comments (skin integrity, edema, etc.): In person interpreter throughout      Pertinent Vitals/Pain Pain Assessment Pain Assessment: Faces Faces Pain Scale: Hurts even more Pain Location: back, L knee Pain Descriptors / Indicators: Cramping, Discomfort, Grimacing, Guarding, Moaning Pain Intervention(s): Limited activity within patient's tolerance, Monitored during session, Repositioned    Home Living                          Prior Function            PT Goals (current goals can now be found in the care plan section) Acute Rehab PT Goals PT Goal Formulation: With patient Time For Goal Achievement: 06/25/22 Potential to Achieve Goals: Fair Progress towards PT goals: Progressing toward goals    Frequency    Min 4X/week      PT Plan Current plan remains appropriate    Co-evaluation              AM-PAC PT "6 Clicks" Mobility   Outcome Measure  Help needed turning from your back to your side while in a flat bed without using bedrails?: A Little Help needed moving from lying on your back to sitting on the side of a flat bed without using bedrails?: A Little Help needed moving to and from a bed to a chair (including a wheelchair)?: A Little Help needed standing up from a chair using your arms (e.g., wheelchair or bedside chair)?: A Little Help needed to walk in hospital room?: Total Help needed climbing 3-5 steps with a railing? : Total 6 Click Score: 14    End of Session Equipment Utilized During Treatment: Back brace Activity Tolerance: Patient limited by pain;Patient tolerated treatment well Patient left: in chair;with call  bell/phone within reach;with chair alarm set Nurse Communication: Mobility status PT Visit Diagnosis: Other abnormalities of gait and mobility (R26.89);Muscle weakness (generalized) (M62.81)     Time: 4403-4742 PT Time Calculation (min) (ACUTE ONLY): 32 min  Charges:  $Gait Training: 23-37 mins                     [place] Carmen A. Gilford Rile PT, DPT Acute Rehabilitation Services Office [phone]    Linna Hoff 06/20/2022, 4:47 PM

## 2022-06-20 NOTE — TOC Progression Note (Signed)
Transition of Care [facility]) - Progression Note    Patient Details  Name: Wayne Stokes MRN: [id number] Date of Birth: 06-19-1966  Transition of Care [facility]) CM/SW Contact  Joanne Chars, LCSW Phone Number: 06/20/2022, 10:38 AM  Clinical Narrative:   Per QC meeting yesterday, CSW reached out [facility] and [facility] about accepting pt under LOG.  [facility] cannot offer bed, language barrier. [facility] currently reviewing.    Expected Discharge Plan: [place] Barriers to Discharge: Continued Medical Work up  Expected Discharge Plan and Services Expected Discharge Plan: [place] In-house Referral: Development worker, community Discharge Planning Services: CM Consult                                           Social Determinants of Health (SDOH) Interventions    Readmission Risk Interventions     No data to display

## 2022-06-20 NOTE — Progress Notes (Signed)
Mobility Specialist Progress Note   06/20/22 1249  Mobility  Activity Ambulated with assistance to bathroom;Transferred from bed to chair  Level of Assistance Minimal assist, patient does 75% or more  Assistive Device Front wheel walker  Distance Ambulated (ft) 14 ft  Activity Response Tolerated fair  $Mobility charge 1 Mobility   Received in bed c/o of R shoulder and back pain but agreeable. Requiring minA to donn brace and ambulate. Transferred to BR d/t BM while sitting EOB, pt able to ambulate to BR to continue BM. Mobility limited by pain and language barrier today and pt deferring hallway ambulation and only able to ambulate back to chair. Left in chair w/ call bell in reach and RN notified about pain level.    Holland Falling Mobility Specialist MS [facility] #:  [phone] Acute Rehab Office:  [phone]

## 2022-06-21 LAB — GLUCOSE, CAPILLARY
Glucose-Capillary: 80 mg/dL (ref 70–99)
Glucose-Capillary: 119 mg/dL — ABNORMAL HIGH (ref 70–99)
Glucose-Capillary: 177 mg/dL — ABNORMAL HIGH (ref 70–99)
Glucose-Capillary: 93 mg/dL (ref 70–99)

## 2022-06-21 MED ORDER — NYSTATIN 100000 UNIT/ML MT SUSP
5.0000 mL | Freq: Four times a day (QID) | OROMUCOSAL | Status: DC
  Administered 2022-06-21 – 2022-06-27 (×24): 500000 [IU] via ORAL
  Filled 2022-06-21 (×25): qty 5

## 2022-06-21 NOTE — Progress Notes (Signed)
Mobility Specialist Progress Note   06/21/22 0900  Mobility  Activity Refused mobility;Dangled on edge of bed  Level of Assistance Minimal assist, patient does 75% or more  Assistive Device None  Range of Motion/Exercises Active;All extremities   Patient received in supine reluctantly agreeable to participate. Deferred OOB but agreed to dangle for EOB for breakfast. Required minimal HHA to EOB from supine to sit. Upon dangling, pt with significant 10/10 pain and could not tolerate sitting longer than a few minutes. Was assisted back into supine and was left with all needs met, call bell in reach.   [place] Javae Braaten, [place], [place]  SUORV:[phone] Office: [phone]

## 2022-06-21 NOTE — Progress Notes (Signed)
Mobility Specialist Progress Note   06/21/22 1516  Mobility  Activity Ambulated with assistance in room;Ambulated with assistance to bathroom  Level of Assistance Minimal assist, patient does 75% or more  Assistive Device Front wheel walker  Distance Ambulated (ft) 15 ft  Range of Motion/Exercises Active;All extremities   Patient received in supine requesting assistance to and from restroom. Declined hallway ambulation or sitting in recliner chair. Stated "I will do that at 5pm". Required min A to EOB from supine to sit. Stood and ambulated to and from restroom minA-Min G with slow gait. Limited by pain and fatigue. Returned to supine without complaint or incident. Was left with all needs met, call bell in reach.   [place] Nadiyah Zeis, [place], [place]  LKGMW:[phone] Office: [phone]

## 2022-06-21 NOTE — Progress Notes (Signed)
PROGRESS NOTE    Wayne Stokes  ZDG:[id number] DOB: Jan 03, 1966 DOA: 05/26/2022 PCP: [name], NP   Brief Narrative:  Patient is a 56 year old male from [place] who [place], with history of diabetes type 2, hypertension, lumbar disc degenerative disease with spinal stenosis/ claudication, T12 vertebral compression fracture, mild cognitive impairment who was admitted for the management of uncontrolled low back pain, ambulatory difficulty due to severe lumbar spine stenosis and T12 compression fracture.  Orthopedics, neurosurgery evaluated the patient and recommended TLSO brace, pain control and outpatient follow-up.  PT recommending acute inpatient rehab.  Difficult placement due to lack of insurance.LLOS. IR planning for T12 kyphoplasty for persistent back pain.  Assessment & Plan:   Principal Problem:   Pyogenic arthritis of right shoulder region [facility]) Active Problems:   Spinal stenosis of lumbar region with neurogenic claudication   T12 compression fracture (HCC)   AKI (acute kidney injury) ([place])   Hyponatremia   Acute urinary retention   Type 2 diabetes mellitus without complication, without long-term current use of insulin (HCC)   Leukocytosis   Hypertension associated with diabetes ([place])   Iron deficiency anemia   Hyperlipidemia associated with type 2 diabetes mellitus (HCC)   Mild cognitive impairment   Thrush, oral   Right shoulder pain   Discitis of lumbar region   Chronic low back pain with bilateral sciatica  Severe back pain/lumbar spinal stenosis/T12 compression fracture: Presented with severe low back pain, ambulatory dysfunction.  MRI did not show any cord injury.  Neurosurgery consulted, no surgical intervention recommended, recommended TLSO brace.  IR was also consulted for consideration of T12 kyphoplasty but this was canceled due to fever.  Requested for kyphoplasty again because he does not have any signs of sepsis.  Patient eventually underwent  kyphoplasty on 06/18/2022.  Pain is controlled.  Seen by PT OT who recommends SNF but that is going to be challenging as he does not have insurance.   Fever/leukocytosis: Unclear source. Now resolved.  Chest x-ray did not show any pneumonia.  COVID-negative.  MRI of the spine on 9/18 was concerning for infection/discitis at L4 level.  Disc Aspiration was completed on 9/25 by IR.  He started on IV vancomycin, ceftriaxone.  Culture from spinal aspiration negative.  Patient had ongoing pain on the right shoulder, MRI was done which was suggestive of septic arthritis.  Orthopedics aspirated the shoulder on 9/27 with no fluid.  IR aspirated right shoulder on 9/29 without finding of any infection.  ID was also consulted, antibiotics discontinued.  Currently off antibiotics.    Right shoulder pain: Persistent.  Continue local anesthetics, oral pain medications.  Consideration of steroid injection in the shoulder as an outpatient.  Continue pain management.   Abdominal discomfort/microcytic  iron deficiency anemia: Continue iron supplementation.  Iron studies showed low iron as per 9/29.  No evidence of active bleeding.  Unaware about any hematochezia or melena. Transfused with a unit of PRBC on 9/28.  Continue monitoring,Hb in the range of 7-8.  Also he was complaining of abdominal pain so abdominal CT was obtained on 06/17/2022 which did not show any acute findings, distal CBD was slightly dilated but no stones or obstruction or lesion.  He was again complaining of left lower quad abdominal pain and had tenderness and mention of not passing gas so abdominal x-ray was obtained which did not show any SBO or ileus.  He is more comfortable today.  Very minimal left lower quadrant tenderness.   Acute urinary retention/AKI:  Felt to be secondary to back pain, mobility.  Started on Flomax.  Renal function is stabilized.  Failed voiding trial twice so Foley in place. we recommend follow-up with urology as an outpatient.      Constipation: Continue bowel regimen   Vitamin D deficiency: Continue supplementation   Hyponatremia :SIADH panel mostly negative.  Stable between 130-133 now  Hypomagnesemia: Resolved.  Recheck in the morning.   Diabetes type 2: Recent A1c of 7.4.  Blood sugar controlled.   Hypertension: Blood pressure well controlled   Hyperlipidemia: Continue Lipitor   Mild cognitive impairment: Continue donepezil   Sinus tachycardia: Most likely secondary to pain.  It is intermittent.  Continue to monitor   Disposition: Waiting for SNF.  Patient carries no insurance.  Will be difficult to place per TOC.  DVT prophylaxis: SCDs Start: 05/26/22 2254   Code Status: Full Code  Family Communication:  None present at bedside.  Plan of care discussed with patient in length and he/she verbalized understanding and agreed with it.  Status is: Inpatient Remains inpatient appropriate because: Medically stable, needs SNF placement.   Estimated body mass index is 26.57 kg/m as calculated from the following:   Height as of this encounter: 5' (1.524 m).   Weight as of this encounter: 61.7 kg.    Nutritional Assessment: Body mass index is 26.57 kg/m.Marland Kitchen Seen by dietician.  I agree with the assessment and plan as outlined below: Nutrition Status: Nutrition Problem: Increased nutrient needs Etiology: post-op healing Signs/Symptoms: other (comment) (Compression fracture) Interventions: Glucerna shake  . Skin Assessment: I have examined the patient's skin and I agree with the wound assessment as performed by the wound care RN as outlined below:    Consultants:  IR Infectious disease Procedures:  As above  Antimicrobials:  Anti-infectives (From admission, onward)    Start     Dose/Rate Route Frequency Ordered Stop   06/18/22 0839  ceFAZolin (ANCEF) IVPB 2g/100 mL premix        over 30 Minutes Intravenous Continuous PRN 06/18/22 0852 06/18/22 0839   06/18/22 0500  ceFAZolin (ANCEF) IVPB  2g/100 mL premix        2 g 200 mL/hr over 30 Minutes Intravenous To Short Stay 06/17/22 1338 06/19/22 0500   06/03/22 0700  vancomycin (VANCOREADY) IVPB 750 mg/150 mL  Status:  Discontinued        750 mg 150 mL/hr over 60 Minutes Intravenous Every 12 hours 06/02/22 1848 06/06/22 1429   06/02/22 1945  vancomycin (VANCOCIN) IVPB 1000 mg/200 mL premix        1,000 mg 200 mL/hr over 60 Minutes Intravenous  Once 06/02/22 1847 06/02/22 2109   06/02/22 1930  cefTRIAXone (ROCEPHIN) 2 g in sodium chloride 0.9 % 100 mL IVPB  Status:  Discontinued        2 g 200 mL/hr over 30 Minutes Intravenous Every 24 hours 06/02/22 1835 06/06/22 1429   06/02/22 0000  ceFAZolin (ANCEF) IVPB 2g/100 mL premix  Status:  Discontinued        2 g 200 mL/hr over 30 Minutes Intravenous To Radiology 05/30/22 1504 06/02/22 1835         Subjective:  Seen and examined.  No complaints today.  Objective: Vitals:   06/20/22 2030 06/21/22 0504 06/21/22 0812 06/21/22 0959  BP: 105/75 116/71 107/71 118/72  Pulse: 96 93 92 80  Resp: 18 16 20    Temp: 98.7 F (37.1 C) 99 F (37.2 C) 99.2 F (37.3 C)   TempSrc:  Axillary   SpO2: 92% 90% 94%   Weight:      Height:        Intake/Output Summary (Last 24 hours) at 06/21/2022 1008 Last data filed at 06/21/2022 0500 Gross per 24 hour  Intake --  Output 1450 ml  Net -1450 ml    Filed Weights   06/04/22 0315 06/06/22 0500 06/07/22 0500  Weight: 60 kg 62.8 kg 61.7 kg    Examination:  General exam: Appears calm and comfortable  Respiratory system: Clear to auscultation. Respiratory effort normal. Cardiovascular system: S1 & S2 heard, RRR. No JVD, murmurs, rubs, gallops or clicks. No pedal edema. Gastrointestinal system: Abdomen is nondistended, soft and mild left lower quadrant tenderness. No organomegaly or masses felt. Normal bowel sounds heard. Central nervous system: Alert and oriented. No focal neurological deficits. Extremities: Symmetric 5 x 5  power. Skin: No rashes, lesions or ulcers.  Psychiatry: Judgement and insight appear normal. Mood & affect appropriate.    Data Reviewed: I have personally reviewed following labs and imaging studies  CBC: Recent Labs  Lab 06/15/22 0319 06/16/22 0801 06/17/22 0117 06/18/22 0012  WBC 8.6 11.3* 8.9 8.5  HGB 7.5* 8.9* 7.9* 8.1*  HCT 22.3* 26.8* 23.7* 25.5*  MCV 71.2* 70.5* 69.7* 72.4*  PLT 733* 864* 889* 722*    Basic Metabolic Panel: Recent Labs  Lab 06/15/22 0319 06/16/22 0801 06/17/22 0117 06/18/22 0012 06/19/22 1350 06/20/22 0450  NA 130* 133* 133* 131*  --  131*  K 3.5 3.3* 3.1* 3.7  --  3.5  CL 94* 92* 92* 93*  --  95*  CO2 29 28 30 30   --  30  GLUCOSE 80 83 105* 223*  --  87  BUN 5* <5* 5* 5*  --  6  CREATININE 0.76 0.88 0.77 0.84  --  0.90  CALCIUM 9.5 10.3 9.7 9.5  --  9.3  MG  --   --  1.6*  --  1.5*  --     GFR: Estimated Creatinine Clearance: 70.1 mL/min (by C-G formula based on SCr of 0.9 mg/dL). Liver Function Tests: Recent Labs  Lab 06/17/22 1504 06/18/22 0012  AST 21 20  ALT 8 8  ALKPHOS 94 91  BILITOT 0.8 0.4  PROT 7.3 6.2*  ALBUMIN 1.7* <1.5*    No results for input(s): "LIPASE", "AMYLASE" in the last 168 hours. No results for input(s): "AMMONIA" in the last 168 hours. Coagulation Profile: Recent Labs  Lab 06/18/22 0012  INR 1.2    Cardiac Enzymes: No results for input(s): "CKTOTAL", "CKMB", "CKMBINDEX", "TROPONINI" in the last 168 hours. BNP (last 3 results) No results for input(s): "PROBNP" in the last 8760 hours. HbA1C: No results for input(s): "HGBA1C" in the last 72 hours. CBG: Recent Labs  Lab 06/20/22 0747 06/20/22 1142 06/20/22 1620 06/20/22 2031 06/21/22 0811  GLUCAP 91 136* 125* 113* 80    Lipid Profile: No results for input(s): "CHOL", "HDL", "LDLCALC", "TRIG", "CHOLHDL", "LDLDIRECT" in the last 72 hours. Thyroid Function Tests: No results for input(s): "TSH", "T4TOTAL", "FREET4", "T3FREE", "THYROIDAB" in  the last 72 hours. Anemia Panel: No results for input(s): "VITAMINB12", "FOLATE", "FERRITIN", "TIBC", "IRON", "RETICCTPCT" in the last 72 hours. Sepsis Labs: No results for input(s): "PROCALCITON", "LATICACIDVEN" in the last 168 hours.  No results found for this or any previous visit (from the past 240 hour(s)).   Radiology Studies: DG Abd 1 View  Result Date: 06/20/2022 CLINICAL DATA:  Abdominal pain. EXAM: ABDOMEN - 1 VIEW COMPARISON:  06/02/2020, CT of the abdomen and pelvis on 06/17/2022 FINDINGS: Bowel-gas pattern is nonobstructive. No free intraperitoneal air. No evidence for organomegaly. Moderate degenerative changes in the thoracolumbar spine. Radiopaque object overlies the epigastric region, consistent with object external to the patient. IMPRESSION: Nonobstructive bowel-gas pattern. Electronically Signed   By: Nolon Nations M.D.   On: 06/20/2022 12:27    Scheduled Meds:  atorvastatin  40 mg Oral Daily   Chlorhexidine Gluconate Cloth  6 each Topical Daily   cholecalciferol  1,000 Units Oral Daily   donepezil  5 mg Oral QHS   feeding supplement (GLUCERNA SHAKE)  237 mL Oral TID BM   ferrous sulfate  325 mg Oral Q breakfast   gabapentin  200 mg Oral TID   insulin aspart  0-9 Units Subcutaneous TID WC   lidocaine  1 patch Transdermal Q24H   lidocaine  1 patch Transdermal Q24H   magnesium gluconate  500 mg Oral BID   metoprolol succinate  12.5 mg Oral Daily   morphine  15 mg Oral Q12H   pantoprazole  40 mg Oral Daily   polyethylene glycol  17 g Oral BID   senna-docusate  1 tablet Oral BID   tamsulosin  0.4 mg Oral Daily   Continuous Infusions:     LOS: 26 days   Darliss Cheney, MD [facility]  06/21/2022, 10:08 AM   *Please note that this is a verbal dictation therefore any spelling or grammatical errors are due to the "[place] [employer]" system interpretation.  Please page via [place] and do not message via secure chat for urgent patient care matters.  Secure chat can be used for non urgent patient care matters.  How to contact the [facility] Attending or Consulting provider [place] or covering provider during after hours [place], for this patient?  Check the care team in [facility] and look for a) attending/consulting [facility] provider listed and b) the [facility] team listed. Page or secure chat [phone]. Log into [email or web address] and use [place]'s universal password to access. If you do not have the password, please contact the hospital operator. Locate the [facility] provider you are looking for under [facility] and page to a number that you can be directly reached. If you still have difficulty reaching the provider, please page the [facility] (Director on Call) for the Hospitalists listed on amion for assistance.

## 2022-06-22 LAB — GLUCOSE, CAPILLARY
Glucose-Capillary: 93 mg/dL (ref 70–99)
Glucose-Capillary: 95 mg/dL (ref 70–99)
Glucose-Capillary: 170 mg/dL — ABNORMAL HIGH (ref 70–99)
Glucose-Capillary: 62 mg/dL — ABNORMAL LOW (ref 70–99)
Glucose-Capillary: 136 mg/dL — ABNORMAL HIGH (ref 70–99)

## 2022-06-22 NOTE — Progress Notes (Signed)
PROGRESS NOTE    Wayne Stokes  ZDG:[id number] DOB: Jan 03, 1966 DOA: 05/26/2022 PCP: [name], NP   Brief Narrative:  Patient is a 56 year old male from [place] who [place], with history of diabetes type 2, hypertension, lumbar disc degenerative disease with spinal stenosis/ claudication, T12 vertebral compression fracture, mild cognitive impairment who was admitted for the management of uncontrolled low back pain, ambulatory difficulty due to severe lumbar spine stenosis and T12 compression fracture.  Orthopedics, neurosurgery evaluated the patient and recommended TLSO brace, pain control and outpatient follow-up.  PT recommending acute inpatient rehab.  Difficult placement due to lack of insurance.LLOS. IR planning for T12 kyphoplasty for persistent back pain.  Assessment & Plan:   Principal Problem:   Pyogenic arthritis of right shoulder region [facility]) Active Problems:   Spinal stenosis of lumbar region with neurogenic claudication   T12 compression fracture (HCC)   AKI (acute kidney injury) ([place])   Hyponatremia   Acute urinary retention   Type 2 diabetes mellitus without complication, without long-term current use of insulin (HCC)   Leukocytosis   Hypertension associated with diabetes ([place])   Iron deficiency anemia   Hyperlipidemia associated with type 2 diabetes mellitus (HCC)   Mild cognitive impairment   Thrush, oral   Right shoulder pain   Discitis of lumbar region   Chronic low back pain with bilateral sciatica  Severe back pain/lumbar spinal stenosis/T12 compression fracture: Presented with severe low back pain, ambulatory dysfunction.  MRI did not show any cord injury.  Neurosurgery consulted, no surgical intervention recommended, recommended TLSO brace.  IR was also consulted for consideration of T12 kyphoplasty but this was canceled due to fever.  Requested for kyphoplasty again because he does not have any signs of sepsis.  Patient eventually underwent  kyphoplasty on 06/18/2022.  Pain is controlled.  Seen by PT OT who recommends SNF but that is going to be challenging as he does not have insurance.   Fever/leukocytosis: Unclear source. Now resolved.  Chest x-ray did not show any pneumonia.  COVID-negative.  MRI of the spine on 9/18 was concerning for infection/discitis at L4 level.  Disc Aspiration was completed on 9/25 by IR.  He started on IV vancomycin, ceftriaxone.  Culture from spinal aspiration negative.  Patient had ongoing pain on the right shoulder, MRI was done which was suggestive of septic arthritis.  Orthopedics aspirated the shoulder on 9/27 with no fluid.  IR aspirated right shoulder on 9/29 without finding of any infection.  ID was also consulted, antibiotics discontinued.  Currently off antibiotics.    Right shoulder pain: Persistent.  Continue local anesthetics, oral pain medications.  Consideration of steroid injection in the shoulder as an outpatient.  Continue pain management.   Abdominal discomfort/microcytic  iron deficiency anemia: Continue iron supplementation.  Iron studies showed low iron as per 9/29.  No evidence of active bleeding.  Unaware about any hematochezia or melena. Transfused with a unit of PRBC on 9/28.  Continue monitoring,Hb in the range of 7-8.  Also he was complaining of abdominal pain so abdominal CT was obtained on 06/17/2022 which did not show any acute findings, distal CBD was slightly dilated but no stones or obstruction or lesion.  He was again complaining of left lower quad abdominal pain and had tenderness and mention of not passing gas so abdominal x-ray was obtained which did not show any SBO or ileus.  He is more comfortable today.  Very minimal left lower quadrant tenderness.   Acute urinary retention/AKI:  Felt to be secondary to back pain, mobility.  Started on Flomax.  Renal function is stabilized.  Failed voiding trial twice so Foley in place. we recommend follow-up with urology as an outpatient.      Constipation: Continue bowel regimen   Vitamin D deficiency: Continue supplementation   Hyponatremia :SIADH panel mostly negative.  Stable between 130-133 now  Hypomagnesemia: Resolved.  Recheck in the morning.   Diabetes type 2: Recent A1c of 7.4.  Blood sugar controlled.   Hypertension: Blood pressure well controlled   Hyperlipidemia: Continue Lipitor   Mild cognitive impairment: Continue donepezil   Sinus tachycardia: Most likely secondary to pain.  It is intermittent but improving.  Continue to monitor   Disposition: Waiting for SNF.  Patient carries no insurance.  Will be difficult to place per TOC.  DVT prophylaxis: SCDs Start: 05/26/22 2254   Code Status: Full Code  Family Communication:  None present at bedside.  Plan of care discussed with patient in length and he/she verbalized understanding and agreed with it.  Status is: Inpatient Remains inpatient appropriate because: Medically stable, needs SNF placement.   Estimated body mass index is 26.57 kg/m as calculated from the following:   Height as of this encounter: 5' (1.524 m).   Weight as of this encounter: 61.7 kg.    Nutritional Assessment: Body mass index is 26.57 kg/m.Marland Kitchen Seen by dietician.  I agree with the assessment and plan as outlined below: Nutrition Status: Nutrition Problem: Increased nutrient needs Etiology: post-op healing Signs/Symptoms: other (comment) (Compression fracture) Interventions: Glucerna shake  . Skin Assessment: I have examined the patient's skin and I agree with the wound assessment as performed by the wound care RN as outlined below:    Consultants:  IR Infectious disease Procedures:  As above  Antimicrobials:  Anti-infectives (From admission, onward)    Start     Dose/Rate Route Frequency Ordered Stop   06/18/22 0839  ceFAZolin (ANCEF) IVPB 2g/100 mL premix        over 30 Minutes Intravenous Continuous PRN 06/18/22 0852 06/18/22 0839   06/18/22 0500  ceFAZolin  (ANCEF) IVPB 2g/100 mL premix        2 g 200 mL/hr over 30 Minutes Intravenous To Short Stay 06/17/22 1338 06/19/22 0500   06/03/22 0700  vancomycin (VANCOREADY) IVPB 750 mg/150 mL  Status:  Discontinued        750 mg 150 mL/hr over 60 Minutes Intravenous Every 12 hours 06/02/22 1848 06/06/22 1429   06/02/22 1945  vancomycin (VANCOCIN) IVPB 1000 mg/200 mL premix        1,000 mg 200 mL/hr over 60 Minutes Intravenous  Once 06/02/22 1847 06/02/22 2109   06/02/22 1930  cefTRIAXone (ROCEPHIN) 2 g in sodium chloride 0.9 % 100 mL IVPB  Status:  Discontinued        2 g 200 mL/hr over 30 Minutes Intravenous Every 24 hours 06/02/22 1835 06/06/22 1429   06/02/22 0000  ceFAZolin (ANCEF) IVPB 2g/100 mL premix  Status:  Discontinued        2 g 200 mL/hr over 30 Minutes Intravenous To Radiology 05/30/22 1504 06/02/22 1835         Subjective:  Seen and examined.  No complaints.  Objective: Vitals:   06/21/22 1810 06/21/22 2019 06/22/22 0424 06/22/22 0808  BP:  105/80 105/70 128/78  Pulse:  (!) 102 93 91  Resp:  14 18 15   Temp: 99.5 F (37.5 C) 98.6 F (37 C) 98 F (36.7 C)  TempSrc: Oral     SpO2:  (!) 88% 90% 95%  Weight:      Height:       No intake or output data in the 24 hours ending 06/22/22 1018  Filed Weights   06/04/22 0315 06/06/22 0500 06/07/22 0500  Weight: 60 kg 62.8 kg 61.7 kg    Examination:  General exam: Appears calm and comfortable  Respiratory system: Clear to auscultation. Respiratory effort normal. Cardiovascular system: S1 & S2 heard, RRR. No JVD, murmurs, rubs, gallops or clicks. No pedal edema. Gastrointestinal system: Abdomen is nondistended, soft and very mild left lower quadrant tenderness, no organomegaly or masses felt. Normal bowel sounds heard. Central nervous system: Alert and oriented. No focal neurological deficits. Extremities: Symmetric 5 x 5 power. Skin: No rashes, lesions or ulcers.  Psychiatry: Judgement and insight appear normal. Mood &  affect appropriate.   Data Reviewed: I have personally reviewed following labs and imaging studies  CBC: Recent Labs  Lab 06/16/22 0801 06/17/22 0117 06/18/22 0012  WBC 11.3* 8.9 8.5  HGB 8.9* 7.9* 8.1*  HCT 26.8* 23.7* 25.5*  MCV 70.5* 69.7* 72.4*  PLT 864* 889* 722*    Basic Metabolic Panel: Recent Labs  Lab 06/16/22 0801 06/17/22 0117 06/18/22 0012 06/19/22 1350 06/20/22 0450  NA 133* 133* 131*  --  131*  K 3.3* 3.1* 3.7  --  3.5  CL 92* 92* 93*  --  95*  CO2 28 30 30   --  30  GLUCOSE 83 105* 223*  --  87  BUN <5* 5* 5*  --  6  CREATININE 0.88 0.77 0.84  --  0.90  CALCIUM 10.3 9.7 9.5  --  9.3  MG  --  1.6*  --  1.5*  --     GFR: Estimated Creatinine Clearance: 70.1 mL/min (by C-G formula based on SCr of 0.9 mg/dL). Liver Function Tests: Recent Labs  Lab 06/17/22 1504 06/18/22 0012  AST 21 20  ALT 8 8  ALKPHOS 94 91  BILITOT 0.8 0.4  PROT 7.3 6.2*  ALBUMIN 1.7* <1.5*    No results for input(s): "LIPASE", "AMYLASE" in the last 168 hours. No results for input(s): "AMMONIA" in the last 168 hours. Coagulation Profile: Recent Labs  Lab 06/18/22 0012  INR 1.2    Cardiac Enzymes: No results for input(s): "CKTOTAL", "CKMB", "CKMBINDEX", "TROPONINI" in the last 168 hours. BNP (last 3 results) No results for input(s): "PROBNP" in the last 8760 hours. HbA1C: No results for input(s): "HGBA1C" in the last 72 hours. CBG: Recent Labs  Lab 06/21/22 0811 06/21/22 1155 06/21/22 1739 06/21/22 2019 06/22/22 0822  GLUCAP 80 119* 177* 93 93    Lipid Profile: No results for input(s): "CHOL", "HDL", "LDLCALC", "TRIG", "CHOLHDL", "LDLDIRECT" in the last 72 hours. Thyroid Function Tests: No results for input(s): "TSH", "T4TOTAL", "FREET4", "T3FREE", "THYROIDAB" in the last 72 hours. Anemia Panel: No results for input(s): "VITAMINB12", "FOLATE", "FERRITIN", "TIBC", "IRON", "RETICCTPCT" in the last 72 hours. Sepsis Labs: No results for input(s):  "PROCALCITON", "LATICACIDVEN" in the last 168 hours.  No results found for this or any previous visit (from the past 240 hour(s)).   Radiology Studies: DG Abd 1 View  Result Date: 06/20/2022 CLINICAL DATA:  Abdominal pain. EXAM: ABDOMEN - 1 VIEW COMPARISON:  06/02/2020, CT of the abdomen and pelvis on 06/17/2022 FINDINGS: Bowel-gas pattern is nonobstructive. No free intraperitoneal air. No evidence for organomegaly. Moderate degenerative changes in the thoracolumbar spine. Radiopaque object overlies the epigastric region,  consistent with object external to the patient. IMPRESSION: Nonobstructive bowel-gas pattern. Electronically Signed   By: Norva Pavlov M.D.   On: 06/20/2022 12:27    Scheduled Meds:  atorvastatin  40 mg Oral Daily   Chlorhexidine Gluconate Cloth  6 each Topical Daily   cholecalciferol  1,000 Units Oral Daily   donepezil  5 mg Oral QHS   feeding supplement (GLUCERNA SHAKE)  237 mL Oral TID BM   ferrous sulfate  325 mg Oral Q breakfast   gabapentin  200 mg Oral TID   insulin aspart  0-9 Units Subcutaneous TID WC   lidocaine  1 patch Transdermal Q24H   lidocaine  1 patch Transdermal Q24H   magnesium gluconate  500 mg Oral BID   metoprolol succinate  12.5 mg Oral Daily   morphine  15 mg Oral Q12H   nystatin  5 mL Oral QID   pantoprazole  40 mg Oral Daily   polyethylene glycol  17 g Oral BID   senna-docusate  1 tablet Oral BID   tamsulosin  0.4 mg Oral Daily   Continuous Infusions:     LOS: 27 days   Hughie Closs, MD [facility]  06/22/2022, 10:18 AM   *Please note that this is a verbal dictation therefore any spelling or grammatical errors are due to the "Dragon [employer] One" system interpretation.  Please page via Amion and do not message via secure chat for urgent patient care matters. Secure chat can be used for non urgent patient care matters.  How to contact the [facility] Attending or Consulting provider 7A - 7P or covering provider during after  hours 7P -7A, for this patient?  Check the care team in [facility] and look for a) attending/consulting TRH provider listed and b) the [facility] team listed. Page or secure chat 7A-7P. Log into [email or web address] and use [place] universal password to access. If you do not have the password, please contact the hospital operator. Locate the [facility] provider you are looking for under [facility] and page to a number that you can be directly reached. If you still have difficulty reaching the provider, please page the [facility] (Director on Call) for the Hospitalists listed on amion for assistance.

## 2022-06-23 LAB — GLUCOSE, CAPILLARY
Glucose-Capillary: 94 mg/dL (ref 70–99)
Glucose-Capillary: 97 mg/dL (ref 70–99)
Glucose-Capillary: 135 mg/dL — ABNORMAL HIGH (ref 70–99)
Glucose-Capillary: 123 mg/dL — ABNORMAL HIGH (ref 70–99)
Glucose-Capillary: 117 mg/dL — ABNORMAL HIGH (ref 70–99)

## 2022-06-23 MED ORDER — MAGIC MOUTHWASH W/LIDOCAINE
10.0000 mL | Freq: Once | ORAL | Status: AC
  Administered 2022-06-24: 10 mL via ORAL
  Filled 2022-06-23: qty 10

## 2022-06-23 MED ORDER — INSULIN ASPART 100 UNIT/ML IJ SOLN: 0.0000 [IU] | Freq: Every day | INTRAMUSCULAR | Status: DC

## 2022-06-23 MED ORDER — ONDANSETRON HCL 4 MG/2ML IJ SOLN
4.0000 mg | Freq: Once | INTRAMUSCULAR | Status: AC
  Administered 2022-06-24: 4 mg via INTRAVENOUS
  Filled 2022-06-23: qty 2

## 2022-06-23 MED ORDER — INSULIN ASPART 100 UNIT/ML IJ SOLN: 0.0000 [IU] | Freq: Three times a day (TID) | INTRAMUSCULAR | Status: DC

## 2022-06-23 NOTE — Progress Notes (Signed)
Occupational Therapy Treatment Patient Details Name: Wayne Stokes MRN: [id number] DOB: 11/12/1965 Today's Date: 06/23/2022   History of present illness 56 y.o. male presented 05/26/22 with c/o severe and progressively worsening low back pain that radiates into his left buttock and down his LLE. Imaging +acute T12 compression fracture and  L4/5 there is multifactorial severe spinal stenosis and severe bilateral foraminal stenosis. Neurosurgery consult with no acute surgical intervention needed. 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   OT comments  Pt progressing towards established OT goals. Pt donning brace with Min A for sequencing, positioning, and clip management; having pt perform clips management several times for practice. Pt performing grooming at sink with Min A and peri care with Max A. Pt requiring Min-Mod A for functional mobility with RW. Continue to recommend dc to AIR and will continue to follow acutely as admitted.    Recommendations for follow up therapy are one component of a multi-disciplinary discharge planning process, led by the attending physician.  Recommendations may be updated based on patient status, additional functional criteria and insurance authorization.    Follow Up Recommendations  Acute inpatient rehab (3hours/day)    Assistance Recommended at Discharge Frequent or constant Supervision/Assistance  Patient can return home with the following  A lot of help with walking and/or transfers;A lot of help with bathing/dressing/bathroom   Equipment Recommendations  BSC/3in1    Recommendations for Other Services      Precautions / Restrictions Precautions Precautions: Fall;Back Required Braces or Orthoses:  Spinal Brace Spinal Brace: Thoracolumbosacral orthotic;Applied in sitting position Restrictions Weight Bearing Restrictions: No       Mobility Bed Mobility Overal bed mobility: Needs Assistance Bed Mobility: Rolling, Sidelying to Sit Rolling: Min assist Sidelying to sit: Min assist       General bed mobility comments: Min A for log roll    Transfers Overall transfer level: Needs assistance Equipment used: Rolling walker (2 wheels) Transfers: Sit to/from Stand Sit to Stand: Mod assist, Min assist           General transfer comment: Mod assist to rise and steady from the bed; Better sit to stand when standing from [facility] and using armrests; performed 5 reps of sit to stand trhrought session, standing from bed and also standing from [facility] multiple times to sink     Balance Overall balance assessment: Needs assistance, History of Falls Sitting-balance support: No upper extremity supported, Feet supported Sitting balance-Leahy Scale: Fair     Standing balance support: Bilateral upper extremity supported, During functional activity Standing balance-Leahy Scale: Poor                             ADL either performed or assessed with clinical judgement   ADL Overall ADL's : Needs assistance/impaired     Grooming: Oral care;Minimal assistance;Standing Grooming Details (indicate cue type and reason): Pt performing oral care at sink. Sitting to manage tooth brush and tooth paste and then performing sit<>Stand to spit and rinse. Min A for balance in standing         Upper Body Dressing : Minimal assistance;Sitting;Cueing for compensatory techniques;Cueing for sequencing Upper Body Dressing Details (indicate cue type and reason): Continues to require Min A for donning brace management.  Having patient repeat reach step several times to practice     Toilet Transfer: Minimal assistance;+2 for physical assistance;Ambulation;BSC/3in1;Rolling walker (2 wheels);Moderate  assistance   Toileting- Clothing Manipulation and Hygiene: Maximal assistance       Functional mobility during ADLs: Minimal assistance;+2 for safety/equipment;Rolling walker (2 wheels) General ADL Comments: Pt performing grooming at sink and mobility in hallway    Extremity/Trunk Assessment Upper Extremity Assessment Upper Extremity Assessment: RUE deficits/detail;LUE deficits/detail RUE Deficits / Details: right shoulder pain. Decreased grasp strength and finger dexerity RUE Coordination: decreased fine motor;decreased gross motor LUE Deficits / Details: increased shoulder pain. Decreased grasp strength and finger dexerity LUE Coordination: decreased fine motor;decreased gross motor   Lower Extremity Assessment Lower Extremity Assessment: Defer to PT evaluation        Vision       Perception     Praxis      Cognition Arousal/Alertness: Awake/alert Behavior During Therapy: WFL for tasks assessed/performed Overall Cognitive Status: Impaired/Different from baseline Area of Impairment: Problem solving                               General Comments: Increased time for following commands. Internally distracted by pain; Lots of encouragement to participate        Exercises      Shoulder Instructions       General Comments Keo, Medical Interpreter, present for clearer communication    Pertinent Vitals/ Pain       Pain Assessment Pain Assessment: Faces Faces Pain Scale: Hurts even more Pain Location: Back Pain Descriptors / Indicators: Discomfort, Grimacing, Guarding, Moaning Pain Intervention(s): Monitored during session, Limited activity within patient's tolerance, Repositioned  Home Living                                          Prior Functioning/Environment              Frequency  Min 2X/week        Progress Toward Goals  OT Goals(current goals can now be found in the care plan section)  Progress towards OT  goals: Progressing toward goals  Acute Rehab OT Goals OT Goal Formulation: With patient Time For Goal Achievement: 06/26/22 Potential to Achieve Goals: Good ADL Goals Pt Will Perform Grooming: with min guard assist;standing Pt Will Perform Lower Body Dressing: sit to/from stand;with adaptive equipment;with min guard assist Pt Will Transfer to Toilet: with min guard assist;ambulating;bedside commode Additional ADL Goal #1: Pt will independently demonstrate 3/3 back precautions during ADLs Additional ADL Goal #2: Pt will independent don/doff brace in preparation for ADLs  Plan Discharge plan remains appropriate    Co-evaluation    PT/OT/SLP Co-Evaluation/Treatment: Yes Reason for Co-Treatment: For patient/therapist safety;To address functional/ADL transfers   OT goals addressed during session: ADL's and self-care      AM-PAC OT "6 Clicks" Daily Activity     Outcome Measure   Help from another person eating meals?: A Little Help from another person taking care of personal grooming?: A Little Help from another person toileting, which includes using toliet, bedpan, or urinal?: A Lot Help from another person bathing (including washing, rinsing, drying)?: A Lot Help from another person to put on and taking off regular upper body clothing?: A Lot Help from another person to put on and taking off regular lower body clothing?: A  Lot 6 Click Score: 14    End of Session Equipment Utilized During Treatment: Back brace;Rolling walker (2 wheels)  OT Visit Diagnosis: Other abnormalities of gait and mobility (R26.89);Muscle weakness (generalized) (M62.81);Pain Pain - Right/Left: Right   Activity Tolerance Patient tolerated treatment well   Patient Left in chair;with call bell/phone within reach;with chair alarm set   Nurse Communication Mobility status        Time: [id number] OT Time Calculation (min): 41 min  Charges: OT General Charges $OT Visit: 1 Visit OT Treatments $Self  Care/Home Management : 23-37 mins  Kern Gingras MSOT, OTR/L Acute Rehab Office: [place] 06/23/2022, 6:09 PM

## 2022-06-23 NOTE — Inpatient Diabetes Management (Signed)
Inpatient Diabetes Program Recommendations  AACE/ADA: New Consensus Statement on Inpatient Glycemic Control (2015)  Target Ranges:  Prepandial:   less than 140 mg/dL      Peak postprandial:   less than 180 mg/dL (1-2 hours)      Critically ill patients:  140 - 180 mg/dL   Lab Results  Component Value Date   GLUCAP 97 06/23/2022   HGBA1C 7.4 (A) 03/17/2022    Review of Glycemic Control  Latest Reference Range & Units 06/22/22 20:16 06/22/22 21:30 06/23/22 05:23 06/23/22 07:49  Glucose-Capillary 70 - 99 mg/dL 62 (L) 136 (H) 94 97  (L): Data is abnormally low (H): Data is abnormally high Diabetes history: Type 2 DM Outpatient Diabetes medications: Glipizide 5 mg BID, Metformin 1000 mg BID Current orders for Inpatient glycemic control: Novolog 0-9 units TID  Inpatient Diabetes Program Recommendations:    Noted hypoglycemia of 62 mg/dL following correction. Of note correction was given >1 hour from previous CBG.   Consider decreasing correction to Novolog 0-6 units TID.   Thanks, Bronson Curb, MSN, RNC-OB Diabetes Coordinator [phone] (8a-5p)

## 2022-06-23 NOTE — TOC Progression Note (Signed)
Transition of Care [facility]) - Progression Note    Patient Details  Name: Wayne Stokes MRN: [id number] Date of Birth: [birth year]  Transition of Care [facility]) CM/SW Contact  Joanne Chars, LCSW Phone Number: 06/23/2022, 12:35 PM  Clinical Narrative:   [facility] cannot offer bed, per Nicole Kindred.     Expected Discharge Plan: [place] Barriers to Discharge: Continued Medical Work up  Expected Discharge Plan and Services Expected Discharge Plan: [place] In-house Referral: Development worker, community Discharge Planning Services: CM Consult                                           Social Determinants of Health (SDOH) Interventions    Readmission Risk Interventions     No data to display

## 2022-06-23 NOTE — Progress Notes (Signed)
Physical Therapy Treatment Patient Details Name: Wayne Stokes MRN: [id number] DOB: 23-May-1966 Today's Date: 06/23/2022   History of Present Illness 56 y.o. male presented 05/26/22 with c/o severe and progressively worsening low back pain that radiates into his left buttock and down his LLE. Imaging +acute T12 compression fracture and  L4/5 there is multifactorial severe spinal stenosis and severe bilateral foraminal stenosis. Neurosurgery consult with no acute surgical intervention needed. 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    PT Comments    Continuing work on functional mobility and activity tolerance;  Notable progress since kyphoplasty, and able to walk in the hallway with min/minguard assist and recliner to follow; still quite painful, but able to do much more, mobility-wise; pt is getting close to meeting PT goals; will update goals within the next few PT sessions;   An in-person interpreter is helpful - near essential for clarity during rehab therapy sessions; Have arranged with the Office of Inclusion for consistent interpreter presence M-F from 10-11   Recommendations for follow up therapy are one component of a multi-disciplinary discharge planning process, led by the attending physician.  Recommendations may be updated based on patient status, additional functional criteria and insurance authorization.  Follow Up Recommendations  Acute inpatient rehab (3hours/day) (though likely a longshot) Can patient physically be transported by private vehicle: No   Assistance Recommended at Discharge Frequent or constant Supervision/Assistance  Patient can return home with the following A lot of help with  bathing/dressing/bathroom;Assistance with cooking/housework;Assist for transportation;Help with stairs or ramp for entrance;Two people to help with walking and/or transfers;A little help with walking and/or transfers   Equipment Recommendations  BSC/3in1;Rolling walker (2 wheels)    Recommendations for Other Services       Precautions / Restrictions Precautions Precautions: Fall;Back Required Braces or Orthoses: Spinal Brace Spinal Brace: Thoracolumbosacral orthotic;Applied in sitting position     Mobility  Bed Mobility                    Transfers Overall transfer level: Needs assistance Equipment used: Rolling walker (2 wheels) Transfers: Sit to/from Stand Sit to Stand: Mod assist, Min assist           General transfer comment: Mod assist to rise and steady from the bed; Better sit to stand when standing from [facility] and using armrests; performed 5 reps of sit to stand trhrought session, standing from bed and also standing from [facility] multiple times to sink    Ambulation/Gait Ambulation/Gait assistance: Min assist, +2 safety/equipment Gait Distance (Feet): 30 Feet Assistive device: Rolling walker (2 wheels) (and chair follow at a slight distance) Gait Pattern/deviations: Trunk flexed, Knee flexed in stance - left, Knee flexed in stance - right, Step-to pattern, Step-through pattern Gait velocity: decreased     General Gait Details: Walked from sink in bathroom into hallway, across hallway to wall; reporting L knee and low back pain throughout. Assist for balacne, and lots of encouragement given   Stairs             Wheelchair Mobility    Modified Rankin (Stroke Patients Only)       Balance     Sitting balance-Leahy Scale: Fair       Standing balance-Leahy Scale: Poor  Cognition Arousal/Alertness: Awake/alert Behavior During Therapy: WFL for tasks assessed/performed Overall Cognitive Status:  Impaired/Different from baseline Area of Impairment: Problem solving                               General Comments: Increased time for following commands. Internally distracted by pain; Lots of encouragement to participate        Exercises      General Comments General comments (skin integrity, edema, etc.): Wayne Stokes, Medical Interpreter, present for clearer communication      Pertinent Vitals/Pain Pain Assessment Pain Assessment: Faces Faces Pain Scale: Hurts even more Pain Location: Back Pain Descriptors / Indicators: Discomfort, Grimacing, Guarding, Moaning Pain Intervention(s): Monitored during session, Repositioned    Home Living                          Prior Function            PT Goals (current goals can now be found in the care plan section) Acute Rehab PT Goals Patient Stated Goal: Indicated he wanted brush his teeth during session PT Goal Formulation: With patient Time For Goal Achievement: 06/25/22 Potential to Achieve Goals: Fair Progress towards PT goals: Progressing toward goals (slowly, but able to walk with assist now; consider goal update soon)    Frequency    Min 4X/week      PT Plan Current plan remains appropriate    Co-evaluation PT/OT/SLP Co-Evaluation/Treatment: Yes            AM-PAC PT "6 Clicks" Mobility   Outcome Measure  Help needed turning from your back to your side while in a flat bed without using bedrails?: A Little Help needed moving from lying on your back to sitting on the side of a flat bed without using bedrails?: A Little Help needed moving to and from a bed to a chair (including a wheelchair)?: A Little Help needed standing up from a chair using your arms (e.g., wheelchair or bedside chair)?: A Little Help needed to walk in hospital room?: Total Help needed climbing 3-5 steps with a railing? : Total 6 Click Score: 14    End of Session Equipment Utilized During Treatment: Back brace Activity  Tolerance: Patient tolerated treatment well (but still able to walk) Patient left: in chair;with call bell/phone within reach;with chair alarm set Nurse Communication: Mobility status PT Visit Diagnosis: Other abnormalities of gait and mobility (R26.89);Muscle weakness (generalized) (M62.81)     Time: 4970-2637 PT Time Calculation (min) (ACUTE ONLY): 30 min  Charges:  $Gait Training: 8-22 mins                     Van Clines, PT  Acute Rehabilitation Services Office [phone]    Levi Aland 06/23/2022, 5:40 PM

## 2022-06-23 NOTE — Progress Notes (Signed)
PROGRESS NOTE    Wayne Stokes  ZDG:[id number] DOB: Jan 03, 1966 DOA: 05/26/2022 PCP: [name], NP   Brief Narrative:  Patient is a 56 year old male from [place] who [place], with history of diabetes type 2, hypertension, lumbar disc degenerative disease with spinal stenosis/ claudication, T12 vertebral compression fracture, mild cognitive impairment who was admitted for the management of uncontrolled low back pain, ambulatory difficulty due to severe lumbar spine stenosis and T12 compression fracture.  Orthopedics, neurosurgery evaluated the patient and recommended TLSO brace, pain control and outpatient follow-up.  PT recommending acute inpatient rehab.  Difficult placement due to lack of insurance.LLOS. IR planning for T12 kyphoplasty for persistent back pain.  Assessment & Plan:   Principal Problem:   Pyogenic arthritis of right shoulder region [facility]) Active Problems:   Spinal stenosis of lumbar region with neurogenic claudication   T12 compression fracture (HCC)   AKI (acute kidney injury) ([place])   Hyponatremia   Acute urinary retention   Type 2 diabetes mellitus without complication, without long-term current use of insulin (HCC)   Leukocytosis   Hypertension associated with diabetes ([place])   Iron deficiency anemia   Hyperlipidemia associated with type 2 diabetes mellitus (HCC)   Mild cognitive impairment   Thrush, oral   Right shoulder pain   Discitis of lumbar region   Chronic low back pain with bilateral sciatica  Severe back pain/lumbar spinal stenosis/T12 compression fracture: Presented with severe low back pain, ambulatory dysfunction.  MRI did not show any cord injury.  Neurosurgery consulted, no surgical intervention recommended, recommended TLSO brace.  IR was also consulted for consideration of T12 kyphoplasty but this was canceled due to fever.  Requested for kyphoplasty again because he does not have any signs of sepsis.  Patient eventually underwent  kyphoplasty on 06/18/2022.  Pain is controlled.  Seen by PT OT who recommends SNF but that is going to be challenging as he does not have insurance.   Fever/leukocytosis: Unclear source. Now resolved.  Chest x-ray did not show any pneumonia.  COVID-negative.  MRI of the spine on 9/18 was concerning for infection/discitis at L4 level.  Disc Aspiration was completed on 9/25 by IR.  He started on IV vancomycin, ceftriaxone.  Culture from spinal aspiration negative.  Patient had ongoing pain on the right shoulder, MRI was done which was suggestive of septic arthritis.  Orthopedics aspirated the shoulder on 9/27 with no fluid.  IR aspirated right shoulder on 9/29 without finding of any infection.  ID was also consulted, antibiotics discontinued.  Currently off antibiotics.    Right shoulder pain: Persistent.  Continue local anesthetics, oral pain medications.  Consideration of steroid injection in the shoulder as an outpatient.  Continue pain management.   Abdominal discomfort/microcytic  iron deficiency anemia: Continue iron supplementation.  Iron studies showed low iron as per 9/29.  No evidence of active bleeding.  Unaware about any hematochezia or melena. Transfused with a unit of PRBC on 9/28.  Continue monitoring,Hb in the range of 7-8.  Also he was complaining of abdominal pain so abdominal CT was obtained on 06/17/2022 which did not show any acute findings, distal CBD was slightly dilated but no stones or obstruction or lesion.  He was again complaining of left lower quad abdominal pain and had tenderness and mention of not passing gas so abdominal x-ray was obtained which did not show any SBO or ileus.  He is more comfortable today.  Very minimal left lower quadrant tenderness.   Acute urinary retention/AKI:  Felt to be secondary to back pain, mobility.  Started on Flomax.  Renal function is stabilized.  Failed voiding trial twice so Foley in place. we recommend follow-up with urology as an outpatient.      Constipation: Continue bowel regimen   Vitamin D deficiency: Continue supplementation   Hyponatremia :SIADH panel mostly negative.  Stable between 130-133 now  Hypomagnesemia: Resolved.  Recheck in the morning.   Diabetes type 2: Recent A1c of 7.4.  Hyperglycemic.  Will change SSI to sensitive scale.   Hypertension: Blood pressure well controlled   Hyperlipidemia: Continue Lipitor   Mild cognitive impairment: Continue donepezil   Sinus tachycardia: Most likely secondary to pain.  It is intermittent but improving.  Continue to monitor   Disposition: Waiting for SNF.  Patient carries no insurance.  Will be difficult to place per TOC.  DVT prophylaxis: SCDs Start: 05/26/22 2254   Code Status: Full Code  Family Communication:  None present at bedside.  Plan of care discussed with patient in length and he/she verbalized understanding and agreed with it.  Status is: Inpatient Remains inpatient appropriate because: Medically stable, needs SNF placement.   Estimated body mass index is 26.57 kg/m as calculated from the following:   Height as of this encounter: 5' (1.524 m).   Weight as of this encounter: 61.7 kg.    Nutritional Assessment: Body mass index is 26.57 kg/m.Marland Kitchen Seen by dietician.  I agree with the assessment and plan as outlined below: Nutrition Status: Nutrition Problem: Increased nutrient needs Etiology: post-op healing Signs/Symptoms: other (comment) (Compression fracture) Interventions: Glucerna shake  . Skin Assessment: I have examined the patient's skin and I agree with the wound assessment as performed by the wound care RN as outlined below:    Consultants:  IR Infectious disease Procedures:  As above  Antimicrobials:  Anti-infectives (From admission, onward)    Start     Dose/Rate Route Frequency Ordered Stop   06/18/22 0839  ceFAZolin (ANCEF) IVPB 2g/100 mL premix        over 30 Minutes Intravenous Continuous PRN 06/18/22 0852 06/18/22 0839    06/18/22 0500  ceFAZolin (ANCEF) IVPB 2g/100 mL premix        2 g 200 mL/hr over 30 Minutes Intravenous To Short Stay 06/17/22 1338 06/19/22 0500   06/03/22 0700  vancomycin (VANCOREADY) IVPB 750 mg/150 mL  Status:  Discontinued        750 mg 150 mL/hr over 60 Minutes Intravenous Every 12 hours 06/02/22 1848 06/06/22 1429   06/02/22 1945  vancomycin (VANCOCIN) IVPB 1000 mg/200 mL premix        1,000 mg 200 mL/hr over 60 Minutes Intravenous  Once 06/02/22 1847 06/02/22 2109   06/02/22 1930  cefTRIAXone (ROCEPHIN) 2 g in sodium chloride 0.9 % 100 mL IVPB  Status:  Discontinued        2 g 200 mL/hr over 30 Minutes Intravenous Every 24 hours 06/02/22 1835 06/06/22 1429   06/02/22 0000  ceFAZolin (ANCEF) IVPB 2g/100 mL premix  Status:  Discontinued        2 g 200 mL/hr over 30 Minutes Intravenous To Radiology 05/30/22 1504 06/02/22 1835         Subjective:  Seen and examined.  No complaints.  Objective: Vitals:   06/22/22 1513 06/22/22 2017 06/23/22 0509 06/23/22 0755  BP: 116/78 100/64 109/75 112/74  Pulse: 100 94 93 93  Resp: 15 16 15 17   Temp: 98.3 F (36.8 C) 98.8 F (37.1 C) 98.8  F (37.1 C) 98 F (36.7 C)  TempSrc: Oral   Oral  SpO2: 92% 94% 90% 92%  Weight:      Height:        Intake/Output Summary (Last 24 hours) at 06/23/2022 1223 Last data filed at 06/23/2022 0500 Gross per 24 hour  Intake 240 ml  Output 450 ml  Net -210 ml    Filed Weights   06/04/22 0315 06/06/22 0500 06/07/22 0500  Weight: 60 kg 62.8 kg 61.7 kg    Examination:  General exam: Appears calm and comfortable  Respiratory system: Clear to auscultation. Respiratory effort normal. Cardiovascular system: S1 & S2 heard, RRR. No JVD, murmurs, rubs, gallops or clicks. No pedal edema. Gastrointestinal system: Abdomen is nondistended, soft and very mild left lower quadrant tenderness, no organomegaly or masses felt. Normal bowel sounds heard. Central nervous system: Alert and oriented. No  focal neurological deficits. Extremities: Symmetric 5 x 5 power. Skin: No rashes, lesions or ulcers.  Psychiatry: Judgement and insight appear normal. Mood & affect appropriate.   Data Reviewed: I have personally reviewed following labs and imaging studies  CBC: Recent Labs  Lab 06/17/22 0117 06/18/22 0012  WBC 8.9 8.5  HGB 7.9* 8.1*  HCT 23.7* 25.5*  MCV 69.7* 72.4*  PLT 889* 722*    Basic Metabolic Panel: Recent Labs  Lab 06/17/22 0117 06/18/22 0012 06/19/22 1350 06/20/22 0450  NA 133* 131*  --  131*  K 3.1* 3.7  --  3.5  CL 92* 93*  --  95*  CO2 30 30  --  30  GLUCOSE 105* 223*  --  87  BUN 5* 5*  --  6  CREATININE 0.77 0.84  --  0.90  CALCIUM 9.7 9.5  --  9.3  MG 1.6*  --  1.5*  --     GFR: Estimated Creatinine Clearance: 70.1 mL/min (by C-G formula based on SCr of 0.9 mg/dL). Liver Function Tests: Recent Labs  Lab 06/17/22 1504 06/18/22 0012  AST 21 20  ALT 8 8  ALKPHOS 94 91  BILITOT 0.8 0.4  PROT 7.3 6.2*  ALBUMIN 1.7* <1.5*    No results for input(s): "LIPASE", "AMYLASE" in the last 168 hours. No results for input(s): "AMMONIA" in the last 168 hours. Coagulation Profile: Recent Labs  Lab 06/18/22 0012  INR 1.2    Cardiac Enzymes: No results for input(s): "CKTOTAL", "CKMB", "CKMBINDEX", "TROPONINI" in the last 168 hours. BNP (last 3 results) No results for input(s): "PROBNP" in the last 8760 hours. HbA1C: No results for input(s): "HGBA1C" in the last 72 hours. CBG: Recent Labs  Lab 06/22/22 2016 06/22/22 2130 06/23/22 0523 06/23/22 0749 06/23/22 1143  GLUCAP 62* 136* 94 97 135*    Lipid Profile: No results for input(s): "CHOL", "HDL", "LDLCALC", "TRIG", "CHOLHDL", "LDLDIRECT" in the last 72 hours. Thyroid Function Tests: No results for input(s): "TSH", "T4TOTAL", "FREET4", "T3FREE", "THYROIDAB" in the last 72 hours. Anemia Panel: No results for input(s): "VITAMINB12", "FOLATE", "FERRITIN", "TIBC", "IRON", "RETICCTPCT" in the  last 72 hours. Sepsis Labs: No results for input(s): "PROCALCITON", "LATICACIDVEN" in the last 168 hours.  No results found for this or any previous visit (from the past 240 hour(s)).   Radiology Studies: No results found.  Scheduled Meds:  atorvastatin  40 mg Oral Daily   Chlorhexidine Gluconate Cloth  6 each Topical Daily   cholecalciferol  1,000 Units Oral Daily   donepezil  5 mg Oral QHS   feeding supplement (GLUCERNA SHAKE)  237  mL Oral TID BM   ferrous sulfate  325 mg Oral Q breakfast   gabapentin  200 mg Oral TID   insulin aspart  0-5 Units Subcutaneous QHS   insulin aspart  0-6 Units Subcutaneous TID WC   lidocaine  1 patch Transdermal Q24H   lidocaine  1 patch Transdermal Q24H   magnesium gluconate  500 mg Oral BID   metoprolol succinate  12.5 mg Oral Daily   morphine  15 mg Oral Q12H   nystatin  5 mL Oral QID   pantoprazole  40 mg Oral Daily   polyethylene glycol  17 g Oral BID   senna-docusate  1 tablet Oral BID   tamsulosin  0.4 mg Oral Daily   Continuous Infusions:     LOS: 28 days   Hughie Closs, MD [facility]  06/23/2022, 12:23 PM   *Please note that this is a verbal dictation therefore any spelling or grammatical errors are due to the "Dragon Medical One" system interpretation.  Please page via Amion and do not message via secure chat for urgent patient care matters. Secure chat can be used for non urgent patient care matters.  How to contact the [facility] Attending or Consulting provider 7A - 7P or covering provider during after hours 7P -7A, for this patient?  Check the care team in [facility] and look for a) attending/consulting TRH provider listed and b) the [facility] team listed. Page or secure chat 7A-7P. Log into [email or web address] and use [place] universal password to access. If you do not have the password, please contact the hospital operator. Locate the [facility] provider you are looking for under [facility] and page to a number that you can be  directly reached. If you still have difficulty reaching the provider, please page the [facility] (Director on Call) for the Hospitalists listed on amion for assistance.

## 2022-06-24 LAB — GLUCOSE, CAPILLARY
Glucose-Capillary: 99 mg/dL (ref 70–99)
Glucose-Capillary: 149 mg/dL — ABNORMAL HIGH (ref 70–99)
Glucose-Capillary: 96 mg/dL (ref 70–99)
Glucose-Capillary: 139 mg/dL — ABNORMAL HIGH (ref 70–99)

## 2022-06-24 NOTE — Progress Notes (Signed)
Mobility Specialist Progress Note   06/24/22 1225  Mobility  Activity Transferred from chair to bed  Level of Assistance Minimal assist, patient does 75% or more  Assistive Device Front wheel walker  Distance Ambulated (ft) 4 ft  Activity Response Tolerated well  $Mobility charge 1 Mobility   Pt requesting assistance to get from chair to bed d/t increased generalized pain. Required MinA  for initial stand but standby assist for remainder of transfer. Mod cues for sequencing given throughout d/t language barrier but pt returned BTB w/o fault. Pt left in the care of the RN.  Holland Falling Mobility Specialist MS [facility] #:  [phone] Acute Rehab Office:  [phone]

## 2022-06-24 NOTE — TOC Progression Note (Signed)
Transition of Care [facility]) - Progression Note    Patient Details  Name: Wayne Stokes MRN: [id number] Date of Birth: 1965-12-19  [facility] CM/SW Contact  Joanne Chars, LCSW Phone Number: 06/24/2022, 2:24 PM  Clinical Narrative:   CSW spoke with [facility].  They are unable to offer bed.     Expected Discharge Plan: [place] Barriers to Discharge: Continued Medical Work up  Expected Discharge Plan and Services Expected Discharge Plan: [place] In-house Referral: Development worker, community Discharge Planning Services: CM Consult                                           Social Determinants of Health (SDOH) Interventions    Readmission Risk Interventions     No data to display

## 2022-06-24 NOTE — Progress Notes (Signed)
Physical Therapy Treatment Patient Details Name: Wayne Stokes MRN: [id number] DOB: [birth year] Today's Date: 06/24/2022   History of Present Illness 56 y.o. male presented 05/26/22 with c/o severe and progressively worsening low back pain that radiates into his left buttock and down his LLE. Imaging +acute T12 compression fracture and  L4/5 there is multifactorial severe spinal stenosis and severe bilateral foraminal stenosis. Neurosurgery consult with no acute surgical intervention needed. 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    PT Comments    Continuing work on functional mobility and activity tolerance;  Needs encouragement and reinforcement of importance of mobility, but is still making slow progress, and has met the most recent ambulation goal (Acute PT goal update done);   I'm wondering if an orthotist consult for a better fitting TLSO brace will help; Wayne Stokes tends to move and fidget a lot, and his current brace can shift into a sub-optimal fit pretty easily;   Also, Pt's pain is difficult to address (but he continues to work with rehab therapies), he moans and cries during sessions, and it seems like our current regimen isn't meeting his needs; I'm wondering if a Palliative Medicine Consult, with a more wholistic approach would be helpful   Recommendations for follow up therapy are one component of a multi-disciplinary discharge planning process, led by the attending physician.  Recommendations may be updated based on patient status, additional functional criteria and insurance authorization.  Follow Up Recommendations  Skilled nursing-short term rehab (<3 hours/day) ([facility] unable to take him) Can patient physically be transported by  private vehicle: Yes   Assistance Recommended at Discharge Frequent or constant Supervision/Assistance  Patient can return home with the following A lot of help with bathing/dressing/bathroom;Assistance with cooking/housework;Assist for transportation;Help with stairs or ramp for entrance;Two people to help with walking and/or transfers;A little help with walking and/or transfers   Equipment Recommendations  BSC/3in1;Rolling walker (2 wheels)    Recommendations for Other Services       Precautions / Restrictions Precautions Precautions: Fall;Back Precaution Booklet Issued: No Precaution Comments: Reviewing back precautions s/p sx Required Braces or Orthoses: Spinal Brace Spinal Brace: Thoracolumbosacral orthotic;Applied in sitting position Restrictions Weight Bearing Restrictions: No     Mobility  Bed Mobility Overal bed mobility: Needs Assistance Bed Mobility: Supine to Sit           General bed mobility comments: sitting up in bed upon arrival, HOB elevated; hesitant to participate, so opted to move LEs to EOB, and push up from [facility] (already elevated), to help him sit up and make taking his meds easier    Transfers Overall transfer level: Needs assistance Equipment used: Rolling walker (2 wheels) Transfers: Sit to/from Stand Sit to Stand: Mod assist, Min assist           General transfer comment: Mod assist to rise and steady from the bed; Better sit to stand when standing from [facility] and using armrests    Ambulation/Gait Ambulation/Gait assistance: Min assist Gait Distance (Feet): 50 Feet (bathroom to hallway and back to chair) Assistive device: Rolling walker (2 wheels) Gait Pattern/deviations: Knee flexed in stance - left, Knee flexed in stance - right, Step-to pattern, Step-through pattern Gait velocity: decreased     General Gait Details: Walked from sink in bathroom into hallway,  across hallway to wall; reporting L knee and low back pain throughout. Assist  for balacne, and lots of encouragement given; able to verbalize that he was tired at teh end of the walk, but able to get to chair   Stairs             Wheelchair Mobility    Modified Rankin (Stroke Patients Only)       Balance     Sitting balance-Leahy Scale: Fair       Standing balance-Leahy Scale: Poor                              Cognition Arousal/Alertness: Awake/alert Behavior During Therapy: WFL for tasks assessed/performed, Impulsive Overall Cognitive Status: Impaired/Different from baseline Area of Impairment: Problem solving                               General Comments: Increased time for following commands. Internally distracted by pain; Lots of encouragement to participate        Exercises      General Comments General comments (skin integrity, edema, etc.): Ronny Flurry, Medical Interpreter, present for clearer communication      Pertinent Vitals/Pain Pain Assessment Pain Assessment: Faces Faces Pain Scale: Hurts little more Pain Location: Back Pain Descriptors / Indicators: Discomfort, Grimacing, Guarding, Moaning Pain Intervention(s): Monitored during session, Repositioned    Home Living                          Prior Function            PT Goals (current goals can now be found in the care plan section) Acute Rehab PT Goals Patient Stated Goal: Indicated he needed to use the bathroom PT Goal Formulation: With patient Time For Goal Achievement: 07/08/22 Potential to Achieve Goals: Fair Progress towards PT goals: Progressing toward goals (amb goal met; will udate goals)    Frequency    Min 4X/week      PT Plan Discharge plan needs to be updated    Co-evaluation              AM-PAC PT "6 Clicks" Mobility   Outcome Measure  Help needed turning from your back to your side while in a flat bed without using bedrails?: A Little Help needed moving from lying on your back to sitting on the  side of a flat bed without using bedrails?: A Little Help needed moving to and from a bed to a chair (including a wheelchair)?: A Little Help needed standing up from a chair using your arms (e.g., wheelchair or bedside chair)?: A Little Help needed to walk in hospital room?: A Little Help needed climbing 3-5 steps with a railing? : A Lot 6 Click Score: 17    End of Session Equipment Utilized During Treatment: Back brace Activity Tolerance: Patient tolerated treatment well Patient left: in chair;with call bell/phone within reach;with chair alarm set Nurse Communication: Mobility status PT Visit Diagnosis: Other abnormalities of gait and mobility (R26.89);Muscle weakness (generalized) (M62.81)     Time: 1029-1108 (minus apprx 5 minutes on commode) PT Time Calculation (min) (ACUTE ONLY): 39 min  Charges:  $Gait Training: 8-22 mins $Therapeutic Activity: 8-22 mins                     [place]  Office [place] 06/24/2022, 12:56 PM

## 2022-06-24 NOTE — Progress Notes (Signed)
PROGRESS NOTE    Wayne Stokes  ZDG:[id number] DOB: Jan 03, 1966 DOA: 05/26/2022 PCP: [name], NP   Brief Narrative:  Patient is a 56 year old male from [place] who [place], with history of diabetes type 2, hypertension, lumbar disc degenerative disease with spinal stenosis/ claudication, T12 vertebral compression fracture, mild cognitive impairment who was admitted for the management of uncontrolled low back pain, ambulatory difficulty due to severe lumbar spine stenosis and T12 compression fracture.  Orthopedics, neurosurgery evaluated the patient and recommended TLSO brace, pain control and outpatient follow-up.  PT recommending acute inpatient rehab.  Difficult placement due to lack of insurance.LLOS. IR planning for T12 kyphoplasty for persistent back pain.  Assessment & Plan:   Principal Problem:   Pyogenic arthritis of right shoulder region [facility]) Active Problems:   Spinal stenosis of lumbar region with neurogenic claudication   T12 compression fracture (HCC)   AKI (acute kidney injury) ([place])   Hyponatremia   Acute urinary retention   Type 2 diabetes mellitus without complication, without long-term current use of insulin (HCC)   Leukocytosis   Hypertension associated with diabetes ([place])   Iron deficiency anemia   Hyperlipidemia associated with type 2 diabetes mellitus (HCC)   Mild cognitive impairment   Thrush, oral   Right shoulder pain   Discitis of lumbar region   Chronic low back pain with bilateral sciatica  Severe back pain/lumbar spinal stenosis/T12 compression fracture: Presented with severe low back pain, ambulatory dysfunction.  MRI did not show any cord injury.  Neurosurgery consulted, no surgical intervention recommended, recommended TLSO brace.  IR was also consulted for consideration of T12 kyphoplasty but this was canceled due to fever.  Requested for kyphoplasty again because he does not have any signs of sepsis.  Patient eventually underwent  kyphoplasty on 06/18/2022.  Pain is controlled.  Seen by PT OT who recommends SNF but that is going to be challenging as he does not have insurance.   Fever/leukocytosis: Unclear source. Now resolved.  Chest x-ray did not show any pneumonia.  COVID-negative.  MRI of the spine on 9/18 was concerning for infection/discitis at L4 level.  Disc Aspiration was completed on 9/25 by IR.  He started on IV vancomycin, ceftriaxone.  Culture from spinal aspiration negative.  Patient had ongoing pain on the right shoulder, MRI was done which was suggestive of septic arthritis.  Orthopedics aspirated the shoulder on 9/27 with no fluid.  IR aspirated right shoulder on 9/29 without finding of any infection.  ID was also consulted, antibiotics discontinued.  Currently off antibiotics.    Right shoulder pain: Persistent.  Continue local anesthetics, oral pain medications.  Consideration of steroid injection in the shoulder as an outpatient.  Continue pain management.   Abdominal discomfort/microcytic  iron deficiency anemia: Continue iron supplementation.  Iron studies showed low iron as per 9/29.  No evidence of active bleeding.  Unaware about any hematochezia or melena. Transfused with a unit of PRBC on 9/28.  Continue monitoring,Hb in the range of 7-8.  Also he was complaining of abdominal pain so abdominal CT was obtained on 06/17/2022 which did not show any acute findings, distal CBD was slightly dilated but no stones or obstruction or lesion.  He was again complaining of left lower quad abdominal pain and had tenderness and mention of not passing gas so abdominal x-ray was obtained which did not show any SBO or ileus.  He is more comfortable today.  Very minimal left lower quadrant tenderness.   Acute urinary retention/AKI:  Felt to be secondary to back pain, mobility.  Started on Flomax.  Renal function is stabilized.  Failed voiding trial twice so Foley in place. we recommend follow-up with urology as an outpatient.      Constipation: Continue bowel regimen   Vitamin D deficiency: Continue supplementation   Hyponatremia :SIADH panel mostly negative.  Stable between 130-133 now  Hypomagnesemia: Resolved.  Recheck in the morning.   Diabetes type 2: Recent A1c of 7.4.  Hyperglycemic.  Will change SSI to sensitive scale.   Hypertension: Blood pressure well controlled   Hyperlipidemia: Continue Lipitor   Mild cognitive impairment: Continue donepezil   Sinus tachycardia: Most likely secondary to pain.  It is intermittent but improving.  Continue to monitor.  Continue Toprol-XL 12.5 mg p.o. daily.   Disposition: Waiting for SNF.  Patient carries no insurance.  Will be difficult to place per TOC.  DVT prophylaxis: SCDs Start: 05/26/22 2254   Code Status: Full Code  Family Communication:  None present at bedside.  Plan of care discussed with patient in length and he/she verbalized understanding and agreed with it.  Status is: Inpatient Remains inpatient appropriate because: Medically stable, needs SNF placement.   Estimated body mass index is 26.57 kg/m as calculated from the following:   Height as of this encounter: 5' (1.524 m).   Weight as of this encounter: 61.7 kg.    Nutritional Assessment: Body mass index is 26.57 kg/m.Marland Kitchen Seen by dietician.  I agree with the assessment and plan as outlined below: Nutrition Status: Nutrition Problem: Increased nutrient needs Etiology: post-op healing Signs/Symptoms: other (comment) (Compression fracture) Interventions: Glucerna shake  . Skin Assessment: I have examined the patient's skin and I agree with the wound assessment as performed by the wound care RN as outlined below:    Consultants:  IR Infectious disease Procedures:  As above  Antimicrobials:  Anti-infectives (From admission, onward)    Start     Dose/Rate Route Frequency Ordered Stop   06/18/22 0839  ceFAZolin (ANCEF) IVPB 2g/100 mL premix        over 30 Minutes Intravenous  Continuous PRN 06/18/22 0852 06/18/22 0839   06/18/22 0500  ceFAZolin (ANCEF) IVPB 2g/100 mL premix        2 g 200 mL/hr over 30 Minutes Intravenous To Short Stay 06/17/22 1338 06/19/22 0500   06/03/22 0700  vancomycin (VANCOREADY) IVPB 750 mg/150 mL  Status:  Discontinued        750 mg 150 mL/hr over 60 Minutes Intravenous Every 12 hours 06/02/22 1848 06/06/22 1429   06/02/22 1945  vancomycin (VANCOCIN) IVPB 1000 mg/200 mL premix        1,000 mg 200 mL/hr over 60 Minutes Intravenous  Once 06/02/22 1847 06/02/22 2109   06/02/22 1930  cefTRIAXone (ROCEPHIN) 2 g in sodium chloride 0.9 % 100 mL IVPB  Status:  Discontinued        2 g 200 mL/hr over 30 Minutes Intravenous Every 24 hours 06/02/22 1835 06/06/22 1429   06/02/22 0000  ceFAZolin (ANCEF) IVPB 2g/100 mL premix  Status:  Discontinued        2 g 200 mL/hr over 30 Minutes Intravenous To Radiology 05/30/22 1504 06/02/22 1835         Subjective:  Patient seen and examined.  He has no complaints.  Objective: Vitals:   06/23/22 0755 06/23/22 1432 06/23/22 2130 06/24/22 0300  BP: 112/74 113/76 103/83 112/76  Pulse: 93  100 92  Resp: 17 18 16  16  Temp: 98 F (36.7 C) 98.3 F (36.8 C) 98 F (36.7 C) 98 F (36.7 C)  TempSrc: Oral Oral  Oral  SpO2: 92%  91% 92%  Weight:      Height:        Intake/Output Summary (Last 24 hours) at 06/24/2022 1300 Last data filed at 06/24/2022 0530 Gross per 24 hour  Intake 200 ml  Output 1100 ml  Net -900 ml    Filed Weights   06/04/22 0315 06/06/22 0500 06/07/22 0500  Weight: 60 kg 62.8 kg 61.7 kg    Examination:  General exam: Appears calm and comfortable  Respiratory system: Clear to auscultation. Respiratory effort normal. Cardiovascular system: S1 & S2 heard, RRR. No JVD, murmurs, rubs, gallops or clicks. No pedal edema. Gastrointestinal system: Abdomen is nondistended, soft and nontender. No organomegaly or masses felt. Normal bowel sounds heard. Central nervous system:  Alert and oriented. No focal neurological deficits. Extremities: Symmetric 5 x 5 power.  Data Reviewed: I have personally reviewed following labs and imaging studies  CBC: Recent Labs  Lab 06/18/22 0012  WBC 8.5  HGB 8.1*  HCT 25.5*  MCV 72.4*  PLT 722*    Basic Metabolic Panel: Recent Labs  Lab 06/18/22 0012 06/19/22 1350 06/20/22 0450  NA 131*  --  131*  K 3.7  --  3.5  CL 93*  --  95*  CO2 30  --  30  GLUCOSE 223*  --  87  BUN 5*  --  6  CREATININE 0.84  --  0.90  CALCIUM 9.5  --  9.3  MG  --  1.5*  --     GFR: Estimated Creatinine Clearance: 70.1 mL/min (by C-G formula based on SCr of 0.9 mg/dL). Liver Function Tests: Recent Labs  Lab 06/17/22 1504 06/18/22 0012  AST 21 20  ALT 8 8  ALKPHOS 94 91  BILITOT 0.8 0.4  PROT 7.3 6.2*  ALBUMIN 1.7* <1.5*    No results for input(s): "LIPASE", "AMYLASE" in the last 168 hours. No results for input(s): "AMMONIA" in the last 168 hours. Coagulation Profile: Recent Labs  Lab 06/18/22 0012  INR 1.2    Cardiac Enzymes: No results for input(s): "CKTOTAL", "CKMB", "CKMBINDEX", "TROPONINI" in the last 168 hours. BNP (last 3 results) No results for input(s): "PROBNP" in the last 8760 hours. HbA1C: No results for input(s): "HGBA1C" in the last 72 hours. CBG: Recent Labs  Lab 06/23/22 1143 06/23/22 1540 06/23/22 2135 06/24/22 0739 06/24/22 1142  GLUCAP 135* 123* 117* 99 149*    Lipid Profile: No results for input(s): "CHOL", "HDL", "LDLCALC", "TRIG", "CHOLHDL", "LDLDIRECT" in the last 72 hours. Thyroid Function Tests: No results for input(s): "TSH", "T4TOTAL", "FREET4", "T3FREE", "THYROIDAB" in the last 72 hours. Anemia Panel: No results for input(s): "VITAMINB12", "FOLATE", "FERRITIN", "TIBC", "IRON", "RETICCTPCT" in the last 72 hours. Sepsis Labs: No results for input(s): "PROCALCITON", "LATICACIDVEN" in the last 168 hours.  No results found for this or any previous visit (from the past 240 hour(s)).    Radiology Studies: No results found.  Scheduled Meds:  atorvastatin  40 mg Oral Daily   Chlorhexidine Gluconate Cloth  6 each Topical Daily   cholecalciferol  1,000 Units Oral Daily   donepezil  5 mg Oral QHS   feeding supplement (GLUCERNA SHAKE)  237 mL Oral TID BM   ferrous sulfate  325 mg Oral Q breakfast   gabapentin  200 mg Oral TID   insulin aspart  0-5 Units Subcutaneous QHS  insulin aspart  0-6 Units Subcutaneous TID WC   lidocaine  1 patch Transdermal Q24H   lidocaine  1 patch Transdermal Q24H   magnesium gluconate  500 mg Oral BID   metoprolol succinate  12.5 mg Oral Daily   morphine  15 mg Oral Q12H   nystatin  5 mL Oral QID   pantoprazole  40 mg Oral Daily   polyethylene glycol  17 g Oral BID   senna-docusate  1 tablet Oral BID   tamsulosin  0.4 mg Oral Daily   Continuous Infusions:     LOS: 29 days   Darliss Cheney, MD [facility]  06/24/2022, 1:00 PM   *Please note that this is a verbal dictation therefore any spelling or grammatical errors are due to the "[place] One" system interpretation.  Please page via [place] and do not message via secure chat for urgent patient care matters. Secure chat can be used for non urgent patient care matters.  How to contact the [facility] Attending or Consulting provider [place] or covering provider during after hours [place], for this patient?  Check the care team in [facility] and look for a) attending/consulting [facility] provider listed and b) the [facility] team listed. Page or secure chat [phone]. Log into [email or web address] and use [place]'s universal password to access. If you do not have the password, please contact the hospital operator. Locate the [facility] provider you are looking for under [facility] and page to a number that you can be directly reached. If you still have difficulty reaching the provider, please page the [facility] (Director on Call) for the Hospitalists listed on amion for assistance.

## 2022-06-24 NOTE — Progress Notes (Signed)
Nutrition Follow-up  DOCUMENTATION CODES:   Not applicable  INTERVENTION:  Liberalize diet from a heart healthy to a regular diet to provide widest variety of menu options to enhance nutritional adequacy Continue Glucerna Shake po TID, each supplement provides 220 kcal and 10 grams of protein  NUTRITION DIAGNOSIS:   Increased nutrient needs related to post-op healing as evidenced by other (comment) (Compression fracture).  Ongoing  GOAL:   Patient will meet greater than or equal to 90% of their needs  Goal unmet  MONITOR:   PO intake  REASON FOR ASSESSMENT:   Consult Assessment of nutrition requirement/status  ASSESSMENT:   56 y.o. male admits related to evaluation of worsening lower back pain. PMH includes: T2DM, HTN, anemia, AKI. 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  Pending SNF placement.   Interpreter present at bedside. Pt working with PT at time of visit, preparing to walk.   Per review of documented meal completions, his intake appears to be minimal and sporadic. Average meal completions of 30% x7 recorded meals. Unable to obtain details from patient or obtain food preferences as pt is in pain and unable to regain focus. He just states that he does not want his breakfast as it is "too greasy." Noted breakfast potatoes, grits and fruit cup on tray. He also had soy milk which he states he will not drink. He has been accepting most Glucerna shakes, uncertain whether he is consuming them. Will continue with order for nursing to offer as appropriate.   Spoke with nursing outside room. States that pt now has thrush and is now being treated. Question whether this has been contributing to pt's decreased PO intake.   Pt' weight is -4.3 kg between 09/30 and 10/17   Medications: Vitamin D3, ferrous sulfate, SSI 0-5 units whs, SSI 0-6 units TID, magonate, nystatin,  protonix, miralax, senna  Labs: CBG's 97-149 x24 hours  Diet Order:   Diet Order             Diet Heart Room service appropriate? Yes; Fluid consistency: Thin  Diet effective now                   EDUCATION NEEDS:   Education needs have been addressed  Skin:  Skin Assessment: Reviewed RN Assessment  Last BM:  10/9 (type 2-small)  Height:   Ht Readings from Last 1 Encounters:  06/01/22 5' (1.524 m)    Weight:   Wt Readings from Last 1 Encounters:  06/24/22 57.4 kg    Ideal Body Weight:  48.2 kg  BMI:  Body mass index is 24.71 kg/m.  Estimated Nutritional Needs:   Kcal:  1600-1800  Protein:  80-95g  Fluid:  >/=1.6L  Clayborne Dana, RDN, LDN Clinical Nutrition

## 2022-06-25 LAB — GLUCOSE, CAPILLARY
Glucose-Capillary: 84 mg/dL (ref 70–99)
Glucose-Capillary: 106 mg/dL — ABNORMAL HIGH (ref 70–99)
Glucose-Capillary: 100 mg/dL — ABNORMAL HIGH (ref 70–99)
Glucose-Capillary: 114 mg/dL — ABNORMAL HIGH (ref 70–99)

## 2022-06-25 NOTE — Plan of Care (Signed)
  Problem: Education: Goal: Knowledge of General Education information will improve Description: Including pain rating scale, medication(s)/side effects and non-pharmacologic comfort measures Outcome: Progressing   Problem: Pain Managment: Goal: General experience of comfort will improve Outcome: Progressing   Problem: Skin Integrity: Goal: Risk for impaired skin integrity will decrease Outcome: Progressing   Problem: Skin Integrity: Goal: Risk for impaired skin integrity will decrease Outcome: Progressing   Problem: Tissue Perfusion: Goal: Adequacy of tissue perfusion will improve Outcome: Progressing

## 2022-06-25 NOTE — Progress Notes (Signed)
Physical Therapy Treatment Patient Details Name: Wayne Stokes MRN: [id number] DOB: Mar 06, 1966 Today's Date: 06/25/2022   History of Present Illness 56 y.o. male presented 05/26/22 with c/o severe and progressively worsening low back pain that radiates into his left buttock and down his LLE. Imaging +acute T12 compression fracture and  L4/5 there is multifactorial severe spinal stenosis and severe bilateral foraminal stenosis. Neurosurgery consult with no acute surgical intervention needed. IR consulted for possible kyphoplasty bu twas delayed due to fever; S/p T12 KP with biopsy on 10/11. 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    PT Comments    Continuing work on functional mobility and activity tolerance;  Wayne Stokes needed a lot of encouragement to get up and walk/participte, but once he was up and walking, he chose to continue walking, and was able to walk further today, including 2 rest breaks;   Discussed pt with OT, and he is progressing on the ADL front as well;  Knowing he is difficult to place, at this point we can pivot to looking more towards dc home; his roommate's health is failing, though, and we will need to find out about available assist; I wonder if there are refugee services available to Wayne Stokes;   Got a verbal order for orthotist consult for more manageable back brace -- hopeful that a simpler (but of course, appropriate) brace will be easier for Wayne Stokes to manage, and facilitate getting home   Recommendations for follow up therapy are one component of a multi-disciplinary discharge planning process, led by the attending physician.  Recommendations may be updated based on patient status, additional functional criteria and insurance authorization.  Follow Up Recommendations  Other (comment) (Difficult to place; consider STAR program as we likely need to shift dc plan to getting back  home) Can patient physically be transported by private vehicle: Yes   Assistance Recommended at Discharge Frequent or constant Supervision/Assistance  Patient can return home with the following A lot of help with bathing/dressing/bathroom;Assistance with cooking/housework;Assist for transportation;Help with stairs or ramp for entrance;A little help with walking and/or transfers   Equipment Recommendations  BSC/3in1;Rolling walker (2 wheels)    Recommendations for Other Services       Precautions / Restrictions Precautions Precautions: Fall;Back Precaution Comments: Back precautions for comfort Required Braces or Orthoses: Spinal Brace Spinal Brace: Thoracolumbosacral orthotic;Applied in sitting position Restrictions Weight Bearing Restrictions: No     Mobility  Bed Mobility Overal bed mobility: Needs Assistance Bed Mobility: Rolling, Sidelying to Sit, Sit to Supine Rolling: Min assist Sidelying to sit: Min assist       General bed mobility comments: Min assist this session, most likely due to reluctance to get up    Transfers Overall transfer level: Needs assistance Equipment used: Rolling walker (2 wheels) Transfers: Sit to/from Stand Sit to Stand: Min assist, Min guard           General transfer comment: Min assist to stand from the bed; minguard, without physical assist, to stand from recliner; cues to push up from armrests; stodd form recliner 3x    Ambulation/Gait Ambulation/Gait assistance: Min guard Gait Distance (Feet): 150 Feet (total, among 3 bouts of gait) Assistive device: Rolling walker (2 wheels) Gait Pattern/deviations: Knee flexed in stance - right, Knee flexed in stance - left Gait velocity: decreased     General Gait Details: Lots of encouragement to particiapte in progressive amb; as pt walked more, he seemed  to become more motivated to go farther; took 2 seated rest breaks, and opted to walk back to his room instead of rolling in  Engineer, production    Modified Rankin (Stroke Patients Only)       Balance     Sitting balance-Wayne Stokes Scale: Good       Standing balance-Wayne Stokes Scale: Poor (approaching Fair)                              Cognition Arousal/Alertness: Awake/alert Behavior During Therapy: Flat affect Overall Cognitive Status: Impaired/Different from baseline Area of Impairment: Problem solving, Following commands                       Following Commands: Follows one step commands with increased time     Problem Solving: Requires verbal cues General Comments: multimodal cues for direction following        Exercises      General Comments General comments (skin integrity, edema, etc.): Noting pt having difficulty donning and doffing this particular brace; worth considering Orthotist Consult for a more manageable brace      Pertinent Vitals/Pain Pain Assessment Pain Assessment: Faces Faces Pain Scale: Hurts whole lot Pain Location: R hip and anterior thigh Pain Descriptors / Indicators: Moaning Pain Intervention(s): Monitored during session    Home Living                          Prior Function            PT Goals (current goals can now be found in the care plan section) Acute Rehab PT Goals Patient Stated Goal: Did not state, but once up and walking, he did choose to walk farther before going back to the room PT Goal Formulation: With patient Time For Goal Achievement: 07/08/22 Potential to Achieve Goals: Fair Progress towards PT goals: Progressing toward goals    Frequency    Min 4X/week      PT Plan Discharge plan needs to be updated    Co-evaluation              AM-PAC PT "6 Clicks" Mobility   Outcome Measure  Help needed turning from your back to your side while in a flat bed without using bedrails?: None Help needed moving from lying on your back to sitting on the side of a flat  bed without using bedrails?: A Little Help needed moving to and from a bed to a chair (including a wheelchair)?: A Little Help needed standing up from a chair using your arms (e.g., wheelchair or bedside chair)?: A Little Help needed to walk in hospital room?: A Little Help needed climbing 3-5 steps with a railing? : A Lot 6 Click Score: 18    End of Session Equipment Utilized During Treatment: Back brace Activity Tolerance: Patient tolerated treatment well Patient left: in bed;with call bell/phone within reach;with bed alarm set Nurse Communication: Mobility status PT Visit Diagnosis: Other abnormalities of gait and mobility (R26.89);Muscle weakness (generalized) (M62.81)     Time: 5102-5852 PT Time Calculation (min) (ACUTE ONLY): 36 min  Charges:  $Gait Training: 23-37 mins                     Van Clines, PT  Acute Rehabilitation Services Office [phone]  [place] 06/25/2022, 1:32 PM

## 2022-06-25 NOTE — Progress Notes (Signed)
PROGRESS NOTE    Wayne Stokes  ZDG:[id number] DOB: Jan 03, 1966 DOA: 05/26/2022 PCP: [name], NP   Brief Narrative:  Patient is a 56 year old male from [place] who [place], with history of diabetes type 2, hypertension, lumbar disc degenerative disease with spinal stenosis/ claudication, T12 vertebral compression fracture, mild cognitive impairment who was admitted for the management of uncontrolled low back pain, ambulatory difficulty due to severe lumbar spine stenosis and T12 compression fracture.  Orthopedics, neurosurgery evaluated the patient and recommended TLSO brace, pain control and outpatient follow-up.  PT recommending acute inpatient rehab.  Difficult placement due to lack of insurance.LLOS. IR planning for T12 kyphoplasty for persistent back pain.  Assessment & Plan:   Principal Problem:   Pyogenic arthritis of right shoulder region [facility]) Active Problems:   Spinal stenosis of lumbar region with neurogenic claudication   T12 compression fracture (HCC)   AKI (acute kidney injury) ([place])   Hyponatremia   Acute urinary retention   Type 2 diabetes mellitus without complication, without long-term current use of insulin (HCC)   Leukocytosis   Hypertension associated with diabetes ([place])   Iron deficiency anemia   Hyperlipidemia associated with type 2 diabetes mellitus (HCC)   Mild cognitive impairment   Thrush, oral   Right shoulder pain   Discitis of lumbar region   Chronic low back pain with bilateral sciatica  Severe back pain/lumbar spinal stenosis/T12 compression fracture: Presented with severe low back pain, ambulatory dysfunction.  MRI did not show any cord injury.  Neurosurgery consulted, no surgical intervention recommended, recommended TLSO brace.  IR was also consulted for consideration of T12 kyphoplasty but this was canceled due to fever.  Requested for kyphoplasty again because he does not have any signs of sepsis.  Patient eventually underwent  kyphoplasty on 06/18/2022.  Pain is controlled.  Seen by PT OT who recommends SNF but that is going to be challenging as he does not have insurance.   Fever/leukocytosis: Unclear source. Now resolved.  Chest x-ray did not show any pneumonia.  COVID-negative.  MRI of the spine on 9/18 was concerning for infection/discitis at L4 level.  Disc Aspiration was completed on 9/25 by IR.  He started on IV vancomycin, ceftriaxone.  Culture from spinal aspiration negative.  Patient had ongoing pain on the right shoulder, MRI was done which was suggestive of septic arthritis.  Orthopedics aspirated the shoulder on 9/27 with no fluid.  IR aspirated right shoulder on 9/29 without finding of any infection.  ID was also consulted, antibiotics discontinued.  Currently off antibiotics.    Right shoulder pain: Persistent.  Continue local anesthetics, oral pain medications.  Consideration of steroid injection in the shoulder as an outpatient.  Continue pain management.   Abdominal discomfort/microcytic  iron deficiency anemia: Continue iron supplementation.  Iron studies showed low iron as per 9/29.  No evidence of active bleeding.  Unaware about any hematochezia or melena. Transfused with a unit of PRBC on 9/28.  Continue monitoring,Hb in the range of 7-8.  Also he was complaining of abdominal pain so abdominal CT was obtained on 06/17/2022 which did not show any acute findings, distal CBD was slightly dilated but no stones or obstruction or lesion.  He was again complaining of left lower quad abdominal pain and had tenderness and mention of not passing gas so abdominal x-ray was obtained which did not show any SBO or ileus.  He is more comfortable today.  Very minimal left lower quadrant tenderness.   Acute urinary retention/AKI:  Felt to be secondary to back pain, mobility.  Started on Flomax.  Renal function is stabilized.  Failed voiding trial twice so Foley in place. we recommend follow-up with urology as an outpatient.   Continue Flomax.   Constipation: Resolved.  Continue bowel regimen   Vitamin D deficiency: Continue supplementation   Hyponatremia :SIADH panel mostly negative.  Stable between 130-133 now  Hypomagnesemia: Resolved.     Diabetes type 2: Recent A1c of 7.4.  Continue SSI.   Hypertension: Blood pressure well controlled   Hyperlipidemia: Continue Lipitor   Mild cognitive impairment: Continue donepezil   Sinus tachycardia: Most likely secondary to pain.  It is intermittent but improving.  Continue to monitor.  Continue Toprol-XL 12.5 mg p.o. daily.   Disposition: Waiting for SNF.  Patient carries no insurance.  Will be difficult to place per TOC.  DVT prophylaxis: SCDs Start: 05/26/22 2254   Code Status: Full Code  Family Communication:  None present at bedside.  Plan of care discussed with patient in length and he/she verbalized understanding and agreed with it.  Status is: Inpatient Remains inpatient appropriate because: Medically stable, needs SNF placement.   Estimated body mass index is 25.1 kg/m as calculated from the following:   Height as of this encounter: 5' (1.524 m).   Weight as of this encounter: 58.3 kg.    Nutritional Assessment: Body mass index is 25.1 kg/m.Marland Kitchen Seen by dietician.  I agree with the assessment and plan as outlined below: Nutrition Status: Nutrition Problem: Increased nutrient needs Etiology: post-op healing Signs/Symptoms: other (comment) (Compression fracture) Interventions: Glucerna shake  . Skin Assessment: I have examined the patient's skin and I agree with the wound assessment as performed by the wound care RN as outlined below:    Consultants:  IR Infectious disease Procedures:  As above  Antimicrobials:  Anti-infectives (From admission, onward)    Start     Dose/Rate Route Frequency Ordered Stop   06/18/22 0839  ceFAZolin (ANCEF) IVPB 2g/100 mL premix        over 30 Minutes Intravenous Continuous PRN 06/18/22 0852 06/18/22  0839   06/18/22 0500  ceFAZolin (ANCEF) IVPB 2g/100 mL premix        2 g 200 mL/hr over 30 Minutes Intravenous To Short Stay 06/17/22 1338 06/19/22 0500   06/03/22 0700  vancomycin (VANCOREADY) IVPB 750 mg/150 mL  Status:  Discontinued        750 mg 150 mL/hr over 60 Minutes Intravenous Every 12 hours 06/02/22 1848 06/06/22 1429   06/02/22 1945  vancomycin (VANCOCIN) IVPB 1000 mg/200 mL premix        1,000 mg 200 mL/hr over 60 Minutes Intravenous  Once 06/02/22 1847 06/02/22 2109   06/02/22 1930  cefTRIAXone (ROCEPHIN) 2 g in sodium chloride 0.9 % 100 mL IVPB  Status:  Discontinued        2 g 200 mL/hr over 30 Minutes Intravenous Every 24 hours 06/02/22 1835 06/06/22 1429   06/02/22 0000  ceFAZolin (ANCEF) IVPB 2g/100 mL premix  Status:  Discontinued        2 g 200 mL/hr over 30 Minutes Intravenous To Radiology 05/30/22 1504 06/02/22 1835         Subjective:  Patient seen and examined.  Complains of right shoulder pain which is improving.  He did not complain of abdominal pain or any other back pain today.  Objective: Vitals:   06/24/22 2352 06/25/22 0431 06/25/22 0500 06/25/22 0821  BP: 116/81 97/69  106/74  Pulse: 95 85  87  Resp: 16 16  16   Temp: (!) 97.5 F (36.4 C) 98.7 F (37.1 C)  99 F (37.2 C)  TempSrc: Oral   Oral  SpO2: 100% 92%  93%  Weight:   58.3 kg   Height:        Intake/Output Summary (Last 24 hours) at 06/25/2022 0955 Last data filed at 06/25/2022 0432 Gross per 24 hour  Intake 240 ml  Output 1600 ml  Net -1360 ml    Filed Weights   06/07/22 0500 06/24/22 1407 06/25/22 0500  Weight: 61.7 kg 57.4 kg 58.3 kg    Examination:  General exam: Appears calm and comfortable  Respiratory system: Clear to auscultation. Respiratory effort normal. Cardiovascular system: S1 & S2 heard, RRR. No JVD, murmurs, rubs, gallops or clicks. No pedal edema. Gastrointestinal system: Abdomen is nondistended, soft and nontender. No organomegaly or masses felt. Normal  bowel sounds heard. Central nervous system: Alert and oriented. No focal neurological deficits.  Data Reviewed: I have personally reviewed following labs and imaging studies  CBC: No results for input(s): "WBC", "NEUTROABS", "HGB", "HCT", "MCV", "PLT" in the last 168 hours.  Basic Metabolic Panel: Recent Labs  Lab 06/19/22 1350 06/20/22 0450  NA  --  131*  K  --  3.5  CL  --  95*  CO2  --  30  GLUCOSE  --  87  BUN  --  6  CREATININE  --  0.90  CALCIUM  --  9.3  MG 1.5*  --     GFR: Estimated Creatinine Clearance: 64 mL/min (by C-G formula based on SCr of 0.9 mg/dL). Liver Function Tests: No results for input(s): "AST", "ALT", "ALKPHOS", "BILITOT", "PROT", "ALBUMIN" in the last 168 hours.  No results for input(s): "LIPASE", "AMYLASE" in the last 168 hours. No results for input(s): "AMMONIA" in the last 168 hours. Coagulation Profile: No results for input(s): "INR", "PROTIME" in the last 168 hours.  Cardiac Enzymes: No results for input(s): "CKTOTAL", "CKMB", "CKMBINDEX", "TROPONINI" in the last 168 hours. BNP (last 3 results) No results for input(s): "PROBNP" in the last 8760 hours. HbA1C: No results for input(s): "HGBA1C" in the last 72 hours. CBG: Recent Labs  Lab 06/24/22 0739 06/24/22 1142 06/24/22 1633 06/24/22 2006 06/25/22 0815  GLUCAP 99 149* 96 139* 84    Lipid Profile: No results for input(s): "CHOL", "HDL", "LDLCALC", "TRIG", "CHOLHDL", "LDLDIRECT" in the last 72 hours. Thyroid Function Tests: No results for input(s): "TSH", "T4TOTAL", "FREET4", "T3FREE", "THYROIDAB" in the last 72 hours. Anemia Panel: No results for input(s): "VITAMINB12", "FOLATE", "FERRITIN", "TIBC", "IRON", "RETICCTPCT" in the last 72 hours. Sepsis Labs: No results for input(s): "PROCALCITON", "LATICACIDVEN" in the last 168 hours.  No results found for this or any previous visit (from the past 240 hour(s)).   Radiology Studies: No results found.  Scheduled Meds:   atorvastatin  40 mg Oral Daily   Chlorhexidine Gluconate Cloth  6 each Topical Daily   cholecalciferol  1,000 Units Oral Daily   donepezil  5 mg Oral QHS   feeding supplement (GLUCERNA SHAKE)  237 mL Oral TID BM   ferrous sulfate  325 mg Oral Q breakfast   gabapentin  200 mg Oral TID   insulin aspart  0-5 Units Subcutaneous QHS   insulin aspart  0-6 Units Subcutaneous TID WC   lidocaine  1 patch Transdermal Q24H   lidocaine  1 patch Transdermal Q24H   magnesium gluconate  500 mg Oral BID  metoprolol succinate  12.5 mg Oral Daily   morphine  15 mg Oral Q12H   nystatin  5 mL Oral QID   pantoprazole  40 mg Oral Daily   polyethylene glycol  17 g Oral BID   senna-docusate  1 tablet Oral BID   tamsulosin  0.4 mg Oral Daily   Continuous Infusions:     LOS: 30 days   Hughie Closs, MD [facility]  06/25/2022, 9:55 AM   *Please note that this is a verbal dictation therefore any spelling or grammatical errors are due to the "[employer] One" system interpretation.  Please page via Amion and do not message via secure chat for urgent patient care matters. Secure chat can be used for non urgent patient care matters.  How to contact the [facility] Attending or Consulting provider 7A - 7P or covering provider during after hours 7P -7A, for this patient?  Check the care team in [facility] and look for a) attending/consulting [facility] provider listed and b) the [facility] team listed. Page or secure chat 7A-7P. Log into [email or web address] and use [place]'s universal password to access. If you do not have the password, please contact the hospital operator. Locate the [facility] provider you are looking for under [facility] and page to a number that you can be directly reached. If you still have difficulty reaching the provider, please page the [facility] (Director on Call) for the Hospitalists listed on amion for assistance.

## 2022-06-25 NOTE — Progress Notes (Signed)
Occupational Therapy Treatment Patient Details Name: Wayne Stokes MRN: [id number] DOB: May 07, 1966 Today's Date: 06/25/2022   History of present illness 56 y.o. male presented 05/26/22 with c/o severe and progressively worsening low back pain that radiates into his left buttock and down his LLE. Imaging +acute T12 compression fracture and  L4/5 there is multifactorial severe spinal stenosis and severe bilateral foraminal stenosis. Neurosurgery consult with no acute surgical intervention needed. 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   OT comments  Pt moaning throughout session, points to R hip when asked what hurts. With encouragement, pt demonstrated ability to perform seated grooming with set up, UB bathing with assist for back, UB dressing with set up and pericare with min guard assist in standing. Pt ambulated in room with min guard assist and RW, complained of feeling tired with ambulation. With increased time and encouragement, pt performed bed mobility modified independently, however slowly. No interpreter during session, but pt able to comply with directions with multimodal cues.   Recommendations for follow up therapy are one component of a multi-disciplinary discharge planning process, led by the attending physician.  Recommendations may be updated based on patient status, additional functional criteria and insurance authorization.    Follow Up Recommendations  Home health OT    Assistance Recommended at Discharge Frequent or constant Supervision/Assistance  Patient can return home with the following  A little help with walking and/or transfers;A little help with bathing/dressing/bathroom;Assistance with cooking/housework;Assist  for transportation;Help with stairs or ramp for entrance   Equipment Recommendations  BSC/3in1    Recommendations for Other Services      Precautions / Restrictions Precautions Precautions: Fall;Back Precaution Comments: back for comfort Required Braces or Orthoses: Spinal Brace Spinal Brace: Thoracolumbosacral orthotic;Applied in sitting position Restrictions Weight Bearing Restrictions: No       Mobility Bed Mobility Overal bed mobility: Needs Assistance Bed Mobility: Rolling, Sidelying to Sit, Sit to Supine Rolling: Modified independent (Device/Increase time) Sidelying to sit: Modified independent (Device/Increase time)   Sit to supine: Modified independent (Device/Increase time)   General bed mobility comments: increased time and encouragement to self assist    Transfers Overall transfer level: Needs assistance Equipment used: Rolling walker (2 wheels) Transfers: Sit to/from Stand Sit to Stand: Min guard           General transfer comment: no physical assist     Balance Overall balance assessment: Needs assistance, History of Falls   Sitting balance-Leahy Scale: Good     Standing balance support: Bilateral upper extremity supported, During functional activity Standing balance-Leahy Scale: Poor Standing balance comment: can release walker with one hand to wash bottom                           ADL either performed or assessed with clinical judgement   ADL Overall ADL's : Needs assistance/impaired     Grooming: Wash/dry hands;Wash/dry face;Sitting;Set up   Upper Body Bathing: Minimal assistance;Sitting Upper Body Bathing Details (indicate cue type and reason): assisted to wash back, cues to sequence     Upper Body Dressing : Set up;Sitting       Toilet Transfer: Min guard;Ambulation;Rolling walker (2 wheels)   Toileting- Clothing Manipulation and Hygiene: Supervision/safety;Sit to/from stand Therapist, nutritional  Details  (indicate cue type and reason): pt able to access periarea with encouragement     Functional mobility during ADLs: Min guard;Rolling walker (2 wheels)      Extremity/Trunk Assessment              Vision       Perception     Praxis      Cognition Arousal/Alertness: Awake/alert Behavior During Therapy: Flat affect Overall Cognitive Status: Impaired/Different from baseline Area of Impairment: Problem solving, Following commands                       Following Commands: Follows one step commands with increased time     Problem Solving: Requires verbal cues General Comments: multimodal cues for direction following        Exercises      Shoulder Instructions       General Comments      Pertinent Vitals/ Pain       Pain Assessment Pain Assessment: Faces Faces Pain Scale: Hurts little more Pain Location: R hip Pain Descriptors / Indicators: Moaning Pain Intervention(s): Monitored during session, Repositioned  Home Living                                          Prior Functioning/Environment              Frequency  Min 2X/week        Progress Toward Goals  OT Goals(current goals can now be found in the care plan section)  Progress towards OT goals: Progressing toward goals  Acute Rehab OT Goals OT Goal Formulation: With patient Time For Goal Achievement: 06/26/22 Potential to Achieve Goals: Good  Plan Discharge plan needs to be updated    Co-evaluation                 AM-PAC OT "6 Clicks" Daily Activity     Outcome Measure   Help from another person eating meals?: None Help from another person taking care of personal grooming?: A Little Help from another person toileting, which includes using toliet, bedpan, or urinal?: A Little Help from another person bathing (including washing, rinsing, drying)?: A Little Help from another person to put on and taking off regular upper body clothing?: A Little Help  from another person to put on and taking off regular lower body clothing?: A Little 6 Click Score: 19    End of Session Equipment Utilized During Treatment: Rolling walker (2 wheels);Gait belt  OT Visit Diagnosis: Other abnormalities of gait and mobility (R26.89);Muscle weakness (generalized) (M62.81);Pain   Activity Tolerance Patient tolerated treatment well   Patient Left in bed;with call bell/phone within reach;with bed alarm set   Nurse Communication Mobility status        Time: 1003-1030 OT Time Calculation (min): 27 min  Charges: OT General Charges $OT Visit: 1 Visit OT Treatments $Self Care/Home Management : 23-37 mins  Cleta Alberts, OTR/L Acute Rehabilitation Services Office: [phone]   Malka So 06/25/2022, 10:46 AM

## 2022-06-25 NOTE — Progress Notes (Signed)
Orthopedic Tech Progress Note Patient Details:  Wayne Stokes 1966-06-08 [id number]  PT called requesting a LSO   Ortho Devices Type of Ortho Device: Lumbar corsett Ortho Device/Splint Location: Back Ortho Device/Splint Interventions: Ordered   Post Interventions Patient Tolerated: Well Instructions Provided: Care of device  Janit Pagan 06/25/2022, 3:30 PM

## 2022-06-26 ENCOUNTER — Encounter (HOSPITAL_COMMUNITY): Payer: Self-pay | Admitting: Internal Medicine

## 2022-06-26 LAB — GLUCOSE, CAPILLARY
Glucose-Capillary: 85 mg/dL (ref 70–99)
Glucose-Capillary: 123 mg/dL — ABNORMAL HIGH (ref 70–99)
Glucose-Capillary: 99 mg/dL (ref 70–99)
Glucose-Capillary: 133 mg/dL — ABNORMAL HIGH (ref 70–99)

## 2022-06-26 NOTE — Progress Notes (Signed)
Physical Therapy Treatment Patient Details Name: Wayne Stokes MRN: [id number] DOB: 1966-07-04 Today's Date: 06/26/2022   History of Present Illness 56 y.o. male presented 05/26/22 with c/o severe and progressively worsening low back pain that radiates into his left buttock and down his LLE. Imaging +acute T12 compression fracture and  L4/5 there is multifactorial severe spinal stenosis and severe bilateral foraminal stenosis. Neurosurgery consult with no acute surgical intervention needed. IR consulted for possible kyphoplasty bu twas delayed due to fever; S/p T12 KP with biopsy on 10/11. 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    PT Comments    Patient continues to require max encouragement to perform tasks without physical assistance. Patient moaning throughout session. Found in feces in bed and required assistance for pericare. In standing, patient with fecal incontinence but also able to recognize he had to use the bathroom and ambulated to bathroom to complete bowel movement. Recommend toileting schedule as patient is aware of bowel movements but unsure if he understands to call for help to go to bathroom.  Assisted with pericare x 3 during session. Continue to recommend SNF for ongoing Physical Therapy. Unsure if cognitive deficits will allow for patient to be safe to discharge home alone.    Recommendations for follow up therapy are one component of a multi-disciplinary discharge planning process, led by the attending physician.  Recommendations may be updated based on patient status, additional functional criteria and insurance authorization.  Follow Up Recommendations  Skilled nursing-short term rehab (<3 hours/day) Can patient physically be transported by private vehicle: Yes   Assistance Recommended at Discharge Frequent or constant Supervision/Assistance  Patient can return  home with the following A lot of help with bathing/dressing/bathroom;Assistance with cooking/housework;Assist for transportation;Help with stairs or ramp for entrance;A little help with walking and/or transfers   Equipment Recommendations  BSC/3in1;Rolling Emerlyn Mehlhoff (2 wheels)    Recommendations for Other Services       Precautions / Restrictions Precautions Precautions: Fall;Back Precaution Booklet Issued: No Precaution Comments: Back precautions for comfort Required Braces or Orthoses: Spinal Brace Spinal Brace: Lumbar corset;Applied in sitting position Restrictions Weight Bearing Restrictions: No     Mobility  Bed Mobility Overal bed mobility: Needs Assistance Bed Mobility: Rolling, Sidelying to Sit Rolling: Min guard Sidelying to sit: Min guard       General bed mobility comments: increased time to complete. Max encouragement to perform without assistance. Found in fecal incontinence requiring assist for pericare    Transfers Overall transfer level: Needs assistance Equipment used: Rolling Loney Domingo (2 wheels) Transfers: Sit to/from Stand Sit to Stand: Min guard, Min assist           General transfer comment: min guard initially after max encouragement to perform without assistance. Patient pointing at his hands stating "hurt" but with encouragement able to perform. With fatigue, requiring minA to stand. Incontinent of feces in standing and able to recognize he needed to use the bathroom    Ambulation/Gait Ambulation/Gait assistance: Min guard Gait Distance (Feet): 15 Feet (+15') Assistive device: Rolling Sharisa Toves (2 wheels) Gait Pattern/deviations: Knee flexed in stance - right, Knee flexed in stance - left Gait velocity: decreased     General Gait Details: max encouragement to ambulate to bathroom. Moaning throughout but still able to complete.   Stairs             Wheelchair Mobility    Modified Rankin (Stroke Patients Only)  Balance Overall  balance assessment: Needs assistance, History of Falls Sitting-balance support: No upper extremity supported, Feet supported Sitting balance-Leahy Scale: Good     Standing balance support: Bilateral upper extremity supported, During functional activity Standing balance-Leahy Scale: Poor                              Cognition Arousal/Alertness: Awake/alert Behavior During Therapy: Flat affect Overall Cognitive Status: Impaired/Different from baseline Area of Impairment: Problem solving, Following commands                       Following Commands: Follows one step commands with increased time Safety/Judgement: Decreased awareness of safety   Problem Solving: Requires verbal cues General Comments: multimodal cues for direction following. At times requesting help but able to perform tasks without assistance        Exercises      General Comments        Pertinent Vitals/Pain Pain Assessment Pain Assessment: Faces Faces Pain Scale: Hurts little more Pain Location: back Pain Descriptors / Indicators: Moaning Pain Intervention(s): Monitored during session    Home Living                          Prior Function            PT Goals (current goals can now be found in the care plan section) Acute Rehab PT Goals Patient Stated Goal: did not state PT Goal Formulation: With patient Time For Goal Achievement: 07/08/22 Potential to Achieve Goals: Fair Progress towards PT goals: Progressing toward goals    Frequency    Min 3X/week      PT Plan Discharge plan needs to be updated;Frequency needs to be updated    Co-evaluation              AM-PAC PT "6 Clicks" Mobility   Outcome Measure  Help needed turning from your back to your side while in a flat bed without using bedrails?: None Help needed moving from lying on your back to sitting on the side of a flat bed without using bedrails?: A Little Help needed moving to and from a bed  to a chair (including a wheelchair)?: A Little Help needed standing up from a chair using your arms (e.g., wheelchair or bedside chair)?: A Little Help needed to walk in hospital room?: A Little Help needed climbing 3-5 steps with a railing? : A Lot 6 Click Score: 18    End of Session Equipment Utilized During Treatment: Back brace Activity Tolerance: Patient tolerated treatment well Patient left: in chair;with call bell/phone within reach;with chair alarm set Nurse Communication: Mobility status PT Visit Diagnosis: Other abnormalities of gait and mobility (R26.89);Muscle weakness (generalized) (M62.81)     Time: 7062-3762 PT Time Calculation (min) (ACUTE ONLY): 46 min  Charges:  $Therapeutic Activity: 38-52 mins                     Lailee Hoelzel A. Gilford Rile PT, DPT Acute Rehabilitation Services Office [phone]    Linna Hoff 06/26/2022, 1:26 PM

## 2022-06-26 NOTE — Progress Notes (Signed)
Mobility Specialist Progress Note   06/26/22 1340  Mobility  Activity Transferred to/from [facility]  Level of Assistance Minimal assist, patient does 75% or more  Assistive Device Front wheel walker  Distance Ambulated (ft) 4 ft  Activity Response Tolerated well  $Mobility charge 1 Mobility   Received in chair moaning but unable to pinpoint discomfort. Session limited d/t pt's bowel incontinence. MinA to [facility] w/o fault, Pt on BSC for ~15 mins. NT present in room and pt left on BSC.   Holland Falling Mobility Specialist Acute Rehab Office:  [phone]

## 2022-06-26 NOTE — Progress Notes (Signed)
Occupational Therapy Treatment Patient Details Name: Wayne Stokes MRN: [id number] DOB: Mar 16, 1966 Today's Date: 06/26/2022   History of present illness 56 y.o. male presented 05/26/22 with c/o severe and progressively worsening low back pain that radiates into his left buttock and down his LLE. Imaging +acute T12 compression fracture and  L4/5 there is multifactorial severe spinal stenosis and severe bilateral foraminal stenosis. Neurosurgery consult with no acute surgical intervention needed. IR consulted for possible kyphoplasty bu twas delayed due to fever; S/p T12 KP with biopsy on 10/11. 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   OT comments  Pt seated on BSC and finished with BM, but did not problem solve to use call button to call for assistance. Moaning throughout session, but did not identify whether or where he was hurting. Pt needing multimodal cues to participate in percare. Decreased thoroughness with pericare and washing hands afterwards. Min guard assist for mobility with RW. Returned to supine without assist. Pt cued to remove LSO prior to return to supine. Given pt's impaired cognition and lack of reliable assistance at home, recommending SNF.    Recommendations for follow up therapy are one component of a multi-disciplinary discharge planning process, led by the attending physician.  Recommendations may be updated based on patient status, additional functional criteria and insurance authorization.    Follow Up Recommendations  Skilled nursing-short term rehab (<3 hours/day)    Assistance Recommended at Discharge Frequent or constant Supervision/Assistance  Patient can return home with the following  A little help with walking and/or transfers;Assistance with cooking/housework;Assist for transportation;Help with stairs or ramp for entrance;A lot of help with  bathing/dressing/bathroom;Direct supervision/assist for medications management   Equipment Recommendations  BSC/3in1    Recommendations for Other Services      Precautions / Restrictions Precautions Precautions: Fall;Back Precaution Booklet Issued: No Precaution Comments: Back precautions for comfort Required Braces or Orthoses: Spinal Brace Spinal Brace: Lumbar corset;Applied in sitting position Restrictions Weight Bearing Restrictions: No       Mobility Bed Mobility Overal bed mobility: Needs Assistance Bed Mobility: Sit to Supine       Sit to supine: Modified independent (Device/Increase time)   General bed mobility comments: no physical assist to return to bed, cues and min assist to remove LSO before returning to supine    Transfers Overall transfer level: Needs assistance Equipment used: Rolling walker (2 wheels) Transfers: Sit to/from Stand Sit to Stand: Min guard           General transfer comment: min guard from [facility]     Balance Overall balance assessment: Needs assistance, History of Falls   Sitting balance-Leahy Scale: Good     Standing balance support: Bilateral upper extremity supported, During functional activity Standing balance-Leahy Scale: Poor Standing balance comment: can release walker with one hand to wipe bottom                           ADL either performed or assessed with clinical judgement   ADL Overall ADL's : Needs assistance/impaired     Grooming: Wash/dry hands;Wash/dry face;Sitting;Minimal assistance Grooming Details (indicate cue type and reason): assist for thoroughness following pericare                 Toilet Transfer: Min guard;Ambulation;BSC/3in1;Rolling walker (2 wheels)   Toileting- Clothing Manipulation and Hygiene: Minimal assistance;Sit to/from stand Toileting - Water quality scientist Details (indicate cue  type and reason): assist for thoroughness, encouragement to perform pericare himself,  repeatedly attempting to hand washcloth to OT     Functional mobility during ADLs: Min guard;Rolling walker (2 wheels)      Extremity/Trunk Assessment              Vision       Perception     Praxis      Cognition Arousal/Alertness: Awake/alert Behavior During Therapy: Flat affect Overall Cognitive Status: Impaired/Different from baseline Area of Impairment: Problem solving, Following commands                       Following Commands: Follows one step commands with increased time     Problem Solving: Requires verbal cues, Requires tactile cues          Exercises      Shoulder Instructions       General Comments      Pertinent Vitals/ Pain       Pain Assessment Pain Assessment: Faces Faces Pain Scale: Hurts little more Pain Location: generalized, pt unable to state where Pain Descriptors / Indicators: Moaning Pain Intervention(s): Monitored during session, Premedicated before session, Repositioned  Home Living                                          Prior Functioning/Environment              Frequency  Min 2X/week        Progress Toward Goals  OT Goals(current goals can now be found in the care plan section)  Progress towards OT goals: Progressing toward goals  Acute Rehab OT Goals OT Goal Formulation: With patient Time For Goal Achievement: 07/10/22 Potential to Achieve Goals: Fair ADL Goals Pt Will Perform Grooming: standing;with supervision Pt Will Perform Lower Body Dressing: sit to/from stand;with supervision Pt Will Transfer to Toilet: with supervision;ambulating Additional ADL Goal #1: Pt will perform bed mobility independently in preparation for ADLs. Additional ADL Goal #2: Pt will independent don/doff brace in preparation for ADLs  Plan Discharge plan needs to be updated    Co-evaluation                 AM-PAC OT "6 Clicks" Daily Activity     Outcome Measure   Help from another  person eating meals?: None Help from another person taking care of personal grooming?: A Little Help from another person toileting, which includes using toliet, bedpan, or urinal?: A Little Help from another person bathing (including washing, rinsing, drying)?: A Little Help from another person to put on and taking off regular upper body clothing?: A Little Help from another person to put on and taking off regular lower body clothing?: A Little 6 Click Score: 19    End of Session Equipment Utilized During Treatment: Rolling walker (2 wheels);Back brace  OT Visit Diagnosis: Other abnormalities of gait and mobility (R26.89);Muscle weakness (generalized) (M62.81);Pain   Activity Tolerance Patient tolerated treatment well   Patient Left in bed;with call bell/phone within reach;with bed alarm set   Nurse Communication          Time: 4696-2952 OT Time Calculation (min): 24 min  Charges: OT General Charges $OT Visit: 1 Visit OT Treatments $Self Care/Home Management : 23-37 mins  Berna Spare, OTR/L Acute Rehabilitation Services Office: [phone]  Evern Bio 06/26/2022, 2:50 PM

## 2022-06-26 NOTE — TOC Progression Note (Signed)
Transition of Care [facility]) - Progression Note    Patient Details  Name: Wayne Stokes MRN: [id number] Date of Birth: 1966/09/06  Transition of Care [facility] CM/SW Contact  Joanne Chars, LCSW Phone Number: 06/26/2022, 2:58 PM  Clinical Narrative:   CSW reached out to Lauren/CIR to see if they can reassess pt for CIR admit.  Pt has made some gains in PT/OT. Lauren responded and will review.    Expected Discharge Plan: [place] Barriers to Discharge: Continued Medical Work up  Expected Discharge Plan and Services Expected Discharge Plan: [place] In-house Referral: Development worker, community Discharge Planning Services: CM Consult                                           Social Determinants of Health (SDOH) Interventions    Readmission Risk Interventions     No data to display

## 2022-06-26 NOTE — Progress Notes (Signed)
Progress Note    Wayne Stokes  MWU:[id number] DOB: 1965/10/26  DOA: 05/26/2022 PCP: [name], NP      Brief Narrative:    Medical records reviewed and are as summarized below:  Wayne Stokes is a 56 y.o. male from [place] who peaks [place], with history of diabetes type 2, hypertension, lumbar disc degenerative disease with spinal stenosis/ claudication, T12 vertebral compression fracture, mild cognitive impairment who was admitted for the management of uncontrolled low back pain, ambulatory difficulty due to severe lumbar spine stenosis and T12 compression fracture.  Orthopedics, neurosurgery evaluated the patient and recommended TLSO brace, pain control and outpatient follow-up.  PT recommending acute inpatient rehab.  Difficult placement due to lack of insurance.LLOS. IR performed kyphoplasty for T12 compression fracture on 06/18/2022        Assessment/Plan:   Principal Problem:   Pyogenic arthritis of right shoulder region [facility]) Active Problems:   Spinal stenosis of lumbar region with neurogenic claudication   T12 compression fracture (HCC)   AKI (acute kidney injury) ([place])   Hyponatremia   Acute urinary retention   Type 2 diabetes mellitus without complication, without long-term current use of insulin (HCC)   Leukocytosis   Hypertension associated with diabetes ([place])   Iron deficiency anemia   Hyperlipidemia associated with type 2 diabetes mellitus (HCC)   Mild cognitive impairment   Thrush, oral   Right shoulder pain   Discitis of lumbar region   Chronic low back pain with bilateral sciatica   Nutrition Problem: Increased nutrient needs Etiology: post-op healing  Signs/Symptoms: other (comment) (Compression fracture)   Body mass index is 25.1 kg/m.   Severe back pain/lumbar spinal stenosis/T12 compression fracture: Presented with severe low back pain, ambulatory dysfunction.  MRI did not show any cord injury.  Neurosurgery consulted, no surgical  intervention recommended, recommended TLSO brace.  IR was also consulted for consideration of T12 kyphoplasty but this was canceled due to fever.  Requested for kyphoplasty again because he does not have any signs of sepsis. Patient eventually underwent kyphoplasty on 06/18/2022.  Discharge to SNF was recommended by PT.  Patient does not have insurance and this is going to be challenging.  He does not have much support at home.    Fever/leukocytosis: Unclear source. Now resolved.  Chest x-ray did not show any pneumonia.  COVID-negative.  MRI of the spine on 9/18 was concerning for infection/discitis at L4 level.  Disc Aspiration was completed on 9/25 by IR.  He started on IV vancomycin, ceftriaxone.  Culture from spinal aspiration negative.  Patient had ongoing pain on the right shoulder, MRI was done which was suggestive of septic arthritis.  Orthopedics aspirated the shoulder on 9/27 with no fluid.  IR aspirated right shoulder on 9/29 without finding of any infection.  ID was also consulted, antibiotics discontinued.  Currently off antibiotics.    Right shoulder pain: Persistent.  Continue local anesthetics, oral pain medications.  Consideration of steroid injection in the shoulder as an outpatient.  Continue pain management.   Abdominal discomfort/microcytic  iron deficiency anemia:  Abdominal pain seems to be improving.  Stools are dark but it is likely from iron pills. Continue iron supplementation.  Iron studies showed low iron as per 9/29.  No evidence of active bleeding.  Unaware about any hematochezia or melena. Transfused with a unit of PRBC on 9/28.  Continue monitoring,Hb in the range of 7-8.  Also he was complaining of abdominal pain so abdominal CT was obtained  on 06/17/2022 which did not show any acute findings, distal CBD was slightly dilated but no stones or obstruction or lesion.  He was again complaining of left lower quad abdominal pain and had tenderness and mention of not passing gas  so abdominal x-ray was obtained which did not show any SBO or ileus.   Repeat CBC tomorrow  Acute urinary retention/AKI: Felt to be secondary to back pain, mobility.  Started on Flomax.  Renal function is stabilized.  Failed voiding trial twice so Foley in place. we recommend follow-up with urology as an outpatient.  Continue Flomax.   Constipation: Resolved.  Use laxatives as needed.   Vitamin D deficiency: Continue supplementation   Hyponatremia :SIADH panel mostly negative.  Stable between 130-133 now   Hypomagnesemia: Improved  Sinus tachycardia: Improved.  Continue Toprol    Other comorbidities include type II DM (hemoglobin A1c of 7.4), hypertension, hyperlipidemia, mild cognitive impairment      Diet Order             Diet Heart Room service appropriate? Yes; Fluid consistency: Thin  Diet effective now                            Consultants: Interventional radiologist Infectious disease specialist  Procedures: Kyphoplasty for T12 compression fracture on 06/18/2022 Fluoroscopy guided L4-5 disc aspiration    Medications:    atorvastatin  40 mg Oral Daily   Chlorhexidine Gluconate Cloth  6 each Topical Daily   cholecalciferol  1,000 Units Oral Daily   donepezil  5 mg Oral QHS   feeding supplement (GLUCERNA SHAKE)  237 mL Oral TID BM   ferrous sulfate  325 mg Oral Q breakfast   gabapentin  200 mg Oral TID   insulin aspart  0-5 Units Subcutaneous QHS   insulin aspart  0-6 Units Subcutaneous TID WC   lidocaine  1 patch Transdermal Q24H   lidocaine  1 patch Transdermal Q24H   magnesium gluconate  500 mg Oral BID   metoprolol succinate  12.5 mg Oral Daily   morphine  15 mg Oral Q12H   nystatin  5 mL Oral QID   pantoprazole  40 mg Oral Daily   polyethylene glycol  17 g Oral BID   senna-docusate  1 tablet Oral BID   tamsulosin  0.4 mg Oral Daily   Continuous Infusions:   Anti-infectives (From admission, onward)    Start     Dose/Rate Route  Frequency Ordered Stop   06/18/22 0839  ceFAZolin (ANCEF) IVPB 2g/100 mL premix        over 30 Minutes Intravenous Continuous PRN 06/18/22 0852 06/18/22 0839   06/18/22 0500  ceFAZolin (ANCEF) IVPB 2g/100 mL premix        2 g 200 mL/hr over 30 Minutes Intravenous To Short Stay 06/17/22 1338 06/19/22 0500   06/03/22 0700  vancomycin (VANCOREADY) IVPB 750 mg/150 mL  Status:  Discontinued        750 mg 150 mL/hr over 60 Minutes Intravenous Every 12 hours 06/02/22 1848 06/06/22 1429   06/02/22 1945  vancomycin (VANCOCIN) IVPB 1000 mg/200 mL premix        1,000 mg 200 mL/hr over 60 Minutes Intravenous  Once 06/02/22 1847 06/02/22 2109   06/02/22 1930  cefTRIAXone (ROCEPHIN) 2 g in sodium chloride 0.9 % 100 mL IVPB  Status:  Discontinued        2 g 200 mL/hr over 30 Minutes Intravenous Every  24 hours 06/02/22 1835 06/06/22 1429   06/02/22 0000  ceFAZolin (ANCEF) IVPB 2g/100 mL premix  Status:  Discontinued        2 g 200 mL/hr over 30 Minutes Intravenous To Radiology 05/30/22 1504 06/02/22 1835              Family Communication/Anticipated D/C date and plan/Code Status   DVT prophylaxis: SCDs Start: 05/26/22 2254     Code Status: Full Code  Family Communication: None Disposition Plan: To be determined but will probably go home with   Status is: Inpatient Remains inpatient appropriate because: Unsafe discharge plan       Subjective:   Interval events noted.  He complains of pain in the right shoulder and all over   Objective:    Vitals:   06/25/22 2209 06/26/22 0414 06/26/22 0937 06/26/22 1023  BP: 104/75 106/75 108/75 121/87  Pulse: 87 87 99 95  Resp: 16 16 18    Temp: 98 F (36.7 C) 97.8 F (36.6 C) 98.7 F (37.1 C)   TempSrc:      SpO2: 95% 93% 92%   Weight:      Height:       No data found.   Intake/Output Summary (Last 24 hours) at 06/26/2022 1252 Last data filed at 06/25/2022 2100 Gross per 24 hour  Intake 240 ml  Output 300 ml  Net -60 ml    Filed Weights   06/07/22 0500 06/24/22 1407 06/25/22 0500  Weight: 61.7 kg 57.4 kg 58.3 kg    Exam:  GEN: NAD SKIN: Warm and dry EYES: EOMI ENT: MMM CV: RRR PULM: CTA B ABD: soft, ND, NT, +BS CNS: AAO x 3, non focal EXT: No edema or tenderness        Data Reviewed:   I have personally reviewed following labs and imaging studies:  Labs: Labs show the following:   Basic Metabolic Panel: Recent Labs  Lab 06/19/22 1350 06/20/22 0450  NA  --  131*  K  --  3.5  CL  --  95*  CO2  --  30  GLUCOSE  --  87  BUN  --  6  CREATININE  --  0.90  CALCIUM  --  9.3  MG 1.5*  --    GFR Estimated Creatinine Clearance: 64 mL/min (by C-G formula based on SCr of 0.9 mg/dL). Liver Function Tests: No results for input(s): "AST", "ALT", "ALKPHOS", "BILITOT", "PROT", "ALBUMIN" in the last 168 hours. No results for input(s): "LIPASE", "AMYLASE" in the last 168 hours. No results for input(s): "AMMONIA" in the last 168 hours. Coagulation profile No results for input(s): "INR", "PROTIME" in the last 168 hours.  CBC: No results for input(s): "WBC", "NEUTROABS", "HGB", "HCT", "MCV", "PLT" in the last 168 hours. Cardiac Enzymes: No results for input(s): "CKTOTAL", "CKMB", "CKMBINDEX", "TROPONINI" in the last 168 hours. BNP (last 3 results) No results for input(s): "PROBNP" in the last 8760 hours. CBG: Recent Labs  Lab 06/25/22 1156 06/25/22 1646 06/25/22 2210 06/26/22 0813 06/26/22 1147  GLUCAP 106* 100* 114* 85 123*   D-Dimer: No results for input(s): "DDIMER" in the last 72 hours. Hgb A1c: No results for input(s): "HGBA1C" in the last 72 hours. Lipid Profile: No results for input(s): "CHOL", "HDL", "LDLCALC", "TRIG", "CHOLHDL", "LDLDIRECT" in the last 72 hours. Thyroid function studies: No results for input(s): "TSH", "T4TOTAL", "T3FREE", "THYROIDAB" in the last 72 hours.  Invalid input(s): "FREET3" Anemia work up: No results for input(s): "VITAMINB12", "FOLATE",  "FERRITIN", "  TIBC", "IRON", "RETICCTPCT" in the last 72 hours. Sepsis Labs: No results for input(s): "PROCALCITON", "WBC", "LATICACIDVEN" in the last 168 hours.  Microbiology No results found for this or any previous visit (from the past 240 hour(s)).  Procedures and diagnostic studies:  No results found.             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 If 7PM-7AM, please contact night-coverage at [email or web address]     06/26/2022, 12:52 PM

## 2022-06-27 LAB — GLUCOSE, CAPILLARY
Glucose-Capillary: 80 mg/dL (ref 70–99)
Glucose-Capillary: 87 mg/dL (ref 70–99)
Glucose-Capillary: 105 mg/dL — ABNORMAL HIGH (ref 70–99)
Glucose-Capillary: 146 mg/dL — ABNORMAL HIGH (ref 70–99)
Glucose-Capillary: 149 mg/dL — ABNORMAL HIGH (ref 70–99)

## 2022-06-27 LAB — CBC WITH DIFFERENTIAL/PLATELET
Abs Immature Granulocytes: 0.07 10*3/uL (ref 0.00–0.07)
Basophils Absolute: 0.1 10*3/uL (ref 0.0–0.1)
Basophils Relative: 1 %
Eosinophils Absolute: 0.2 10*3/uL (ref 0.0–0.5)
Eosinophils Relative: 3 %
HCT: 23.5 % — ABNORMAL LOW (ref 39.0–52.0)
Hemoglobin: 7.9 g/dL — ABNORMAL LOW (ref 13.0–17.0)
Immature Granulocytes: 1 %
Lymphocytes Relative: 18 %
Lymphs Abs: 1.5 10*3/uL (ref 0.7–4.0)
MCH: 23 pg — ABNORMAL LOW (ref 26.0–34.0)
MCHC: 33.6 g/dL (ref 30.0–36.0)
MCV: 68.5 fL — ABNORMAL LOW (ref 80.0–100.0)
Monocytes Absolute: 1.5 10*3/uL — ABNORMAL HIGH (ref 0.1–1.0)
Monocytes Relative: 17 %
Neutro Abs: 5.2 10*3/uL (ref 1.7–7.7)
Neutrophils Relative %: 60 %
Platelets: 498 10*3/uL — ABNORMAL HIGH (ref 150–400)
RBC: 3.43 MIL/uL — ABNORMAL LOW (ref 4.22–5.81)
RDW: 20.4 % — ABNORMAL HIGH (ref 11.5–15.5)
WBC: 8.6 10*3/uL (ref 4.0–10.5)
nRBC: 0 % (ref 0.0–0.2)

## 2022-06-27 LAB — BASIC METABOLIC PANEL
Anion gap: 8 (ref 5–15)
BUN: <5 mg/dL — ABNORMAL LOW (ref 6–20)
CO2: 31 mmol/L (ref 22–32)
Calcium: 8.5 mg/dL — ABNORMAL LOW (ref 8.9–10.3)
Chloride: 94 mmol/L — ABNORMAL LOW (ref 98–111)
Creatinine, Ser: 0.83 mg/dL (ref 0.61–1.24)
GFR, Estimated: >60 mL/min (ref >60)
Glucose, Bld: 89 mg/dL (ref 70–99)
Potassium: 3 mmol/L — ABNORMAL LOW (ref 3.5–5.1)
Sodium: 133 mmol/L — ABNORMAL LOW (ref 135–145)

## 2022-06-27 LAB — MAGNESIUM: Magnesium: 1.6 mg/dL — ABNORMAL LOW (ref 1.7–2.4)

## 2022-06-27 MED ORDER — POTASSIUM CHLORIDE CRYS ER 20 MEQ PO TBCR
40.0000 meq | EXTENDED_RELEASE_TABLET | ORAL | Status: DC
  Filled 2022-06-27: qty 2

## 2022-06-27 MED ORDER — MAGNESIUM SULFATE 2 GM/50ML IV SOLN
2.0000 g | Freq: Once | INTRAVENOUS | Status: AC
  Administered 2022-06-27: 2 g via INTRAVENOUS
  Filled 2022-06-27: qty 50

## 2022-06-27 MED ORDER — POTASSIUM CHLORIDE 20 MEQ PO PACK
40.0000 meq | PACK | Freq: Two times a day (BID) | ORAL | Status: DC
  Administered 2022-06-27: 40 meq via ORAL
  Filled 2022-06-27 (×2): qty 2

## 2022-06-27 NOTE — Progress Notes (Signed)
Patient spit out medication on the floor and refused to take potassium medication at 0830. Patient states, "too big I can't take them". Patient educated on risks and benefits. MD notified. Call light within reach. Continue plan of care.

## 2022-06-27 NOTE — Plan of Care (Signed)
  Problem: Education: Goal: Knowledge of General Education information will improve Description: Including pain rating scale, medication(s)/side effects and non-pharmacologic comfort measures Outcome: Progressing   Problem: Health Behavior/Discharge Planning: Goal: Ability to manage health-related needs will improve Outcome: Progressing   Problem: Coping: Goal: Ability to adjust to condition or change in health will improve Outcome: Progressing   Problem: Nutritional: Goal: Maintenance of adequate nutrition will improve Outcome: Progressing

## 2022-06-27 NOTE — TOC Progression Note (Signed)
Transition of Care [facility]) - Progression Note    Patient Details  Name: Aristeo Hankerson MRN: [id number] Date of Birth: July 07, 1966  Transition of Care [facility]) CM/SW Contact  Joanne Chars, [place] Phone Number: 06/27/2022, 1:34 PM  Clinical Narrative:   CSW reached out to [facility] to see if they can reconsider pt for LOG placement.     Expected Discharge Plan: [place] Barriers to Discharge: Continued Medical Work up  Expected Discharge Plan and Services Expected Discharge Plan: [place] In-house Referral: Development worker, community Discharge Planning Services: CM Consult                                           Social Determinants of Health (SDOH) Interventions    Readmission Risk Interventions     No data to display

## 2022-06-27 NOTE — Progress Notes (Signed)
Progress Note    Prospero Mahnke  MWU:[id number] DOB: 1965/10/26  DOA: 05/26/2022 PCP: [name], NP      Brief Narrative:    Medical records reviewed and are as summarized below:  Tilford Giller is a 56 y.o. male from [place] who peaks [place], with history of diabetes type 2, hypertension, lumbar disc degenerative disease with spinal stenosis/ claudication, T12 vertebral compression fracture, mild cognitive impairment who was admitted for the management of uncontrolled low back pain, ambulatory difficulty due to severe lumbar spine stenosis and T12 compression fracture.  Orthopedics, neurosurgery evaluated the patient and recommended TLSO brace, pain control and outpatient follow-up.  PT recommending acute inpatient rehab.  Difficult placement due to lack of insurance.LLOS. IR performed kyphoplasty for T12 compression fracture on 06/18/2022        Assessment/Plan:   Principal Problem:   Pyogenic arthritis of right shoulder region [facility]) Active Problems:   Spinal stenosis of lumbar region with neurogenic claudication   T12 compression fracture (HCC)   AKI (acute kidney injury) ([place])   Hyponatremia   Acute urinary retention   Type 2 diabetes mellitus without complication, without long-term current use of insulin (HCC)   Leukocytosis   Hypertension associated with diabetes ([place])   Iron deficiency anemia   Hyperlipidemia associated with type 2 diabetes mellitus (HCC)   Mild cognitive impairment   Thrush, oral   Right shoulder pain   Discitis of lumbar region   Chronic low back pain with bilateral sciatica   Nutrition Problem: Increased nutrient needs Etiology: post-op healing  Signs/Symptoms: other (comment) (Compression fracture)   Body mass index is 25.1 kg/m.   Severe back pain/lumbar spinal stenosis/T12 compression fracture: Presented with severe low back pain, ambulatory dysfunction.  MRI did not show any cord injury.  Neurosurgery consulted, no surgical  intervention recommended, recommended TLSO brace.  IR was also consulted for consideration of T12 kyphoplasty but this was canceled due to fever.  Requested for kyphoplasty again because he does not have any signs of sepsis. Patient eventually underwent kyphoplasty on 06/18/2022.  Discharge to SNF was recommended by PT.  Patient does not have insurance and this is going to be challenging.  He does not have much support at home.    Fever/leukocytosis: Unclear source. Now resolved.  Chest x-ray did not show any pneumonia.  COVID-negative.  MRI of the spine on 9/18 was concerning for infection/discitis at L4 level.  Disc Aspiration was completed on 9/25 by IR.  He started on IV vancomycin, ceftriaxone.  Culture from spinal aspiration negative.  Patient had ongoing pain on the right shoulder, MRI was done which was suggestive of septic arthritis.  Orthopedics aspirated the shoulder on 9/27 with no fluid.  IR aspirated right shoulder on 9/29 without finding of any infection.  ID was also consulted, antibiotics discontinued.  Currently off antibiotics.    Right shoulder pain: Persistent.  Continue local anesthetics, oral pain medications.  Consideration of steroid injection in the shoulder as an outpatient.  Continue pain management.   Abdominal discomfort/microcytic  iron deficiency anemia:  Abdominal pain seems to be improving.  Stools are dark but it is likely from iron pills. Continue iron supplementation.  Iron studies showed low iron as per 9/29.  No evidence of active bleeding.  Unaware about any hematochezia or melena. Transfused with a unit of PRBC on 9/28.  Continue monitoring,Hb in the range of 7-8.  Also he was complaining of abdominal pain so abdominal CT was obtained  on 06/17/2022 which did not show any acute findings, distal CBD was slightly dilated but no stones or obstruction or lesion.  He was again complaining of left lower quad abdominal pain and had tenderness and mention of not passing gas  so abdominal x-ray was obtained which did not show any SBO or ileus.   Repeat hemoglobin today stable at 7.9.  Acute urinary retention/AKI: Felt to be secondary to back pain, mobility.  Started on Flomax.  Renal function is stabilized.  Failed voiding trial twice so Foley in place. we recommend follow-up with urology as an outpatient.  Continue Flomax.   Constipation: Resolved.  Use laxatives as needed.   Vitamin D deficiency: Continue supplementation   Hyponatremia, chronic and asymptomatic.   Hypokalemia and hypomagnesemia: Replete potassium with oral potassium chloride and magnesium with IV magnesium sulfate.  Sinus tachycardia: Improved.  Continue Toprol    Other comorbidities include type II DM (hemoglobin A1c of 7.4), hypertension, hyperlipidemia, mild cognitive impairment      Diet Order             Diet Heart Room service appropriate? Yes; Fluid consistency: Thin  Diet effective now                            Consultants: Interventional radiologist Infectious disease specialist  Procedures: Kyphoplasty for T12 compression fracture on 06/18/2022 Fluoroscopy guided L4-5 disc aspiration    Medications:    atorvastatin  40 mg Oral Daily   Chlorhexidine Gluconate Cloth  6 each Topical Daily   cholecalciferol  1,000 Units Oral Daily   donepezil  5 mg Oral QHS   feeding supplement (GLUCERNA SHAKE)  237 mL Oral TID BM   ferrous sulfate  325 mg Oral Q breakfast   gabapentin  200 mg Oral TID   insulin aspart  0-5 Units Subcutaneous QHS   insulin aspart  0-6 Units Subcutaneous TID WC   lidocaine  1 patch Transdermal Q24H   lidocaine  1 patch Transdermal Q24H   magnesium gluconate  500 mg Oral BID   metoprolol succinate  12.5 mg Oral Daily   morphine  15 mg Oral Q12H   nystatin  5 mL Oral QID   pantoprazole  40 mg Oral Daily   polyethylene glycol  17 g Oral BID   potassium chloride  40 mEq Oral BID   senna-docusate  1 tablet Oral BID   tamsulosin   0.4 mg Oral Daily   Continuous Infusions:   Anti-infectives (From admission, onward)    Start     Dose/Rate Route Frequency Ordered Stop   06/18/22 0839  ceFAZolin (ANCEF) IVPB 2g/100 mL premix        over 30 Minutes Intravenous Continuous PRN 06/18/22 0852 06/18/22 0839   06/18/22 0500  ceFAZolin (ANCEF) IVPB 2g/100 mL premix        2 g 200 mL/hr over 30 Minutes Intravenous To Short Stay 06/17/22 1338 06/19/22 0500   06/03/22 0700  vancomycin (VANCOREADY) IVPB 750 mg/150 mL  Status:  Discontinued        750 mg 150 mL/hr over 60 Minutes Intravenous Every 12 hours 06/02/22 1848 06/06/22 1429   06/02/22 1945  vancomycin (VANCOCIN) IVPB 1000 mg/200 mL premix        1,000 mg 200 mL/hr over 60 Minutes Intravenous  Once 06/02/22 1847 06/02/22 2109   06/02/22 1930  cefTRIAXone (ROCEPHIN) 2 g in sodium chloride 0.9 % 100 mL IVPB  Status:  Discontinued        2 g 200 mL/hr over 30 Minutes Intravenous Every 24 hours 06/02/22 1835 06/06/22 1429   06/02/22 0000  ceFAZolin (ANCEF) IVPB 2g/100 mL premix  Status:  Discontinued        2 g 200 mL/hr over 30 Minutes Intravenous To Radiology 05/30/22 1504 06/02/22 1835              Family Communication/Anticipated D/C date and plan/Code Status   DVT prophylaxis: SCDs Start: 05/26/22 2254     Code Status: Full Code  Family Communication: None Disposition Plan: To be determined but will probably go home with   Status is: Inpatient Remains inpatient appropriate because: Unsafe discharge plan       Subjective:   Interval events noted.  He complains of pain in the right shoulder and to follow-up as needed.  He still complains of pain in the right shoulder.  Objective:    Vitals:   06/26/22 2148 06/27/22 0820 06/27/22 0925 06/27/22 0928  BP: 108/65 113/72 108/78   Pulse: 89 90 98   Resp: 18 16 15    Temp: 98.5 F (36.9 C) 98.4 F (36.9 C) 98 F (36.7 C)   TempSrc: Oral Oral Oral   SpO2: 98% 91% (!) 84% 95%  Weight:       Height:       No data found.   Intake/Output Summary (Last 24 hours) at 06/27/2022 1228 Last data filed at 06/27/2022 0848 Gross per 24 hour  Intake 240 ml  Output 900 ml  Net -660 ml   Filed Weights   06/07/22 0500 06/24/22 1407 06/25/22 0500  Weight: 61.7 kg 57.4 kg 58.3 kg    Exam:  GEN: NAD SKIN: No rash EYES: EOMI ENT: MMM CV: RRR PULM: CTA B ABD: soft, ND, NT, +BS CNS: AAO x 3, non focal EXT: No edema or tenderness         Data Reviewed:   I have personally reviewed following labs and imaging studies:  Labs: Labs show the following:   Basic Metabolic Panel: Recent Labs  Lab 06/27/22 0322  NA 133*  K 3.0*  CL 94*  CO2 31  GLUCOSE 89  BUN <5*  CREATININE 0.83  CALCIUM 8.5*  MG 1.6*   GFR Estimated Creatinine Clearance: 69.4 mL/min (by C-G formula based on SCr of 0.83 mg/dL). Liver Function Tests: No results for input(s): "AST", "ALT", "ALKPHOS", "BILITOT", "PROT", "ALBUMIN" in the last 168 hours. No results for input(s): "LIPASE", "AMYLASE" in the last 168 hours. No results for input(s): "AMMONIA" in the last 168 hours. Coagulation profile No results for input(s): "INR", "PROTIME" in the last 168 hours.  CBC: Recent Labs  Lab 06/27/22 0322  WBC 8.6  NEUTROABS 5.2  HGB 7.9*  HCT 23.5*  MCV 68.5*  PLT 498*   Cardiac Enzymes: No results for input(s): "CKTOTAL", "CKMB", "CKMBINDEX", "TROPONINI" in the last 168 hours. BNP (last 3 results) No results for input(s): "PROBNP" in the last 8760 hours. CBG: Recent Labs  Lab 06/26/22 1645 06/26/22 2055 06/27/22 0842 06/27/22 0926 06/27/22 1131  GLUCAP 99 133* 80 87 105*   D-Dimer: No results for input(s): "DDIMER" in the last 72 hours. Hgb A1c: No results for input(s): "HGBA1C" in the last 72 hours. Lipid Profile: No results for input(s): "CHOL", "HDL", "LDLCALC", "TRIG", "CHOLHDL", "LDLDIRECT" in the last 72 hours. Thyroid function studies: No results for input(s): "TSH",  "T4TOTAL", "T3FREE", "THYROIDAB" in the last 72 hours.  Invalid input(s): "FREET3" Anemia work up: No results for input(s): "VITAMINB12", "FOLATE", "FERRITIN", "TIBC", "IRON", "RETICCTPCT" in the last 72 hours. Sepsis Labs: Recent Labs  Lab 06/27/22 0322  WBC 8.6    Microbiology No results found for this or any previous visit (from the past 240 hour(s)).  Procedures and diagnostic studies:  No results found.             LOS: 32 days   Kameren Pargas  [employer] loss adjuster on [email or web address]. If 7PM-7AM, please contact night-coverage at [email or web address]     06/27/2022, 12:28 PM

## 2022-06-27 NOTE — Progress Notes (Signed)
Inpatient Rehab Admissions Coordinator:  Spoke with pt's congregational nurse [name]. She informed AC that pt does not have family or friends support. She also noted that pt would not have support from roommate who also has medical needs. Due to pt's cognitive deficits, do not feel pt would be able to reach independent level in order to return safely home alone. Other rehab options should be pursued.   Gayland Curry, [place], [place] Admissions Coordinator [phone]

## 2022-06-28 DIAGNOSIS — M5442 Lumbago with sciatica, left side: Secondary | ICD-10-CM

## 2022-06-28 DIAGNOSIS — I152 Hypertension secondary to endocrine disorders: Secondary | ICD-10-CM

## 2022-06-28 DIAGNOSIS — E871 Hypo-osmolality and hyponatremia: Secondary | ICD-10-CM

## 2022-06-28 DIAGNOSIS — E1159 Type 2 diabetes mellitus with other circulatory complications: Secondary | ICD-10-CM

## 2022-06-28 DIAGNOSIS — M5441 Lumbago with sciatica, right side: Secondary | ICD-10-CM

## 2022-06-28 DIAGNOSIS — G8929 Other chronic pain: Secondary | ICD-10-CM

## 2022-06-28 LAB — GLUCOSE, CAPILLARY
Glucose-Capillary: 98 mg/dL (ref 70–99)
Glucose-Capillary: 97 mg/dL (ref 70–99)
Glucose-Capillary: 84 mg/dL (ref 70–99)

## 2022-06-28 LAB — MAGNESIUM: Magnesium: 1.7 mg/dL (ref 1.7–2.4)

## 2022-06-28 LAB — POTASSIUM: Potassium: 3.2 mmol/L — ABNORMAL LOW (ref 3.5–5.1)

## 2022-06-28 MED ORDER — POTASSIUM CHLORIDE CRYS ER 20 MEQ PO TBCR
40.0000 meq | EXTENDED_RELEASE_TABLET | Freq: Two times a day (BID) | ORAL | Status: DC
  Administered 2022-06-28: 40 meq via ORAL
  Filled 2022-06-28: qty 2

## 2022-06-28 MED ORDER — POTASSIUM CHLORIDE 20 MEQ PO PACK
40.0000 meq | PACK | Freq: Two times a day (BID) | ORAL | Status: AC
  Administered 2022-06-28: 40 meq via ORAL
  Filled 2022-06-28: qty 2

## 2022-06-28 MED ORDER — LIDOCAINE VISCOUS HCL 2 % MT SOLN
15.0000 mL | OROMUCOSAL | Status: DC | PRN
  Administered 2022-07-24 – 2022-07-27 (×2): 15 mL via OROMUCOSAL
  Filled 2022-06-28 (×2): qty 15

## 2022-06-28 MED ORDER — FLUCONAZOLE 100MG IVPB
100.0000 mg | INTRAVENOUS | Status: AC
  Administered 2022-06-28 – 2022-06-30 (×3): 100 mg via INTRAVENOUS
  Filled 2022-06-28 (×4): qty 50

## 2022-06-28 NOTE — Progress Notes (Signed)
Patient waited until meds were opened and in med cup to refuse taking medications.  He stated "no, no, no, I don't want!"  I attempted several times but got similar response.  Wasted the MS Contin and Gabapentin with another RN in pyxis.

## 2022-06-28 NOTE — Progress Notes (Signed)
[facility] PROGRESS NOTE    Progress Note  Wayne Stokes  HAL:[id number] DOB: [birth year] DOA: 05/26/2022 PCP: [name], NP     Brief Narrative:   Wayne Stokes is an 56 y.o. male from [place], diabetes mellitus type 2, essential hypertension with lumbar disc disease with spinal stenosis, T12 compression fracture and mild cognitive impairment admitted for uncontrolled back pain and severe spinal lumbar stenosis orthopedic, neurosurgery evaluated the patient and recommended TLC L for pain control and outpatient follow-up.  Physical therapy recommended inpatient rehab difficult to place due to lack of insurance. IR was consulted to perform kyphoplasty of T12 compression fracture on 06/18/2022    Assessment/Plan:   Severe back pain/lumbar spinal stenosis and T12 compression fraction: MRI did not show any cord injury neurosurgery was consulted recommended no surgical intervention the recommended DLCO. IR was consulted to perform kyphoplasty on 06/18/2022. Physical therapy evaluated the patient recommended skilled nursing facility is a challenge because he does not have insurance.  Leukocytosis/fever: Of unclear source now resolved. He was initially started on IV vancomycin and Rocephin Work-up was unrevealing, there is likely reactive due to T12 compression fracture MRI was done of the shoulder there was question septic arthritis ,IR aspirated again on 06/06/2022 that showed no signs of infection.  ID was consulted and antibiotics were discontinued.  Right shoulder pain: Continue oral narcotics follow-up with orthopedic surgery as an outpatient.  Abdominal discomfort/microcytic anemia/iron deficiency anemia: CT scan of the abdomen pelvis did not show any acute findings. Hemoglobin has remained relatively stable.  Acute urinary retention/acute kidney injury: Felt to be secondary to back pain started on Flomax renal function stabilized after Foley was placed, has  failed voiding trial twice. We will need to follow-up with urology as an outpatient.  Constipation: Resolved.  Vitamin D deficiency: Continue supplementation.  Hyponatremia: Likely chronic.  Hypokalemia/hypomagnesemia: Replete orally now resolved continue to monitor intermittently.  Sinus tachycardia: Resolved with metoprolol.  Diabetes mellitus type 2: Currently on sliding scale insulin has not required insulin for over 5 days. Stop checking CBGs.  Essential hypertension: Well-controlled on metoprolol.  Hyperlipidemia: Continue statins.    DVT prophylaxis: lovenox Family Communication:none Status is: Inpatient Remains inpatient appropriate because: Awaiting skilled nursing select placement.    Code Status:     Code Status Orders  (From admission, onward)           Start     Ordered   05/26/22 2254  Full code  Continuous        05/26/22 2254           Code Status History     This patient has a current code status but no historical code status.         IV Access:   Peripheral IV   Procedures and diagnostic studies:   No results found.   Medical Consultants:   None.   Subjective:    [name] no complaints today.  Objective:    Vitals:   06/27/22 0928 06/27/22 1437 06/27/22 2207 06/28/22 0700  BP:  121/86 131/82 122/83  Pulse:  96 91 84  Resp:  14 16 18   Temp:  (!) 97.5 F (36.4 C) 98.7 F (37.1 C) 98.4 F (36.9 C)  TempSrc:  Oral Oral Axillary  SpO2: 95% 92% 96% 94%  Weight:      Height:       SpO2: 94 % O2 Flow Rate (L/min): 2 L/min   Intake/Output Summary (Last 24 hours) at  06/28/2022 0802 Last data filed at 06/27/2022 1800 Gross per 24 hour  Intake 840 ml  Output --  Net 840 ml   Filed Weights   06/07/22 0500 06/24/22 1407 06/25/22 0500  Weight: 61.7 kg 57.4 kg 58.3 kg    Exam: General exam: In no acute distress. Respiratory system: Good air movement and clear to auscultation. Cardiovascular  system: S1 & S2 heard, RRR. No JVD. Gastrointestinal system: Abdomen is nondistended, soft and nontender.  Extremities: No pedal edema. Skin: No rashes, lesions or ulcers Psychiatry: Judgement and insight appear normal. Mood & affect appropriate.    Data Reviewed:    Labs: Basic Metabolic Panel: Recent Labs  Lab 06/27/22 0322 06/28/22 0300  NA 133*  --   K 3.0* 3.2*  CL 94*  --   CO2 31  --   GLUCOSE 89  --   BUN <5*  --   CREATININE 0.83  --   CALCIUM 8.5*  --   MG 1.6* 1.7   GFR Estimated Creatinine Clearance: 69.4 mL/min (by C-G formula based on SCr of 0.83 mg/dL). Liver Function Tests: No results for input(s): "AST", "ALT", "ALKPHOS", "BILITOT", "PROT", "ALBUMIN" in the last 168 hours. No results for input(s): "LIPASE", "AMYLASE" in the last 168 hours. No results for input(s): "AMMONIA" in the last 168 hours. Coagulation profile No results for input(s): "INR", "PROTIME" in the last 168 hours. COVID-19 Labs  No results for input(s): "DDIMER", "FERRITIN", "LDH", "CRP" in the last 72 hours.  Lab Results  Component Value Date   SARSCOV2NAA NEGATIVE 06/01/2022   SARSCOV2NAA NEGATIVE 05/07/2022    CBC: Recent Labs  Lab 06/27/22 0322  WBC 8.6  NEUTROABS 5.2  HGB 7.9*  HCT 23.5*  MCV 68.5*  PLT 498*   Cardiac Enzymes: No results for input(s): "CKTOTAL", "CKMB", "CKMBINDEX", "TROPONINI" in the last 168 hours. BNP (last 3 results) No results for input(s): "PROBNP" in the last 8760 hours. CBG: Recent Labs  Lab 06/27/22 0842 06/27/22 0926 06/27/22 1131 06/27/22 1622 06/27/22 1951  GLUCAP 80 87 105* 146* 149*   D-Dimer: No results for input(s): "DDIMER" in the last 72 hours. Hgb A1c: No results for input(s): "HGBA1C" in the last 72 hours. Lipid Profile: No results for input(s): "CHOL", "HDL", "LDLCALC", "TRIG", "CHOLHDL", "LDLDIRECT" in the last 72 hours. Thyroid function studies: No results for input(s): "TSH", "T4TOTAL", "T3FREE", "THYROIDAB" in  the last 72 hours.  Invalid input(s): "FREET3" Anemia work up: No results for input(s): "VITAMINB12", "FOLATE", "FERRITIN", "TIBC", "IRON", "RETICCTPCT" in the last 72 hours. Sepsis Labs: Recent Labs  Lab 06/27/22 0322  WBC 8.6   Microbiology No results found for this or any previous visit (from the past 240 hour(s)).   Medications:    atorvastatin  40 mg Oral Daily   Chlorhexidine Gluconate Cloth  6 each Topical Daily   cholecalciferol  1,000 Units Oral Daily   donepezil  5 mg Oral QHS   feeding supplement (GLUCERNA SHAKE)  237 mL Oral TID BM   ferrous sulfate  325 mg Oral Q breakfast   gabapentin  200 mg Oral TID   insulin aspart  0-5 Units Subcutaneous QHS   insulin aspart  0-6 Units Subcutaneous TID WC   lidocaine  1 patch Transdermal Q24H   lidocaine  1 patch Transdermal Q24H   magnesium gluconate  500 mg Oral BID   metoprolol succinate  12.5 mg Oral Daily   morphine  15 mg Oral Q12H   nystatin  5 mL Oral QID  pantoprazole  40 mg Oral Daily   polyethylene glycol  17 g Oral BID   potassium chloride  40 mEq Oral BID   senna-docusate  1 tablet Oral BID   tamsulosin  0.4 mg Oral Daily   Continuous Infusions:    LOS: 33 days   Charlynne Cousins  [facility]  06/28/2022, 8:02 AM

## 2022-06-29 LAB — BASIC METABOLIC PANEL
Anion gap: 9 (ref 5–15)
BUN: 6 mg/dL (ref 6–20)
CO2: 27 mmol/L (ref 22–32)
Calcium: 8.5 mg/dL — ABNORMAL LOW (ref 8.9–10.3)
Chloride: 98 mmol/L (ref 98–111)
Creatinine, Ser: 0.86 mg/dL (ref 0.61–1.24)
GFR, Estimated: >60 mL/min (ref >60)
Glucose, Bld: 82 mg/dL (ref 70–99)
Potassium: 3.9 mmol/L (ref 3.5–5.1)
Sodium: 134 mmol/L — ABNORMAL LOW (ref 135–145)

## 2022-06-29 LAB — GLUCOSE, CAPILLARY
Glucose-Capillary: 64 mg/dL — ABNORMAL LOW (ref 70–99)
Glucose-Capillary: 69 mg/dL — ABNORMAL LOW (ref 70–99)
Glucose-Capillary: 72 mg/dL (ref 70–99)

## 2022-06-29 NOTE — Plan of Care (Signed)
  Problem: Pain Managment: Goal: General experience of comfort will improve Outcome: Progressing   Problem: Safety: Goal: Ability to remain free from injury will improve Outcome: Progressing   Problem: Skin Integrity: Goal: Risk for impaired skin integrity will decrease Outcome: Progressing   

## 2022-06-29 NOTE — TOC Progression Note (Signed)
Transition of Care [facility]) - Progression Note    Patient Details  Name: Wayne Stokes MRN: [id number] Date of Birth: 10/26/1965  Transition of Care [facility]) CM/SW Contact  Emeterio Reeve, [place] Phone Number: 06/29/2022, 9:11 AM  Clinical Narrative:     Pt has no bed offers at this time.   Expected Discharge Plan: [place] Barriers to Discharge: Continued Medical Work up  Expected Discharge Plan and Services Expected Discharge Plan: [place] In-house Referral: Development worker, community Discharge Planning Services: CM Consult                                           Social Determinants of Health (SDOH) Interventions    Readmission Risk Interventions     No data to display         Emeterio Reeve, [place] Social Worker

## 2022-06-29 NOTE — Progress Notes (Signed)
Hypoglycemic Event  CBG: 64  Treatment: 4 oz juice/soda  Symptoms: None  Follow-up CBG Time: 0930 CBG Result: 72  Possible Reasons for Event: Unknown  Comments/MD notified: Charlynne Cousins, MD     Racheal Patches

## 2022-06-29 NOTE — Progress Notes (Signed)
[facility] PROGRESS NOTE    Progress Note  Wayne Stokes  LZJ:[id number] DOB: [birth year] DOA: 05/26/2022 PCP: [name], NP     Brief Narrative:   Wayne Stokes is an 56 y.o. male from [place], diabetes mellitus type 2, essential hypertension with lumbar disc disease with spinal stenosis, T12 compression fracture and mild cognitive impairment admitted for uncontrolled back pain and severe spinal lumbar stenosis orthopedic, neurosurgery evaluated the patient and recommended TLC L for pain control and outpatient follow-up.  Physical therapy recommended inpatient rehab difficult to place due to lack of insurance. IR was consulted to perform kyphoplasty of T12 compression fracture on 06/18/2022    Assessment/Plan:   Severe back pain/lumbar spinal stenosis and T12 compression fraction: MRI did not show any cord injury neurosurgery was consulted recommended no surgical intervention the recommended DLCO. IR was consulted to perform kyphoplasty on 06/18/2022. Physical therapy evaluated the patient recommended skilled nursing facility is a challenge because he does not have insurance.  Leukocytosis/fever: Of unclear source now resolved. He was initially started on IV vancomycin and Rocephin Work-up was unrevealing, there is likely reactive due to T12 compression fracture MRI was done of the shoulder there was question septic arthritis ,IR aspirated again on 06/06/2022 that showed no signs of infection.  ID was consulted and antibiotics were discontinued.  Right shoulder pain: Continue oral narcotics follow-up with orthopedic surgery as an outpatient.  Abdominal discomfort/microcytic anemia/iron deficiency anemia: CT scan of the abdomen pelvis did not show any acute findings. Hemoglobin has remained relatively stable.  Acute urinary retention/acute kidney injury: Felt to be secondary to back pain started on Flomax renal function stabilized after Foley was placed, has  failed voiding trial twice. We will need to follow-up with urology as an outpatient.  Constipation: Resolved.  Vitamin D deficiency: Continue supplementation.  Hyponatremia: Likely chronic.  Hypokalemia/hypomagnesemia: Replete orally now resolved continue to monitor intermittently.  Sinus tachycardia: Resolved with metoprolol.  Diabetes mellitus type 2: Currently on sliding scale insulin has not required insulin for over 5 days. Stop checking CBGs.  Essential hypertension: Well-controlled on metoprolol.  Hyperlipidemia: Continue statins.    DVT prophylaxis: lovenox Family Communication:none Status is: Inpatient Remains inpatient appropriate because: Awaiting skilled nursing select placement.    Code Status:     Code Status Orders  (From admission, onward)           Start     Ordered   05/26/22 2254  Full code  Continuous        05/26/22 2254           Code Status History     This patient has a current code status but no historical code status.         IV Access:   Peripheral IV   Procedures and diagnostic studies:   No results found.   Medical Consultants:   None.   Subjective:    Wayne Stokes no complaints today.  Objective:    Vitals:   06/28/22 1031 06/28/22 1953 06/29/22 0415 06/29/22 0835  BP: 122/89 (!) 122/90 97/69 119/81  Pulse: 83 (!) 101 92 89  Resp: 16 18 18 18   Temp: 98.2 F (36.8 C) 98 F (36.7 C) 97.7 F (36.5 C) 98.8 F (37.1 C)  TempSrc:      SpO2: 97% 93% 92%   Weight:      Height:       SpO2: 92 % O2 Flow Rate (L/min): 2 L/min   Intake/Output Summary (  Last 24 hours) at 06/29/2022 1004 Last data filed at 06/29/2022 0846 Gross per 24 hour  Intake --  Output 2150 ml  Net -2150 ml    Filed Weights   06/07/22 0500 06/24/22 1407 06/25/22 0500  Weight: 61.7 kg 57.4 kg 58.3 kg    Exam: General exam: In no acute distress. Respiratory system: Good air movement and clear to  auscultation. Cardiovascular system: S1 & S2 heard, RRR. No JVD. Gastrointestinal system: Abdomen is nondistended, soft and nontender.  Extremities: No pedal edema. Skin: No rashes, lesions or ulcers Psychiatry: Judgement and insight appear normal. Mood & affect appropriate.    Data Reviewed:    Labs: Basic Metabolic Panel: Recent Labs  Lab 06/27/22 0322 06/28/22 0300 06/29/22 0315  NA 133*  --  134*  K 3.0* 3.2* 3.9  CL 94*  --  98  CO2 31  --  27  GLUCOSE 89  --  82  BUN <5*  --  6  CREATININE 0.83  --  0.86  CALCIUM 8.5*  --  8.5*  MG 1.6* 1.7  --     GFR Estimated Creatinine Clearance: 67 mL/min (by C-G formula based on SCr of 0.86 mg/dL). Liver Function Tests: No results for input(s): "AST", "ALT", "ALKPHOS", "BILITOT", "PROT", "ALBUMIN" in the last 168 hours. No results for input(s): "LIPASE", "AMYLASE" in the last 168 hours. No results for input(s): "AMMONIA" in the last 168 hours. Coagulation profile No results for input(s): "INR", "PROTIME" in the last 168 hours. COVID-19 Labs  No results for input(s): "DDIMER", "FERRITIN", "LDH", "CRP" in the last 72 hours.  Lab Results  Component Value Date   SARSCOV2NAA NEGATIVE 06/01/2022   [place] NEGATIVE 05/07/2022    CBC: Recent Labs  Lab 06/27/22 0322  WBC 8.6  NEUTROABS 5.2  HGB 7.9*  HCT 23.5*  MCV 68.5*  PLT 498*    Cardiac Enzymes: No results for input(s): "CKTOTAL", "CKMB", "CKMBINDEX", "TROPONINI" in the last 168 hours. BNP (last 3 results) No results for input(s): "PROBNP" in the last 8760 hours. CBG: Recent Labs  Lab 06/28/22 1230 06/28/22 1550 06/29/22 0836 06/29/22 0929 06/29/22 0930  GLUCAP 97 84 64* 69* 72    D-Dimer: No results for input(s): "DDIMER" in the last 72 hours. Hgb A1c: No results for input(s): "HGBA1C" in the last 72 hours. Lipid Profile: No results for input(s): "CHOL", "HDL", "LDLCALC", "TRIG", "CHOLHDL", "LDLDIRECT" in the last 72 hours. Thyroid function  studies: No results for input(s): "TSH", "T4TOTAL", "T3FREE", "THYROIDAB" in the last 72 hours.  Invalid input(s): "FREET3" Anemia work up: No results for input(s): "VITAMINB12", "FOLATE", "FERRITIN", "TIBC", "IRON", "RETICCTPCT" in the last 72 hours. Sepsis Labs: Recent Labs  Lab 06/27/22 0322  WBC 8.6    Microbiology No results found for this or any previous visit (from the past 240 hour(s)).   Medications:    atorvastatin  40 mg Oral Daily   Chlorhexidine Gluconate Cloth  6 each Topical Daily   cholecalciferol  1,000 Units Oral Daily   donepezil  5 mg Oral QHS   feeding supplement (GLUCERNA SHAKE)  237 mL Oral TID BM   ferrous sulfate  325 mg Oral Q breakfast   gabapentin  200 mg Oral TID   lidocaine  1 patch Transdermal Q24H   lidocaine  1 patch Transdermal Q24H   magnesium gluconate  500 mg Oral BID   metoprolol succinate  12.5 mg Oral Daily   morphine  15 mg Oral Q12H   pantoprazole  40  mg Oral Daily   polyethylene glycol  17 g Oral BID   senna-docusate  1 tablet Oral BID   tamsulosin  0.4 mg Oral Daily   Continuous Infusions:  fluconazole (DIFLUCAN) IV 100 mg (06/28/22 1424)      LOS: 34 days   Marinda Elk  [facility]  06/29/2022, 10:04 AM

## 2022-06-30 LAB — HEMOGLOBIN A1C
Hgb A1c MFr Bld: 6.2 % — ABNORMAL HIGH (ref 4.8–5.6)
Mean Plasma Glucose: 131 mg/dL

## 2022-06-30 MED ORDER — FLUCONAZOLE 100 MG PO TABS
100.0000 mg | ORAL_TABLET | Freq: Every day | ORAL | Status: DC
  Administered 2022-07-01 – 2022-07-07 (×5): 100 mg via ORAL
  Filled 2022-06-30 (×7): qty 1

## 2022-06-30 NOTE — Plan of Care (Signed)
  Problem: Health Behavior/Discharge Planning: Goal: Ability to manage health-related needs will improve Outcome: Adequate for Discharge   Problem: Clinical Measurements: Goal: Ability to maintain clinical measurements within normal limits will improve Outcome: Adequate for Discharge Goal: Diagnostic test results will improve Outcome: Adequate for Discharge   

## 2022-06-30 NOTE — Progress Notes (Signed)
Mobility Specialist Progress Note   06/30/22 1440  Mobility  Activity Transferred to/from [facility];Transferred from chair to bed  Level of Assistance Minimal assist, patient does 75% or more  Assistive Device Front wheel walker  Distance Ambulated (ft) 6 ft  Activity Response Tolerated well  $Mobility charge 1 Mobility   Received in chair requesting to get back to bed, transferred to [facility] d/t sudden urgency but BM unsuccessful. Able to transfer BTB w/o fault and all needs met. Pt unable to doff brace post mobility.   Holland Falling Mobility Specialist Acute Rehab Office:  [phone]

## 2022-06-30 NOTE — Progress Notes (Signed)
[facility] PROGRESS NOTE    Progress Note  Armando Bukhari  OJJ:[id number] DOB: [birth year] DOA: 05/26/2022 PCP: [name], NP     Brief Narrative:   Rawleigh Vetrano is an 56 y.o. male from [place], diabetes mellitus type 2, essential hypertension with lumbar disc disease with spinal stenosis, T12 compression fracture and mild cognitive impairment admitted for uncontrolled back pain and severe spinal lumbar stenosis orthopedic, neurosurgery evaluated the patient and recommended TLC L for pain control and outpatient follow-up.  Physical therapy recommended inpatient rehab difficult to place due to lack of insurance. IR was consulted to perform kyphoplasty of T12 compression fracture on 06/18/2022    Assessment/Plan:   Severe back pain/lumbar spinal stenosis and T12 compression fraction: MRI did not show any cord injury neurosurgery was consulted recommended no surgical intervention the recommended DLCO. IR was consulted to perform kyphoplasty on 06/18/2022. Physical therapy evaluated the patient recommended skilled nursing facility is a challenge because he does not have insurance.  Leukocytosis/fever: Of unclear source now resolved. He was initially started on IV vancomycin and Rocephin Work-up was unrevealing, there is likely reactive due to T12 compression fracture MRI was done of the shoulder there was question septic arthritis ,IR aspirated again on 06/06/2022 that showed no signs of infection.  ID was consulted and antibiotics were discontinued.  Right shoulder pain: Continue oral narcotics follow-up with orthopedic surgery as an outpatient.  Abdominal discomfort/microcytic anemia/iron deficiency anemia: CT scan of the abdomen pelvis did not show any acute findings. Hemoglobin has remained relatively stable.  Acute urinary retention/acute kidney injury: Felt to be secondary to back pain started on Flomax renal function stabilized after Foley was placed, has  failed voiding trial twice. We will need to follow-up with urology as an outpatient.  Constipation: Resolved.  Vitamin D deficiency: Continue supplementation.  Hyponatremia: Likely chronic.  Hypokalemia/hypomagnesemia: Replete orally now resolved continue to monitor intermittently.  Sinus tachycardia: Resolved with metoprolol.  Diabetes mellitus type 2: Currently on sliding scale insulin has not required insulin for over 5 days. Stop checking CBGs.  Essential hypertension: Well-controlled on metoprolol.  Hyperlipidemia: Continue statins.    DVT prophylaxis: lovenox Family Communication:none Status is: Inpatient Remains inpatient appropriate because: Awaiting skilled nursing select placement.    Code Status:     Code Status Orders  (From admission, onward)           Start     Ordered   05/26/22 2254  Full code  Continuous        05/26/22 2254           Code Status History     This patient has a current code status but no historical code status.         IV Access:   Peripheral IV   Procedures and diagnostic studies:   No results found.   Medical Consultants:   None.   Subjective:    Romero Lerew no complaints today.  Objective:    Vitals:   06/29/22 0415 06/29/22 0835 06/29/22 1748 06/29/22 2211  BP: 97/69 119/81 105/70 (!) 136/103  Pulse: 92 89 100 93  Resp: 18 18 19 14   Temp: 97.7 F (36.5 C) 98.8 F (37.1 C) 97.9 F (36.6 C) 97.7 F (36.5 C)  TempSrc:   Oral   SpO2: 92%  96% 96%  Weight:      Height:       SpO2: 96 % O2 Flow Rate (L/min): 2 L/min   Intake/Output Summary (Last  24 hours) at 06/30/2022 0810 Last data filed at 06/29/2022 1748 Gross per 24 hour  Intake 346.85 ml  Output 1050 ml  Net -703.15 ml    Filed Weights   06/07/22 0500 06/24/22 1407 06/25/22 0500  Weight: 61.7 kg 57.4 kg 58.3 kg    Exam: General exam: In no acute distress. Respiratory system: Good air movement and clear to  auscultation. Cardiovascular system: S1 & S2 heard, RRR. No JVD. Gastrointestinal system: Abdomen is nondistended, soft and nontender.  Extremities: No pedal edema. Skin: No rashes, lesions or ulcers Psychiatry: Judgement and insight appear normal. Mood & affect appropriate.    Data Reviewed:    Labs: Basic Metabolic Panel: Recent Labs  Lab 06/27/22 0322 06/28/22 0300 06/29/22 0315  NA 133*  --  134*  K 3.0* 3.2* 3.9  CL 94*  --  98  CO2 31  --  27  GLUCOSE 89  --  82  BUN <5*  --  6  CREATININE 0.83  --  0.86  CALCIUM 8.5*  --  8.5*  MG 1.6* 1.7  --     GFR Estimated Creatinine Clearance: 67 mL/min (by C-G formula based on SCr of 0.86 mg/dL). Liver Function Tests: No results for input(s): "AST", "ALT", "ALKPHOS", "BILITOT", "PROT", "ALBUMIN" in the last 168 hours. No results for input(s): "LIPASE", "AMYLASE" in the last 168 hours. No results for input(s): "AMMONIA" in the last 168 hours. Coagulation profile No results for input(s): "INR", "PROTIME" in the last 168 hours. COVID-19 Labs  No results for input(s): "DDIMER", "FERRITIN", "LDH", "CRP" in the last 72 hours.  Lab Results  Component Value Date   SARSCOV2NAA NEGATIVE 06/01/2022   SARSCOV2NAA NEGATIVE 05/07/2022    CBC: Recent Labs  Lab 06/27/22 0322  WBC 8.6  NEUTROABS 5.2  HGB 7.9*  HCT 23.5*  MCV 68.5*  PLT 498*    Cardiac Enzymes: No results for input(s): "CKTOTAL", "CKMB", "CKMBINDEX", "TROPONINI" in the last 168 hours. BNP (last 3 results) No results for input(s): "PROBNP" in the last 8760 hours. CBG: Recent Labs  Lab 06/28/22 1230 06/28/22 1550 06/29/22 0836 06/29/22 0929 06/29/22 0930  GLUCAP 97 84 64* 69* 72    D-Dimer: No results for input(s): "DDIMER" in the last 72 hours. Hgb A1c: Recent Labs    06/28/22 0300  HGBA1C 6.2*   Lipid Profile: No results for input(s): "CHOL", "HDL", "LDLCALC", "TRIG", "CHOLHDL", "LDLDIRECT" in the last 72 hours. Thyroid function  studies: No results for input(s): "TSH", "T4TOTAL", "T3FREE", "THYROIDAB" in the last 72 hours.  Invalid input(s): "FREET3" Anemia work up: No results for input(s): "VITAMINB12", "FOLATE", "FERRITIN", "TIBC", "IRON", "RETICCTPCT" in the last 72 hours. Sepsis Labs: Recent Labs  Lab 06/27/22 0322  WBC 8.6    Microbiology No results found for this or any previous visit (from the past 240 hour(s)).   Medications:    atorvastatin  40 mg Oral Daily   Chlorhexidine Gluconate Cloth  6 each Topical Daily   cholecalciferol  1,000 Units Oral Daily   donepezil  5 mg Oral QHS   feeding supplement (GLUCERNA SHAKE)  237 mL Oral TID BM   ferrous sulfate  325 mg Oral Q breakfast   gabapentin  200 mg Oral TID   lidocaine  1 patch Transdermal Q24H   lidocaine  1 patch Transdermal Q24H   magnesium gluconate  500 mg Oral BID   metoprolol succinate  12.5 mg Oral Daily   morphine  15 mg Oral Q12H   pantoprazole  40 mg Oral Daily   polyethylene glycol  17 g Oral BID   senna-docusate  1 tablet Oral BID   tamsulosin  0.4 mg Oral Daily   Continuous Infusions:  fluconazole (DIFLUCAN) IV 100 mg (06/29/22 1200)      LOS: 35 days   Charlynne Cousins  [facility]  06/30/2022, 8:10 AM

## 2022-06-30 NOTE — Plan of Care (Signed)

## 2022-06-30 NOTE — TOC Progression Note (Signed)
Transition of Care [facility]) - Progression Note    Patient Details  Name: Capers Hagmann MRN: [id number] Date of Birth: 12/19/1965  Transition of Care [facility]) CM/SW Contact  Bartholomew Crews, RN Phone Number: [phone] 06/30/2022, 3:07 PM  Clinical Narrative:     Damaris Schooner with [employer] at local [place] [phone] - patient is a client of theirs. The [facility] has been following him for over a year including over the last month while hospitalized. They do not  have any housing resources to support him, but do continue to visit him. TOC following for transition needs.   Expected Discharge Plan: [place] Barriers to Discharge: Continued Medical Work up  Expected Discharge Plan and Services Expected Discharge Plan: [place] In-house Referral: Development worker, community Discharge Planning Services: CM Consult                                           Social Determinants of Health (SDOH) Interventions    Readmission Risk Interventions     No data to display

## 2022-06-30 NOTE — Progress Notes (Signed)
Physical Therapy Treatment Patient Details Name: Wayne Stokes MRN: [id number] DOB: 1966-08-08 Today's Date: 06/30/2022   History of Present Illness 56 y.o. male presented 05/26/22 with c/o severe and progressively worsening low back pain that radiates into his left buttock and down his LLE. Imaging +acute T12 compression fracture and  L4/5 there is multifactorial severe spinal stenosis and severe bilateral foraminal stenosis. Neurosurgery consult with no acute surgical intervention needed. IR consulted for possible kyphoplasty bu twas delayed due to fever; S/p T12 KP with biopsy on 10/11. 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    PT Comments    Pt not oriented to time, stating month was December and unsure of year. He reports right shoulder and back pain; consistently moaning throughout session. He states he is "dying," during walking, but unable to specify further when questioned with interpreter. Pt ambulating limited distances with walker at a min assist level. Presents as a high fall risk based on history of falls and decreased safety awareness. In light of deficits and decreased caregiver support.     Recommendations for follow up therapy are one component of a multi-disciplinary discharge planning process, led by the attending physician.  Recommendations may be updated based on patient status, additional functional criteria and insurance authorization.  Follow Up Recommendations  Skilled nursing-short term rehab (<3 hours/day) Can patient physically be transported by private vehicle: Yes   Assistance Recommended at Discharge Frequent or constant Supervision/Assistance  Patient can return home with the following Assistance with cooking/housework;Assist for transportation;Help with stairs or ramp for entrance;A little help with walking and/or transfers;A little help with  bathing/dressing/bathroom   Equipment Recommendations  BSC/3in1;Rolling walker (2 wheels)    Recommendations for Other Services       Precautions / Restrictions Precautions Precautions: Fall;Back Required Braces or Orthoses: Spinal Brace Spinal Brace: Lumbar corset;Applied in sitting position Restrictions Weight Bearing Restrictions: No     Mobility  Bed Mobility Overal bed mobility: Needs Assistance Bed Mobility: Sidelying to Sit, Rolling Rolling: Supervision Sidelying to sit: Supervision       General bed mobility comments: Ultimately able to perform without physical assist, increased time    Transfers Overall transfer level: Needs assistance Equipment used: Rolling walker (2 wheels) Transfers: Sit to/from Stand Sit to Stand: Min guard, Min assist           General transfer comment: Min guard from edge of bed, minA from toilet height.    Ambulation/Gait Ambulation/Gait assistance: Min assist Gait Distance (Feet): 50 Feet (10", 40") Assistive device: Rolling walker (2 wheels) Gait Pattern/deviations: Step-through pattern, Decreased stride length, Trunk flexed Gait velocity: decreased     General Gait Details: Cues for walker proximity and environmental negotiation, light minA for balance   Stairs             Wheelchair Mobility    Modified Rankin (Stroke Patients Only)       Balance Overall balance assessment: Needs assistance, History of Falls Sitting-balance support: No upper extremity supported, Feet supported Sitting balance-Leahy Scale: Good     Standing balance support: Bilateral upper extremity supported, During functional activity Standing balance-Leahy Scale: Poor                              Cognition Arousal/Alertness: Awake/alert Behavior During Therapy: Flat affect Overall Cognitive Status: Impaired/Different from baseline Area of Impairment: Problem solving, Following  commands, Orientation                  Orientation Level: Disoriented to, Time     Following Commands: Follows one step commands with increased time Safety/Judgement: Decreased awareness of safety   Problem Solving: Requires verbal cues, Requires tactile cues General Comments: Pt oriented to self and place, not oriented to time stating month was December and unsure of year.        Exercises      General Comments        Pertinent Vitals/Pain Pain Assessment Pain Assessment: Faces Faces Pain Scale: Hurts even more Pain Location: R shoulder, back Pain Descriptors / Indicators: Moaning Pain Intervention(s): Monitored during session    Home Living                          Prior Function            PT Goals (current goals can now be found in the care plan section) Acute Rehab PT Goals Patient Stated Goal: did not state Potential to Achieve Goals: Fair Progress towards PT goals: Progressing toward goals    Frequency    Min 3X/week      PT Plan Current plan remains appropriate    Co-evaluation              AM-PAC PT "6 Clicks" Mobility   Outcome Measure  Help needed turning from your back to your side while in a flat bed without using bedrails?: A Little Help needed moving from lying on your back to sitting on the side of a flat bed without using bedrails?: A Little Help needed moving to and from a bed to a chair (including a wheelchair)?: A Little Help needed standing up from a chair using your arms (e.g., wheelchair or bedside chair)?: A Little Help needed to walk in hospital room?: A Little Help needed climbing 3-5 steps with a railing? : A Lot 6 Click Score: 17    End of Session Equipment Utilized During Treatment: Back brace Activity Tolerance: Patient tolerated treatment well Patient left: in chair;with call bell/phone within reach;with chair alarm set Nurse Communication: Mobility status PT Visit Diagnosis: Other abnormalities of gait and mobility (R26.89);Muscle  weakness (generalized) (M62.81)     Time: 4315-4008 PT Time Calculation (min) (ACUTE ONLY): 41 min  Charges:  $Gait Training: 8-22 mins $Therapeutic Activity: 23-37 mins                     Lillia Pauls, PT, DPT Acute Rehabilitation Services Office [phone]    [name] 06/30/2022, 12:21 PM

## 2022-07-01 MED ORDER — METOPROLOL SUCCINATE ER 25 MG PO TB24
25.0000 mg | ORAL_TABLET | Freq: Every day | ORAL | Status: DC
  Administered 2022-07-01 – 2022-07-23 (×16): 25 mg via ORAL
  Filled 2022-07-01 (×23): qty 1

## 2022-07-01 MED ORDER — ENSURE ENLIVE PO LIQD
237.0000 mL | Freq: Two times a day (BID) | ORAL | Status: DC
  Administered 2022-07-01 – 2022-07-19 (×12): 237 mL via ORAL

## 2022-07-01 NOTE — Progress Notes (Signed)
[facility] PROGRESS NOTE    Progress Note  Wayne Stokes  JOI:[id number] DOB: July 01, 1966 DOA: 05/26/2022 PCP: [name], NP     Brief Narrative:   Wayne Stokes is an 56 y.o. male from [place] speaking, diabetes mellitus type 2, essential hypertension with lumbar disc disease with spinal stenosis, T12 compression fracture and mild cognitive impairment admitted for uncontrolled back pain and severe spinal lumbar stenosis orthopedic, neurosurgery evaluated the patient and recommended TLC L for pain control and outpatient follow-up.  Physical therapy recommended inpatient rehab difficult to place due to lack of insurance. IR was consulted to perform kyphoplasty of T12 compression fracture on 06/18/2022    Assessment/Plan:   Severe back pain/lumbar spinal stenosis and T12 compression fraction: MRI did not show any cord injury neurosurgery was consulted recommended no surgical intervention the recommended DLCO. IR was consulted to perform kyphoplasty on 06/18/2022. Physical therapy evaluated the patient recommended skilled nursing facility is a challenge because he does not have insurance.  Leukocytosis/fever: Of unclear source now resolved. He was initially started on IV vancomycin and Rocephin Work-up was unrevealing, there is likely reactive due to T12 compression fracture MRI was done of the shoulder there was question septic arthritis ,IR aspirated again on 06/06/2022 that showed no signs of infection.  ID was consulted and antibiotics were discontinued.  Right shoulder pain: Continue oral narcotics follow-up with orthopedic surgery as an outpatient.  Abdominal discomfort/microcytic anemia/iron deficiency anemia: CT scan of the abdomen pelvis did not show any acute findings. Hemoglobin has remained relatively stable.  Acute urinary retention/acute kidney injury: Felt to be secondary to back pain started on Flomax renal function stabilized after Foley was placed, has  failed voiding trial twice. We will need to follow-up with urology as an outpatient.  Constipation: Resolved.  Vitamin D deficiency: Continue supplementation.  Hyponatremia: Likely chronic.  Hypokalemia/hypomagnesemia: Replete orally now resolved continue to monitor intermittently.  Sinus tachycardia: Resolved with metoprolol.  Diabetes mellitus type 2: Currently on sliding scale insulin has not required insulin for over 5 days. Stop checking CBGs.  Essential hypertension: Well-controlled on metoprolol.  Hyperlipidemia: Continue statins.    DVT prophylaxis: lovenox Family Communication:none Status is: Inpatient Remains inpatient appropriate because: Awaiting skilled nursing select placement.    Code Status:     Code Status Orders  (From admission, onward)           Start     Ordered   05/26/22 2254  Full code  Continuous        05/26/22 2254           Code Status History     This patient has a current code status but no historical code status.         IV Access:   Peripheral IV   Procedures and diagnostic studies:   No results found.   Medical Consultants:   None.   Subjective:    Wayne Stokes sleeping today did not want to be bothered.  Objective:    Vitals:   06/30/22 0935 06/30/22 2141 07/01/22 0416 07/01/22 0711  BP: 119/78 (!) 147/85 135/78 118/82  Pulse: 90 100 (!) 101 (!) 110  Resp: 15 15 18    Temp: 97.7 F (36.5 C) 97.8 F (36.6 C) 98 F (36.7 C) (!) 97.5 F (36.4 C)  TempSrc: Oral Oral  Oral  SpO2: 95% 95% 95% 95%  Weight:      Height:       SpO2: 95 % O2 Flow Rate (  L/min): 2 L/min   Intake/Output Summary (Last 24 hours) at 07/01/2022 0919 Last data filed at 06/30/2022 1700 Gross per 24 hour  Intake 50 ml  Output 400 ml  Net -350 ml    Filed Weights   06/07/22 0500 06/24/22 1407 06/25/22 0500  Weight: 61.7 kg 57.4 kg 58.3 kg    Exam: General exam: In no acute distress. Respiratory system: Good  air movement and clear to auscultation. Cardiovascular system: S1 & S2 heard, RRR. No JVD. Gastrointestinal system: Abdomen is nondistended, soft and nontender.  Extremities: No pedal edema. Skin: No rashes, lesions or ulcers Psychiatry: Judgement and insight appear normal. Mood & affect appropriate.    Data Reviewed:    Labs: Basic Metabolic Panel: Recent Labs  Lab 06/27/22 0322 06/28/22 0300 06/29/22 0315  NA 133*  --  134*  K 3.0* 3.2* 3.9  CL 94*  --  98  CO2 31  --  27  GLUCOSE 89  --  82  BUN <5*  --  6  CREATININE 0.83  --  0.86  CALCIUM 8.5*  --  8.5*  MG 1.6* 1.7  --     GFR Estimated Creatinine Clearance: 67 mL/min (by C-G formula based on SCr of 0.86 mg/dL). Liver Function Tests: No results for input(s): "AST", "ALT", "ALKPHOS", "BILITOT", "PROT", "ALBUMIN" in the last 168 hours. No results for input(s): "LIPASE", "AMYLASE" in the last 168 hours. No results for input(s): "AMMONIA" in the last 168 hours. Coagulation profile No results for input(s): "INR", "PROTIME" in the last 168 hours. COVID-19 Labs  No results for input(s): "DDIMER", "FERRITIN", "LDH", "CRP" in the last 72 hours.  Lab Results  Component Value Date   SARSCOV2NAA NEGATIVE 06/01/2022   [place] NEGATIVE 05/07/2022    CBC: Recent Labs  Lab 06/27/22 0322  WBC 8.6  NEUTROABS 5.2  HGB 7.9*  HCT 23.5*  MCV 68.5*  PLT 498*    Cardiac Enzymes: No results for input(s): "CKTOTAL", "CKMB", "CKMBINDEX", "TROPONINI" in the last 168 hours. BNP (last 3 results) No results for input(s): "PROBNP" in the last 8760 hours. CBG: Recent Labs  Lab 06/28/22 1230 06/28/22 1550 06/29/22 0836 06/29/22 0929 06/29/22 0930  GLUCAP 97 84 64* 69* 72    D-Dimer: No results for input(s): "DDIMER" in the last 72 hours. Hgb A1c: No results for input(s): "HGBA1C" in the last 72 hours.  Lipid Profile: No results for input(s): "CHOL", "HDL", "LDLCALC", "TRIG", "CHOLHDL", "LDLDIRECT" in the last  72 hours. Thyroid function studies: No results for input(s): "TSH", "T4TOTAL", "T3FREE", "THYROIDAB" in the last 72 hours.  Invalid input(s): "FREET3" Anemia work up: No results for input(s): "VITAMINB12", "FOLATE", "FERRITIN", "TIBC", "IRON", "RETICCTPCT" in the last 72 hours. Sepsis Labs: Recent Labs  Lab 06/27/22 0322  WBC 8.6    Microbiology No results found for this or any previous visit (from the past 240 hour(s)).   Medications:    atorvastatin  40 mg Oral Daily   Chlorhexidine Gluconate Cloth  6 each Topical Daily   cholecalciferol  1,000 Units Oral Daily   donepezil  5 mg Oral QHS   feeding supplement (GLUCERNA SHAKE)  237 mL Oral TID BM   ferrous sulfate  325 mg Oral Q breakfast   fluconazole  100 mg Oral Daily   gabapentin  200 mg Oral TID   lidocaine  1 patch Transdermal Q24H   lidocaine  1 patch Transdermal Q24H   magnesium gluconate  500 mg Oral BID   metoprolol succinate  12.5 mg Oral Daily   morphine  15 mg Oral Q12H   pantoprazole  40 mg Oral Daily   polyethylene glycol  17 g Oral BID   senna-docusate  1 tablet Oral BID   tamsulosin  0.4 mg Oral Daily   Continuous Infusions:     LOS: 36 days   Charlynne Cousins  [facility]  07/01/2022, 9:19 AM

## 2022-07-01 NOTE — Progress Notes (Signed)
Occupational Therapy Treatment Patient Details Name: Hendrixx Mcquown MRN: [id number] DOB: [birth year] Today's Date: 07/01/2022   History of present illness 56 y.o. male presented 05/26/22 with c/o severe and progressively worsening low back pain that radiates into his left buttock and down his LLE. Imaging +acute T12 compression fracture and  L4/5 there is multifactorial severe spinal stenosis and severe bilateral foraminal stenosis. Neurosurgery consult with no acute surgical intervention needed. IR consulted for possible kyphoplasty bu twas delayed due to fever; S/p T12 KP with biopsy on 10/11. 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   OT comments  Patient received in supine with interpreter present. Patient able to get to EOB with supervision and verbal cues with complaints of pain. Patient required assistance to donn brace and asked to use BSC. Patient was min guard to stand from EOB and to transfer to [facility]. Patient required increased time to use bathroom and stood tp clean bottom and required mod assist to complete due to pain. Patient positioned in recliner for comfort. Acute OT to continue to follow.     Recommendations for follow up therapy are one component of a multi-disciplinary discharge planning process, led by the attending physician.  Recommendations may be updated based on patient status, additional functional criteria and insurance authorization.    Follow Up Recommendations  Skilled nursing-short term rehab (<3 hours/day)    Assistance Recommended at Discharge Frequent or constant Supervision/Assistance  Patient can return home with the following  A little help with walking and/or transfers;Assistance with cooking/housework;Assist for transportation;Help with stairs or ramp for entrance;A lot of help with bathing/dressing/bathroom;Direct supervision/assist for medications  management   Equipment Recommendations  BSC/3in1    Recommendations for Other Services      Precautions / Restrictions Precautions Precautions: Fall;Back Precaution Booklet Issued: No Precaution Comments: Back precautions for comfort Required Braces or Orthoses: Spinal Brace Spinal Brace: Lumbar corset;Applied in sitting position Restrictions Weight Bearing Restrictions: No       Mobility Bed Mobility Overal bed mobility: Needs Assistance Bed Mobility: Sidelying to Sit, Rolling Rolling: Supervision Sidelying to sit: Supervision       General bed mobility comments: increased time and verbal cues    Transfers Overall transfer level: Needs assistance Equipment used: Rolling walker (2 wheels) Transfers: Sit to/from Stand, Bed to chair/wheelchair/BSC Sit to Stand: Min guard, Min assist     Step pivot transfers: Min guard     General transfer comment: min guard to transfer to BSC from EOB and BSC to recliner     Balance Overall balance assessment: Needs assistance, History of Falls Sitting-balance support: No upper extremity supported, Feet supported Sitting balance-Leahy Scale: Good     Standing balance support: Bilateral upper extremity supported, During functional activity Standing balance-Leahy Scale: Poor Standing balance comment: difficulty using one hand to wipe bottom while standing                           ADL either performed or assessed with clinical judgement   ADL Overall ADL's : Needs assistance/impaired     Grooming: Wash/dry hands;Wash/dry face;Supervision/safety;Sitting                   Toilet Transfer: Min guard;Ambulation;BSC/3in1;Rolling walker (2 wheels) Toilet Transfer Details (indicate cue type and reason): cues for safety and hand placement Toileting- Clothing Manipulation and Hygiene: Moderate assistance;Sit to/from stand Electrical engineer  Details (indicate cue type and reason): mod assist for  thoroughness with patient having difficulty standing without BUE support       General ADL Comments: patient required increased time on BSC to have BM    Extremity/Trunk Assessment Upper Extremity Assessment RUE Deficits / Details: right shoulder pain. Decreased grasp strength and finger dexerity RUE Coordination: decreased fine motor;decreased gross motor LUE Deficits / Details: increased shoulder pain. Decreased grasp strength and finger dexerity LUE Coordination: decreased fine motor;decreased gross motor            Vision       Perception     Praxis      Cognition Arousal/Alertness: Awake/alert Behavior During Therapy: Flat affect Overall Cognitive Status: Impaired/Different from baseline Area of Impairment: Problem solving, Following commands, Orientation                 Orientation Level: Disoriented to, Time     Following Commands: Follows one step commands with increased time Safety/Judgement: Decreased awareness of safety   Problem Solving: Requires verbal cues, Requires tactile cues General Comments: seen with interpreter. Frequent cues for safety        Exercises      Shoulder Instructions       General Comments Pt was repositioned with nursing available to see how the set up was decided.  Pt is significantly quieter and calmer with repositioning on chair.    Pertinent Vitals/ Pain       Pain Assessment Pain Assessment: Faces Faces Pain Scale: Hurts even more Pain Location: R shoulder, back Pain Descriptors / Indicators: Grimacing, Moaning Pain Intervention(s): Limited activity within patient's tolerance, Monitored during session, Repositioned, RN gave pain meds during session  Home Living                                          Prior Functioning/Environment              Frequency  Min 2X/week        Progress Toward Goals  OT Goals(current goals can now be found in the care plan section)  Progress  towards OT goals: Progressing toward goals  Acute Rehab OT Goals Patient Stated Goal: get better OT Goal Formulation: With patient Time For Goal Achievement: 07/10/22 Potential to Achieve Goals: Fair ADL Goals Pt Will Perform Grooming: standing;with supervision Pt Will Perform Lower Body Dressing: sit to/from stand;with supervision Pt Will Transfer to Toilet: with supervision;ambulating Pt Will Perform Toileting - Clothing Manipulation and hygiene: with supervision;sit to/from stand Additional ADL Goal #1: Pt will perform bed mobility independently in preparation for ADLs. Additional ADL Goal #2: Pt will independent don/doff brace in preparation for ADLs Additional ADL Goal #3: Pt will use nurse call button when needing assistance 50% of time.  Plan Discharge plan remains appropriate    Co-evaluation                 AM-PAC OT "6 Clicks" Daily Activity     Outcome Measure   Help from another person eating meals?: None Help from another person taking care of personal grooming?: A Little Help from another person toileting, which includes using toliet, bedpan, or urinal?: A Little Help from another person bathing (including washing, rinsing, drying)?: A Little Help from another person to put on and taking off regular upper body clothing?: A Little Help from another person to put on  and taking off regular lower body clothing?: A Little 6 Click Score: 19    End of Session Equipment Utilized During Treatment: Rolling walker (2 wheels);Back brace  OT Visit Diagnosis: Other abnormalities of gait and mobility (R26.89);Muscle weakness (generalized) (M62.81);Pain Pain - Right/Left: Right Pain - part of body: Shoulder   Activity Tolerance Patient limited by pain   Patient Left in chair;with call bell/phone within reach;with chair alarm set   Nurse Communication Mobility status        Time: [id number] OT Time Calculation (min): 39 min  Charges: OT General Charges $OT Visit:  1 Visit OT Treatments $Self Care/Home Management : 38-52 mins  Lodema Hong, [place]  Office [place] 07/01/2022, 1:46 PM

## 2022-07-01 NOTE — Progress Notes (Signed)
Physical Therapy Treatment Patient Details Name: Wayne Stokes MRN: [id number] DOB: 1965/09/15 Today's Date: 07/01/2022   History of Present Illness 56 y.o. male presented 05/26/22 with c/o severe and progressively worsening low back pain that radiates into his left buttock and down his LLE. Imaging +acute T12 compression fracture and  L4/5 there is multifactorial severe spinal stenosis and severe bilateral foraminal stenosis. Neurosurgery consult with no acute surgical intervention needed. IR consulted for possible kyphoplasty bu twas delayed due to fever; S/p T12 KP with biopsy on 10/11. 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    PT Comments    Pt was seen with nursing along to try to move pt given his reluctance to move after having done OT.  Pt has limited tolerance for any movement but was very uncomfortable in the chair in LSO.  Took pillows for further back support to sit more upright, and supported feet dangling off chair along with midline orientation.  Pt finally relaxed, as he had been medicated a sufficient time before but was finally in enough support to let go and close eyes to rest.   Recommendations for follow up therapy are one component of a multi-disciplinary discharge planning process, led by the attending physician.  Recommendations may be updated based on patient status, additional functional criteria and insurance authorization.  Follow Up Recommendations  Skilled nursing-short term rehab (<3 hours/day) Can patient physically be transported by private vehicle: Yes   Assistance Recommended at Discharge Frequent or constant Supervision/Assistance  Patient can return home with the following Assistance with cooking/housework;Assist for transportation;Help with stairs or ramp for entrance;A little help with walking and/or transfers;A little help with  bathing/dressing/bathroom   Equipment Recommendations  BSC/3in1;Rolling walker (2 wheels)    Recommendations for Other Services       Precautions / Restrictions Precautions Precautions: Fall;Back Precaution Booklet Issued: No Precaution Comments: Back precautions for comfort Required Braces or Orthoses: Spinal Brace Spinal Brace: Lumbar corset;Applied in sitting position Restrictions Weight Bearing Restrictions: No     Mobility  Bed Mobility               General bed mobility comments: up in chair    Transfers                   General transfer comment: declined    Ambulation/Gait               General Gait Details: declined   Stairs             Wheelchair Mobility    Modified Rankin (Stroke Patients Only)       Balance                                            Cognition Arousal/Alertness: Awake/alert Behavior During Therapy: Flat affect Overall Cognitive Status: Impaired/Different from baseline                   Orientation Level: Situation     Following Commands: Follows one step commands with increased time     Problem Solving: Requires verbal cues, Requires tactile cues, Slow processing          Exercises      General Comments General comments (skin integrity, edema, etc.): Pt was repositioned with nursing available to  see how the set up was decided.  Pt is significantly quieter and calmer with repositioning on chair.      Pertinent Vitals/Pain Pain Assessment Pain Assessment: Faces Faces Pain Scale: Hurts whole lot Pain Location: R shoulder, back Pain Descriptors / Indicators: Grimacing, Guarding, Moaning Pain Intervention(s): Monitored during session, Repositioned    Home Living                          Prior Function            PT Goals (current goals can now be found in the care plan section) Acute Rehab PT Goals Patient Stated Goal: refused gait     Frequency    Min 3X/week      PT Plan Current plan remains appropriate    Co-evaluation              AM-PAC PT "6 Clicks" Mobility   Outcome Measure  Help needed turning from your back to your side while in a flat bed without using bedrails?: A Little Help needed moving from lying on your back to sitting on the side of a flat bed without using bedrails?: A Little Help needed moving to and from a bed to a chair (including a wheelchair)?: A Little Help needed standing up from a chair using your arms (e.g., wheelchair or bedside chair)?: A Little Help needed to walk in hospital room?: A Little Help needed climbing 3-5 steps with a railing? : A Lot 6 Click Score: 17    End of Session   Activity Tolerance: Patient limited by pain Patient left: in chair;with chair alarm set Nurse Communication: Mobility status PT Visit Diagnosis: Other abnormalities of gait and mobility (R26.89);Muscle weakness (generalized) (M62.81)     Time: 6004-5997 PT Time Calculation (min) (ACUTE ONLY): 10 min  Charges:  $Therapeutic Activity: 8-22 mins    Ramond Dial 07/01/2022, 1:23 PM  Mee Hives, PT PhD Acute Rehab Dept. Number: [place] [facility] [place]

## 2022-07-01 NOTE — Progress Notes (Signed)
Nutrition Follow-up  DOCUMENTATION CODES:   Not applicable  INTERVENTION:  Liberalize diet from a heart healthy to a regular diet to provide widest variety of menu options to enhance nutritional adequacy Discontinue Glucerna shakes Ensure Enlive po BID, each supplement provides 350 kcal and 20 grams of protein. Request updated weight  NUTRITION DIAGNOSIS:   Increased nutrient needs related to post-op healing as evidenced by other (comment) (Compression fracture).  Ongoing  GOAL:   Patient will meet greater than or equal to 90% of their needs  Addressing via nutrition supplements and meals  MONITOR:   PO intake  REASON FOR ASSESSMENT:   Consult Assessment of nutrition requirement/status  ASSESSMENT:   56 y.o. male admits related to evaluation of worsening lower back pain. PMH includes: T2DM, HTN, anemia, AKI. 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  Listed food allergies: beef products, shellfish, egg/egg derived products, eggplant, pizza  Pending SNF placement.   RD working remotely. Unable to check in with pt at bedside. There is limited documentation of meal completions on file to review. Of the meals documented, it appears that the intake of each meal has improved. Reviewed MAR, pt accepting all 3 Glucerna shakes within the last 3 days. Given pt's blood sugar well controlled, will adjust to a more calorie and protein dense nutrition supplement to augment PO intake.   Meal completions:  10/17: 25% lunch 10/18: 50% breakfast 10/20: 80% breakfast, 100% lunch  Medications: vitamin D3, ferrous sulfate, magonate, protonix, miralax, senna  Labs: CBG's 64-98 (10/21-10/22)  Diet Order:   Diet Order             Diet Heart Room service appropriate? Yes; Fluid consistency: Thin  Diet effective now                   EDUCATION NEEDS:   Education needs  have been addressed  Skin:  Skin Assessment: Reviewed RN Assessment  Last BM:  10/21  Height:   Ht Readings from Last 1 Encounters:  06/01/22 5' (1.524 m)    Weight:   Wt Readings from Last 1 Encounters:  06/25/22 58.3 kg    Ideal Body Weight:  48.2 kg  BMI:  Body mass index is 25.1 kg/m.  Estimated Nutritional Needs:   Kcal:  1600-1800  Protein:  80-95g  Fluid:  >/=1.6L  Clayborne Dana, RDN, LDN Clinical Nutrition

## 2022-07-01 NOTE — Plan of Care (Signed)
  Problem: Health Behavior/Discharge Planning: Goal: Ability to manage health-related needs will improve Outcome: Progressing   

## 2022-07-02 NOTE — Progress Notes (Signed)
Mobility Specialist Progress Note   07/02/22 1150  Mobility  Activity Transferred to/from [facility]  Level of Assistance +2 (takes two people) Education officer, museum)  Information systems manager Ambulated (ft) 2 ft  Activity Response Tolerated well  $Mobility charge 1 Mobility   Received pt in chair requesting to use BSC for BM. +2A for safety, pt verbally expressing discomfort w/ groans throughout transfer but no faults during. Left on [facility] w/ call bell in reach and RN aware of position.   Holland Falling Mobility Specialist Acute Rehab Office:  [phone]

## 2022-07-02 NOTE — Progress Notes (Signed)
Tried to get vitals and give scheduled medications, patient refused.  He stated "No, no, no maybe tomorrow". He placed his head under the covers.

## 2022-07-02 NOTE — Progress Notes (Signed)
Physical Therapy Treatment Patient Details Name: Wayne Stokes MRN: [id number] DOB: Sep 28, 1965 Today's Date: 07/02/2022   History of Present Illness 56 y.o. male presented 05/26/22 with c/o severe and progressively worsening low back pain that radiates into his left buttock and down his LLE. Imaging +acute T12 compression fracture and  L4/5 there is multifactorial severe spinal stenosis and severe bilateral foraminal stenosis. Neurosurgery consult with no acute surgical intervention needed. IR consulted for possible kyphoplasty bu twas delayed due to fever; S/p T12 KP with biopsy on 10/11. 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    PT Comments    Pt was seen for progression of gait but asked initially to be assisted to [facility].  Split session due to length of time pt needed to be done and returned for mobility to walk to bed.  Pt vomited twice while coughing so assisted him to change gown.  Pt had nursing in to ck on him and declined to get meds for his nausea.   Follow up with him for further mobility as tolerated, and get walking distance to a home level as soon as possible.     Recommendations for follow up therapy are one component of a multi-disciplinary discharge planning process, led by the attending physician.  Recommendations may be updated based on patient status, additional functional criteria and insurance authorization.  Follow Up Recommendations  Skilled nursing-short term rehab (<3 hours/day) Can patient physically be transported by private vehicle: Yes   Assistance Recommended at Discharge Frequent or constant Supervision/Assistance  Patient can return home with the following A little help with walking and/or transfers;Assistance with cooking/housework;Direct supervision/assist for medications management;Direct supervision/assist for financial management;Assist for  transportation;Help with stairs or ramp for entrance   Equipment Recommendations  BSC/3in1;Rolling walker (2 wheels)    Recommendations for Other Services       Precautions / Restrictions Precautions Precautions: Fall;Back Precaution Booklet Issued: No Precaution Comments: Back precautions for comfort Required Braces or Orthoses: Spinal Brace Spinal Brace: Lumbar corset;Applied in sitting position Restrictions Weight Bearing Restrictions: No     Mobility  Bed Mobility Overal bed mobility: Needs Assistance Bed Mobility: Sidelying to Sit, Rolling Rolling: Min guard Sidelying to sit: Min guard     Sit to sidelying: Min assist      Transfers Overall transfer level: Needs assistance Equipment used: Rolling walker (2 wheels) Transfers: Sit to/from Stand Sit to Stand: Min guard, Min assist           General transfer comment: min assist to power up then min guard to steady    Ambulation/Gait Ambulation/Gait assistance: Min guard Gait Distance (Feet): 12 Feet (6 x 2) Assistive device: Rolling walker (2 wheels) Gait Pattern/deviations: Step-to pattern, Step-through pattern, Decreased stride length, Wide base of support, Trunk flexed Gait velocity: decreased Gait velocity interpretation: <1.31 ft/sec, indicative of household ambulator Pre-gait activities: standign balance ck General Gait Details: pt needs cues for posture and safety of turning on walker   Stairs             Wheelchair Mobility    Modified Rankin (Stroke Patients Only)       Balance Overall balance assessment: Needs assistance Sitting-balance support: Feet supported Sitting balance-Leahy Scale: Good     Standing balance support: Bilateral upper extremity supported, During functional activity Standing balance-Leahy Scale: Fair Standing balance comment: less than fair dynamically  Cognition Arousal/Alertness: Awake/alert Behavior During Therapy:  Flat affect Overall Cognitive Status: Impaired/Different from baseline Area of Impairment: Problem solving, Following commands, Awareness, Safety/judgement                 Orientation Level: Situation     Following Commands: Follows one step commands with increased time, Follows one step commands inconsistently Safety/Judgement: Decreased awareness of safety, Decreased awareness of deficits Awareness: Intellectual Problem Solving: Slow processing, Requires verbal cues, Requires tactile cues General Comments: seen with interpreter. Frequent cues for safety        Exercises      General Comments General comments (skin integrity, edema, etc.): assisted to scoot up in bed upon return with nursing      Pertinent Vitals/Pain Pain Assessment Pain Assessment: Faces Faces Pain Scale: Hurts even more Pain Location: R shoulder, back Pain Descriptors / Indicators: Grimacing, Guarding, Moaning Pain Intervention(s): Limited activity within patient's tolerance, Monitored during session, Repositioned, Premedicated before session    Home Living                          Prior Function            PT Goals (current goals can now be found in the care plan section)      Frequency    Min 3X/week      PT Plan Current plan remains appropriate    Co-evaluation              AM-PAC PT "6 Clicks" Mobility   Outcome Measure  Help needed turning from your back to your side while in a flat bed without using bedrails?: A Little Help needed moving from lying on your back to sitting on the side of a flat bed without using bedrails?: A Little Help needed moving to and from a bed to a chair (including a wheelchair)?: A Little Help needed standing up from a chair using your arms (e.g., wheelchair or bedside chair)?: A Little Help needed to walk in hospital room?: A Little Help needed climbing 3-5 steps with a railing? : A Lot 6 Click Score: 17    End of Session  Equipment Utilized During Treatment: Back brace Activity Tolerance: Patient limited by pain Patient left: in bed;with call bell/phone within reach;with bed alarm set;with nursing/sitter in room;Other (comment) Musician) Nurse Communication: Mobility status PT Visit Diagnosis: Other abnormalities of gait and mobility (R26.89);Muscle weakness (generalized) (M62.81)     Time: 0076-2263 (+3354-5625) PT Time Calculation (min) (ACUTE ONLY): 8 min  Charges:  $Gait Training: 8-22 mins $Therapeutic Activity: 8-22 mins      Ivar Drape 07/02/2022, 12:46 PM  Samul Dada, PT PhD Acute Rehab Dept. Number: [facility] [id number] and [facility] [phone]

## 2022-07-02 NOTE — Progress Notes (Signed)
[facility] PROGRESS NOTE    Progress Note  Wayne Stokes  ZOX:[id number] DOB: 10-19-1965 DOA: 05/26/2022 PCP: [name], NP     Brief Narrative:   Wayne Stokes is an 56 y.o. male from [place] speaking, diabetes mellitus type 2, essential hypertension with lumbar disc disease with spinal stenosis, T12 compression fracture and mild cognitive impairment admitted for uncontrolled back pain and severe spinal lumbar stenosis orthopedic, neurosurgery evaluated the patient and recommended TLC L for pain control and outpatient follow-up.  Physical therapy recommended inpatient rehab difficult to place due to lack of insurance. IR was consulted to perform kyphoplasty of T12 compression fracture on 06/18/2022 Patient is medically stable for transfer awaiting placement to skilled nursing facility   Assessment/Plan:   Severe back pain/lumbar spinal stenosis and T12 compression fraction: MRI did not show any cord injury neurosurgery was consulted recommended no surgical intervention the recommended DLCO. IR was consulted to perform kyphoplasty on 06/18/2022. Physical therapy evaluated the patient recommended skilled nursing facility is a challenge because he does not have insurance.  Leukocytosis/fever: Of unclear source now resolved. He was initially started on IV vancomycin and Rocephin Work-up was unrevealing, there is likely reactive due to T12 compression fracture MRI was done of the shoulder there was question septic arthritis ,IR aspirated again on 06/06/2022 that showed no signs of infection.  ID was consulted and antibiotics were discontinued.  Right shoulder pain: Continue oral narcotics follow-up with orthopedic surgery as an outpatient.  Abdominal discomfort/microcytic anemia/iron deficiency anemia: CT scan of the abdomen pelvis did not show any acute findings. Hemoglobin has remained relatively stable.  Acute urinary retention/acute kidney injury: Felt to be secondary  to back pain started on Flomax renal function stabilized after Foley was placed, has failed voiding trial twice. We will need to follow-up with urology as an outpatient.  Constipation: Resolved.  Vitamin D deficiency: Continue supplementation.  Hyponatremia: Likely chronic.  Hypokalemia/hypomagnesemia: Replete orally now resolved continue to monitor intermittently.  Sinus tachycardia: Resolved with metoprolol.  Diabetes mellitus type 2: Currently on sliding scale insulin has not required insulin for over 5 days. Stop checking CBGs.  Essential hypertension: Well-controlled on metoprolol.  Hyperlipidemia: Continue statins.    DVT prophylaxis: lovenox Family Communication:none Status is: Inpatient Remains inpatient appropriate because: Awaiting skilled nursing select placement.    Code Status:     Code Status Orders  (From admission, onward)           Start     Ordered   05/26/22 2254  Full code  Continuous        05/26/22 2254           Code Status History     This patient has a current code status but no historical code status.         IV Access:   Peripheral IV   Procedures and diagnostic studies:   No results found.   Medical Consultants:   None.   Subjective:    [name] sleeping today did not want to be bothered.  Objective:    Vitals:   07/01/22 2016 07/02/22 0500 07/02/22 0544 07/02/22 0732  BP: 98/73  102/65 104/74  Pulse: 94  87 87  Resp: 18  16 15   Temp: 98.6 F (37 C)  99 F (37.2 C) 99.1 F (37.3 C)  TempSrc: Oral  Axillary   SpO2: 92%  92% 94%  Weight:  53.7 kg    Height:       SpO2:  94 % O2 Flow Rate (L/min): 2 L/min   Intake/Output Summary (Last 24 hours) at 07/02/2022 0849 Last data filed at 07/02/2022 0547 Gross per 24 hour  Intake 100 ml  Output 600 ml  Net -500 ml    Filed Weights   06/24/22 1407 06/25/22 0500 07/02/22 0500  Weight: 57.4 kg 58.3 kg 53.7 kg    Exam: General exam: In  no acute distress. Respiratory system: Good air movement and clear to auscultation. Cardiovascular system: S1 & S2 heard, RRR. No JVD. Gastrointestinal system: Abdomen is nondistended, soft and nontender.  Extremities: No pedal edema. Skin: No rashes, lesions or ulcers Psychiatry: Judgement and insight appear normal. Mood & affect appropriate.    Data Reviewed:    Labs: Basic Metabolic Panel: Recent Labs  Lab 06/27/22 0322 06/28/22 0300 06/29/22 0315  NA 133*  --  134*  K 3.0* 3.2* 3.9  CL 94*  --  98  CO2 31  --  27  GLUCOSE 89  --  82  BUN <5*  --  6  CREATININE 0.83  --  0.86  CALCIUM 8.5*  --  8.5*  MG 1.6* 1.7  --     GFR Estimated Creatinine Clearance: 67 mL/min (by C-G formula based on SCr of 0.86 mg/dL). Liver Function Tests: No results for input(s): "AST", "ALT", "ALKPHOS", "BILITOT", "PROT", "ALBUMIN" in the last 168 hours. No results for input(s): "LIPASE", "AMYLASE" in the last 168 hours. No results for input(s): "AMMONIA" in the last 168 hours. Coagulation profile No results for input(s): "INR", "PROTIME" in the last 168 hours. COVID-19 Labs  No results for input(s): "DDIMER", "FERRITIN", "LDH", "CRP" in the last 72 hours.  Lab Results  Component Value Date   SARSCOV2NAA NEGATIVE 06/01/2022   SARSCOV2NAA NEGATIVE 05/07/2022    CBC: Recent Labs  Lab 06/27/22 0322  WBC 8.6  NEUTROABS 5.2  HGB 7.9*  HCT 23.5*  MCV 68.5*  PLT 498*    Cardiac Enzymes: No results for input(s): "CKTOTAL", "CKMB", "CKMBINDEX", "TROPONINI" in the last 168 hours. BNP (last 3 results) No results for input(s): "PROBNP" in the last 8760 hours. CBG: Recent Labs  Lab 06/28/22 1230 06/28/22 1550 06/29/22 0836 06/29/22 0929 06/29/22 0930  GLUCAP 97 84 64* 69* 72    D-Dimer: No results for input(s): "DDIMER" in the last 72 hours. Hgb A1c: No results for input(s): "HGBA1C" in the last 72 hours.  Lipid Profile: No results for input(s): "CHOL", "HDL",  "LDLCALC", "TRIG", "CHOLHDL", "LDLDIRECT" in the last 72 hours. Thyroid function studies: No results for input(s): "TSH", "T4TOTAL", "T3FREE", "THYROIDAB" in the last 72 hours.  Invalid input(s): "FREET3" Anemia work up: No results for input(s): "VITAMINB12", "FOLATE", "FERRITIN", "TIBC", "IRON", "RETICCTPCT" in the last 72 hours. Sepsis Labs: Recent Labs  Lab 06/27/22 0322  WBC 8.6    Microbiology No results found for this or any previous visit (from the past 240 hour(s)).   Medications:    atorvastatin  40 mg Oral Daily   Chlorhexidine Gluconate Cloth  6 each Topical Daily   cholecalciferol  1,000 Units Oral Daily   donepezil  5 mg Oral QHS   feeding supplement  237 mL Oral BID BM   ferrous sulfate  325 mg Oral Q breakfast   fluconazole  100 mg Oral Daily   gabapentin  200 mg Oral TID   lidocaine  1 patch Transdermal Q24H   lidocaine  1 patch Transdermal Q24H   magnesium gluconate  500 mg Oral BID  metoprolol succinate  25 mg Oral Daily   morphine  15 mg Oral Q12H   pantoprazole  40 mg Oral Daily   polyethylene glycol  17 g Oral BID   senna-docusate  1 tablet Oral BID   tamsulosin  0.4 mg Oral Daily   Continuous Infusions:     LOS: 37 days   Marinda Elk  [facility]  07/02/2022, 8:49 AM

## 2022-07-02 NOTE — Plan of Care (Signed)
  Problem: Education: Goal: Knowledge of General Education information will improve Description: Including pain rating scale, medication(s)/side effects and non-pharmacologic comfort measures Outcome: Progressing   Problem: Health Behavior/Discharge Planning: Goal: Ability to manage health-related needs will improve Outcome: Progressing   Problem: Clinical Measurements: Goal: Ability to maintain clinical measurements within normal limits will improve Outcome: Progressing Goal: Diagnostic test results will improve Outcome: Progressing   Problem: Activity: Goal: Risk for activity intolerance will decrease Outcome: Progressing   Problem: Coping: Goal: Level of anxiety will decrease Outcome: Progressing   Problem: Elimination: Goal: Will not experience complications related to bowel motility Outcome: Progressing Goal: Will not experience complications related to urinary retention Outcome: Progressing   Problem: Pain Managment: Goal: General experience of comfort will improve Outcome: Progressing   Problem: Safety: Goal: Ability to remain free from injury will improve Outcome: Progressing   Problem: Skin Integrity: Goal: Risk for impaired skin integrity will decrease Outcome: Progressing   Problem: Education: Goal: Ability to describe self-care measures that may prevent or decrease complications (Diabetes Survival Skills Education) will improve Outcome: Progressing Goal: Individualized Educational Video(s) Outcome: Progressing   Problem: Coping: Goal: Ability to adjust to condition or change in health will improve Outcome: Progressing   Problem: Fluid Volume: Goal: Ability to maintain a balanced intake and output will improve Outcome: Progressing   Problem: Health Behavior/Discharge Planning: Goal: Ability to identify and utilize available resources and services will improve Outcome: Progressing Goal: Ability to manage health-related needs will improve Outcome:  Progressing   Problem: Metabolic: Goal: Ability to maintain appropriate glucose levels will improve Outcome: Progressing   Problem: Nutritional: Goal: Maintenance of adequate nutrition will improve Outcome: Progressing Goal: Progress toward achieving an optimal weight will improve Outcome: Progressing   Problem: Skin Integrity: Goal: Risk for impaired skin integrity will decrease Outcome: Progressing   Problem: Tissue Perfusion: Goal: Adequacy of tissue perfusion will improve Outcome: Progressing   

## 2022-07-02 NOTE — Congregational Nurse Program (Signed)
Hospital visit with interpreter [facility].  Patient continues to complain of back and right shoulder pain.  States his hands feel weak and unable to make a fist.  He is frustrated that he is not getting better.  We prayed with him that his health can be restored.  We assured him that his rent is being paid by [name] so that he will have a place to go when discharged.  [name] RN, Congregational Nurse [phone]

## 2022-07-03 NOTE — Progress Notes (Signed)
0900 Went in with OT to mobilize patient to chair. Pt refuses to mobilize. Pt asked if he wanted pain meds. Pt states "No no too much medicine".   1030 With interpreter at the bedside pt was asked if he wanted to move to the chair. Pt declines. Pt asked if he wanted something for his pain. Per interpreter pt states he does not want any of his medication. He only wants to rest. Had interpreter show pt how to utilize call light and made sure it is in reach.

## 2022-07-03 NOTE — Progress Notes (Signed)
Mobility Specialist Progress Note   07/03/22 1150  Mobility  Activity Transferred to/from [facility]  Level of Assistance Standby assist, set-up cues, supervision of patient - no hands on  Assistive Device Front wheel walker  Distance Ambulated (ft) 8 ft  Activity Response Tolerated well  $Mobility charge 1 Mobility   PT requesting assistance to get from bed to [facility] d/t BM urgency. Required Standby A for physical assistance and minA to don brace, no fault during transfer. Pt left on [facility] with call bell in reach and RN notified.   Holland Falling Mobility Specialist Acute Rehab Office:  [phone]

## 2022-07-03 NOTE — Progress Notes (Signed)
[facility] PROGRESS NOTE    Progress Note  Wayne Stokes  WUJ:[id number] DOB: 1965/11/19 DOA: 05/26/2022 PCP: [name], NP     Brief Narrative:   Wayne Stokes is an 56 y.o. male from [place] speaking, diabetes mellitus type 2, essential hypertension with lumbar disc disease with spinal stenosis, T12 compression fracture and mild cognitive impairment admitted for uncontrolled back pain and severe spinal lumbar stenosis orthopedic, neurosurgery evaluated the patient and recommended TLC L for pain control and outpatient follow-up.  Physical therapy recommended inpatient rehab difficult to place due to lack of insurance. IR was consulted to perform kyphoplasty of T12 compression fracture on 06/18/2022 Patient is medically stable for transfer awaiting placement to skilled nursing facility   Assessment/Plan:   Severe back pain/lumbar spinal stenosis and T12 compression fraction: MRI did not show any cord injury neurosurgery was consulted recommended no surgical intervention the recommended DLCO. IR was consulted to perform kyphoplasty on 06/18/2022. Physical therapy evaluated the patient recommended skilled nursing facility is a challenge because he does not have insurance.  Leukocytosis/fever: Of unclear source now resolved. He was initially started on IV vancomycin and Rocephin Work-up was unrevealing, there is likely reactive due to T12 compression fracture MRI was done of the shoulder there was question septic arthritis ,IR aspirated again on 06/06/2022 that showed no signs of infection.  ID was consulted and antibiotics were discontinued.  Right shoulder pain: Continue oral narcotics follow-up with orthopedic surgery as an outpatient.  Abdominal discomfort/microcytic anemia/iron deficiency anemia: CT scan of the abdomen pelvis did not show any acute findings. Hemoglobin has remained relatively stable.  Acute urinary retention/acute kidney injury: Felt to be secondary  to back pain started on Flomax renal function stabilized after Foley was placed, has failed voiding trial twice. We will need to follow-up with urology as an outpatient.  Constipation: Resolved.  Vitamin D deficiency: Continue supplementation.  Hyponatremia: Likely chronic.  Hypokalemia/hypomagnesemia: Replete orally now resolved continue to monitor intermittently.  Sinus tachycardia: Resolved with metoprolol.  Diabetes mellitus type 2: Currently on sliding scale insulin has not required insulin for over 5 days. Stop checking CBGs.  Essential hypertension: Well-controlled on metoprolol.  Hyperlipidemia: Continue statins.    DVT prophylaxis: lovenox Family Communication:none Status is: Inpatient Remains inpatient appropriate because: Awaiting skilled nursing select placement.    Code Status:     Code Status Orders  (From admission, onward)           Start     Ordered   05/26/22 2254  Full code  Continuous        05/26/22 2254           Code Status History     This patient has a current code status but no historical code status.         IV Access:   Peripheral IV   Procedures and diagnostic studies:   No results found.   Medical Consultants:   None.   Subjective:    Wayne Stokes sleeping today did not want to be bothered.  Objective:    Vitals:   07/02/22 0544 07/02/22 0732 07/02/22 1246 07/03/22 0709  BP: 102/65 104/74 (!) 141/96 113/72  Pulse: 87 87 90 81  Resp: 16 15 17    Temp: 99 F (37.2 C) 99.1 F (37.3 C) 98.1 F (36.7 C) 98.7 F (37.1 C)  TempSrc: Axillary   Oral  SpO2: 92% 94% 93% 93%  Weight:      Height:  SpO2: 93 % O2 Flow Rate (L/min): 2 L/min  No intake or output data in the 24 hours ending 07/03/22 1007  Filed Weights   06/24/22 1407 06/25/22 0500 07/02/22 0500  Weight: 57.4 kg 58.3 kg 53.7 kg    Exam: General exam: In no acute distress. Respiratory system: Good air movement and clear to  auscultation. Cardiovascular system: S1 & S2 heard, RRR. No JVD. Gastrointestinal system: Abdomen is nondistended, soft and nontender.  Extremities: No pedal edema. Skin: No rashes, lesions or ulcers Psychiatry: Judgement and insight appear normal. Mood & affect appropriate.    Data Reviewed:    Labs: Basic Metabolic Panel: Recent Labs  Lab 06/27/22 0322 06/28/22 0300 06/29/22 0315  NA 133*  --  134*  K 3.0* 3.2* 3.9  CL 94*  --  98  CO2 31  --  27  GLUCOSE 89  --  82  BUN <5*  --  6  CREATININE 0.83  --  0.86  CALCIUM 8.5*  --  8.5*  MG 1.6* 1.7  --     GFR Estimated Creatinine Clearance: 67 mL/min (by C-G formula based on SCr of 0.86 mg/dL). Liver Function Tests: No results for input(s): "AST", "ALT", "ALKPHOS", "BILITOT", "PROT", "ALBUMIN" in the last 168 hours. No results for input(s): "LIPASE", "AMYLASE" in the last 168 hours. No results for input(s): "AMMONIA" in the last 168 hours. Coagulation profile No results for input(s): "INR", "PROTIME" in the last 168 hours. COVID-19 Labs  No results for input(s): "DDIMER", "FERRITIN", "LDH", "CRP" in the last 72 hours.  Lab Results  Component Value Date   SARSCOV2NAA NEGATIVE 06/01/2022   SARSCOV2NAA NEGATIVE 05/07/2022    CBC: Recent Labs  Lab 06/27/22 0322  WBC 8.6  NEUTROABS 5.2  HGB 7.9*  HCT 23.5*  MCV 68.5*  PLT 498*    Cardiac Enzymes: No results for input(s): "CKTOTAL", "CKMB", "CKMBINDEX", "TROPONINI" in the last 168 hours. BNP (last 3 results) No results for input(s): "PROBNP" in the last 8760 hours. CBG: Recent Labs  Lab 06/28/22 1230 06/28/22 1550 06/29/22 0836 06/29/22 0929 06/29/22 0930  GLUCAP 97 84 64* 69* 72    D-Dimer: No results for input(s): "DDIMER" in the last 72 hours. Hgb A1c: No results for input(s): "HGBA1C" in the last 72 hours.  Lipid Profile: No results for input(s): "CHOL", "HDL", "LDLCALC", "TRIG", "CHOLHDL", "LDLDIRECT" in the last 72 hours. Thyroid  function studies: No results for input(s): "TSH", "T4TOTAL", "T3FREE", "THYROIDAB" in the last 72 hours.  Invalid input(s): "FREET3" Anemia work up: No results for input(s): "VITAMINB12", "FOLATE", "FERRITIN", "TIBC", "IRON", "RETICCTPCT" in the last 72 hours. Sepsis Labs: Recent Labs  Lab 06/27/22 0322  WBC 8.6    Microbiology No results found for this or any previous visit (from the past 240 hour(s)).   Medications:    atorvastatin  40 mg Oral Daily   Chlorhexidine Gluconate Cloth  6 each Topical Daily   cholecalciferol  1,000 Units Oral Daily   donepezil  5 mg Oral QHS   feeding supplement  237 mL Oral BID BM   ferrous sulfate  325 mg Oral Q breakfast   fluconazole  100 mg Oral Daily   gabapentin  200 mg Oral TID   lidocaine  1 patch Transdermal Q24H   lidocaine  1 patch Transdermal Q24H   magnesium gluconate  500 mg Oral BID   metoprolol succinate  25 mg Oral Daily   morphine  15 mg Oral Q12H   pantoprazole  40  mg Oral Daily   polyethylene glycol  17 g Oral BID   senna-docusate  1 tablet Oral BID   tamsulosin  0.4 mg Oral Daily   Continuous Infusions:     LOS: 38 days   Marinda Elk  [facility]  07/03/2022, 10:07 AM

## 2022-07-03 NOTE — Plan of Care (Signed)
  Problem: Education: Goal: Knowledge of General Education information will improve Description: Including pain rating scale, medication(s)/side effects and non-pharmacologic comfort measures Outcome: Progressing   Problem: Health Behavior/Discharge Planning: Goal: Ability to manage health-related needs will improve Outcome: Progressing   Problem: Clinical Measurements: Goal: Ability to maintain clinical measurements within normal limits will improve Outcome: Progressing Goal: Diagnostic test results will improve Outcome: Progressing   Problem: Activity: Goal: Risk for activity intolerance will decrease Outcome: Progressing   Problem: Coping: Goal: Level of anxiety will decrease Outcome: Progressing   Problem: Elimination: Goal: Will not experience complications related to bowel motility Outcome: Progressing Goal: Will not experience complications related to urinary retention Outcome: Progressing   Problem: Pain Managment: Goal: General experience of comfort will improve Outcome: Progressing   Problem: Safety: Goal: Ability to remain free from injury will improve Outcome: Progressing   Problem: Skin Integrity: Goal: Risk for impaired skin integrity will decrease Outcome: Progressing   Problem: Education: Goal: Ability to describe self-care measures that may prevent or decrease complications (Diabetes Survival Skills Education) will improve Outcome: Progressing Goal: Individualized Educational Video(s) Outcome: Progressing   Problem: Coping: Goal: Ability to adjust to condition or change in health will improve Outcome: Progressing   Problem: Fluid Volume: Goal: Ability to maintain a balanced intake and output will improve Outcome: Progressing   Problem: Health Behavior/Discharge Planning: Goal: Ability to identify and utilize available resources and services will improve Outcome: Progressing Goal: Ability to manage health-related needs will improve Outcome:  Progressing   Problem: Metabolic: Goal: Ability to maintain appropriate glucose levels will improve Outcome: Progressing   Problem: Nutritional: Goal: Maintenance of adequate nutrition will improve Outcome: Progressing Goal: Progress toward achieving an optimal weight will improve Outcome: Progressing   Problem: Skin Integrity: Goal: Risk for impaired skin integrity will decrease Outcome: Progressing   Problem: Tissue Perfusion: Goal: Adequacy of tissue perfusion will improve Outcome: Progressing   

## 2022-07-03 NOTE — Progress Notes (Signed)
OT Cancellation Note  Patient Details Name: Wayne Stokes MRN: [id number] DOB: 1966/01/06   Cancelled Treatment:    Reason Eval/Treat Not Completed: Patient declined, no reason specified (Patient adamently refused to participate with OT on this date stating he was tired and wanted to rest. Nursing states he has been refusing pain meds.) Lodema Hong, [place]  Office [phone]  Trixie Dredge 07/03/2022, 10:10 AM

## 2022-07-04 MED ORDER — MORPHINE SULFATE (PF) 2 MG/ML IV SOLN: 4.0000 mg | INTRAVENOUS | Status: DC | PRN

## 2022-07-04 NOTE — Progress Notes (Signed)
1430 With interpreter on the telephone pt complained of severe twisting pain in abdomen. Pt was asked if he wanted to move to the chair. Pt declines. Pt asked if he wanted something for his pain. Per interpreter pt states he will take some pain medication. MD notified and ordered morphine upon reassessment of pt he was asleep when asked if he wanted something for the pain now he declined and stated he only wants to rest.

## 2022-07-04 NOTE — Progress Notes (Signed)
Mobility Specialist Progress Note   07/04/22 1322  Mobility  Activity Transferred to/from [facility]  Level of Assistance Standby assist, set-up cues, supervision of patient - no hands on  Assistive Device Front wheel walker;BSC  Distance Ambulated (ft) 10 ft  Range of Motion/Exercises Active;All extremities  Activity Response Tolerated well   Patient received in requesting assistance to and from [facility]. Was able to stand and take steps to commode with supervision. Tolerated without complaint or incident. Was left in recliner with all needs met, call bell in reach.   Wayne Stokes, [place], [place]  Office: [phone]

## 2022-07-04 NOTE — Progress Notes (Signed)
PT Cancellation Note  Patient Details Name: Griff Badley MRN: [id number] DOB: [birth year]   Cancelled Treatment:    Reason Eval/Treat Not Completed: Pain limiting ability to participate  Patient moaning and seemingly uncomfortable in bed. Reports abdominal pain. Declines to work with therapy at this time. RN alerted. Will continue to follow and work with Mr. Naol Ontiveros as tolerated.  Candie Mile, PT, DPT Physical Therapist Acute Rehabilitation Services [facility]  07/04/2022, 4:15 PM

## 2022-07-04 NOTE — Plan of Care (Signed)
  Problem: Education: Goal: Knowledge of General Education information will improve Description: Including pain rating scale, medication(s)/side effects and non-pharmacologic comfort measures Outcome: Progressing   Problem: Health Behavior/Discharge Planning: Goal: Ability to manage health-related needs will improve Outcome: Progressing   Problem: Clinical Measurements: Goal: Ability to maintain clinical measurements within normal limits will improve Outcome: Progressing Goal: Diagnostic test results will improve Outcome: Progressing   Problem: Activity: Goal: Risk for activity intolerance will decrease Outcome: Progressing   Problem: Coping: Goal: Level of anxiety will decrease Outcome: Progressing   Problem: Elimination: Goal: Will not experience complications related to bowel motility Outcome: Progressing Goal: Will not experience complications related to urinary retention Outcome: Progressing   Problem: Pain Managment: Goal: General experience of comfort will improve Outcome: Progressing   Problem: Safety: Goal: Ability to remain free from injury will improve Outcome: Progressing   Problem: Skin Integrity: Goal: Risk for impaired skin integrity will decrease Outcome: Progressing   Problem: Education: Goal: Ability to describe self-care measures that may prevent or decrease complications (Diabetes Survival Skills Education) will improve Outcome: Progressing Goal: Individualized Educational Video(s) Outcome: Progressing   Problem: Coping: Goal: Ability to adjust to condition or change in health will improve Outcome: Progressing   Problem: Fluid Volume: Goal: Ability to maintain a balanced intake and output will improve Outcome: Progressing   Problem: Health Behavior/Discharge Planning: Goal: Ability to identify and utilize available resources and services will improve Outcome: Progressing Goal: Ability to manage health-related needs will improve Outcome:  Progressing   Problem: Metabolic: Goal: Ability to maintain appropriate glucose levels will improve Outcome: Progressing   Problem: Nutritional: Goal: Maintenance of adequate nutrition will improve Outcome: Progressing Goal: Progress toward achieving an optimal weight will improve Outcome: Progressing   Problem: Skin Integrity: Goal: Risk for impaired skin integrity will decrease Outcome: Progressing   Problem: Tissue Perfusion: Goal: Adequacy of tissue perfusion will improve Outcome: Progressing

## 2022-07-04 NOTE — Plan of Care (Signed)
Problem: Education: Goal: Knowledge of General Education information will improve Description: Including pain rating scale, medication(s)/side effects and non-pharmacologic comfort measures 07/04/2022 2043 by Kieth Brightly, LPN Outcome: Progressing 07/04/2022 1823 by Kieth Brightly, LPN Outcome: Progressing   Problem: Health Behavior/Discharge Planning: Goal: Ability to manage health-related needs will improve 07/04/2022 2043 by Kieth Brightly, LPN Outcome: Progressing 07/04/2022 1823 by Kieth Brightly, LPN Outcome: Progressing   Problem: Clinical Measurements: Goal: Ability to maintain clinical measurements within normal limits will improve 07/04/2022 2043 by Kieth Brightly, LPN Outcome: Progressing 07/04/2022 1823 by Kieth Brightly, LPN Outcome: Progressing Goal: Diagnostic test results will improve 07/04/2022 2043 by Kieth Brightly, LPN Outcome: Progressing 07/04/2022 1823 by Kieth Brightly, LPN Outcome: Progressing   Problem: Activity: Goal: Risk for activity intolerance will decrease 07/04/2022 2043 by Kieth Brightly, LPN Outcome: Progressing 07/04/2022 1823 by Kieth Brightly, LPN Outcome: Progressing   Problem: Coping: Goal: Level of anxiety will decrease 07/04/2022 2043 by Kieth Brightly, LPN Outcome: Progressing 07/04/2022 1823 by Kieth Brightly, LPN Outcome: Progressing   Problem: Elimination: Goal: Will not experience complications related to bowel motility 07/04/2022 2043 by Kieth Brightly, LPN Outcome: Progressing 07/04/2022 1823 by Kieth Brightly, LPN Outcome: Progressing Goal: Will not experience complications related to urinary retention 07/04/2022 2043 by Kieth Brightly, LPN Outcome: Progressing 07/04/2022 1823 by Kieth Brightly, LPN Outcome: Progressing   Problem: Pain Managment: Goal: General experience of comfort will improve 07/04/2022 2043 by Kieth Brightly, LPN Outcome: Progressing 07/04/2022 1823 by Kieth Brightly, LPN Outcome: Progressing   Problem: Safety: Goal: Ability to remain free from injury will improve 07/04/2022 2043 by Kieth Brightly, LPN Outcome: Progressing 07/04/2022 1823 by Kieth Brightly, LPN Outcome: Progressing   Problem: Skin Integrity: Goal: Risk for impaired skin integrity will decrease 07/04/2022 2043 by Kieth Brightly, LPN Outcome: Progressing 07/04/2022 1823 by Kieth Brightly, LPN Outcome: Progressing   Problem: Education: Goal: Ability to describe self-care measures that may prevent or decrease complications (Diabetes Survival Skills Education) will improve 07/04/2022 2043 by Kieth Brightly, LPN Outcome: Progressing 07/04/2022 1823 by Kieth Brightly, LPN Outcome: Progressing Goal: Individualized Educational Video(s) 07/04/2022 2043 by Kieth Brightly, LPN Outcome: Progressing 07/04/2022 1823 by Kieth Brightly, LPN Outcome: Progressing   Problem: Coping: Goal: Ability to adjust to condition or change in health will improve 07/04/2022 2043 by Kieth Brightly, LPN Outcome: Progressing 07/04/2022 1823 by Kieth Brightly, LPN Outcome: Progressing   Problem: Fluid Volume: Goal: Ability to maintain a balanced intake and output will improve 07/04/2022 2043 by Kieth Brightly, LPN Outcome: Progressing 07/04/2022 1823 by Kieth Brightly, LPN Outcome: Progressing   Problem: Health Behavior/Discharge Planning: Goal: Ability to identify and utilize available resources and services will improve 07/04/2022 2043 by Kieth Brightly, LPN Outcome: Progressing 07/04/2022 1823 by Kieth Brightly, LPN Outcome: Progressing Goal: Ability to manage health-related needs will improve 07/04/2022 2043 by Kieth Brightly, LPN Outcome: Progressing 07/04/2022 1823 by Kieth Brightly, LPN Outcome: Progressing   Problem: Metabolic: Goal:  Ability to maintain appropriate glucose levels will improve 07/04/2022 2043 by Kieth Brightly, LPN Outcome: Progressing 07/04/2022 1823 by Kieth Brightly, LPN Outcome: Progressing   Problem: Nutritional: Goal: Maintenance of adequate nutrition will improve 07/04/2022 2043 by Kieth Brightly, LPN Outcome: Progressing 07/04/2022 1823 by Kieth Brightly, LPN Outcome: Progressing Goal: Progress toward achieving an optimal weight will improve 07/04/2022  2043 by Kieth Brightly, LPN Outcome: Progressing 07/04/2022 1823 by Kieth Brightly, LPN Outcome: Progressing   Problem: Skin Integrity: Goal: Risk for impaired skin integrity will decrease 07/04/2022 2043 by Kieth Brightly, LPN Outcome: Progressing 07/04/2022 1823 by Kieth Brightly, LPN Outcome: Progressing   Problem: Tissue Perfusion: Goal: Adequacy of tissue perfusion will improve 07/04/2022 2043 by Kieth Brightly, LPN Outcome: Progressing 07/04/2022 1823 by Kieth Brightly, LPN Outcome: Progressing

## 2022-07-04 NOTE — Progress Notes (Signed)
Occupational Therapy Treatment Patient Details Name: Wayne Stokes MRN: [id number] DOB: [birth year] Today's Date: 07/04/2022   History of present illness 56 y.o. male presented 05/26/22 with c/o severe and progressively worsening low back pain that radiates into his left buttock and down his LLE. Imaging +acute T12 compression fracture and  L4/5 there is multifactorial severe spinal stenosis and severe bilateral foraminal stenosis. Neurosurgery consult with no acute surgical intervention needed. IR consulted for possible kyphoplasty bu twas delayed due to fever; S/p T12 KP with biopsy on 10/11. 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   OT comments  Patient seen with interpreter in room. Patient received in bed and had soiled bed. Patient instructed on sidelying to sitting on EOB and assistance to donn back brace. Patient was min assist to power up from EOB and min guard to transfer to [facility].  Gown changed and LB bathing performed seated on BSC with limited participation from patient due to pain. Patient transferred to recliner and positioned for comfort per patient. Patient with limited to no progress due to pain. Acute OT to continue to follow.    Recommendations for follow up therapy are one component of a multi-disciplinary discharge planning process, led by the attending physician.  Recommendations may be updated based on patient status, additional functional criteria and insurance authorization.    Follow Up Recommendations  Skilled nursing-short term rehab (<3 hours/day)    Assistance Recommended at Discharge Frequent or constant Supervision/Assistance  Patient can return home with the following  A little help with walking and/or transfers;Assistance with cooking/housework;Assist for transportation;Help with stairs or ramp for entrance;A lot of help with bathing/dressing/bathroom;Direct  supervision/assist for medications management   Equipment Recommendations  BSC/3in1    Recommendations for Other Services      Precautions / Restrictions Precautions Precautions: Fall;Back Precaution Comments: Back precautions for comfort Required Braces or Orthoses: Spinal Brace Spinal Brace: Lumbar corset Restrictions Weight Bearing Restrictions: No       Mobility Bed Mobility Overal bed mobility: Needs Assistance Bed Mobility: Sidelying to Sit   Sidelying to sit: Min guard       General bed mobility comments: verbal cue and min guard to guide into sitting    Transfers Overall transfer level: Needs assistance Equipment used: Rolling walker (2 wheels) Transfers: Sit to/from Stand, Bed to chair/wheelchair/BSC Sit to Stand: Min guard, Min assist     Step pivot transfers: Min guard     General transfer comment: min assist to power up from EOB and BSC wit hmin guard for transfer with cues for hand placement     Balance Overall balance assessment: Needs assistance Sitting-balance support: Feet supported Sitting balance-Leahy Scale: Good     Standing balance support: Bilateral upper extremity supported, During functional activity Standing balance-Leahy Scale: Poor                             ADL either performed or assessed with clinical judgement   ADL Overall ADL's : Needs assistance/impaired     Grooming: Wash/dry hands;Standing;Min guard       Lower Body Bathing: Maximal assistance;Sit to/from stand Lower Body Bathing Details (indicate cue type and reason): to assist with cleaning following BM in bed Upper Body Dressing : Set up;Sitting Upper Body Dressing Details (indicate cue type and reason): changed gown with min assist to donn brace     Toilet  Transfer: Min guard;Ambulation;BSC/3in1;Rolling walker (2 wheels) Toilet Transfer Details (indicate cue type and reason): cues for safety and hand placement Toileting- Clothing Manipulation  and Hygiene: Moderate assistance;Sit to/from stand Toileting - Clothing Manipulation Details (indicate cue type and reason): Limited participation with patient due to pain       General ADL Comments: Patient received in bed and had soiled bed.    Extremity/Trunk Assessment Upper Extremity Assessment RUE Deficits / Details: right shoulder pain. Decreased grasp strength and finger dexerity RUE Coordination: decreased fine motor;decreased gross motor LUE Deficits / Details: increased shoulder pain. Decreased grasp strength and finger dexerity LUE Coordination: decreased fine motor;decreased gross motor            Vision       Perception     Praxis      Cognition Arousal/Alertness: Awake/alert Behavior During Therapy: Flat affect Overall Cognitive Status: No family/caregiver present to determine baseline cognitive functioning Area of Impairment: Problem solving, Following commands, Awareness, Safety/judgement                 Orientation Level: Situation     Following Commands: Follows one step commands with increased time, Follows one step commands inconsistently Safety/Judgement: Decreased awareness of safety, Decreased awareness of deficits Awareness: Intellectual Problem Solving: Slow processing, Requires verbal cues, Requires tactile cues General Comments: een with interpreter, max encouragement to stay in recliner        Exercises      Shoulder Instructions       General Comments      Pertinent Vitals/ Pain       Pain Assessment Pain Assessment: Faces Faces Pain Scale: Hurts even more Pain Location: back, BUE shoulders Pain Descriptors / Indicators: Grimacing, Guarding, Moaning Pain Intervention(s): Limited activity within patient's tolerance, Monitored during session, Repositioned  Home Living                                          Prior Functioning/Environment              Frequency  Min 2X/week        Progress  Toward Goals  OT Goals(current goals can now be found in the care plan section)  Progress towards OT goals: Progressing toward goals  Acute Rehab OT Goals Patient Stated Goal: feel better OT Goal Formulation: With patient Time For Goal Achievement: 07/10/22 Potential to Achieve Goals: Fair ADL Goals Pt Will Perform Grooming: standing;with supervision Pt Will Perform Lower Body Dressing: sit to/from stand;with supervision Pt Will Transfer to Toilet: with supervision;ambulating Pt Will Perform Toileting - Clothing Manipulation and hygiene: with supervision;sit to/from stand Additional ADL Goal #1: Pt will perform bed mobility independently in preparation for ADLs. Additional ADL Goal #2: Pt will independent don/doff brace in preparation for ADLs Additional ADL Goal #3: Pt will use nurse call button when needing assistance 50% of time.  Plan Discharge plan remains appropriate    Co-evaluation                 AM-PAC OT "6 Clicks" Daily Activity     Outcome Measure   Help from another person eating meals?: None Help from another person taking care of personal grooming?: A Little Help from another person toileting, which includes using toliet, bedpan, or urinal?: A Little Help from another person bathing (including washing, rinsing, drying)?: A Little Help from another person to put on  and taking off regular upper body clothing?: A Little Help from another person to put on and taking off regular lower body clothing?: A Little 6 Click Score: 19    End of Session Equipment Utilized During Treatment: Rolling walker (2 wheels);Back brace  OT Visit Diagnosis: Other abnormalities of gait and mobility (R26.89);Muscle weakness (generalized) (M62.81);Pain Pain - Right/Left: Right Pain - part of body: Shoulder   Activity Tolerance Patient limited by pain   Patient Left in chair;with call bell/phone within reach;with chair alarm set;Other (comment) (interpreter in room)   Nurse  Communication Mobility status        Time: 1010-1032 OT Time Calculation (min): 22 min  Charges: OT General Charges $OT Visit: 1 Visit OT Treatments $Self Care/Home Management : 8-22 mins  Lodema Hong, [place]  Office [phone]   Trixie Dredge 07/04/2022, 12:55 PM

## 2022-07-04 NOTE — Progress Notes (Signed)
Occupational Therapy Treatment Patient Details Name: Wayne Stokes MRN: [id number] DOB: 01/02/1966 Today's Date: 07/04/2022   History of present illness 56 y.o. male presented 05/26/22 with c/o severe and progressively worsening low back pain that radiates into his left buttock and down his LLE. Imaging +acute T12 compression fracture and  L4/5 there is multifactorial severe spinal stenosis and severe bilateral foraminal stenosis. Neurosurgery consult with no acute surgical intervention needed. IR consulted for possible kyphoplasty bu twas delayed due to fever; S/p T12 KP with biopsy on 10/11. 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   OT comments  Patient found yelling out for help, trying to get out of recliner.  Patient pointing to the bathroom and c/o pain, primarily to his back.  OT assisted patient back in the recliner so as to put leg rest down with mod A, otherwise Min A for mobility at RW level.  Patient unable to have a BM and pointing to bed, assist lying down with Min A for bringing his legs up.  Able to stand at sink to wash hands with [employer] and RW management.  Needs placed around him, bed alarm set and nursing alerted.  COTA has patient assigned for treatment this date.  Continue efforts in the acute setting.  SNF has been recommended for post acute rehab.     Recommendations for follow up therapy are one component of a multi-disciplinary discharge planning process, led by the attending physician.  Recommendations may be updated based on patient status, additional functional criteria and insurance authorization.    Follow Up Recommendations  Skilled nursing-short term rehab (<3 hours/day)    Assistance Recommended at Discharge Frequent or constant Supervision/Assistance  Patient can return home with the following  A little help with walking and/or transfers;Assistance with  cooking/housework;Assist for transportation;Help with stairs or ramp for entrance;A lot of help with bathing/dressing/bathroom;Direct supervision/assist for medications management   Equipment Recommendations  BSC/3in1    Recommendations for Other Services      Precautions / Restrictions Precautions Precautions: Fall;Back Required Braces or Orthoses: Spinal Brace Spinal Brace: Lumbar corset Restrictions Weight Bearing Restrictions: No       Mobility Bed Mobility Overal bed mobility: Needs Assistance Bed Mobility: Sit to Sidelying         Sit to sidelying: Min assist      Transfers Overall transfer level: Needs assistance Equipment used: Rolling walker (2 wheels) Transfers: Sit to/from Stand Sit to Stand: Min guard, Min assist                 Balance Overall balance assessment: Needs assistance Sitting-balance support: Feet supported Sitting balance-Leahy Scale: Good     Standing balance support: Bilateral upper extremity supported, During functional activity Standing balance-Leahy Scale: Poor                             ADL either performed or assessed with clinical judgement   ADL       Grooming: Wash/dry hands;Standing;Min guard                         Toileting - Clothing Manipulation Details (indicate cue type and reason): patient unable to have BM  Cognition Arousal/Alertness: Awake/alert Behavior During Therapy: Flat affect Overall Cognitive Status: No family/caregiver present to determine baseline cognitive functioning                                                             Pertinent Vitals/ Pain       Pain Assessment Pain Assessment: Faces Faces Pain Scale: Hurts even more Pain Location: back Pain Descriptors / Indicators: Grimacing, Guarding, Moaning Pain Intervention(s): Monitored during session                                                           Frequency  Min 2X/week        Progress Toward Goals  OT Goals(current goals can now be found in the care plan section)  Progress towards OT goals: Progressing toward goals  Acute Rehab OT Goals OT Goal Formulation: With patient Time For Goal Achievement: 07/10/22 Potential to Achieve Goals: Fair  Plan Discharge plan remains appropriate    Co-evaluation                 AM-PAC OT "6 Clicks" Daily Activity     Outcome Measure   Help from another person eating meals?: None Help from another person taking care of personal grooming?: A Little Help from another person toileting, which includes using toliet, bedpan, or urinal?: A Little Help from another person bathing (including washing, rinsing, drying)?: A Little Help from another person to put on and taking off regular upper body clothing?: A Little Help from another person to put on and taking off regular lower body clothing?: A Little 6 Click Score: 19    End of Session Equipment Utilized During Treatment: Rolling walker (2 wheels);Back brace  OT Visit Diagnosis: Other abnormalities of gait and mobility (R26.89);Muscle weakness (generalized) (M62.81);Pain Pain - Right/Left: Right Pain - part of body: Shoulder   Activity Tolerance Patient limited by pain   Patient Left in bed;with call bell/phone within reach;with bed alarm set   Nurse Communication Other (comment) (patient trying to get out of recliner)        Time: 1208-1220 OT Time Calculation (min): 12 min  Charges: OT General Charges $OT Visit: 1 Visit OT Treatments $Self Care/Home Management : 8-22 mins  07/04/2022  RP, OTR/L  Acute Rehabilitation Services  Office:  [phone]   Suzanna Obey 07/04/2022, 12:24 PM

## 2022-07-04 NOTE — Progress Notes (Signed)
0900 Pt refuses to mobilize. Pt asked if he wanted pain meds. Pt states "No not today, no medicine". Pt also refusing to eat breakfast. MD aware.

## 2022-07-04 NOTE — Progress Notes (Signed)
[facility] PROGRESS NOTE    Progress Note  Wayne Stokes  EXH:[id number] DOB: 1965/09/19 DOA: 05/26/2022 PCP: [name], NP     Brief Narrative:   Wayne Stokes is an 56 y.o. male from [place], diabetes mellitus type 2, essential hypertension with lumbar disc disease with spinal stenosis, T12 compression fracture and mild cognitive impairment (the patient has no family or relatives in the [place]) admitted for uncontrolled back pain and severe spinal lumbar stenosis orthopedic, neurosurgery evaluated the patient and recommended TLC L for pain control and outpatient follow-up.  Physical therapy recommended inpatient rehab difficult to place due to lack of insurance. IR was consulted to perform kyphoplasty of T12 compression fracture on 06/18/2022 Patient is medically stable for transfer awaiting placement to skilled nursing facility   Assessment/Plan:   Severe back pain/lumbar spinal stenosis and T12 compression fraction: MRI did not show any cord injury, neurosurgery was consulted recommended no surgical intervention the recommended DLCO. IR was consulted who perform kyphoplasty on 06/18/2022. Physical therapy evaluated the patient recommended skilled nursing facility is a challenge because he does not have insurance.  Leukocytosis/fever: Of unclear source now resolved. He was initially started on IV vancomycin and Rocephin Work-up was unrevealing, this was likely reactive due to T12 compression fracture MRI was done of the shoulder there was question septic arthritis ,IR aspirated again on 06/06/2022 that showed no signs of infection.   ID was consulted and antibiotics were discontinued.  Right shoulder pain: Continue oral narcotics and lidocaine patch follow-up with orthopedic surgery as an outpatient.  Abdominal discomfort/microcytic anemia/iron deficiency anemia: CT scan of the abdomen pelvis did not show any acute findings. Hemoglobin has remained relatively  stable.  Acute urinary retention/acute kidney injury: Felt to be secondary to back pain started on Flomax renal function stabilized after Foley was placed, has failed voiding trial twice. We will need to follow-up with urology as an outpatient.  Constipation: Resolved.  Vitamin D deficiency: Continue supplementation.  Hyponatremia: Likely chronic.  Hypokalemia/hypomagnesemia: Replete orally now resolved continue to monitor intermittently.  Sinus tachycardia: Resolved with metoprolol.  Diabetes mellitus type 2: Currently on sliding scale insulin has not required insulin for over 5 days. Stop checking CBGs.  Essential hypertension: Well-controlled on metoprolol.  Hyperlipidemia: Continue statins.    DVT prophylaxis: lovenox Family Communication:none Status is: Inpatient Remains inpatient appropriate because: Awaiting skilled nursing select placement.    Code Status:     Code Status Orders  (From admission, onward)           Start     Ordered   05/26/22 2254  Full code  Continuous        05/26/22 2254           Code Status History     This patient has a current code status but no historical code status.         IV Access:   Peripheral IV   Procedures and diagnostic studies:   No results found.   Medical Consultants:   None.   Subjective:    [name] sleeping today did not want to be bothered.  Objective:    Vitals:   07/03/22 1531 07/03/22 2100 07/04/22 0615 07/04/22 0741  BP: 124/72 130/88 131/83 (!) 144/92  Pulse: 93 100 96 90  Resp:  18 16 17   Temp: (!) 97.5 F (36.4 C) 97.6 F (36.4 C)  98 F (36.7 C)  TempSrc: Oral Oral  Oral  SpO2: 96% 97%  95%  Weight:      Height:       SpO2: 95 % O2 Flow Rate (L/min): 2 L/min   Intake/Output Summary (Last 24 hours) at 07/04/2022 0756 Last data filed at 07/04/2022 0555 Gross per 24 hour  Intake 180 ml  Output 500 ml  Net -320 ml    Filed Weights   06/24/22 1407  06/25/22 0500 07/02/22 0500  Weight: 57.4 kg 58.3 kg 53.7 kg    Exam: General exam: In no acute distress. Respiratory system: Good air movement and clear to auscultation. Cardiovascular system: S1 & S2 heard, RRR. No JVD. Gastrointestinal system: Abdomen is nondistended, soft and nontender.  Extremities: No pedal edema. Skin: No rashes, lesions or ulcers Psychiatry: Judgement and insight appear normal. Mood & affect appropriate.    Data Reviewed:    Labs: Basic Metabolic Panel: Recent Labs  Lab 06/28/22 0300 06/29/22 0315  NA  --  134*  K 3.2* 3.9  CL  --  98  CO2  --  27  GLUCOSE  --  82  BUN  --  6  CREATININE  --  0.86  CALCIUM  --  8.5*  MG 1.7  --     GFR Estimated Creatinine Clearance: 67 mL/min (by C-G formula based on SCr of 0.86 mg/dL). Liver Function Tests: No results for input(s): "AST", "ALT", "ALKPHOS", "BILITOT", "PROT", "ALBUMIN" in the last 168 hours. No results for input(s): "LIPASE", "AMYLASE" in the last 168 hours. No results for input(s): "AMMONIA" in the last 168 hours. Coagulation profile No results for input(s): "INR", "PROTIME" in the last 168 hours. COVID-19 Labs  No results for input(s): "DDIMER", "FERRITIN", "LDH", "CRP" in the last 72 hours.  Lab Results  Component Value Date   SARSCOV2NAA NEGATIVE 06/01/2022   [place] NEGATIVE 05/07/2022    CBC: No results for input(s): "WBC", "NEUTROABS", "HGB", "HCT", "MCV", "PLT" in the last 168 hours.  Cardiac Enzymes: No results for input(s): "CKTOTAL", "CKMB", "CKMBINDEX", "TROPONINI" in the last 168 hours. BNP (last 3 results) No results for input(s): "PROBNP" in the last 8760 hours. CBG: Recent Labs  Lab 06/28/22 1230 06/28/22 1550 06/29/22 0836 06/29/22 0929 06/29/22 0930  GLUCAP 97 84 64* 69* 72    D-Dimer: No results for input(s): "DDIMER" in the last 72 hours. Hgb A1c: No results for input(s): "HGBA1C" in the last 72 hours.  Lipid Profile: No results for input(s):  "CHOL", "HDL", "LDLCALC", "TRIG", "CHOLHDL", "LDLDIRECT" in the last 72 hours. Thyroid function studies: No results for input(s): "TSH", "T4TOTAL", "T3FREE", "THYROIDAB" in the last 72 hours.  Invalid input(s): "FREET3" Anemia work up: No results for input(s): "VITAMINB12", "FOLATE", "FERRITIN", "TIBC", "IRON", "RETICCTPCT" in the last 72 hours. Sepsis Labs: No results for input(s): "PROCALCITON", "WBC", "LATICACIDVEN" in the last 168 hours.  Microbiology No results found for this or any previous visit (from the past 240 hour(s)).   Medications:    atorvastatin  40 mg Oral Daily   Chlorhexidine Gluconate Cloth  6 each Topical Daily   cholecalciferol  1,000 Units Oral Daily   donepezil  5 mg Oral QHS   feeding supplement  237 mL Oral BID BM   ferrous sulfate  325 mg Oral Q breakfast   fluconazole  100 mg Oral Daily   gabapentin  200 mg Oral TID   lidocaine  1 patch Transdermal Q24H   lidocaine  1 patch Transdermal Q24H   magnesium gluconate  500 mg Oral BID   metoprolol succinate  25 mg Oral Daily  morphine  15 mg Oral Q12H   pantoprazole  40 mg Oral Daily   polyethylene glycol  17 g Oral BID   senna-docusate  1 tablet Oral BID   tamsulosin  0.4 mg Oral Daily   Continuous Infusions:     LOS: 39 days   Marinda Elk  [facility]  07/04/2022, 7:56 AM

## 2022-07-05 NOTE — Progress Notes (Addendum)
[facility] PROGRESS NOTE    Progress Note  Wayne Stokes  LKG:[id number] DOB: 1966/06/09 DOA: 05/26/2022 PCP: [name], NP     Brief Narrative:   Wayne Stokes is an 56 y.o. male from [place], diabetes mellitus type 2, essential hypertension with lumbar disc disease with spinal stenosis, T12 compression fracture and mild cognitive impairment (the patient has no family or relatives in the [place]) admitted for uncontrolled back pain and severe spinal lumbar stenosis orthopedic, neurosurgery evaluated the patient and recommended TLC L for pain control and outpatient follow-up.  Physical therapy recommended inpatient rehab difficult to place due to lack of insurance. IR was consulted to perform kyphoplasty of T12 compression fracture on 06/18/2022 Patient is medically stable for transfer awaiting placement to skilled nursing facility. Still no bed availability.   Assessment/Plan:   Severe back pain/lumbar spinal stenosis and T12 compression fraction: MRI did not show any cord injury, neurosurgery was consulted recommended no surgical intervention the recommended DLCO. IR was consulted who perform kyphoplasty on 06/18/2022. Physical therapy evaluated the patient recommended skilled nursing facility is a challenge because he does not have insurance.  Leukocytosis/fever: Of unclear source now resolved. He was initially started on IV vancomycin and Rocephin Work-up was unrevealing, this was likely reactive due to T12 compression fracture MRI was done of the shoulder there was question septic arthritis ,IR aspirated again on 06/06/2022 that showed no signs of infection.   ID was consulted and antibiotics were discontinued.  Right shoulder pain: Continue oral narcotics and lidocaine patch follow-up with orthopedic surgery as an outpatient.  Abdominal discomfort/microcytic anemia/iron deficiency anemia: CT scan of the abdomen pelvis did not show any acute  findings. Hemoglobin has remained relatively stable.  Acute urinary retention/acute kidney injury: Felt to be secondary to back pain started on Flomax renal function stabilized after Foley was placed, has failed voiding trial twice. We will need to follow-up with urology as an outpatient.  Constipation: Resolved.  Vitamin D deficiency: Continue supplementation.  Hyponatremia: Likely chronic.  Hypokalemia/hypomagnesemia: Replete orally now resolved continue to monitor intermittently.  Sinus tachycardia: Resolved with metoprolol.  Diabetes mellitus type 2: Currently on sliding scale insulin has not required insulin for over 5 days. Stop checking CBGs.  Essential hypertension: Well-controlled on metoprolol.  Hyperlipidemia: Continue statins.    DVT prophylaxis: lovenox Family Communication:none Status is: Inpatient Remains inpatient appropriate because: Awaiting skilled nursing select placement.    Code Status:     Code Status Orders  (From admission, onward)           Start     Ordered   05/26/22 2254  Full code  Continuous        05/26/22 2254           Code Status History     This patient has a current code status but no historical code status.         IV Access:   Peripheral IV   Procedures and diagnostic studies:   No results found.   Medical Consultants:   None.   Subjective:   Patient was seen and examined today.  Denies any pain.  Resting comfortably. Objective:    Vitals:   07/04/22 1916 07/05/22 0410 07/05/22 0724 07/05/22 1419  BP: (!) 118/92 (!) 138/92 (!) 133/97 (!) 116/91  Pulse: (!) 102 96 98 89  Resp:  15 17 18   Temp: 98.8 F (37.1 C)  98.2 F (36.8 C) 97.7 F (36.5 C)  TempSrc:  Oral Oral  SpO2: 100% 100% 93% 94%  Weight:      Height:       SpO2: 94 % O2 Flow Rate (L/min): 2 L/min   Intake/Output Summary (Last 24 hours) at 07/05/2022 1630 Last data filed at 07/05/2022 0945 Gross per 24 hour   Intake 120 ml  Output 1600 ml  Net -1480 ml    Filed Weights   06/24/22 1407 06/25/22 0500 07/02/22 0500  Weight: 57.4 kg 58.3 kg 53.7 kg    Exam: General.  Malnourished gentleman, in no acute distress. Pulmonary.  Lungs clear bilaterally, normal respiratory effort. CV.  Regular rate and rhythm, no JVD, rub or murmur. Abdomen.  Soft, nontender, nondistended, BS positive. CNS.  Alert and oriented .  No focal neurologic deficit. Extremities.  No edema, no cyanosis, pulses intact and symmetrical. Psychiatry.  Judgment and insight appears normal.     Data Reviewed:    Labs: Basic Metabolic Panel: Recent Labs  Lab 06/29/22 0315  NA 134*  K 3.9  CL 98  CO2 27  GLUCOSE 82  BUN 6  CREATININE 0.86  CALCIUM 8.5*    GFR Estimated Creatinine Clearance: 67 mL/min (by C-G formula based on SCr of 0.86 mg/dL). Liver Function Tests: No results for input(s): "AST", "ALT", "ALKPHOS", "BILITOT", "PROT", "ALBUMIN" in the last 168 hours. No results for input(s): "LIPASE", "AMYLASE" in the last 168 hours. No results for input(s): "AMMONIA" in the last 168 hours. Coagulation profile No results for input(s): "INR", "PROTIME" in the last 168 hours. COVID-19 Labs  No results for input(s): "DDIMER", "FERRITIN", "LDH", "CRP" in the last 72 hours.  Lab Results  Component Value Date   SARSCOV2NAA NEGATIVE 06/01/2022   SARSCOV2NAA NEGATIVE 05/07/2022    CBC: No results for input(s): "WBC", "NEUTROABS", "HGB", "HCT", "MCV", "PLT" in the last 168 hours.  Cardiac Enzymes: No results for input(s): "CKTOTAL", "CKMB", "CKMBINDEX", "TROPONINI" in the last 168 hours. BNP (last 3 results) No results for input(s): "PROBNP" in the last 8760 hours. CBG: Recent Labs  Lab 06/29/22 0836 06/29/22 0929 06/29/22 0930  GLUCAP 64* 69* 72    D-Dimer: No results for input(s): "DDIMER" in the last 72 hours. Hgb A1c: No results for input(s): "HGBA1C" in the last 72 hours.  Lipid Profile: No  results for input(s): "CHOL", "HDL", "LDLCALC", "TRIG", "CHOLHDL", "LDLDIRECT" in the last 72 hours. Thyroid function studies: No results for input(s): "TSH", "T4TOTAL", "T3FREE", "THYROIDAB" in the last 72 hours.  Invalid input(s): "FREET3" Anemia work up: No results for input(s): "VITAMINB12", "FOLATE", "FERRITIN", "TIBC", "IRON", "RETICCTPCT" in the last 72 hours. Sepsis Labs: No results for input(s): "PROCALCITON", "WBC", "LATICACIDVEN" in the last 168 hours.  Microbiology No results found for this or any previous visit (from the past 240 hour(s)).   Medications:    atorvastatin  40 mg Oral Daily   Chlorhexidine Gluconate Cloth  6 each Topical Daily   cholecalciferol  1,000 Units Oral Daily   donepezil  5 mg Oral QHS   feeding supplement  237 mL Oral BID BM   ferrous sulfate  325 mg Oral Q breakfast   fluconazole  100 mg Oral Daily   gabapentin  200 mg Oral TID   lidocaine  1 patch Transdermal Q24H   lidocaine  1 patch Transdermal Q24H   magnesium gluconate  500 mg Oral BID   metoprolol succinate  25 mg Oral Daily   morphine  15 mg Oral Q12H   pantoprazole  40 mg Oral Daily  polyethylene glycol  17 g Oral BID   senna-docusate  1 tablet Oral BID   tamsulosin  0.4 mg Oral Daily   Continuous Infusions:  Time spent.  38 minutes  This record has been created using Conservation officer, historic buildings. Errors have been sought and corrected,but may not always be located. Such creation errors do not reflect on the standard of care.    LOS: 40 days   Jameyah Fennewald  [facility]  07/05/2022, 4:30 PM

## 2022-07-06 NOTE — Progress Notes (Signed)
[facility] PROGRESS NOTE    Progress Note  Wayne Stokes  [id number]:[id number] DOB: December 09, 1965 DOA: 05/26/2022 PCP: [name], NP     Brief Narrative:   Wayne Stokes is an 56 y.o. male from [place] speaking, diabetes mellitus type 2, essential hypertension with lumbar disc disease with spinal stenosis, T12 compression fracture and mild cognitive impairment (the patient has no family or relatives in the [place]) admitted for uncontrolled back pain and severe spinal lumbar stenosis orthopedic, neurosurgery evaluated the patient and recommended TLC L for pain control and outpatient follow-up.  Physical therapy recommended inpatient rehab difficult to place due to lack of insurance. IR was consulted to perform kyphoplasty of T12 compression fracture on 06/18/2022 Patient is medically stable for transfer awaiting placement to skilled nursing facility. Still no bed availability.   Assessment/Plan:   Severe back pain/lumbar spinal stenosis and T12 compression fraction: MRI did not show any cord injury, neurosurgery was consulted recommended no surgical intervention the recommended DLCO. IR was consulted who perform kyphoplasty on 06/18/2022. Physical therapy evaluated the patient recommended skilled nursing facility is a challenge because he does not have insurance.  Leukocytosis/fever: Of unclear source now resolved. He was initially started on IV vancomycin and Rocephin Work-up was unrevealing, this was likely reactive due to T12 compression fracture MRI was done of the shoulder there was question septic arthritis ,IR aspirated again on 06/06/2022 that showed no signs of infection.   ID was consulted and antibiotics were discontinued.  Right shoulder pain: Continue oral narcotics and lidocaine patch follow-up with orthopedic surgery as an outpatient.  Abdominal discomfort/microcytic anemia/iron deficiency anemia: CT scan of the abdomen pelvis did not show any acute  findings. Hemoglobin has remained relatively stable.  Acute urinary retention/acute kidney injury: Felt to be secondary to back pain started on Flomax renal function stabilized after Foley was placed, has failed voiding trial twice. We will need to follow-up with urology as an outpatient.  Constipation: Resolved.  Vitamin D deficiency: Continue supplementation.  Hyponatremia: Likely chronic.  Hypokalemia/hypomagnesemia: Replete orally now resolved continue to monitor intermittently.  Sinus tachycardia: Resolved with metoprolol.  Diabetes mellitus type 2: Currently on sliding scale insulin has not required insulin for over 5 days. Stop checking CBGs.  Essential hypertension: Well-controlled on metoprolol.  Hyperlipidemia: Continue statins.    DVT prophylaxis: lovenox Family Communication:none Status is: Inpatient Remains inpatient appropriate because: Awaiting skilled nursing select placement.    Code Status:     Code Status Orders  (From admission, onward)           Start     Ordered   05/26/22 2254  Full code  Continuous        05/26/22 2254           Code Status History     This patient has a current code status but no historical code status.         IV Access:   Peripheral IV   Procedures and diagnostic studies:   No results found.   Medical Consultants:   None.   Subjective:  Patient was seen and examined today.  No new complaints or concerns.  Objective:    Vitals:   07/05/22 1419 07/05/22 2122 07/06/22 0548 07/06/22 1000  BP: (!) 116/91 (!) 122/96 112/82 104/77  Pulse: 89 88 91 88  Resp: 18 16 17 18   Temp: 97.7 F (36.5 C) 98.6 F (37 C) 98.3 F (36.8 C) 98.8 F (37.1 C)  TempSrc: Oral Oral Oral  Oral  SpO2: 94% 96% 91% 96%  Weight:      Height:       SpO2: 96 % O2 Flow Rate (L/min): 2 L/min   Intake/Output Summary (Last 24 hours) at 07/06/2022 1433 Last data filed at 07/06/2022 0857 Gross per 24 hour   Intake --  Output 400 ml  Net -400 ml    Filed Weights   06/24/22 1407 06/25/22 0500 07/02/22 0500  Weight: 57.4 kg 58.3 kg 53.7 kg    Exam: General. Malnourished gentleman, in no acute distress. Pulmonary.  Lungs clear bilaterally, normal respiratory effort. CV.  Regular rate and rhythm, no JVD, rub or murmur. Abdomen.  Soft, nontender, nondistended, BS positive. CNS.  Alert and oriented .  No focal neurologic deficit. Extremities.  No edema, no cyanosis, pulses intact and symmetrical. Psychiatry.  Judgment and insight appears normal.   Data Reviewed:    Labs: Basic Metabolic Panel: No results for input(s): "NA", "K", "CL", "CO2", "GLUCOSE", "BUN", "CREATININE", "CALCIUM", "MG", "PHOS" in the last 168 hours.  GFR Estimated Creatinine Clearance: 67 mL/min (by C-G formula based on SCr of 0.86 mg/dL). Liver Function Tests: No results for input(s): "AST", "ALT", "ALKPHOS", "BILITOT", "PROT", "ALBUMIN" in the last 168 hours. No results for input(s): "LIPASE", "AMYLASE" in the last 168 hours. No results for input(s): "AMMONIA" in the last 168 hours. Coagulation profile No results for input(s): "INR", "PROTIME" in the last 168 hours. COVID-19 Labs  No results for input(s): "DDIMER", "FERRITIN", "LDH", "CRP" in the last 72 hours.  Lab Results  Component Value Date   SARSCOV2NAA NEGATIVE 06/01/2022   SARSCOV2NAA NEGATIVE 05/07/2022    CBC: No results for input(s): "WBC", "NEUTROABS", "HGB", "HCT", "MCV", "PLT" in the last 168 hours.  Cardiac Enzymes: No results for input(s): "CKTOTAL", "CKMB", "CKMBINDEX", "TROPONINI" in the last 168 hours. BNP (last 3 results) No results for input(s): "PROBNP" in the last 8760 hours. CBG: No results for input(s): "GLUCAP" in the last 168 hours.  D-Dimer: No results for input(s): "DDIMER" in the last 72 hours. Hgb A1c: No results for input(s): "HGBA1C" in the last 72 hours.  Lipid Profile: No results for input(s): "CHOL", "HDL",  "LDLCALC", "TRIG", "CHOLHDL", "LDLDIRECT" in the last 72 hours. Thyroid function studies: No results for input(s): "TSH", "T4TOTAL", "T3FREE", "THYROIDAB" in the last 72 hours.  Invalid input(s): "FREET3" Anemia work up: No results for input(s): "VITAMINB12", "FOLATE", "FERRITIN", "TIBC", "IRON", "RETICCTPCT" in the last 72 hours. Sepsis Labs: No results for input(s): "PROCALCITON", "WBC", "LATICACIDVEN" in the last 168 hours.  Microbiology No results found for this or any previous visit (from the past 240 hour(s)).   Medications:    atorvastatin  40 mg Oral Daily   Chlorhexidine Gluconate Cloth  6 each Topical Daily   cholecalciferol  1,000 Units Oral Daily   donepezil  5 mg Oral QHS   feeding supplement  237 mL Oral BID BM   ferrous sulfate  325 mg Oral Q breakfast   fluconazole  100 mg Oral Daily   gabapentin  200 mg Oral TID   lidocaine  1 patch Transdermal Q24H   lidocaine  1 patch Transdermal Q24H   magnesium gluconate  500 mg Oral BID   metoprolol succinate  25 mg Oral Daily   morphine  15 mg Oral Q12H   pantoprazole  40 mg Oral Daily   polyethylene glycol  17 g Oral BID   senna-docusate  1 tablet Oral BID   tamsulosin  0.4 mg Oral Daily  Continuous Infusions:  Time spent.  36 minutes  This record has been created using Systems analyst. Errors have been sought and corrected,but may not always be located. Such creation errors do not reflect on the standard of care.    LOS: 41 days   [facility]  07/06/2022, 2:33 PM

## 2022-07-07 ENCOUNTER — Inpatient Hospital Stay (HOSPITAL_COMMUNITY): Payer: Medicaid Other

## 2022-07-07 LAB — BASIC METABOLIC PANEL
Anion gap: 12 (ref 5–15)
BUN: 9 mg/dL (ref 6–20)
CO2: 25 mmol/L (ref 22–32)
Calcium: 9.8 mg/dL (ref 8.9–10.3)
Chloride: 96 mmol/L — ABNORMAL LOW (ref 98–111)
Creatinine, Ser: 1.1 mg/dL (ref 0.61–1.24)
GFR, Estimated: >60 mL/min (ref >60)
Glucose, Bld: 76 mg/dL (ref 70–99)
Potassium: 3 mmol/L — ABNORMAL LOW (ref 3.5–5.1)
Sodium: 133 mmol/L — ABNORMAL LOW (ref 135–145)

## 2022-07-07 LAB — CBC WITH DIFFERENTIAL/PLATELET
Abs Immature Granulocytes: 0.03 10*3/uL (ref 0.00–0.07)
Basophils Absolute: 0.1 10*3/uL (ref 0.0–0.1)
Basophils Relative: 1 %
Eosinophils Absolute: 0.3 10*3/uL (ref 0.0–0.5)
Eosinophils Relative: 4 %
HCT: 26.9 % — ABNORMAL LOW (ref 39.0–52.0)
Hemoglobin: 8.5 g/dL — ABNORMAL LOW (ref 13.0–17.0)
Immature Granulocytes: 0 %
Lymphocytes Relative: 23 %
Lymphs Abs: 1.6 10*3/uL (ref 0.7–4.0)
MCH: 22.4 pg — ABNORMAL LOW (ref 26.0–34.0)
MCHC: 31.6 g/dL (ref 30.0–36.0)
MCV: 71 fL — ABNORMAL LOW (ref 80.0–100.0)
Monocytes Absolute: 1 10*3/uL (ref 0.1–1.0)
Monocytes Relative: 14 %
Neutro Abs: 4.1 10*3/uL (ref 1.7–7.7)
Neutrophils Relative %: 58 %
Platelets: 437 10*3/uL — ABNORMAL HIGH (ref 150–400)
RBC: 3.79 MIL/uL — ABNORMAL LOW (ref 4.22–5.81)
RDW: 21.4 % — ABNORMAL HIGH (ref 11.5–15.5)
WBC: 7.1 10*3/uL (ref 4.0–10.5)
nRBC: 0 % (ref 0.0–0.2)

## 2022-07-07 LAB — C-REACTIVE PROTEIN: CRP: 9 mg/dL — ABNORMAL HIGH (ref <1.0)

## 2022-07-07 LAB — SEDIMENTATION RATE: Sed Rate: 104 mm/hr — ABNORMAL HIGH (ref 0–16)

## 2022-07-07 MED ORDER — FLUCONAZOLE 100 MG PO TABS
100.0000 mg | ORAL_TABLET | Freq: Every day | ORAL | Status: AC
  Administered 2022-07-09 – 2022-07-14 (×6): 100 mg via ORAL
  Filled 2022-07-07 (×7): qty 1

## 2022-07-07 MED ORDER — OXYCODONE HCL 5 MG PO TABS: 10.0000 mg | ORAL_TABLET | ORAL | Status: DC | PRN

## 2022-07-07 MED ORDER — ACETAMINOPHEN 325 MG PO TABS
650.0000 mg | ORAL_TABLET | Freq: Four times a day (QID) | ORAL | Status: DC | PRN
  Administered 2022-07-20: 650 mg via ORAL
  Filled 2022-07-07: qty 2

## 2022-07-07 MED ORDER — METHOCARBAMOL 500 MG PO TABS
500.0000 mg | ORAL_TABLET | Freq: Three times a day (TID) | ORAL | Status: DC
  Administered 2022-07-08 – 2022-07-20 (×30): 500 mg via ORAL
  Filled 2022-07-07 (×34): qty 1

## 2022-07-07 NOTE — Plan of Care (Signed)
  Problem: Education: Goal: Knowledge of General Education information will improve Description: Including pain rating scale, medication(s)/side effects and non-pharmacologic comfort measures Outcome: Not Progressing   Problem: Health Behavior/Discharge Planning: Goal: Ability to manage health-related needs will improve Outcome: Not Progressing   Problem: Clinical Measurements: Goal: Ability to maintain clinical measurements within normal limits will improve Outcome: Not Progressing Goal: Diagnostic test results will improve Outcome: Not Progressing   Problem: Activity: Goal: Risk for activity intolerance will decrease Outcome: Not Progressing   Problem: Elimination: Goal: Will not experience complications related to bowel motility Outcome: Not Progressing Goal: Will not experience complications related to urinary retention Outcome: Not Progressing   Problem: Pain Managment: Goal: General experience of comfort will improve Outcome: Not Progressing

## 2022-07-07 NOTE — Progress Notes (Signed)
Physical Therapy Treatment Patient Details Name: Wayne Stokes MRN: [id number] DOB: 1966/04/21 Today's Date: 07/07/2022   History of Present Illness 56 y.o. male presented 05/26/22 with c/o severe and progressively worsening low back pain that radiates into his left buttock and down his LLE. Imaging +acute T12 compression fracture and  L4/5 there is multifactorial severe spinal stenosis and severe bilateral foraminal stenosis. Neurosurgery consult with no acute surgical intervention needed. IR consulted for possible kyphoplasty bu twas delayed due to fever; S/p T12 KP with biopsy on 10/11. 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    PT Comments    Pt agitated today, deferring meds from RN beginning of session though did finally take pain meds. Pt c/o pain all over, especially his hands today. Brace donned EOB and pt ambulated 10' with RW and min A. Seated rest break taken and then pt encouraged to ambulate again. After 10' pt became more agitated and began to moan and try to sit with no surface behind him. Facilitation of hips to turn corner of bed and sit foot of bed. Pt not regarding safety concerns in any way. Max A needed to return to supine and position in bed. PT will continue to follow.    Recommendations for follow up therapy are one component of a multi-disciplinary discharge planning process, led by the attending physician.  Recommendations may be updated based on patient status, additional functional criteria and insurance authorization.  Follow Up Recommendations  Skilled nursing-short term rehab (<3 hours/day) Can patient physically be transported by private vehicle: No   Assistance Recommended at Discharge Frequent or constant Supervision/Assistance  Patient can return home with the following Assistance with cooking/housework;Direct supervision/assist for medications  management;Direct supervision/assist for financial management;Assist for transportation;Help with stairs or ramp for entrance;A lot of help with walking and/or transfers   Equipment Recommendations  BSC/3in1;Rolling walker (2 wheels)    Recommendations for Other Services       Precautions / Restrictions Precautions Precautions: Fall;Back Precaution Booklet Issued: No Precaution Comments: Back precautions for comfort Required Braces or Orthoses: Spinal Brace Spinal Brace: Lumbar corset Restrictions Weight Bearing Restrictions: No     Mobility  Bed Mobility Overal bed mobility: Needs Assistance Bed Mobility: Sidelying to Sit     Supine to sit: Min assist Sit to supine: Max assist   General bed mobility comments: min HHA to come to EOB. Pt moaning in pain and not assisting with return to supine, hence the need for max A    Transfers Overall transfer level: Needs assistance Equipment used: Rolling walker (2 wheels) Transfers: Sit to/from Stand Sit to Stand: Min assist           General transfer comment: min assist to power up from EOB    Ambulation/Gait Ambulation/Gait assistance: Min assist Gait Distance (Feet): 10 Feet (2x) Assistive device: Rolling walker (2 wheels) Gait Pattern/deviations: Step-through pattern, Decreased stride length, Wide base of support, Trunk flexed Gait velocity: decreased Gait velocity interpretation: <1.31 ft/sec, indicative of household ambulator   General Gait Details: pt needs cues for posture and safety, took one seated rest break and then after another 10' pt trying to sit without having anything behind him. Max support and facilitation needed to turn to foot of bed and sit onto bed.   Stairs             Wheelchair Mobility    Modified Rankin (Stroke Patients Only)  Balance Overall balance assessment: Needs assistance Sitting-balance support: Feet supported Sitting balance-Leahy Scale: Good Sitting balance -  Comments: min guard for safety, total A to donn brace   Standing balance support: Bilateral upper extremity supported, During functional activity Standing balance-Leahy Scale: Poor Standing balance comment: unsteady in standing and limited by pain                            Cognition Arousal/Alertness: Awake/alert Behavior During Therapy: Agitated (labile) Overall Cognitive Status: Impaired/Different from baseline Area of Impairment: Problem solving, Following commands, Awareness, Safety/judgement                 Orientation Level: Situation     Following Commands: Follows one step commands with increased time, Follows one step commands inconsistently Safety/Judgement: Decreased awareness of safety, Decreased awareness of deficits Awareness: Intellectual Problem Solving: Slow processing, Requires verbal cues, Requires tactile cues General Comments: pt very impulsive, trying to sit even without chair behind. Agitated today. Interpreter present but pt still not responding positively        Exercises      General Comments General comments (skin integrity, edema, etc.): reviewed back precautions and need for mobility but pt not really participating in education. Pt unwilling to sit up in chair.      Pertinent Vitals/Pain Pain Assessment Pain Assessment: Faces Faces Pain Scale: Hurts worst Pain Descriptors / Indicators: Grimacing, Guarding, Moaning Pain Intervention(s): Monitored during session, Limited activity within patient's tolerance, Premedicated before session    Home Living                          Prior Function            PT Goals (current goals can now be found in the care plan section) Acute Rehab PT Goals Patient Stated Goal: decreased pain PT Goal Formulation: With patient Time For Goal Achievement: 07/08/22 Potential to Achieve Goals: Fair Progress towards PT goals: Not progressing toward goals - comment (agitation)     Frequency    Min 3X/week      PT Plan Current plan remains appropriate    Co-evaluation              AM-PAC PT "6 Clicks" Mobility   Outcome Measure  Help needed turning from your back to your side while in a flat bed without using bedrails?: A Little Help needed moving from lying on your back to sitting on the side of a flat bed without using bedrails?: A Little Help needed moving to and from a bed to a chair (including a wheelchair)?: A Little Help needed standing up from a chair using your arms (e.g., wheelchair or bedside chair)?: A Lot Help needed to walk in hospital room?: A Lot Help needed climbing 3-5 steps with a railing? : Total 6 Click Score: 14    End of Session Equipment Utilized During Treatment: Back brace;Gait belt Activity Tolerance: Patient limited by pain Patient left: in bed;with call bell/phone within reach;with bed alarm set;Other (comment) Musician) Nurse Communication: Mobility status PT Visit Diagnosis: Other abnormalities of gait and mobility (R26.89);Muscle weakness (generalized) (M62.81)     Time: 2979-8921 PT Time Calculation (min) (ACUTE ONLY): 34 min  Charges:  $Gait Training: 8-22 mins $Therapeutic Activity: 8-22 mins                     Lyanne Co, PT  Acute Rehab Services  Secure chat preferred Office [place] 07/07/2022, 1:45 PM

## 2022-07-07 NOTE — Progress Notes (Signed)
  Progress Note Patient: Wayne Stokes [id number] DOB: [birth year] DOA: 05/26/2022  DOS: the patient was seen and examined on 07/07/2022  Brief hospital course: Wayne Stokes is a Wayne Stokes speaking 56 y.o. 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 Neurosurgery and orthopedics were consulted recommended TLSO brace and pain control. IR consulted for kyphoplasty.  Completed on 06/18/2022. There was concern for right shoulder septic arthritis.  ID was consulted orthopedics  consult.  Unable to aspirate the joint.  Antibiotics were discontinued.  Assessment and Plan: T12 compression fracture with back pain. Lumbar spinal stenosis. MRI negative for cord injury. Neurosurgery recommended conservative measures. IR was consulted and underwent kyphoplasty on 10/11. Continues to improve.  Chronic right shoulder pain. Patient having right shoulder pain. Orthopedic surgery was consulted unable to aspirate the joint. MRI was concerning for septic arthritis. Patient was actually given IV antibiotic. ID was consulted. Given work-up was negative ID discontinue the antibiotic. Currently continues to have pain in that area but also appears to have some redness of the right forearm and wrist area. We will get an x-ray. CRP and ESR elevated.  Abdominal discomfort. Work-up including CTs and x-rays negative for any acute abnormality. Monitor obstruction continue bowel regimen.  Acute urinary retention. Failed voiding trial.  Last one was on 10/1. We will attempt 1 more time if not successful will consult urology for further assistance.  Language barrier. Medication compliance. Patient continues to refuse taking his scheduled  medication within continues to complain of his pain. This is certainly related to significant language barrier. I do not think the patient is severely demented. Monitor.  Subjective: Reports pain in the right side of the arm and shoulder.  No nausea no vomiting no fever no chills.  Continues to have back pain.  Physical Exam: Vitals:   07/06/22 1000 07/06/22 2058 07/07/22 0725 07/07/22 1449  BP: 104/77 111/75 110/75 (!) 109/91  Pulse: 88 84 84 85  Resp: 18 18    Temp: 98.8 F (37.1 C) 98.7 F (37.1 C) (!) 97.5 F (36.4 C) 97.6 F (36.4 C)  TempSrc: Oral Oral Oral Oral  SpO2: 96% 93% 94% 97%  Weight:      Height:       General: Appear in moderate distress; no visible Abnormal Neck Mass Or lumps, Conjunctiva normal Cardiovascular: S1 and S2 Present, no Murmur, Respiratory: good respiratory effort, Bilateral Air entry present and CTA, no Crackles, no wheezes Abdomen: Bowel Sound present, Non tender  Extremities: no Pedal edema Neurology: alert and oriented to time, place, and person  Gait not checked due to patient safety concerns   Data Reviewed: I have Reviewed nursing notes, Vitals, and Lab results since pt's last encounter. Pertinent lab results CBC and CMP and ESR and CRP I have ordered test including CBC and CMP I have ordered imaging studies x-ray of wrist.   Family Communication: No one at bedside  Disposition: Status is: Inpatient Remains inpatient appropriate because: Unsafe discharge.  Currently awaiting placement.   SCDs Start: 05/26/22 2254   Level of care: Med-Surg Author: Berle Mull, MD 07/07/2022 6:59 PM Please look on [email or web address] to find out who is on call.

## 2022-07-07 NOTE — Progress Notes (Signed)
Patient Educated on importance of taking routine medications.  Translator in room.  Patient refused medication after education including his BP and HLD medications.

## 2022-07-08 MED ORDER — PHENAZOPYRIDINE HCL 100 MG PO TABS
100.0000 mg | ORAL_TABLET | Freq: Three times a day (TID) | ORAL | Status: DC
  Administered 2022-07-09 – 2022-07-10 (×3): 100 mg via ORAL
  Filled 2022-07-08 (×8): qty 1

## 2022-07-08 MED ORDER — TAMSULOSIN HCL 0.4 MG PO CAPS
0.4000 mg | ORAL_CAPSULE | Freq: Every day | ORAL | Status: DC
  Administered 2022-07-08 – 2022-07-31 (×21): 0.4 mg via ORAL
  Filled 2022-07-08 (×24): qty 1

## 2022-07-08 MED ORDER — PROCHLORPERAZINE EDISYLATE 10 MG/2ML IJ SOLN
5.0000 mg | Freq: Four times a day (QID) | INTRAMUSCULAR | Status: AC | PRN
  Administered 2022-07-08 – 2022-07-14 (×3): 5 mg via INTRAVENOUS
  Filled 2022-07-08 (×2): qty 2
  Filled 2022-07-08: qty 1

## 2022-07-08 NOTE — Progress Notes (Signed)
Patient informed of physician order to discontinue foley catheter at 0300. Refused. Danile, NP on call notified.

## 2022-07-08 NOTE — Plan of Care (Signed)
  Problem: Education: Goal: Knowledge of General Education information will improve Description: Including pain rating scale, medication(s)/side effects and non-pharmacologic comfort measures Outcome: Not Progressing   Problem: Health Behavior/Discharge Planning: Goal: Ability to manage health-related needs will improve Outcome: Not Progressing   Problem: Clinical Measurements: Goal: Ability to maintain clinical measurements within normal limits will improve Outcome: Not Progressing Goal: Diagnostic test results will improve Outcome: Not Progressing   Problem: Activity: Goal: Risk for activity intolerance will decrease Outcome: Not Progressing

## 2022-07-08 NOTE — Progress Notes (Signed)
Patient attempted to void twice.  Unsuccessful both times.  Bladder scan shows 292.  MD notified.  No new orders.  Will continue to monitor.

## 2022-07-08 NOTE — Progress Notes (Signed)
  Progress Note Patient: Wayne Stokes [id number] DOB: August 08, 1966 DOA: 05/26/2022  DOS: the patient was seen and examined on 07/08/2022  Brief hospital course: Wayne Stokes is a Wayne Stokes speaking 56 y.o. 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 Neurosurgery and orthopedics were consulted recommended TLSO brace and pain control. IR consulted for kyphoplasty.  Completed on 06/18/2022. There was concern for right shoulder septic arthritis.  ID was consulted orthopedics  consult.  Unable to aspirate the joint.  Antibiotics were discontinued.  Assessment and Plan: T12 compression fracture with back pain. Lumbar spinal stenosis. MRI negative for cord injury. Neurosurgery recommended conservative measures. IR was consulted and underwent kyphoplasty on 10/11. Continues to improve.  Chronic right shoulder pain. Patient having right shoulder pain. Orthopedic surgery was consulted unable to aspirate the joint. MRI was concerning for septic arthritis. Patient was actually given IV antibiotic. ID was consulted. Given work-up was negative ID discontinue the antibiotic. Continues to report pain in his right shoulder and right wrist area.  X-ray shoulder shows possibility of a persistent effusion.  X-ray right wrist shows possible inflammation. We will get MRI of wrist for further evaluation. CRP elevated but improving, ESR elevated and worsening. No leukocytosis no fever therefore currently ruling out infectious etiology based on prior work-up. Most likely this is inflammatory process and patient may benefit from initiation of some steroid therapy pending MRI.  Abdominal discomfort. Work-up including CTs and x-rays negative for  any acute abnormality. Monitor. continue bowel regimen.  Acute urinary retention. Failed voiding trial.  Last one was on 10/1. Patient has been on Flomax although refusing for last 2 days. Voiding trial initiated.  Will monitor.  Language barrier. Medication compliance. Patient continues to refuse taking his scheduled medication within continues to complain of his pain. This is certainly related to significant language barrier. I do not think the patient is severely demented. Monitor.  Subjective: Continues to report pain everywhere but refuses this medication.  No nausea no vomiting.  Physical Exam: Vitals:   07/07/22 1449 07/07/22 2029 07/08/22 0931 07/08/22 1333  BP: (!) 109/91 119/78 114/71 109/68  Pulse: 85 99 91 84  Resp:  _0 Temp: 97.6 F (36.4 C) 98.4 F (36.9 C) 98.2 F (36.8 C) 98.4 F (36.9 C)  TempSrc: Oral Oral Oral Oral  SpO2: 97% 94% 95% 96%  Weight:      Height:       General: Appear in mild distress; no visible Abnormal Neck Mass Or lumps, Conjunctiva normal Cardiovascular: S1 and S2 Present, no Murmur, Respiratory: good respiratory effort, Bilateral Air entry present and CTA, no Crackles, no wheezes Abdomen: Bowel Sound present, Non tender Extremities: no Pedal edema Neurology: alert and oriented to Self and place.  Data Reviewed: I have Reviewed nursing notes, Vitals, and Lab results since pt's last encounter. Pertinent lab results CBC and BMP I have ordered test including CBC and BMP I have ordered imaging studies MRI wrist.    Family Communication: No one at bedside  Disposition: Status is: Inpatient Remains inpatient appropriate because: Unsafe discharge.  Currently awaiting placement.   SCDs Start: 05/26/22 2254   Level of care: Med-Surg Author: Berle Mull, MD 07/08/2022 7:16 PM Please look on [email or web address] to find out who is on call.

## 2022-07-08 NOTE — Progress Notes (Signed)
Occupational Therapy Treatment Patient Details Name: Wayne Stokes MRN: [id number] DOB: 19-Sep-1965 Today's Date: 07/08/2022   History of present illness 56 y.o. male presented 05/26/22 with c/o severe and progressively worsening low back pain that radiates into his left buttock and down his LLE. Imaging +acute T12 compression fracture and  L4/5 there is multifactorial severe spinal stenosis and severe bilateral foraminal stenosis. Neurosurgery consult with no acute surgical intervention needed. IR consulted for possible kyphoplasty bu twas delayed due to fever; S/p T12 KP with biopsy on 10/11. 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   OT comments  Patient continues to be limited by pain, affecting progress. Patient asking to use BSC with min guard assist to get to EOB and assistance to donn back brace. Patient was min assist to stand and min guard to transfer. Patient unable to use bathroom and performed mobility in room before returning to bed. Patient to continue to be followed by acute OT.    Recommendations for follow up therapy are one component of a multi-disciplinary discharge planning process, led by the attending physician.  Recommendations may be updated based on patient status, additional functional criteria and insurance authorization.    Follow Up Recommendations  Skilled nursing-short term rehab (<3 hours/day)    Assistance Recommended at Discharge Frequent or constant Supervision/Assistance  Patient can return home with the following  A little help with walking and/or transfers;Assistance with cooking/housework;Assist for transportation;Help with stairs or ramp for entrance;A lot of help with bathing/dressing/bathroom;Direct supervision/assist for medications management   Equipment Recommendations  BSC/3in1    Recommendations for Other Services      Precautions /  Restrictions Precautions Precautions: Fall;Back Precaution Booklet Issued: No Precaution Comments: Back precautions for comfort Required Braces or Orthoses: Spinal Brace Spinal Brace: Lumbar corset       Mobility Bed Mobility Overal bed mobility: Needs Assistance Bed Mobility: Sidelying to Sit, Sit to Sidelying, Rolling Rolling: Min guard Sidelying to sit: Min guard     Sit to sidelying: Min assist General bed mobility comments: increased pain when returning to bed requiring min assist to return to sidelying    Transfers Overall transfer level: Needs assistance Equipment used: Rolling walker (2 wheels) Transfers: Sit to/from Stand Sit to Stand: Min assist           General transfer comment: min assist to stand from EOB and BSC with min guard for transfers     Balance Overall balance assessment: Needs assistance Sitting-balance support: Feet supported Sitting balance-Leahy Scale: Good     Standing balance support: Bilateral upper extremity supported, During functional activity Standing balance-Leahy Scale: Poor Standing balance comment: reliant on UE support                           ADL either performed or assessed with clinical judgement   ADL Overall ADL's : Needs assistance/impaired     Grooming: Wash/dry hands;Standing;Min guard                   Toilet Transfer: Min guard;Ambulation;BSC/3in1;Rolling walker (2 wheels) Toilet Transfer Details (indicate cue type and reason): cues for safety and hand placement           General ADL Comments: unable to use bathroom after sitting on [facility]    Extremity/Trunk Assessment Upper Extremity Assessment RUE Deficits / Details: right shoulder pain. Decreased grasp strength and finger dexerity  RUE Coordination: decreased fine motor;decreased gross motor LUE Deficits / Details: increased shoulder pain. Decreased grasp strength and finger dexerity LUE Coordination: decreased fine motor;decreased  gross motor            Vision       Perception     Praxis      Cognition Arousal/Alertness: Awake/alert Behavior During Therapy: Restless Overall Cognitive Status: Impaired/Different from baseline Area of Impairment: Problem solving, Following commands, Awareness, Safety/judgement                 Orientation Level: Situation     Following Commands: Follows one step commands with increased time, Follows one step commands inconsistently Safety/Judgement: Decreased awareness of safety, Decreased awareness of deficits Awareness: Intellectual Problem Solving: Slow processing, Requires verbal cues, Requires tactile cues General Comments: Patient moaning pointing at stomach stating he has had too much medicine        Exercises      Shoulder Instructions       General Comments      Pertinent Vitals/ Pain       Pain Assessment Pain Assessment: Faces Faces Pain Scale: Hurts whole lot Pain Location: back, BUE shoulders, stomach Pain Descriptors / Indicators: Grimacing, Guarding, Moaning Pain Intervention(s): Limited activity within patient's tolerance, Monitored during session, Repositioned  Home Living                                          Prior Functioning/Environment              Frequency  Min 2X/week        Progress Toward Goals  OT Goals(current goals can now be found in the care plan section)  Progress towards OT goals: Not progressing toward goals - comment (limited by pain)  Acute Rehab OT Goals Patient Stated Goal: feel better OT Goal Formulation: With patient Time For Goal Achievement: 07/10/22 Potential to Achieve Goals: Fair ADL Goals Pt Will Perform Grooming: standing;with supervision Pt Will Perform Lower Body Dressing: sit to/from stand;with supervision Pt Will Transfer to Toilet: with supervision;ambulating Pt Will Perform Toileting - Clothing Manipulation and hygiene: with supervision;sit to/from  stand Additional ADL Goal #1: Pt will perform bed mobility independently in preparation for ADLs. Additional ADL Goal #2: Pt will independent don/doff brace in preparation for ADLs Additional ADL Goal #3: Pt will use nurse call button when needing assistance 50% of time.  Plan Discharge plan remains appropriate    Co-evaluation                 AM-PAC OT "6 Clicks" Daily Activity     Outcome Measure   Help from another person eating meals?: None Help from another person taking care of personal grooming?: A Little Help from another person toileting, which includes using toliet, bedpan, or urinal?: A Little Help from another person bathing (including washing, rinsing, drying)?: A Little Help from another person to put on and taking off regular upper body clothing?: A Little Help from another person to put on and taking off regular lower body clothing?: A Little 6 Click Score: 19    End of Session Equipment Utilized During Treatment: Rolling walker (2 wheels);Back brace  OT Visit Diagnosis: Other abnormalities of gait and mobility (R26.89);Muscle weakness (generalized) (M62.81);Pain Pain - Right/Left: Right Pain - part of body: Shoulder   Activity Tolerance Patient limited by pain   Patient Left  in bed;with call bell/phone within reach;with bed alarm set   Nurse Communication Mobility status        Time: 6384-6659 OT Time Calculation (min): 21 min  Charges: OT General Charges $OT Visit: 1 Visit OT Treatments $Self Care/Home Management : 8-22 mins  Lodema Hong, [place]  Office [place] 07/08/2022, 2:39 PM

## 2022-07-08 NOTE — Progress Notes (Signed)
Mobility Specialist Progress Note   07/08/22 0900  Mobility  Activity Dangled on edge of bed;Stood at bedside  Level of Assistance Minimal assist, patient does 75% or more  Assistive Device Front wheel walker  Range of Motion/Exercises Active;All extremities  Activity Response Tolerated fair   Patient received in supine asleep and easily aroused. Slightly agitated but agreeable to participate in mobility. Assisted patient to EOB with minimal HHA and donned brace with min A. Patient then stood at EOB with min A but complained of pain and impulsively sat EOB. Deferred further mobility for unspecified reasons. Required mod A to assist patient into with supine. Was left with all needs met, call bell in reach.   Wayne Stokes, [place], [place]  Office: [phone]

## 2022-07-09 ENCOUNTER — Inpatient Hospital Stay (HOSPITAL_COMMUNITY): Payer: Medicaid Other

## 2022-07-09 MED ORDER — GADOBUTROL 1 MMOL/ML IV SOLN
5.0000 mL | Freq: Once | INTRAVENOUS | Status: AC | PRN
  Administered 2022-07-09: 5 mL via INTRAVENOUS

## 2022-07-09 MED ORDER — COLCHICINE 0.3 MG HALF TABLET
0.3000 mg | ORAL_TABLET | Freq: Two times a day (BID) | ORAL | Status: DC
  Administered 2022-07-09 – 2022-07-20 (×20): 0.3 mg via ORAL
  Filled 2022-07-09 (×23): qty 1

## 2022-07-09 NOTE — Progress Notes (Signed)
Mobility Specialist Progress Note   07/09/22 1454  Mobility  Activity Refused mobility   Pt difficult to arouse upon entry, RN expressing pt had just received pain meds. Once awake, pt deferring mobility and falling back asleep, unable to wake back up w/ verbal cues. Will follow up later if time permits.    Holland Falling Mobility Specialist Acute Rehab Office:  [phone]

## 2022-07-09 NOTE — Progress Notes (Addendum)
PT Cancellation Note  Patient Details Name: Wayne Stokes MRN: [id number] DOB: [birth year]   Cancelled Treatment:    Reason Eval/Treat Not Completed: (P) Patient at procedure or test/unavailable (pt off floor at MRI dept.) Attempt 10:11am pt at MRI dept. Attempt again 4:10 pm, pt sleeping and when awoken defers supine or OOB mobility. Called but unable to obtain translation services, will reattempt next date as schedule permits. Translation services # [phone] contact is Janett Billow to arrange for translation services.

## 2022-07-09 NOTE — Progress Notes (Signed)
[facility] PROGRESS NOTE    Progress Note  Wayne Stokes  TSV:[id number] DOB: 03-04-1966 DOA: 05/26/2022 PCP: [name], NP     Brief Narrative:   Wayne Stokes is an 56 y.o. male from [place] speaking, diabetes mellitus type 2, essential hypertension with lumbar disc disease with spinal stenosis, T12 compression fracture and mild cognitive impairment admitted for uncontrolled back pain and severe spinal lumbar stenosis orthopedic, neurosurgery evaluated the patient and recommended TLC L for pain control and outpatient follow-up.  Physical therapy recommended inpatient rehab difficult to place due to lack of insurance. IR was consulted to perform kyphoplasty of T12 compression fracture on 06/18/2022 Patient is medically stable for transfer awaiting placement to skilled nursing facility   Assessment/Plan:   Severe back pain/lumbar spinal stenosis and T12 compression fraction: MRI did not show any cord injury neurosurgery was consulted recommended no surgical intervention the recommended TLCO. IR was consulted to perform kyphoplasty on 06/18/2022. Physical therapy evaluated the patient recommended skilled nursing facility is a challenge because he does not have insurance.  Leukocytosis/fever: Of unclear source now resolved. He was initially started on IV vancomycin and Rocephin Work-up was unrevealing, there is likely reactive due to T12 compression fracture MRI was done of the shoulder there was question septic arthritis ,IR aspirated again on 06/06/2022 that showed no signs of infection.  ID was consulted and antibiotics were discontinued.  Chronic right shoulder pain: Continue oral narcotics follow-up with orthopedic surgery as an outpatient. Was complaining of right wrist pain MRI was ordered.  Abdominal discomfort/microcytic anemia/iron deficiency anemia: CT scan of the abdomen pelvis did not show any acute findings. Hemoglobin has remained relatively  stable.  Acute urinary retention/acute kidney injury: Felt to be secondary to back pain started on Flomax renal function stabilized after Foley was placed, has failed voiding trial twice. Foley was discontinued yesterday, continue Flomax able to urinate today.  Constipation: Resolved.  Vitamin D deficiency: Continue supplementation.  Hyponatremia: Likely chronic.  Hypokalemia/hypomagnesemia: Replete orally now resolved continue to monitor intermittently.  Sinus tachycardia: Resolved with metoprolol.  Diabetes mellitus type 2: Currently on sliding scale insulin has not required insulin for over 5 days. Stop checking CBGs.  Essential hypertension: Well-controlled on metoprolol.  Hyperlipidemia: Continue statins.    DVT prophylaxis: lovenox Family Communication:none Status is: Inpatient Remains inpatient appropriate because: Awaiting skilled nursing select placement.    Code Status:     Code Status Orders  (From admission, onward)           Start     Ordered   05/26/22 2254  Full code  Continuous        05/26/22 2254           Code Status History     This patient has a current code status but no historical code status.         IV Access:   Peripheral IV   Procedures and diagnostic studies:   DG Shoulder Right Port  Result Date: 07/07/2022 CLINICAL DATA:  Right shoulder pain. EXAM: RIGHT SHOULDER - 1 VIEW COMPARISON:  Shoulder MRI 1 month ago, 06/04/2022, radiograph 06/01/2022. FINDINGS: Glenohumeral joint space narrowing with inferior spurring of the humeral head/neck. No definite bony destructive change or erosions. Chronic changes at the acromioclavicular joint. Inferior positioning of the humeral head may be seen in the setting of joint effusion. IMPRESSION: 1. Glenohumeral osteoarthritis. 2. Inferior positioning of the humeral head may be seen with joint effusion. Patient with recent MRI findings suspicious  for septic arthritis. No  radiographic findings of osteomyelitis. 3. Chronic degenerative change at the acromioclavicular joint. Electronically Signed   By: Keith Rake M.D.   On: 07/07/2022 16:15   DG Wrist Complete Right  Result Date: 07/07/2022 CLINICAL DATA:  Right wrist swelling. EXAM: RIGHT WRIST - COMPLETE 3+ VIEW COMPARISON:  None Available. FINDINGS: No acute fracture or dislocation. Cystic changes and possible erosion involving the ulna styloid. No additional erosive changes. Occasional cystic changes in the carpal bones, typically degenerative. Minimal radiocarpal spurring. There is mild generalized soft tissue edema. Vascular calcifications, no other soft tissue calcifications are seen. IMPRESSION: 1. Cystic changes and possible erosion involving the ulna styloid, suspicious for inflammatory arthropathy. Recommend clinical correlation for infection. 2. Mild generalized soft tissue edema. 3. Occasional cystic changes in the carpal bones, typically degenerative. Electronically Signed   By: Keith Rake M.D.   On: 07/07/2022 16:13     Medical Consultants:   None.   Subjective:    Wayne Stokes does not want to be bothered.  Objective:    Vitals:   07/08/22 0931 07/08/22 1333 07/08/22 2128 07/09/22 0745  BP: 114/71 109/68 (!) 130/103 (!) 136/93  Pulse: 91 84 (!) 106 (!) 106  Resp: 17 17 18 20   Temp: 98.2 F (36.8 C) 98.4 F (36.9 C) 97.7 F (36.5 C) 98.9 F (37.2 C)  TempSrc: Oral Oral Oral Oral  SpO2: 95% 96% 95% 97%  Weight:      Height:       SpO2: 97 % O2 Flow Rate (L/min): 2 L/min   Intake/Output Summary (Last 24 hours) at 07/09/2022 0836 Last data filed at 07/08/2022 2130 Gross per 24 hour  Intake 120 ml  Output 1200 ml  Net -1080 ml    Filed Weights   06/24/22 1407 06/25/22 0500 07/02/22 0500  Weight: 57.4 kg 58.3 kg 53.7 kg    Exam: General exam: In no acute distress. Respiratory system: Good air movement and clear to auscultation. Cardiovascular system: S1 & S2 heard,  RRR. No JVD. Gastrointestinal system: Abdomen is nondistended, soft and nontender.  Extremities: No pedal edema. Skin: No rashes, lesions or ulcers Psychiatry: Judgement and insight appear normal. Mood & affect appropriate.    Data Reviewed:    Labs: Basic Metabolic Panel: Recent Labs  Lab 07/07/22 1207  NA 133*  K 3.0*  CL 96*  CO2 25  GLUCOSE 76  BUN 9  CREATININE 1.10  CALCIUM 9.8    GFR Estimated Creatinine Clearance: 52.4 mL/min (by C-G formula based on SCr of 1.1 mg/dL). Liver Function Tests: No results for input(s): "AST", "ALT", "ALKPHOS", "BILITOT", "PROT", "ALBUMIN" in the last 168 hours. No results for input(s): "LIPASE", "AMYLASE" in the last 168 hours. No results for input(s): "AMMONIA" in the last 168 hours. Coagulation profile No results for input(s): "INR", "PROTIME" in the last 168 hours. COVID-19 Labs  Recent Labs    07/07/22 1207  CRP 9.0*    Lab Results  Component Value Date   SARSCOV2NAA NEGATIVE 06/01/2022   [place] NEGATIVE 05/07/2022    CBC: Recent Labs  Lab 07/07/22 1207  WBC 7.1  NEUTROABS 4.1  HGB 8.5*  HCT 26.9*  MCV 71.0*  PLT 437*    Cardiac Enzymes: No results for input(s): "CKTOTAL", "CKMB", "CKMBINDEX", "TROPONINI" in the last 168 hours. BNP (last 3 results) No results for input(s): "PROBNP" in the last 8760 hours. CBG: No results for input(s): "GLUCAP" in the last 168 hours.  D-Dimer: No results for  input(s): "DDIMER" in the last 72 hours. Hgb A1c: No results for input(s): "HGBA1C" in the last 72 hours.  Lipid Profile: No results for input(s): "CHOL", "HDL", "LDLCALC", "TRIG", "CHOLHDL", "LDLDIRECT" in the last 72 hours. Thyroid function studies: No results for input(s): "TSH", "T4TOTAL", "T3FREE", "THYROIDAB" in the last 72 hours.  Invalid input(s): "FREET3" Anemia work up: No results for input(s): "VITAMINB12", "FOLATE", "FERRITIN", "TIBC", "IRON", "RETICCTPCT" in the last 72 hours. Sepsis  Labs: Recent Labs  Lab 07/07/22 1207  WBC 7.1    Microbiology No results found for this or any previous visit (from the past 240 hour(s)).   Medications:    atorvastatin  40 mg Oral Daily   Chlorhexidine Gluconate Cloth  6 each Topical Daily   cholecalciferol  1,000 Units Oral Daily   donepezil  5 mg Oral QHS   feeding supplement  237 mL Oral BID BM   ferrous sulfate  325 mg Oral Q breakfast   fluconazole  100 mg Oral Daily   gabapentin  200 mg Oral TID   lidocaine  1 patch Transdermal Q24H   lidocaine  1 patch Transdermal Q24H   magnesium gluconate  500 mg Oral BID   methocarbamol  500 mg Oral TID   metoprolol succinate  25 mg Oral Daily   morphine  15 mg Oral Q12H   pantoprazole  40 mg Oral Daily   phenazopyridine  100 mg Oral TID WC   polyethylene glycol  17 g Oral BID   senna-docusate  1 tablet Oral BID   tamsulosin  0.4 mg Oral QPC supper   Continuous Infusions:     LOS: 44 days   Marinda Elk  [facility]  07/09/2022, 8:36 AM

## 2022-07-10 LAB — CBC WITH DIFFERENTIAL/PLATELET
Abs Immature Granulocytes: 0 10*3/uL (ref 0.00–0.07)
Basophils Absolute: 0.1 10*3/uL (ref 0.0–0.1)
Basophils Relative: 2 %
Eosinophils Absolute: 0.5 10*3/uL (ref 0.0–0.5)
Eosinophils Relative: 7 %
HCT: 28.6 % — ABNORMAL LOW (ref 39.0–52.0)
Hemoglobin: 9.3 g/dL — ABNORMAL LOW (ref 13.0–17.0)
Lymphocytes Relative: 3 %
Lymphs Abs: 0.2 10*3/uL — ABNORMAL LOW (ref 0.7–4.0)
MCH: 22.7 pg — ABNORMAL LOW (ref 26.0–34.0)
MCHC: 32.5 g/dL (ref 30.0–36.0)
MCV: 69.8 fL — ABNORMAL LOW (ref 80.0–100.0)
Monocytes Absolute: 0 10*3/uL — ABNORMAL LOW (ref 0.1–1.0)
Monocytes Relative: 0 %
Neutro Abs: 6.5 10*3/uL (ref 1.7–7.7)
Neutrophils Relative %: 88 %
Platelets: 426 10*3/uL — ABNORMAL HIGH (ref 150–400)
RBC: 4.1 MIL/uL — ABNORMAL LOW (ref 4.22–5.81)
RDW: 21.4 % — ABNORMAL HIGH (ref 11.5–15.5)
WBC: 7.4 10*3/uL (ref 4.0–10.5)
nRBC: 0 % (ref 0.0–0.2)
nRBC: 0 /100 WBC

## 2022-07-10 MED ORDER — TRAMADOL HCL 50 MG PO TABS: 50.0000 mg | ORAL_TABLET | Freq: Four times a day (QID) | ORAL | Status: DC | PRN

## 2022-07-10 MED ORDER — POTASSIUM CHLORIDE CRYS ER 20 MEQ PO TBCR
40.0000 meq | EXTENDED_RELEASE_TABLET | Freq: Two times a day (BID) | ORAL | Status: AC
  Administered 2022-07-10 – 2022-07-11 (×2): 40 meq via ORAL
  Filled 2022-07-10 (×2): qty 2

## 2022-07-10 NOTE — Progress Notes (Signed)
Nutrition Follow-up  DOCUMENTATION CODES:   Not applicable  INTERVENTION:  Initiate 48 hour calorie count Encourage adequate PO intake Continue Ensure Enlive po BID, each supplement provides 350 kcal and 20 grams of protein. Request weekly weights starting today  NUTRITION DIAGNOSIS:   Increased nutrient needs related to post-op healing as evidenced by other (comment) (Compression fracture).  Ongoing  GOAL:   Patient will meet greater than or equal to 90% of their needs  Goal unmet  MONITOR:   PO intake  REASON FOR ASSESSMENT:   Consult Assessment of nutrition requirement/status  ASSESSMENT:   56 y.o. male admits related to evaluation of worsening lower back pain. PMH includes: T2DM, HTN, anemia, AKI. 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  Spoke with pt at bedside utilizing interpreter services. Pt difficult to keep on task with questions. It has been unclear what is the over arching contributing factor to his poor PO intake. He does report that he has had episodes of emesis but it is unclear how long and how frequently this has been occurring. He states that he has had emesis after meals in addition to abdominal pain in his middle, lower abdomen.   He has had visitors during admission but reports that they have been bringing food that he does not eat such as beef.   Plan to start a calorie count today to provide more objective insight into pt's actual PO intake given sporadic meal documentation. It is likely pt would benefit from supplemental nutrition support to meet his nutritional needs.   Meal completions: 0-20% x 5 recorded meals (10/26-10/29)  Admit weight 61.7 kg Last documented weight 10/25 was 53.7 kg Pt has transferred units during admission which could attribute to some weight loss however given pt's poor PO intake suspect that this could also  likely be true weight loss.   Medications: Vitamin D3, ferrous sulfate, magonate, protonix, miralax, senna  Last labs 1030: sodium 133, potassium, 3.0  Diet Order:   Diet Order             DIET DYS 3 Room service appropriate? No; Fluid consistency: Thin  Diet effective now                   EDUCATION NEEDS:   Education needs have been addressed  Skin:  Skin Assessment: Reviewed RN Assessment  Last BM:  10/28  Height:   Ht Readings from Last 1 Encounters:  06/01/22 5' (1.524 m)    Weight:   Wt Readings from Last 1 Encounters:  07/02/22 53.7 kg    Ideal Body Weight:  48.2 kg  BMI:  Body mass index is 23.12 kg/m.  Estimated Nutritional Needs:   Kcal:  1600-1800  Protein:  80-95g  Fluid:  >/=1.6L  Clayborne Dana, RDN, LDN Clinical Nutrition

## 2022-07-10 NOTE — Plan of Care (Signed)
  Problem: Pain Managment: Goal: General experience of comfort will improve Outcome: Progressing   Problem: Safety: Goal: Ability to remain free from injury will improve Outcome: Progressing   Problem: Skin Integrity: Goal: Risk for impaired skin integrity will decrease Outcome: Progressing   

## 2022-07-10 NOTE — Progress Notes (Signed)
[facility] PROGRESS NOTE    Progress Note  Wayne Stokes  YNW:[id number] DOB: February 10, 1966 DOA: 05/26/2022 PCP: [name], NP     Brief Narrative:   Wayne Stokes is an 56 y.o. male from [place] speaking, diabetes mellitus type 2, essential hypertension with lumbar disc disease with spinal stenosis, T12 compression fracture and mild cognitive impairment admitted for uncontrolled back pain and severe spinal lumbar stenosis orthopedic, neurosurgery evaluated the patient and recommended TLC L for pain control and outpatient follow-up.  Physical therapy recommended inpatient rehab difficult to place due to lack of insurance. IR was consulted to perform kyphoplasty of T12 compression fracture on 06/18/2022 Patient is medically stable for transfer awaiting placement to skilled nursing facility   Assessment/Plan:   Severe back pain/lumbar spinal stenosis and T12 compression fraction: MRI did not show any cord injury neurosurgery was consulted recommended no surgical intervention the recommended TLCO. IR was consulted to perform kyphoplasty on 06/18/2022. Physical therapy evaluated the patient recommended skilled nursing facility is a challenge because he does not have insurance.  Leukocytosis/fever: Of unclear source now resolved. He was initially started on IV vancomycin and Rocephin Work-up was unrevealing, there is likely reactive due to T12 compression fracture MRI was done of the shoulder there was question septic arthritis ,IR aspirated again on 06/06/2022 that showed no signs of infection.  ID was consulted and antibiotics were discontinued.  Chronic right shoulder pain: Continue oral narcotics follow-up with orthopedic surgery as an outpatient. Was complaining of right wrist pain MRI showed inflammatory arthropathy no discrete drainable abscess. Has remained afebrile he relates his pain is controlled. To get CBC with differential  Abdominal discomfort/microcytic  anemia/iron deficiency anemia: CT scan of the abdomen pelvis did not show any acute findings. Hemoglobin has remained relatively stable.  Acute urinary retention/acute kidney injury: Felt to be secondary to back pain started on Flomax renal function stabilized after Foley was placed, has failed voiding trial twice. Foley was discontinued yesterday, continue Flomax able to urinate today.  Constipation: Resolved.  Vitamin D deficiency: Continue supplementation.  Hyponatremia: Likely chronic.  Hypokalemia/hypomagnesemia: Replete orally now resolved continue to monitor intermittently.  Sinus tachycardia: Resolved with metoprolol.  Diabetes mellitus type 2: Currently on sliding scale insulin has not required insulin for over 5 days. Stop checking CBGs.  Essential hypertension: Well-controlled on metoprolol.  Hyperlipidemia: Continue statins.    DVT prophylaxis: lovenox Family Communication:none Status is: Inpatient Remains inpatient appropriate because: Awaiting skilled nursing select placement.    Code Status:     Code Status Orders  (From admission, onward)           Start     Ordered   05/26/22 2254  Full code  Continuous        05/26/22 2254           Code Status History     This patient has a current code status but no historical code status.         IV Access:   Peripheral IV   Procedures and diagnostic studies:   MR WRIST RIGHT W WO CONTRAST  Result Date: 07/09/2022 CLINICAL DATA:  Wrist pain and swelling. EXAM: MR OF THE RIGHT WRIST WITHOUT AND WITH CONTRAST TECHNIQUE: Multiplanar multisequence MR imaging of the right wrist was performed both before and after the administration of intravenous contrast. CONTRAST:  64mL GADAVIST GADOBUTROL 1 MMOL/ML IV SOLN COMPARISON:  None Available. FINDINGS: Examination is limited by patient motion. MR findings suggest a significant inflammatory arthropathy  involving the wrist. There is a joint  effusion, synovitis and suspected erosive changes and marrow edema and osseous enhancement after contrast. Certainly could not exclude the possibility of septic arthritis and recommend correlation with clinical findings. No discrete drainable abscess is identified. Mild associated myofasciitis but no evidence of pyomyositis. No findings suspicious for septic tenosynovitis. IMPRESSION: 1. Inflammatory arthropathy versus septic arthritis. Recommend correlation with clinical findings. 2. No discrete drainable abscess is identified. Mild associated myofasciitis but no evidence of pyomyositis. No findings suspicious for septic tenosynovitis. Electronically Signed   By: Rudie Meyer M.D.   On: 07/09/2022 10:20     Medical Consultants:   None.   Subjective:    Wayne Stokes relates his pain is controlled today.  Objective:    Vitals:   07/09/22 0745 07/09/22 1706 07/09/22 2012 07/10/22 0706  BP: (!) 136/93 127/89 122/85 129/88  Pulse: (!) 106 96 92 82  Resp: 20 16 15    Temp: 98.9 F (37.2 C) (!) 97.4 F (36.3 C) 98 F (36.7 C) 98.7 F (37.1 C)  TempSrc: Oral Oral  Oral  SpO2: 97% 97% 100% 94%  Weight:      Height:       SpO2: 94 % O2 Flow Rate (L/min): 2 L/min   Intake/Output Summary (Last 24 hours) at 07/10/2022 0915 Last data filed at 07/10/2022 0600 Gross per 24 hour  Intake --  Output 200 ml  Net -200 ml    Filed Weights   06/24/22 1407 06/25/22 0500 07/02/22 0500  Weight: 57.4 kg 58.3 kg 53.7 kg    Exam: General exam: In no acute distress. Respiratory system: Good air movement and clear to auscultation. Cardiovascular system: S1 & S2 heard, RRR. No JVD. Gastrointestinal system: Abdomen is nondistended, soft and nontender.  Extremities: No pedal edema. Skin: No rashes, lesions or ulcers Psychiatry: Judgement and insight appear normal. Mood & affect appropriate.    Data Reviewed:    Labs: Basic Metabolic Panel: Recent Labs  Lab 07/07/22 1207  NA 133*  K 3.0*   CL 96*  CO2 25  GLUCOSE 76  BUN 9  CREATININE 1.10  CALCIUM 9.8    GFR Estimated Creatinine Clearance: 52.4 mL/min (by C-G formula based on SCr of 1.1 mg/dL). Liver Function Tests: No results for input(s): "AST", "ALT", "ALKPHOS", "BILITOT", "PROT", "ALBUMIN" in the last 168 hours. No results for input(s): "LIPASE", "AMYLASE" in the last 168 hours. No results for input(s): "AMMONIA" in the last 168 hours. Coagulation profile No results for input(s): "INR", "PROTIME" in the last 168 hours. COVID-19 Labs  Recent Labs    07/07/22 1207  CRP 9.0*     Lab Results  Component Value Date   SARSCOV2NAA NEGATIVE 06/01/2022   SARSCOV2NAA NEGATIVE 05/07/2022    CBC: Recent Labs  Lab 07/07/22 1207  WBC 7.1  NEUTROABS 4.1  HGB 8.5*  HCT 26.9*  MCV 71.0*  PLT 437*    Cardiac Enzymes: No results for input(s): "CKTOTAL", "CKMB", "CKMBINDEX", "TROPONINI" in the last 168 hours. BNP (last 3 results) No results for input(s): "PROBNP" in the last 8760 hours. CBG: No results for input(s): "GLUCAP" in the last 168 hours.  D-Dimer: No results for input(s): "DDIMER" in the last 72 hours. Hgb A1c: No results for input(s): "HGBA1C" in the last 72 hours.  Lipid Profile: No results for input(s): "CHOL", "HDL", "LDLCALC", "TRIG", "CHOLHDL", "LDLDIRECT" in the last 72 hours. Thyroid function studies: No results for input(s): "TSH", "T4TOTAL", "T3FREE", "THYROIDAB" in the last 72  hours.  Invalid input(s): "FREET3" Anemia work up: No results for input(s): "VITAMINB12", "FOLATE", "FERRITIN", "TIBC", "IRON", "RETICCTPCT" in the last 72 hours. Sepsis Labs: Recent Labs  Lab 07/07/22 1207  WBC 7.1    Microbiology No results found for this or any previous visit (from the past 240 hour(s)).   Medications:    atorvastatin  40 mg Oral Daily   Chlorhexidine Gluconate Cloth  6 each Topical Daily   cholecalciferol  1,000 Units Oral Daily   colchicine  0.3 mg Oral BID   donepezil  5  mg Oral QHS   feeding supplement  237 mL Oral BID BM   ferrous sulfate  325 mg Oral Q breakfast   fluconazole  100 mg Oral Daily   gabapentin  200 mg Oral TID   lidocaine  1 patch Transdermal Q24H   lidocaine  1 patch Transdermal Q24H   magnesium gluconate  500 mg Oral BID   methocarbamol  500 mg Oral TID   metoprolol succinate  25 mg Oral Daily   morphine  15 mg Oral Q12H   pantoprazole  40 mg Oral Daily   phenazopyridine  100 mg Oral TID WC   polyethylene glycol  17 g Oral BID   senna-docusate  1 tablet Oral BID   tamsulosin  0.4 mg Oral QPC supper   Continuous Infusions:     LOS: 45 days   Charlynne Cousins  [facility]  07/10/2022, 9:15 AM

## 2022-07-10 NOTE — Progress Notes (Signed)
Physical Therapy Treatment Patient Details Name: Wayne Stokes MRN: [id number] DOB: Jun 22, 1966 Today's Date: 07/10/2022   History of Present Illness 56 y.o. male presented 05/26/22 with c/o severe and progressively worsening low back pain that radiates into his left buttock and down his LLE. Imaging +acute T12 compression fracture and  L4/5 there is multifactorial severe spinal stenosis and severe bilateral foraminal stenosis. Neurosurgery consult with no acute surgical intervention needed. IR consulted for possible kyphoplasty bu twas delayed due to fever; S/p T12 KP with biopsy on 10/11. 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    PT Comments    Pt received in bed and agreeable to mobility today. Seemed excited to try out rollator and be able to sit when needed. Pt ambulated into hallway 50' before needing to sit due to back pain. Pt motivated to ambulated again after 3 min rest but after only 10' pt became nauseous and began to vomit. Pt assisted back to room and min A needed to pivot to recliner with 2 wheel RW. RN present with nausea meds. Pt endorses abdominal pain and inability to have a BM x1 week. PT will continue to follow.    Recommendations for follow up therapy are one component of a multi-disciplinary discharge planning process, led by the attending physician.  Recommendations may be updated based on patient status, additional functional criteria and insurance authorization.  Follow Up Recommendations  Skilled nursing-short term rehab (<3 hours/day) Can patient physically be transported by private vehicle: Yes   Assistance Recommended at Discharge Frequent or constant Supervision/Assistance  Patient can return home with the following Assistance with cooking/housework;Direct supervision/assist for medications management;Direct supervision/assist for financial management;Assist  for transportation;Help with stairs or ramp for entrance;A lot of help with walking and/or transfers   Equipment Recommendations  BSC/3in1;Rollator (4 wheels)    Recommendations for Other Services       Precautions / Restrictions Precautions Precautions: Fall;Back Precaution Booklet Issued: No Precaution Comments: Back precautions for comfort Required Braces or Orthoses: Spinal Brace Spinal Brace: Lumbar corset Restrictions Weight Bearing Restrictions: No     Mobility  Bed Mobility Overal bed mobility: Needs Assistance Bed Mobility: Sidelying to Sit, Rolling Rolling: Min guard Sidelying to sit: Min guard       General bed mobility comments: pt able to come to EOB with min HHA. vc's for precautions for pain control    Transfers Overall transfer level: Needs assistance Equipment used: Rolling walker (2 wheels) Transfers: Sit to/from Stand Sit to Stand: Min assist   Step pivot transfers: Min assist       General transfer comment: min A from bed and rollator seat. Pt able to step to recliner with min A and 2 wheel RW. Pt nauseous end of session and needed more assist for safety    Ambulation/Gait Ambulation/Gait assistance: Min assist Gait Distance (Feet): 60 Feet (50', 10') Assistive device: Rollator (4 wheels) Gait Pattern/deviations: Step-through pattern, Decreased stride length, Wide base of support, Trunk flexed Gait velocity: decreased Gait velocity interpretation: <1.31 ft/sec, indicative of household ambulator   General Gait Details: having rollator did seem to give pt more confidence to ambulate further. Went 13' then needed seated rest due to back pain. Then went only 10' before becoming nauseous and starting to vomit   Stairs             Wheelchair Mobility    Modified Rankin (Stroke Patients Only)  Balance Overall balance assessment: Needs assistance Sitting-balance support: Feet supported Sitting balance-Leahy Scale: Good Sitting  balance - Comments: min guard for safety, total A to donn brace   Standing balance support: Bilateral upper extremity supported, During functional activity Standing balance-Leahy Scale: Poor Standing balance comment: reliant on UE support                            Cognition Arousal/Alertness: Awake/alert Behavior During Therapy: WFL for tasks assessed/performed Overall Cognitive Status: No family/caregiver present to determine baseline cognitive functioning Area of Impairment: Problem solving, Following commands, Awareness, Safety/judgement                 Orientation Level: Situation     Following Commands: Follows one step commands with increased time, Follows one step commands consistently, Follows multi-step commands inconsistently Safety/Judgement: Decreased awareness of safety, Decreased awareness of deficits Awareness: Intellectual Problem Solving: Slow processing, Requires verbal cues, Requires tactile cues General Comments: pt following commands today but per interpreter he is not always making sense with conversation and sometimes needs redirection back to task        Exercises      General Comments General comments (skin integrity, edema, etc.): pt vomited 4+ times after ambulation, RN called and arrived with zophran. Pt's teeth chattering and he stated he was cold.      Pertinent Vitals/Pain Pain Assessment Pain Assessment: Faces Faces Pain Scale: Hurts whole lot Pain Location: back, R shoulder and abdomen Pain Descriptors / Indicators: Grimacing, Guarding, Moaning Pain Intervention(s): Limited activity within patient's tolerance, Monitored during session    Home Living                          Prior Function            PT Goals (current goals can now be found in the care plan section) Acute Rehab PT Goals Patient Stated Goal: decreased pain PT Goal Formulation: With patient Time For Goal Achievement: 07/22/22 Potential to  Achieve Goals: Fair Progress towards PT goals: Progressing toward goals    Frequency    Min 3X/week      PT Plan Current plan remains appropriate;Equipment recommendations need to be updated    Co-evaluation              AM-PAC PT "6 Clicks" Mobility   Outcome Measure  Help needed turning from your back to your side while in a flat bed without using bedrails?: A Little Help needed moving from lying on your back to sitting on the side of a flat bed without using bedrails?: A Little Help needed moving to and from a bed to a chair (including a wheelchair)?: A Little Help needed standing up from a chair using your arms (e.g., wheelchair or bedside chair)?: A Lot Help needed to walk in hospital room?: A Lot Help needed climbing 3-5 steps with a railing? : Total 6 Click Score: 14    End of Session Equipment Utilized During Treatment: Back brace;Gait belt Activity Tolerance: Patient limited by pain;Other (comment) (limited by vomiting) Patient left: in chair;with call bell/phone within reach;Other (comment) Musician) Nurse Communication: Mobility status PT Visit Diagnosis: Other abnormalities of gait and mobility (R26.89);Muscle weakness (generalized) (M62.81)     Time: 9675-9163 PT Time Calculation (min) (ACUTE ONLY): 34 min  Charges:  $Gait Training: 23-37 mins  Leighton Roach, PT  Acute Rehab Services Secure chat preferred Office [place] 07/10/2022, 2:45 PM

## 2022-07-10 NOTE — Progress Notes (Signed)
Mobility Specialist Progress Note   07/10/22 1649  Mobility  Activity Transferred from chair to bed  Level of Assistance Minimal assist, patient does 75% or more  Assistive Device Front wheel walker  Distance Ambulated (ft) 2 ft  Activity Response Tolerated well  $Mobility charge 1 Mobility   Received in chair requesting to get back to bed. No physical assist needed but minA to donn pt's brace. No faults on transfer, left w/ call bell in reach and bed alarm on.    Holland Falling Mobility Specialist Acute Rehab Office:  [phone]

## 2022-07-11 LAB — RETICULOCYTES
Immature Retic Fract: 23.4 % — ABNORMAL HIGH (ref 2.3–15.9)
RBC.: 4.16 MIL/uL — ABNORMAL LOW (ref 4.22–5.81)
Retic Count, Absolute: 54.9 10*3/uL (ref 19.0–186.0)
Retic Ct Pct: 1.3 % (ref 0.4–3.1)

## 2022-07-11 LAB — BASIC METABOLIC PANEL
Anion gap: 12 (ref 5–15)
BUN: 9 mg/dL (ref 6–20)
CO2: 25 mmol/L (ref 22–32)
Calcium: 10.6 mg/dL — ABNORMAL HIGH (ref 8.9–10.3)
Chloride: 95 mmol/L — ABNORMAL LOW (ref 98–111)
Creatinine, Ser: 1.14 mg/dL (ref 0.61–1.24)
GFR, Estimated: >60 mL/min (ref >60)
Glucose, Bld: 86 mg/dL (ref 70–99)
Potassium: 3.8 mmol/L (ref 3.5–5.1)
Sodium: 132 mmol/L — ABNORMAL LOW (ref 135–145)

## 2022-07-11 LAB — VITAMIN B12: Vitamin B-12: 1751 pg/mL — ABNORMAL HIGH (ref 180–914)

## 2022-07-11 LAB — IRON AND TIBC
Iron: 49 ug/dL (ref 45–182)
Saturation Ratios: 42 % — ABNORMAL HIGH (ref 17.9–39.5)
TIBC: 118 ug/dL — ABNORMAL LOW (ref 250–450)
UIBC: 69 ug/dL

## 2022-07-11 LAB — HEPATIC FUNCTION PANEL
ALT: 7 U/L (ref 0–44)
AST: 33 U/L (ref 15–41)
Albumin: 1.5 g/dL — ABNORMAL LOW (ref 3.5–5.0)
Alkaline Phosphatase: 89 U/L (ref 38–126)
Bilirubin, Direct: 0.2 mg/dL (ref 0.0–0.2)
Indirect Bilirubin: 0.7 mg/dL (ref 0.3–0.9)
Total Bilirubin: 0.9 mg/dL (ref 0.3–1.2)
Total Protein: 7.7 g/dL (ref 6.5–8.1)

## 2022-07-11 LAB — FERRITIN: Ferritin: 257 ng/mL (ref 24–336)

## 2022-07-11 LAB — FOLATE: Folate: 10.7 ng/mL (ref >5.9)

## 2022-07-11 MED ORDER — POLYETHYLENE GLYCOL 3350 17 G PO PACK
17.0000 g | PACK | Freq: Two times a day (BID) | ORAL | Status: AC
  Administered 2022-07-11 – 2022-07-12 (×3): 17 g via ORAL
  Filled 2022-07-11 (×3): qty 1

## 2022-07-11 MED ORDER — SODIUM CHLORIDE 0.9 % IV SOLN: INTRAVENOUS | Status: AC

## 2022-07-11 MED ORDER — SODIUM CHLORIDE 0.9 % IV BOLUS
1000.0000 mL | Freq: Once | INTRAVENOUS | Status: AC
  Administered 2022-07-11: 1000 mL via INTRAVENOUS

## 2022-07-11 NOTE — Progress Notes (Signed)
[facility] PROGRESS NOTE    Progress Note  Wayne Stokes  OMV:[id number] DOB: [birth year] DOA: 05/26/2022 PCP: [name], NP     Brief Narrative:   Wayne Stokes is an 56 y.o. male from [place] speaking, diabetes mellitus type 2, essential hypertension with lumbar disc disease with spinal stenosis, T12 compression fracture and mild cognitive impairment admitted for uncontrolled back pain and severe spinal lumbar stenosis orthopedic, neurosurgery evaluated the patient and recommended TLC L for pain control and outpatient follow-up.  Physical therapy recommended inpatient rehab difficult to place due to lack of insurance. IR was consulted to perform kyphoplasty of T12 compression fracture on 06/18/2022 Patient is medically stable for transfer awaiting placement to skilled nursing facility   Assessment/Plan:   Severe back pain/lumbar spinal stenosis and T12 compression fraction: MRI did not show any cord injury neurosurgery was consulted recommended no surgical intervention the recommended TLCO. IR was consulted to perform kyphoplasty on 06/18/2022. Physical therapy evaluated the patient recommended skilled nursing facility is a challenge because he does not have insurance.  Leukocytosis/fever: Of unclear source now resolved. He was initially started on IV vancomycin and Rocephin Work-up was unrevealing, there is likely reactive due to T12 compression fracture MRI was done of the shoulder there was question septic arthritis ,IR aspirated again on 06/06/2022 that showed no signs of infection.  ID was consulted and antibiotics were discontinued.  Chronic right shoulder pain: Continue oral narcotics follow-up with orthopedic surgery as an outpatient. Was complaining of right wrist pain MRI showed inflammatory arthropathy no discrete drainable abscess. Has remained afebrile he relates his pain is controlled. CBC with differential showed no leukocytosis no left shift.  Abdominal  discomfort/microcytic anemia/iron deficiency anemia: CT scan of the abdomen pelvis did not show any acute findings. Hemoglobin has remained relatively stable.  Acute urinary retention/acute kidney injury: Felt to be secondary to back pain started on Flomax renal function stabilized after Foley was placed, has failed voiding trial twice. Foley was discontinued 2 days ago now he relates he needs to urinate and cannot. We will place a Foley again bladder scan showed greater than 500 cc  Constipation: Restart him on MiraLAX p.o. twice daily.  Microcytic anemia: Platelet count 437, check an anemia panel, he is already on ferrous sulfate  Vitamin D deficiency: Continue supplementation.  Hyponatremia: Likely chronic.  Hypokalemia/hypomagnesemia: Replete orally now resolved continue to monitor intermittently.  Sinus tachycardia: Resolved with metoprolol.  Diabetes mellitus type 2: Currently on sliding scale insulin has not required insulin for over 5 days. Stop checking CBGs.  Essential hypertension: Well-controlled on metoprolol.  Hyperlipidemia: Continue statins.    DVT prophylaxis: lovenox Family Communication:none Status is: Inpatient Remains inpatient appropriate because: Awaiting skilled nursing select placement.    Code Status:     Code Status Orders  (From admission, onward)           Start     Ordered   05/26/22 2254  Full code  Continuous        05/26/22 2254           Code Status History     This patient has a current code status but no historical code status.         IV Access:   Peripheral IV   Procedures and diagnostic studies:   MR WRIST RIGHT W WO CONTRAST  Result Date: 07/09/2022 CLINICAL DATA:  Wrist pain and swelling. EXAM: MR OF THE RIGHT WRIST WITHOUT AND WITH CONTRAST TECHNIQUE: Multiplanar multisequence  MR imaging of the right wrist was performed both before and after the administration of intravenous contrast. CONTRAST:   21mL GADAVIST GADOBUTROL 1 MMOL/ML IV SOLN COMPARISON:  None Available. FINDINGS: Examination is limited by patient motion. MR findings suggest a significant inflammatory arthropathy involving the wrist. There is a joint effusion, synovitis and suspected erosive changes and marrow edema and osseous enhancement after contrast. Certainly could not exclude the possibility of septic arthritis and recommend correlation with clinical findings. No discrete drainable abscess is identified. Mild associated myofasciitis but no evidence of pyomyositis. No findings suspicious for septic tenosynovitis. IMPRESSION: 1. Inflammatory arthropathy versus septic arthritis. Recommend correlation with clinical findings. 2. No discrete drainable abscess is identified. Mild associated myofasciitis but no evidence of pyomyositis. No findings suspicious for septic tenosynovitis. Electronically Signed   By: Rudie Meyer M.D.   On: 07/09/2022 10:20     Medical Consultants:   None.   Subjective:    Wayne Stokes relates he can not urinate and it bothers him.  Objective:    Vitals:   07/10/22 0706 07/10/22 1556 07/10/22 1936 07/11/22 0728  BP: 129/88 113/86 109/81 139/82  Pulse: 82 94 91 87  Resp:   16 16  Temp: 98.7 F (37.1 C) 97.9 F (36.6 C) 98.2 F (36.8 C) 98.6 F (37 C)  TempSrc: Oral Oral  Oral  SpO2: 94% 97% 95% 96%  Weight:      Height:       SpO2: 96 % O2 Flow Rate (L/min): 2 L/min   Intake/Output Summary (Last 24 hours) at 07/11/2022 0845 Last data filed at 07/10/2022 1700 Gross per 24 hour  Intake 600 ml  Output 125 ml  Net 475 ml    Filed Weights   06/24/22 1407 06/25/22 0500 07/02/22 0500  Weight: 57.4 kg 58.3 kg 53.7 kg    Exam: General exam: In no acute distress. Respiratory system: Good air movement and clear to auscultation. Cardiovascular system: S1 & S2 heard, RRR. No JVD. Gastrointestinal system: Abdomen is nondistended, soft and nontender.  Extremities: No pedal edema. Skin:  No rashes, lesions or ulcers   Data Reviewed:    Labs: Basic Metabolic Panel: Recent Labs  Lab 07/07/22 1207  NA 133*  K 3.0*  CL 96*  CO2 25  GLUCOSE 76  BUN 9  CREATININE 1.10  CALCIUM 9.8    GFR Estimated Creatinine Clearance: 52.4 mL/min (by C-G formula based on SCr of 1.1 mg/dL). Liver Function Tests: No results for input(s): "AST", "ALT", "ALKPHOS", "BILITOT", "PROT", "ALBUMIN" in the last 168 hours. No results for input(s): "LIPASE", "AMYLASE" in the last 168 hours. No results for input(s): "AMMONIA" in the last 168 hours. Coagulation profile No results for input(s): "INR", "PROTIME" in the last 168 hours. COVID-19 Labs  No results for input(s): "DDIMER", "FERRITIN", "LDH", "CRP" in the last 72 hours.   Lab Results  Component Value Date   SARSCOV2NAA NEGATIVE 06/01/2022   SARSCOV2NAA NEGATIVE 05/07/2022    CBC: Recent Labs  Lab 07/07/22 1207 07/10/22 0951  WBC 7.1 7.4  NEUTROABS 4.1 6.5  HGB 8.5* 9.3*  HCT 26.9* 28.6*  MCV 71.0* 69.8*  PLT 437* 426*    Cardiac Enzymes: No results for input(s): "CKTOTAL", "CKMB", "CKMBINDEX", "TROPONINI" in the last 168 hours. BNP (last 3 results) No results for input(s): "PROBNP" in the last 8760 hours. CBG: No results for input(s): "GLUCAP" in the last 168 hours.  D-Dimer: No results for input(s): "DDIMER" in the last 72 hours. Hgb A1c:  No results for input(s): "HGBA1C" in the last 72 hours.  Lipid Profile: No results for input(s): "CHOL", "HDL", "LDLCALC", "TRIG", "CHOLHDL", "LDLDIRECT" in the last 72 hours. Thyroid function studies: No results for input(s): "TSH", "T4TOTAL", "T3FREE", "THYROIDAB" in the last 72 hours.  Invalid input(s): "FREET3" Anemia work up: No results for input(s): "VITAMINB12", "FOLATE", "FERRITIN", "TIBC", "IRON", "RETICCTPCT" in the last 72 hours. Sepsis Labs: Recent Labs  Lab 07/07/22 1207 07/10/22 0951  WBC 7.1 7.4    Microbiology No results found for this or any  previous visit (from the past 240 hour(s)).   Medications:    atorvastatin  40 mg Oral Daily   Chlorhexidine Gluconate Cloth  6 each Topical Daily   cholecalciferol  1,000 Units Oral Daily   colchicine  0.3 mg Oral BID   donepezil  5 mg Oral QHS   feeding supplement  237 mL Oral BID BM   ferrous sulfate  325 mg Oral Q breakfast   fluconazole  100 mg Oral Daily   gabapentin  200 mg Oral TID   lidocaine  1 patch Transdermal Q24H   lidocaine  1 patch Transdermal Q24H   magnesium gluconate  500 mg Oral BID   methocarbamol  500 mg Oral TID   metoprolol succinate  25 mg Oral Daily   morphine  15 mg Oral Q12H   pantoprazole  40 mg Oral Daily   polyethylene glycol  17 g Oral BID   senna-docusate  1 tablet Oral BID   tamsulosin  0.4 mg Oral QPC supper   Continuous Infusions:     LOS: 46 days   Marinda Elk  [facility]  07/11/2022, 8:45 AM

## 2022-07-11 NOTE — Progress Notes (Signed)
Physical Therapy Treatment Patient Details Name: Wayne Stokes MRN: [id number] DOB: [birth year] Today's Date: 07/11/2022   History of Present Illness 56 y.o. male presented 05/26/22 with c/o severe and progressively worsening low back pain that radiates into his left buttock and down his LLE. Imaging +acute T12 compression fracture and  L4/5 there is multifactorial severe spinal stenosis and severe bilateral foraminal stenosis. Neurosurgery consult with no acute surgical intervention needed. IR consulted for possible kyphoplasty bu twas delayed due to fever; S/p T12 KP with biopsy on 10/11. 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    PT Comments    Patient progressing well towards PT goals. Session focused on progressive ambulation and mobility. Requires Min A for bed mobility and standing as well as Min A for gait training with use of rollator for support today. Noted to require 2 seated rest breaks and 2 standing rest breaks due to pain, fatigue and weakness. Difficulty managing rollator brakes and safety when attempting to sit. No nausea or vomiting today with mobility. Continues to report pain in RUE and back. Total A needed to donn brace. Continues to be appropriate for SNF. Will follow.    Recommendations for follow up therapy are one component of a multi-disciplinary discharge planning process, led by the attending physician.  Recommendations may be updated based on patient status, additional functional criteria and insurance authorization.  Follow Up Recommendations  Skilled nursing-short term rehab (<3 hours/day) Can patient physically be transported by private vehicle: Yes   Assistance Recommended at Discharge Frequent or constant Supervision/Assistance  Patient can return home with the following Assistance with cooking/housework;Direct supervision/assist for medications  management;Direct supervision/assist for financial management;Assist for transportation;Help with stairs or ramp for entrance;A lot of help with walking and/or transfers   Equipment Recommendations  BSC/3in1;Rollator (4 wheels)    Recommendations for Other Services       Precautions / Restrictions Precautions Precautions: Fall;Back Precaution Booklet Issued: No Precaution Comments: Back precautions for comfort Required Braces or Orthoses: Spinal Brace Spinal Brace: Thoracolumbosacral orthotic Restrictions Weight Bearing Restrictions: No     Mobility  Bed Mobility Overal bed mobility: Needs Assistance Bed Mobility: Rolling, Sidelying to Sit Rolling: Min assist Sidelying to sit: Min assist, HOB elevated     Sit to sidelying: Min guard, HOB elevated General bed mobility comments: Step by step cues for sequencing and for log roll technique, assist with LEs and trunk to get to EOB.    Transfers Overall transfer level: Needs assistance Equipment used: Rollator (4 wheels) Transfers: Sit to/from Stand Sit to Stand: Min assist           General transfer comment: Min A to power to standing from EOB x1, from rollator seat x1, from bench x1.    Ambulation/Gait Ambulation/Gait assistance: Min assist Gait Distance (Feet): 60 Feet (+ 75' + 135') Assistive device: Rollator (4 wheels) Gait Pattern/deviations: Step-through pattern, Decreased stride length, Wide base of support, Trunk flexed Gait velocity: decreased     General Gait Details: Slow, mildly unsteady gait with flexed trunk, 2 seated rest breaks due to pain/fatigue, 2 standing rest breaks. Cues for proper use of rollator brakes.   Stairs             Wheelchair Mobility    Modified Rankin (Stroke Patients Only)       Balance Overall balance assessment: Needs assistance Sitting-balance support: Feet supported, No upper extremity supported Sitting balance-Leahy Scale:  Good Sitting balance - Comments:  total A to donn brace, min guard for safety.   Standing balance support: During functional activity, Bilateral upper extremity supported, Reliant on assistive device for balance Standing balance-Leahy Scale: Poor Standing balance comment: reliant on UE support                            Cognition Arousal/Alertness: Awake/alert Behavior During Therapy: Flat affect Overall Cognitive Status: No family/caregiver present to determine baseline cognitive functioning Area of Impairment: Safety/judgement, Following commands, Problem solving                       Following Commands: Follows multi-step commands inconsistently, Follows one step commands consistently Safety/Judgement: Decreased awareness of safety, Decreased awareness of deficits   Problem Solving: Slow processing, Requires verbal cues General Comments: pt following commands today.        Exercises      General Comments General comments (skin integrity, edema, etc.): Interpreter, Grover Canavan, present and translated for session.      Pertinent Vitals/Pain Pain Assessment Pain Assessment: Faces Faces Pain Scale: Hurts whole lot Pain Location: back, Rt shoulder Pain Descriptors / Indicators: Grimacing, Guarding, Discomfort Pain Intervention(s): Monitored during session, Repositioned, Limited activity within patient's tolerance    Home Living                          Prior Function            PT Goals (current goals can now be found in the care plan section) Progress towards PT goals: Progressing toward goals    Frequency    Min 3X/week      PT Plan Current plan remains appropriate;Equipment recommendations need to be updated    Co-evaluation              AM-PAC PT "6 Clicks" Mobility   Outcome Measure  Help needed turning from your back to your side while in a flat bed without using bedrails?: A Little Help needed moving from lying on your back to sitting on the side of  a flat bed without using bedrails?: A Little Help needed moving to and from a bed to a chair (including a wheelchair)?: A Little Help needed standing up from a chair using your arms (e.g., wheelchair or bedside chair)?: A Little Help needed to walk in hospital room?: A Little Help needed climbing 3-5 steps with a railing? : Total 6 Click Score: 16    End of Session Equipment Utilized During Treatment: Back brace;Gait belt Activity Tolerance: Patient limited by pain;Patient tolerated treatment well Patient left: in bed;with call bell/phone within reach;with bed alarm set;Other (comment) (interpreter present) Nurse Communication: Mobility status PT Visit Diagnosis: Other abnormalities of gait and mobility (R26.89);Muscle weakness (generalized) (M62.81);Pain Pain - Right/Left: Right Pain - part of body: Shoulder (back)     Time: 9811-9147 PT Time Calculation (min) (ACUTE ONLY): 32 min  Charges:  $Gait Training: 23-37 mins                     Vale Haven, PT, DPT Acute Rehabilitation Services Secure chat preferred Office [phone]      Blake Divine A Delmos Velaquez 07/11/2022, 3:22 PM

## 2022-07-11 NOTE — Progress Notes (Signed)
Calorie Count Note  Calorie count ordered.  Diet: dysphagia 3 Supplements: Ensure Enlive po BID, each supplement provides 350 kcal and 20 grams of protein.  Dinner: 61 kcal and 4g protein Breakfast: 99 kcal and 3g protein Lunch: 0% Supplements: 0%  Total intake: 160 kcal (10% of minimum estimated needs)  7 protein (9% of minimum estimated needs)  Nutrition Dx: Increased nutrient needs related to post-op healing as evidenced by other (comment) (Compression fracture).   Goal: Patient will meet greater than or equal to 90% of their needs  Intervention:  - D/t ongoing poor PO intake, recommend Cortrak placement to supplement nutritional intake - Continue current diet as ordered - Ensure Enlive po BID, each supplement provides 350 kcal and 20 grams of protein. - Weekly weights   Clayborne Dana, RDN, LDN Clinical Nutrition

## 2022-07-12 MED ORDER — FINASTERIDE 5 MG PO TABS
5.0000 mg | ORAL_TABLET | Freq: Every day | ORAL | Status: DC
  Administered 2022-07-12 – 2022-08-01 (×18): 5 mg via ORAL
  Filled 2022-07-12 (×21): qty 1

## 2022-07-12 MED ORDER — SODIUM CHLORIDE 0.9 % IV BOLUS
250.0000 mL | Freq: Once | INTRAVENOUS | Status: AC
  Administered 2022-07-12: 250 mL via INTRAVENOUS

## 2022-07-12 NOTE — TOC Progression Note (Signed)
Transition of Care [facility]) - Progression Note    Patient Details  Name: Wayne Stokes MRN: [id number] Date of Birth: [birth year]  Transition of [facility]) CM/SW Contact  Emeterio Reeve, [place] Phone Number: 07/12/2022, 12:53 PM  Clinical Narrative:     Pt has no bed offers.  Expected Discharge Plan: [place] Barriers to Discharge: Continued Medical Work up  Expected Discharge Plan and Services Expected Discharge Plan: [place] In-house Referral: Development worker, community Discharge Planning Services: CM Consult                                           Social Determinants of Health (SDOH) Interventions    Readmission Risk Interventions     No data to display         Emeterio Reeve, [place] Social Worker

## 2022-07-12 NOTE — Progress Notes (Signed)
   07/12/22 1348  Assess: MEWS Score  Temp 98.5 F (36.9 C)  BP (!) 83/58  MAP (mmHg) 67  Pulse Rate (!) 103  Resp 18  Level of Consciousness Alert  SpO2 98 %  O2 Device Room Air  Patient Activity (if Appropriate) In bed  Assess: MEWS Score  MEWS Temp 0  MEWS Systolic 1  MEWS Pulse 1  MEWS RR 0  MEWS LOC 0  MEWS Score 2  MEWS Score Color Yellow  Assess: if the MEWS score is Yellow or Red  Were vital signs taken at a resting state? Yes  Focused Assessment Change from prior assessment (see assessment flowsheet)  Does the patient meet 2 or more of the SIRS criteria? Yes  Does the patient have a confirmed or suspected source of infection? No  Provider and Rapid Response Notified? No  MEWS guidelines implemented *See Row Information* Yes  Treat  MEWS Interventions Other (Comment) (Dr. Olevia Bowens made aware, with orders made.)  Pain Scale 0-10  Pain Score 4  Pain Type Chronic pain  Pain Location Back  Pain Orientation Mid;Lower  Pain Descriptors / Indicators Aching;Discomfort  Pain Frequency Intermittent  Pain Onset On-going  Pain Intervention(s) Repositioned  Multiple Pain Sites No  Take Vital Signs  Increase Vital Sign Frequency  Yellow: Q 2hr X 2 then Q 4hr X 2, if remains yellow, continue Q 4hrs  Escalate  MEWS: Escalate Yellow: discuss with charge nurse/RN and consider discussing with provider and RRT  Notify: Charge Nurse/RN  Name of Charge Nurse/RN Notified Anita RN/CHarge RN  Date Charge Nurse/RN Notified 07/12/22  Time Charge Nurse/RN Notified 1403  Notify: Provider  Provider Name/Title Dr. Aileen Fass  Date Provider Notified 07/12/22  Time Provider Notified 1404  Method of Notification Call  Notification Reason Other (Comment) (yellow MEWS)  Provider response In department;See new orders  Date of Provider Response 07/12/22  Time of Provider Response 1404  Notify: Rapid Response  Name of Rapid Response RN Notified Not applicable  Document  Patient Outcome  Other (Comment) (Pt is stable and remained stable.  New orders carried out.)  Progress note created (see row info) Yes  Assess: SIRS CRITERIA  SIRS Temperature  0  SIRS Pulse 1  SIRS Respirations  0  SIRS WBC 1  SIRS Score Sum  2

## 2022-07-12 NOTE — Progress Notes (Addendum)
[facility] PROGRESS NOTE    Progress Note  Wayne Stokes  RCV:[id number] DOB: Jan 09, 1966 DOA: 05/26/2022 PCP: [name], NP     Brief Narrative:   Wayne Stokes is an 56 y.o. male from [place], diabetes mellitus type 2, essential hypertension with lumbar disc disease with spinal stenosis, T12 compression fracture and mild cognitive impairment admitted for uncontrolled back pain and severe spinal lumbar stenosis orthopedic, neurosurgery evaluated the patient and recommended TLC L for pain control and outpatient follow-up.  Physical therapy recommended inpatient rehab difficult to place due to lack of insurance. IR was consulted to perform kyphoplasty of T12 compression fracture on 06/18/2022 Patient is medically stable for transfer awaiting placement to skilled nursing facility   Assessment/Plan:   Severe back pain/lumbar spinal stenosis and T12 compression fraction: MRI did not show any cord injury neurosurgery was consulted recommended no surgical intervention the recommended TLCO. IR was consulted to perform kyphoplasty on 06/18/2022. Physical therapy evaluated the patient recommended skilled nursing facility is a challenge because he does not have insurance.  Leukocytosis/fever: Of unclear source now resolved. He was initially started on IV vancomycin and Rocephin Work-up was unrevealing, there is likely reactive due to T12 compression fracture MRI was done of the shoulder there was question septic arthritis ,IR aspirated again on 06/06/2022 that showed no signs of infection.  ID was consulted and antibiotics were discontinued.  Chronic right shoulder pain: Continue oral narcotics follow-up with orthopedic surgery as an outpatient. Was complaining of right wrist pain MRI showed inflammatory arthropathy no discrete drainable abscess. Has remained afebrile he relates his pain is controlled. CBC with differential showed no leukocytosis no left shift.  Abdominal  discomfort/microcytic anemia/iron deficiency anemia: CT scan of the abdomen pelvis did not show any acute findings. Hemoglobin has remained relatively stable.  Acute urinary retention/acute kidney injury: Felt to be secondary to BPH started on Flomax renal function stabilized after Foley was placed, has failed voiding trial 3 times. We will started on Prozac will need to follow-up with urology as an outpatient.  Constipation: Restart him on MiraLAX p.o. twice daily.  Microcytic anemia: Platelet count 437, check an anemia panel, he is already on ferrous sulfate  Vitamin D deficiency: Continue supplementation.  Hyponatremia: Likely chronic.  Hypokalemia/hypomagnesemia: Replete orally now resolved continue to monitor intermittently.  Sinus tachycardia: Resolved with metoprolol.  Diabetes mellitus type 2: Currently on sliding scale insulin has not required insulin for over 5 days. Stop checking CBGs.  Essential hypertension: Well-controlled on metoprolol.  Hyperlipidemia: Continue statins.    DVT prophylaxis: lovenox Family Communication:none Status is: Inpatient Remains inpatient appropriate because: Awaiting skilled nursing select placement.    Code Status:     Code Status Orders  (From admission, onward)           Start     Ordered   05/26/22 2254  Full code  Continuous        05/26/22 2254           Code Status History     This patient has a current code status but no historical code status.         IV Access:   Peripheral IV   Procedures and diagnostic studies:   No results found.   Medical Consultants:   None.   Subjective:    Wayne Stokes relates he can not urinate and it bothers him.  Objective:    Vitals:   07/11/22 0728 07/11/22 1500 07/11/22 1937 07/12/22 0540  BP: 139/82 (!) 86/63 99/82 95/80   Pulse: 87 78 75 85  Resp: 16 18 16 17   Temp: 98.6 F (37 C) 97.8 F (36.6 C) 98.7 F (37.1 C) 97.9 F (36.6 C)   TempSrc: Oral Oral    SpO2: 96% 94% 97% 98%  Weight:      Height:       SpO2: 98 % O2 Flow Rate (L/min): 2 L/min   Intake/Output Summary (Last 24 hours) at 07/12/2022 0824 Last data filed at 07/12/2022 0636 Gross per 24 hour  Intake 480 ml  Output 1100 ml  Net -620 ml    Filed Weights   06/24/22 1407 06/25/22 0500 07/02/22 0500  Weight: 57.4 kg 58.3 kg 53.7 kg    Exam: General exam: In no acute distress. Respiratory system: Good air movement and clear to auscultation. Cardiovascular system: S1 & S2 heard, RRR. No JVD. Gastrointestinal system: Abdomen is nondistended, soft and nontender.  Extremities: No pedal edema. Skin: No rashes, lesions or ulcers   Data Reviewed:    Labs: Basic Metabolic Panel: Recent Labs  Lab 07/07/22 1207 07/11/22 0939  NA 133* 132*  K 3.0* 3.8  CL 96* 95*  CO2 25 25  GLUCOSE 76 86  BUN 9 9  CREATININE 1.10 1.14  CALCIUM 9.8 10.6*    GFR Estimated Creatinine Clearance: 50.6 mL/min (by C-G formula based on SCr of 1.14 mg/dL). Liver Function Tests: Recent Labs  Lab 07/11/22 0953  AST 33  ALT 7  ALKPHOS 89  BILITOT 0.9  PROT 7.7  ALBUMIN 1.5*   No results for input(s): "LIPASE", "AMYLASE" in the last 168 hours. No results for input(s): "AMMONIA" in the last 168 hours. Coagulation profile No results for input(s): "INR", "PROTIME" in the last 168 hours. COVID-19 Labs  Recent Labs    07/11/22 0939  FERRITIN 257     Lab Results  Component Value Date   SARSCOV2NAA NEGATIVE 06/01/2022   [place] NEGATIVE 05/07/2022    CBC: Recent Labs  Lab 07/07/22 1207 07/10/22 0951  WBC 7.1 7.4  NEUTROABS 4.1 6.5  HGB 8.5* 9.3*  HCT 26.9* 28.6*  MCV 71.0* 69.8*  PLT 437* 426*    Cardiac Enzymes: No results for input(s): "CKTOTAL", "CKMB", "CKMBINDEX", "TROPONINI" in the last 168 hours. BNP (last 3 results) No results for input(s): "PROBNP" in the last 8760 hours. CBG: No results for input(s): "GLUCAP" in the last  168 hours.  D-Dimer: No results for input(s): "DDIMER" in the last 72 hours. Hgb A1c: No results for input(s): "HGBA1C" in the last 72 hours.  Lipid Profile: No results for input(s): "CHOL", "HDL", "LDLCALC", "TRIG", "CHOLHDL", "LDLDIRECT" in the last 72 hours. Thyroid function studies: No results for input(s): "TSH", "T4TOTAL", "T3FREE", "THYROIDAB" in the last 72 hours.  Invalid input(s): "FREET3" Anemia work up: Recent Labs    07/11/22 0939  VITAMINB12 1,751*  FOLATE 10.7  FERRITIN 257  TIBC 118*  IRON 49  RETICCTPCT 1.3   Sepsis Labs: Recent Labs  Lab 07/07/22 1207 07/10/22 0951  WBC 7.1 7.4    Microbiology No results found for this or any previous visit (from the past 240 hour(s)).   Medications:    atorvastatin  40 mg Oral Daily   Chlorhexidine Gluconate Cloth  6 each Topical Daily   cholecalciferol  1,000 Units Oral Daily   colchicine  0.3 mg Oral BID   donepezil  5 mg Oral QHS   feeding supplement  237 mL Oral BID BM   ferrous  sulfate  325 mg Oral Q breakfast   fluconazole  100 mg Oral Daily   gabapentin  200 mg Oral TID   lidocaine  1 patch Transdermal Q24H   lidocaine  1 patch Transdermal Q24H   magnesium gluconate  500 mg Oral BID   methocarbamol  500 mg Oral TID   metoprolol succinate  25 mg Oral Daily   morphine  15 mg Oral Q12H   pantoprazole  40 mg Oral Daily   polyethylene glycol  17 g Oral BID   senna-docusate  1 tablet Oral BID   tamsulosin  0.4 mg Oral QPC supper   Continuous Infusions:     LOS: 47 days   Marinda Elk  [facility]  07/12/2022, 8:24 AM

## 2022-07-12 NOTE — Progress Notes (Signed)
Mobility Specialist Progress Note   07/12/22 1000  Mobility  Activity Dangled on edge of bed (Bed level exerices)  Level of Assistance Minimal assist, patient does 75% or more  Assistive Device None  Range of Motion/Exercises Active;All extremities  Activity Response Tolerated fair   Patient received in supine agreeable to participate in mobility. Required min A to EOB from supine to sit. Upon dangling EOB pt c/o severe shoulder pain and returned to supine. Agreed to participate in bed level exerices, completed x2 LE exercises but deferred to continue stating that he was tired. Tolerated without incident but pain and fatigue still limited participation. Was left in supine with all needs met, call bell in reach.   Wayne Stokes, [place], [place]  Office: [phone]

## 2022-07-13 MED ORDER — POLYETHYLENE GLYCOL 3350 17 G PO PACK
17.0000 g | PACK | Freq: Two times a day (BID) | ORAL | Status: DC
  Administered 2022-07-14 – 2022-07-18 (×3): 17 g via ORAL
  Filled 2022-07-13 (×10): qty 1

## 2022-07-13 NOTE — Progress Notes (Signed)
[facility] PROGRESS NOTE    Progress Note  Wayne Stokes  IBB:[id number] DOB: 05-May-1966 DOA: 05/26/2022 PCP: [name], NP     Brief Narrative:   Wayne Stokes is an 56 y.o. male from [place], diabetes mellitus type 2, essential hypertension with lumbar disc disease with spinal stenosis, T12 compression fracture and mild cognitive impairment admitted for uncontrolled back pain and severe spinal lumbar stenosis orthopedic, neurosurgery evaluated the patient and recommended TLC L for pain control and outpatient follow-up.  Physical therapy recommended inpatient rehab difficult to place due to lack of insurance. IR was consulted to perform kyphoplasty of T12 compression fracture on 06/18/2022 Patient is medically stable for transfer awaiting placement to skilled nursing facility   Assessment/Plan:   Severe back pain/lumbar spinal stenosis and T12 compression fraction: MRI did not show any cord injury neurosurgery was consulted recommended no surgical intervention the recommended TLCO. IR was consulted to perform kyphoplasty on 06/18/2022. Physical therapy evaluated the patient recommended skilled nursing facility is a challenge because he does not have insurance.  Leukocytosis/fever: Of unclear source now resolved. He was initially started on IV vancomycin and Rocephin Work-up was unrevealing, there is likely reactive due to T12 compression fracture MRI was done of the shoulder there was question septic arthritis ,IR aspirated again on 06/06/2022 that showed no signs of infection.  ID was consulted and antibiotics were discontinued.  Chronic right shoulder pain: Continue oral narcotics follow-up with orthopedic surgery as an outpatient. Was complaining of right wrist pain MRI showed inflammatory arthropathy no discrete drainable abscess. Has remained afebrile he relates his pain is controlled. CBC with differential showed no leukocytosis no left shift.  Abdominal  discomfort/microcytic anemia/iron deficiency anemia: CT scan of the abdomen pelvis did not show any acute findings. Hemoglobin has remained relatively stable.  Acute urinary retention/acute kidney injury: Felt to be secondary to BPH started on Flomax renal function stabilized after Foley was placed, has failed voiding trial 3 times. We will started on Prozac will need to follow-up with urology as an outpatient.  Constipation: Restart him on MiraLAX p.o. twice daily.  Microcytic anemia: Platelet count 437, check an anemia panel, he is already on ferrous sulfate  Vitamin D deficiency: Continue supplementation.  Hyponatremia: Likely chronic.  Hypokalemia/hypomagnesemia: Replete orally now resolved continue to monitor intermittently.  Sinus tachycardia: Resolved with metoprolol.  Diabetes mellitus type 2: Currently on sliding scale insulin has not required insulin for over 5 days. Stop checking CBGs.  Essential hypertension: Well-controlled on metoprolol.  Hyperlipidemia: Continue statins.    DVT prophylaxis: lovenox Family Communication:none Status is: Inpatient Remains inpatient appropriate because: Awaiting skilled nursing select placement.    Code Status:     Code Status Orders  (From admission, onward)           Start     Ordered   05/26/22 2254  Full code  Continuous        05/26/22 2254           Code Status History     This patient has a current code status but no historical code status.         IV Access:   Peripheral IV   Procedures and diagnostic studies:   No results found.   Medical Consultants:   None.   Subjective:    Wayne Stokes relates he can not urinate and it bothers him.  Objective:    Vitals:   07/12/22 1542 07/12/22 1747 07/12/22 1942 07/13/22 0446  BP: (!) 89/63 101/78 105/72 107/80  Pulse: 88 87 86 94  Resp: 16 16 16 16   Temp: 98.6 F (37 C) 98.4 F (36.9 C) 98.7 F (37.1 C) 98.5 F (36.9 C)   TempSrc: Oral Oral  Oral  SpO2: 96% 93% 91% 91%  Weight:      Height:       SpO2: 91 % O2 Flow Rate (L/min): 2 L/min   Intake/Output Summary (Last 24 hours) at 07/13/2022 0743 Last data filed at 07/13/2022 0446 Gross per 24 hour  Intake 654.06 ml  Output 1100 ml  Net -445.94 ml    Filed Weights   06/24/22 1407 06/25/22 0500 07/02/22 0500  Weight: 57.4 kg 58.3 kg 53.7 kg    Exam: General exam: In no acute distress. Respiratory system: Good air movement and clear to auscultation. Cardiovascular system: S1 & S2 heard, RRR. No JVD. Gastrointestinal system: Abdomen is nondistended, soft and nontender.  Extremities: No pedal edema. Skin: No rashes, lesions or ulcers   Data Reviewed:    Labs: Basic Metabolic Panel: Recent Labs  Lab 07/07/22 1207 07/11/22 0939  NA 133* 132*  K 3.0* 3.8  CL 96* 95*  CO2 25 25  GLUCOSE 76 86  BUN 9 9  CREATININE 1.10 1.14  CALCIUM 9.8 10.6*    GFR Estimated Creatinine Clearance: 50.6 mL/min (by C-G formula based on SCr of 1.14 mg/dL). Liver Function Tests: Recent Labs  Lab 07/11/22 0953  AST 33  ALT 7  ALKPHOS 89  BILITOT 0.9  PROT 7.7  ALBUMIN 1.5*    No results for input(s): "LIPASE", "AMYLASE" in the last 168 hours. No results for input(s): "AMMONIA" in the last 168 hours. Coagulation profile No results for input(s): "INR", "PROTIME" in the last 168 hours. COVID-19 Labs  Recent Labs    07/11/22 0939  FERRITIN 257     Lab Results  Component Value Date   SARSCOV2NAA NEGATIVE 06/01/2022   SARSCOV2NAA NEGATIVE 05/07/2022    CBC: Recent Labs  Lab 07/07/22 1207 07/10/22 0951  WBC 7.1 7.4  NEUTROABS 4.1 6.5  HGB 8.5* 9.3*  HCT 26.9* 28.6*  MCV 71.0* 69.8*  PLT 437* 426*    Cardiac Enzymes: No results for input(s): "CKTOTAL", "CKMB", "CKMBINDEX", "TROPONINI" in the last 168 hours. BNP (last 3 results) No results for input(s): "PROBNP" in the last 8760 hours. CBG: No results for input(s): "GLUCAP"  in the last 168 hours.  D-Dimer: No results for input(s): "DDIMER" in the last 72 hours. Hgb A1c: No results for input(s): "HGBA1C" in the last 72 hours.  Lipid Profile: No results for input(s): "CHOL", "HDL", "LDLCALC", "TRIG", "CHOLHDL", "LDLDIRECT" in the last 72 hours. Thyroid function studies: No results for input(s): "TSH", "T4TOTAL", "T3FREE", "THYROIDAB" in the last 72 hours.  Invalid input(s): "FREET3" Anemia work up: Recent Labs    07/11/22 0939  VITAMINB12 1,751*  FOLATE 10.7  FERRITIN 257  TIBC 118*  IRON 49  RETICCTPCT 1.3    Sepsis Labs: Recent Labs  Lab 07/07/22 1207 07/10/22 0951  WBC 7.1 7.4    Microbiology No results found for this or any previous visit (from the past 240 hour(s)).   Medications:    atorvastatin  40 mg Oral Daily   Chlorhexidine Gluconate Cloth  6 each Topical Daily   cholecalciferol  1,000 Units Oral Daily   colchicine  0.3 mg Oral BID   donepezil  5 mg Oral QHS   feeding supplement  237 mL Oral BID BM  ferrous sulfate  325 mg Oral Q breakfast   finasteride  5 mg Oral Daily   fluconazole  100 mg Oral Daily   gabapentin  200 mg Oral TID   lidocaine  1 patch Transdermal Q24H   lidocaine  1 patch Transdermal Q24H   magnesium gluconate  500 mg Oral BID   methocarbamol  500 mg Oral TID   metoprolol succinate  25 mg Oral Daily   morphine  15 mg Oral Q12H   pantoprazole  40 mg Oral Daily   polyethylene glycol  17 g Oral BID   senna-docusate  1 tablet Oral BID   tamsulosin  0.4 mg Oral QPC supper   Continuous Infusions:     LOS: 48 days   Charlynne Cousins  [facility]  07/13/2022, 7:43 AM

## 2022-07-14 NOTE — Progress Notes (Signed)
Pt refused 1800 Flomax. Will continue to monitor.

## 2022-07-14 NOTE — Progress Notes (Signed)
Physical Therapy Treatment Patient Details Name: Wayne Stokes MRN: [id number] DOB: Jul 05, 1966 Today's Date: 07/14/2022   History of Present Illness 56 y.o. male presented 05/26/22 with c/o severe and progressively worsening low back pain that radiates into his left buttock and down his LLE. Imaging +acute T12 compression fracture and  L4/5 there is multifactorial severe spinal stenosis and severe bilateral foraminal stenosis. Neurosurgery consult with no acute surgical intervention needed. IR consulted for possible kyphoplasty bu twas delayed due to fever; S/p T12 KP with biopsy on 10/11. 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    PT Comments    Pt received in supine, c/o fatigue and lethargic, able to be awoken with increased time, translator Keo present to assist with interpretation. Pt with fair participation and tolerance for bed mobility and transfer training. Pt needing increased assist up to mod/maxA for sit<>stand and sidesteps toward [facility] with RW. Once seated, pt impulsive to return to supine. Pt defers ambulation due to fatigue/back pain. Pt continues to benefit from PT services to progress toward functional mobility goals.   Recommendations for follow up therapy are one component of a multi-disciplinary discharge planning process, led by the attending physician.  Recommendations may be updated based on patient status, additional functional criteria and insurance authorization.  Follow Up Recommendations  Skilled nursing-short term rehab (<3 hours/day) Can patient physically be transported by private vehicle: Yes   Assistance Recommended at Discharge Frequent or constant Supervision/Assistance  Patient can return home with the following Assistance with cooking/housework;Direct supervision/assist for medications management;Direct supervision/assist for financial management;Assist for  transportation;Help with stairs or ramp for entrance;A lot of help with walking and/or transfers   Equipment Recommendations  BSC/3in1;Rollator (4 wheels)    Recommendations for Other Services       Precautions / Restrictions Precautions Precautions: Fall;Back Precaution Booklet Issued:  (verbal review) Precaution Comments: Back precautions for comfort Required Braces or Orthoses: Spinal Brace Spinal Brace: Thoracolumbosacral orthotic Restrictions Weight Bearing Restrictions: No     Mobility  Bed Mobility Overal bed mobility: Needs Assistance Bed Mobility: Supine to Sit, Sit to Supine Rolling: Min assist Sidelying to sit: Mod assist     Sit to sidelying: Min assist General bed mobility comments: multimodal cues for sequencing, assist to intiate rolling and to raise trunk; modA from flat bed and minA to return to sidelying from sitting for trunk and BLE assist    Transfers Overall transfer level: Needs assistance Equipment used: Rolling walker (2 wheels) Transfers: Sit to/from Stand Sit to Stand: Mod assist, Max assist   Step pivot transfers: Mod assist       General transfer comment: increased assist due to pt increased pain and fatigue, pt needing mod to maxA for sit>stand and for lateral steps toward [facility] to simulate pivot to chair/BSC, pt defers OOB to chair due to fatigue and frequently closing his eyes.    Ambulation/Gait               General Gait Details: pt refusing, reports too fatigued.    Balance Overall balance assessment: Needs assistance   Sitting balance-Leahy Scale: Fair Sitting balance - Comments: total A to don brace, min guard for safety while seated   Standing balance support: During functional activity, Bilateral upper extremity supported, Reliant on assistive device for balance Standing balance-Leahy Scale: Poor Standing balance comment: pt with increased postural sway and posterior bias, likely due to fatigue.  Cognition Arousal/Alertness: Lethargic Behavior During Therapy: Impulsive, Flat affect Overall Cognitive Status: Impaired/Different from baseline Area of Impairment: Safety/judgement, Following commands, Problem solving, Memory                 Orientation Level: Disoriented to, Time, Situation   Memory: Decreased recall of precautions, Decreased short-term memory Following Commands: Follows multi-step commands inconsistently, Follows one step commands inconsistently Safety/Judgement: Decreased awareness of safety, Decreased awareness of deficits Awareness: Intellectual Problem Solving: Slow processing, Requires verbal cues, Decreased initiation, Difficulty sequencing General Comments: Ability to follow commands dependent on motivation to participate, pt more lethargic in afternoon PT session and with poor command following unless given max multimodal cues to participate.        Exercises Other Exercises Other Exercises: pt defers despite encouragement Other Exercises: Pt moving LE to help with cramps hip/knee flexion/ext ~5-10 reps ea Other Exercises: STS x 3 reps for BLE strengthening    General Comments General comments (skin integrity, edema, etc.): VSS per chart review, no acute s/sx distress      Pertinent Vitals/Pain Pain Assessment Pain Assessment: Faces Faces Pain Scale: Hurts even more Pain Location: back Pain Descriptors / Indicators: Moaning, Grimacing, Sore Pain Intervention(s): Monitored during session, Repositioned, Limited activity within patient's tolerance           PT Goals (current goals can now be found in the care plan section) Acute Rehab PT Goals Patient Stated Goal: decreased pain PT Goal Formulation: With patient Time For Goal Achievement: 07/22/22 Progress towards PT goals: Progressing toward goals    Frequency    Min 3X/week      PT Plan Current plan remains appropriate       AM-PAC PT "6 Clicks" Mobility    Outcome Measure  Help needed turning from your back to your side while in a flat bed without using bedrails?: A Little Help needed moving from lying on your back to sitting on the side of a flat bed without using bedrails?: A Lot Help needed moving to and from a bed to a chair (including a wheelchair)?: A Lot Help needed standing up from a chair using your arms (e.g., wheelchair or bedside chair)?: A Lot Help needed to walk in hospital room?: Total (today) Help needed climbing 3-5 steps with a railing? : Total 6 Click Score: 11    End of Session Equipment Utilized During Treatment: Back brace;Gait belt Activity Tolerance: Patient limited by lethargy;Patient limited by pain Patient left: in bed;with call bell/phone within reach;with bed alarm set;Other (comment) (bed in chair posture) Nurse Communication: Mobility status;Precautions PT Visit Diagnosis: Other abnormalities of gait and mobility (R26.89);Muscle weakness (generalized) (M62.81);Pain Pain - Right/Left: Right Pain - part of body: Shoulder (back pain)     Time: 6213-0865 PT Time Calculation (min) (ACUTE ONLY): 17 min  Charges:  $Therapeutic Exercise: 8-22 mins $Therapeutic Activity: 8-22 mins                     Curtiss Mahmood P., PTA Acute Rehabilitation Services Secure Chat Preferred 9a-5:30pm Office: [phone]    Dorathy Kinsman [facility] 07/14/2022, 3:07 PM

## 2022-07-14 NOTE — Progress Notes (Signed)
[facility] PROGRESS NOTE    Progress Note  Wayne Stokes  [id number]:[id number] DOB: 20-Dec-1965 DOA: 05/26/2022 PCP: [name], NP     Brief Narrative:   Wayne Stokes is an 56 y.o. male from [place], diabetes mellitus type 2, essential hypertension with lumbar disc disease with spinal stenosis, T12 compression fracture and mild cognitive impairment admitted for uncontrolled back pain and severe spinal lumbar stenosis orthopedic, neurosurgery evaluated the patient and recommended TLC L for pain control and outpatient follow-up.  Physical therapy recommended inpatient rehab difficult to place due to lack of insurance. IR was consulted to perform kyphoplasty of T12 compression fracture on 06/18/2022 Patient is medically stable for transfer awaiting placement to skilled nursing facility.   Assessment/Plan:   Severe back pain/lumbar spinal stenosis and T12 compression fraction: MRI did not show any cord injury neurosurgery was consulted recommended no surgical intervention the recommended TLCO. IR was consulted to perform kyphoplasty on 06/18/2022. Physical therapy evaluated the patient recommended skilled nursing facility is a challenge because he does not have insurance.  Leukocytosis/fever: Of unclear source now resolved. He was initially started on IV vancomycin and Rocephin Work-up was unrevealing, there is likely reactive due to T12 compression fracture MRI was done of the shoulder there was question septic arthritis ,IR aspirated again on 06/06/2022 that showed no signs of infection.  ID was consulted and antibiotics were discontinued.  Chronic right shoulder pain: Continue oral narcotics follow-up with orthopedic surgery as an outpatient. Was complaining of right wrist pain MRI showed inflammatory arthropathy no discrete drainable abscess. Has remained afebrile he relates his pain is controlled. CBC with differential showed no leukocytosis no left  shift.  Abdominal discomfort/microcytic anemia/iron deficiency anemia: CT scan of the abdomen pelvis did not show any acute findings. Hemoglobin has remained relatively stable.  Acute urinary retention/acute kidney injury: Felt to be secondary to BPH started on Flomax renal function stabilized after Foley was placed, has failed voiding trial 3 times. We will started on Prozac will need to follow-up with urology as an outpatient.  Constipation: Restart him on MiraLAX p.o. twice daily.  Microcytic anemia: Platelet count 437, check an anemia panel, he is already on ferrous sulfate  Vitamin D deficiency: Continue supplementation.  Hyponatremia: Likely chronic.  Hypokalemia/hypomagnesemia: Replete orally now resolved continue to monitor intermittently.  Sinus tachycardia: Resolved with metoprolol.  Diabetes mellitus type 2: Currently on sliding scale insulin has not required insulin for over 5 days. Stop checking CBGs.  Essential hypertension: Well-controlled on metoprolol.  Hyperlipidemia: Continue statins.    DVT prophylaxis: lovenox Family Communication:none Status is: Inpatient Remains inpatient appropriate because: Awaiting skilled nursing select placement.    Code Status:     Code Status Orders  (From admission, onward)           Start     Ordered   05/26/22 2254  Full code  Continuous        05/26/22 2254           Code Status History     This patient has a current code status but no historical code status.         IV Access:   Peripheral IV   Procedures and diagnostic studies:   No results found.   Medical Consultants:   None.   Subjective:    Wayne Stokes relates he can not urinate and it bothers him.  Objective:    Vitals:   07/13/22 6195 07/13/22 1743 07/13/22 2111 07/14/22 0932  BP: 99/76 116/80 94/72 108/79  Pulse: 98  76 80  Resp: 18 18 14    Temp: 97.8 F (36.6 C) 97.8 F (36.6 C) 98.8 F (37.1 C) 98 F (36.7  C)  TempSrc: Oral Oral Oral Oral  SpO2: 95% 92% 96% 90%  Weight:      Height:       SpO2: 90 % O2 Flow Rate (L/min): 2 L/min   Intake/Output Summary (Last 24 hours) at 07/14/2022 0827 Last data filed at 07/13/2022 1700 Gross per 24 hour  Intake 480 ml  Output 450 ml  Net 30 ml    Filed Weights   06/24/22 1407 06/25/22 0500 07/02/22 0500  Weight: 57.4 kg 58.3 kg 53.7 kg    Exam: General exam: In no acute distress. Respiratory system: Good air movement and clear to auscultation. Cardiovascular system: S1 & S2 heard, RRR. No JVD. Gastrointestinal system: Abdomen is nondistended, soft and nontender.  Extremities: No pedal edema. Skin: No rashes, lesions or ulcers   Data Reviewed:    Labs: Basic Metabolic Panel: Recent Labs  Lab 07/07/22 1207 07/11/22 0939  NA 133* 132*  K 3.0* 3.8  CL 96* 95*  CO2 25 25  GLUCOSE 76 86  BUN 9 9  CREATININE 1.10 1.14  CALCIUM 9.8 10.6*    GFR Estimated Creatinine Clearance: 50.6 mL/min (by C-G formula based on SCr of 1.14 mg/dL). Liver Function Tests: Recent Labs  Lab 07/11/22 0953  AST 33  ALT 7  ALKPHOS 89  BILITOT 0.9  PROT 7.7  ALBUMIN 1.5*    No results for input(s): "LIPASE", "AMYLASE" in the last 168 hours. No results for input(s): "AMMONIA" in the last 168 hours. Coagulation profile No results for input(s): "INR", "PROTIME" in the last 168 hours. COVID-19 Labs  Recent Labs    07/11/22 0939  FERRITIN 257     Lab Results  Component Value Date   SARSCOV2NAA NEGATIVE 06/01/2022   [place] NEGATIVE 05/07/2022    CBC: Recent Labs  Lab 07/07/22 1207 07/10/22 0951  WBC 7.1 7.4  NEUTROABS 4.1 6.5  HGB 8.5* 9.3*  HCT 26.9* 28.6*  MCV 71.0* 69.8*  PLT 437* 426*    Cardiac Enzymes: No results for input(s): "CKTOTAL", "CKMB", "CKMBINDEX", "TROPONINI" in the last 168 hours. BNP (last 3 results) No results for input(s): "PROBNP" in the last 8760 hours. CBG: No results for input(s): "GLUCAP" in  the last 168 hours.  D-Dimer: No results for input(s): "DDIMER" in the last 72 hours. Hgb A1c: No results for input(s): "HGBA1C" in the last 72 hours.  Lipid Profile: No results for input(s): "CHOL", "HDL", "LDLCALC", "TRIG", "CHOLHDL", "LDLDIRECT" in the last 72 hours. Thyroid function studies: No results for input(s): "TSH", "T4TOTAL", "T3FREE", "THYROIDAB" in the last 72 hours.  Invalid input(s): "FREET3" Anemia work up: Recent Labs    07/11/22 0939  VITAMINB12 1,751*  FOLATE 10.7  FERRITIN 257  TIBC 118*  IRON 49  RETICCTPCT 1.3    Sepsis Labs: Recent Labs  Lab 07/07/22 1207 07/10/22 0951  WBC 7.1 7.4    Microbiology No results found for this or any previous visit (from the past 240 hour(s)).   Medications:    atorvastatin  40 mg Oral Daily   Chlorhexidine Gluconate Cloth  6 each Topical Daily   cholecalciferol  1,000 Units Oral Daily   colchicine  0.3 mg Oral BID   donepezil  5 mg Oral QHS   feeding supplement  237 mL Oral BID BM  ferrous sulfate  325 mg Oral Q breakfast   finasteride  5 mg Oral Daily   fluconazole  100 mg Oral Daily   gabapentin  200 mg Oral TID   lidocaine  1 patch Transdermal Q24H   lidocaine  1 patch Transdermal Q24H   magnesium gluconate  500 mg Oral BID   methocarbamol  500 mg Oral TID   metoprolol succinate  25 mg Oral Daily   morphine  15 mg Oral Q12H   pantoprazole  40 mg Oral Daily   polyethylene glycol  17 g Oral BID   senna-docusate  1 tablet Oral BID   tamsulosin  0.4 mg Oral QPC supper   Continuous Infusions:     LOS: 49 days   Marinda Elk  [facility]  07/14/2022, 8:27 AM

## 2022-07-14 NOTE — Progress Notes (Signed)
Pt given morning medications and a few minutes later pt vomited. Writer as Pt if he would like Zofran PO, pt refused. Pt also refused scheduled Miralax and Ensure drink. VS WNL.Pt complained of being cold, warm blanket given. Pt is eating tangerines now able to tolerant.

## 2022-07-14 NOTE — Progress Notes (Signed)
Occupational Therapy Treatment Patient Details Name: Wayne Stokes MRN: [id number] DOB: 03-14-1966 Today's Date: 07/14/2022   History of present illness 56 y.o. male presented 05/26/22 with c/o severe and progressively worsening low back pain that radiates into his left buttock and down his LLE. Imaging +acute T12 compression fracture and  L4/5 there is multifactorial severe spinal stenosis and severe bilateral foraminal stenosis. Neurosurgery consult with no acute surgical intervention needed. IR consulted for possible kyphoplasty bu twas delayed due to fever; S/p T12 KP with biopsy on 10/11. 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   OT comments  Pt moaning and complaining of being cold throughout session. Needing min assist to initiate bed mobility, for sit to stand and ambulation to chair, bathroom and back to bed with RW. Total assist for back brace, pulled up socks once over toes, self fed orange in chair and washed hands in sink with min guard assist. Goals updated.    Recommendations for follow up therapy are one component of a multi-disciplinary discharge planning process, led by the attending physician.  Recommendations may be updated based on patient status, additional functional criteria and insurance authorization.    Follow Up Recommendations  Skilled nursing-short term rehab (<3 hours/day)    Assistance Recommended at Discharge Frequent or constant Supervision/Assistance  Patient can return home with the following  A little help with walking and/or transfers;Assistance with cooking/housework;Assist for transportation;Help with stairs or ramp for entrance;A lot of help with bathing/dressing/bathroom;Direct supervision/assist for medications management   Equipment Recommendations  BSC/3in1    Recommendations for Other Services      Precautions / Restrictions  Precautions Precautions: Fall;Back Precaution Comments: Back precautions for comfort Required Braces or Orthoses: Spinal Brace Spinal Brace: Thoracolumbosacral orthotic Restrictions Weight Bearing Restrictions: No       Mobility Bed Mobility Overal bed mobility: Needs Assistance Bed Mobility: Supine to Sit, Sit to Supine Rolling: Min assist Sidelying to sit: Min assist, HOB elevated     Sit to sidelying: Min guard, HOB elevated General bed mobility comments: multimodal cues for sequencing, assist to intiate rolling and to raise trunk    Transfers Overall transfer level: Needs assistance Equipment used: Rolling walker (2 wheels) Transfers: Sit to/from Stand Sit to Stand: Min assist           General transfer comment: assist to rise     Balance Overall balance assessment: Needs assistance   Sitting balance-Leahy Scale: Good     Standing balance support: During functional activity, Bilateral upper extremity supported, Reliant on assistive device for balance Standing balance-Leahy Scale: Poor Standing balance comment: can release walker in static standing to wash hands at sink                           ADL either performed or assessed with clinical judgement   ADL Overall ADL's : Needs assistance/impaired Eating/Feeding: Set up;Sitting Eating/Feeding Details (indicate cue type and reason): ate orange seated in chair Grooming: Wash/dry hands;Standing;Min guard               Lower Body Dressing: Total assistance;Sitting/lateral leans Lower Body Dressing Details (indicate cue type and reason): pt handing socks to OT to don, pulled up socks once over toes             Functional mobility during ADLs: Minimal assistance;Rolling walker (2 wheels) General ADL Comments: ambulated to chair, bathroom  and back to bed with RW    Extremity/Trunk Assessment              Vision       Perception     Praxis      Cognition Arousal/Alertness:  Awake/alert Behavior During Therapy: Flat affect Overall Cognitive Status: Impaired/Different from baseline Area of Impairment: Safety/judgement, Following commands, Problem solving                       Following Commands: Follows one step commands consistently, Follows multi-step commands inconsistently Safety/Judgement: Decreased awareness of safety, Decreased awareness of deficits   Problem Solving: Slow processing, Requires verbal cues General Comments: ability to follow commands dependent on motivation to participate        Exercises      Shoulder Instructions       General Comments      Pertinent Vitals/ Pain       Pain Assessment Pain Assessment: Faces Faces Pain Scale: Hurts even more Pain Location: did not specify Pain Descriptors / Indicators: Moaning, Grimacing Pain Intervention(s): Monitored during session, Repositioned  Home Living                                          Prior Functioning/Environment              Frequency  Min 2X/week        Progress Toward Goals  OT Goals(current goals can now be found in the care plan section)  Progress towards OT goals: Progressing toward goals  Acute Rehab OT Goals OT Goal Formulation: With patient Time For Goal Achievement: 07/24/22 Potential to Achieve Goals: [place] Discharge plan remains appropriate    Co-evaluation                 AM-PAC OT "6 Clicks" Daily Activity     Outcome Measure   Help from another person eating meals?: None Help from another person taking care of personal grooming?: A Little Help from another person toileting, which includes using toliet, bedpan, or urinal?: A Little Help from another person bathing (including washing, rinsing, drying)?: A Little Help from another person to put on and taking off regular upper body clothing?: A Little Help from another person to put on and taking off regular lower body clothing?: A Little 6 Click  Score: 19    End of Session Equipment Utilized During Treatment: Gait belt;Rolling walker (2 wheels)  OT Visit Diagnosis: Other abnormalities of gait and mobility (R26.89);Muscle weakness (generalized) (M62.81);Pain   Activity Tolerance Patient limited by fatigue;Other (comment) (appears self limiting)   Patient Left in bed;with call bell/phone within reach;with bed alarm set   Nurse Communication          Time: 1042-1100 OT Time Calculation (min): 18 min  Charges: OT General Charges $OT Visit: 1 Visit OT Treatments $Self Care/Home Management : 8-22 mins  Cleta Alberts, OTR/L Acute Rehabilitation Services Office: [phone]   Malka So 07/14/2022, 11:05 AM

## 2022-07-14 NOTE — Progress Notes (Signed)
Mobility Specialist Progress Note   07/14/22 1235  Mobility  Activity Dangled on edge of bed  Level of Assistance Moderate assist, patient does 50-74%  Assistive Device Front wheel walker;Other (Comment) (HHA)  Activity Response Tolerated fair  $Mobility charge 1 Mobility   Received pt in bed requiring max encouragement to get up today. Pt moaning throughout transfer spine>sit but not specifying area of discomfort. Able to sit EOB for ~7 mins but requiring modA to keep upright. Pt producing emesis after sitting up and was assisted back to bed w/ minA. RN notified    Holland Falling Mobility Specialist Acute Rehab Office:  [phone]

## 2022-07-15 NOTE — Progress Notes (Signed)
[facility] PROGRESS NOTE    Progress Note  Wayne Stokes  [id number]:[id number] DOB: 1966-05-08 DOA: 05/26/2022 PCP: [name], NP     Brief Narrative:   Wayne Stokes is an 56 y.o. male from [place], diabetes mellitus type 2, essential hypertension with lumbar disc disease with spinal stenosis, T12 compression fracture and mild cognitive impairment admitted for uncontrolled back pain and severe spinal lumbar stenosis orthopedic, neurosurgery evaluated the patient and recommended TLC L for pain control and outpatient follow-up.  Physical therapy recommended inpatient rehab difficult to place due to lack of insurance. IR was consulted to perform kyphoplasty of T12 compression fracture on 06/18/2022 Patient is medically stable for transfer awaiting placement to skilled nursing facility.   Assessment/Plan:   Severe back pain/lumbar spinal stenosis and T12 compression fraction: MRI did not show any cord injury neurosurgery was consulted recommended no surgical intervention the recommended TLCO. IR was consulted to perform kyphoplasty on 06/18/2022. Physical therapy evaluated the patient recommended skilled nursing facility is a challenge because he does not have insurance.  Leukocytosis/fever: Of unclear source now resolved. He was initially started on IV vancomycin and Rocephin Work-up was unrevealing, there is likely reactive due to T12 compression fracture MRI was done of the shoulder there was question septic arthritis ,IR aspirated again on 06/06/2022 that showed no signs of infection.  ID was consulted and antibiotics were discontinued.  Chronic right shoulder pain: Continue oral narcotics follow-up with orthopedic surgery as an outpatient. Was complaining of right wrist pain MRI showed inflammatory arthropathy no discrete drainable abscess. Has remained afebrile he relates his pain is controlled. CBC with differential showed no leukocytosis no left  shift.  Abdominal discomfort/microcytic anemia/iron deficiency anemia: CT scan of the abdomen pelvis did not show any acute findings. Hemoglobin has remained relatively stable.  Acute urinary retention/acute kidney injury: Felt to be secondary to BPH started on Flomax renal function stabilized after Foley was placed, has failed voiding trial 3 times. We will started on Prozac will need to follow-up with urology as an outpatient.  Constipation: Restart him on MiraLAX p.o. twice daily.  Microcytic anemia: Platelet count 437, check an anemia panel, he is already on ferrous sulfate  Vitamin D deficiency: Continue supplementation.  Hyponatremia: Likely chronic.  Hypokalemia/hypomagnesemia: Replete orally now resolved continue to monitor intermittently.  Sinus tachycardia: Resolved with metoprolol.  Diabetes mellitus type 2: Currently on sliding scale insulin has not required insulin for over 5 days. Stop checking CBGs.  Essential hypertension: Well-controlled on metoprolol.  Hyperlipidemia: Continue statins.    DVT prophylaxis: lovenox Family Communication:none Status is: Inpatient Remains inpatient appropriate because: Awaiting skilled nursing select placement.    Code Status:     Code Status Orders  (From admission, onward)           Start     Ordered   05/26/22 2254  Full code  Continuous        05/26/22 2254           Code Status History     This patient has a current code status but no historical code status.         IV Access:   Peripheral IV   Procedures and diagnostic studies:   No results found.   Medical Consultants:   None.   Subjective:    [name] relates he can not urinate and it bothers him.  Objective:    Vitals:   07/14/22 1424 07/14/22 2034 07/15/22 0441 07/15/22 3220  BP: 101/74 106/76 105/71 117/82  Pulse: 81 74 77 80  Resp:  16 18   Temp: 97.8 F (36.6 C) 98.6 F (37 C)  98.3 F (36.8 C)  TempSrc:  Oral   Oral  SpO2: 96% 95% 94% 92%  Weight:      Height:       SpO2: 92 % O2 Flow Rate (L/min): 2 L/min   Intake/Output Summary (Last 24 hours) at 07/15/2022 0730 Last data filed at 07/14/2022 1600 Gross per 24 hour  Intake 120 ml  Output 625 ml  Net -505 ml    Filed Weights   06/24/22 1407 06/25/22 0500 07/02/22 0500  Weight: 57.4 kg 58.3 kg 53.7 kg    Exam: General exam: In no acute distress. Respiratory system: Good air movement and clear to auscultation. Cardiovascular system: S1 & S2 heard, RRR. No JVD. Gastrointestinal system: Abdomen is nondistended, soft and nontender.  Extremities: No pedal edema. Skin: No rashes, lesions or ulcers   Data Reviewed:    Labs: Basic Metabolic Panel: Recent Labs  Lab 07/11/22 0939  NA 132*  K 3.8  CL 95*  CO2 25  GLUCOSE 86  BUN 9  CREATININE 1.14  CALCIUM 10.6*    GFR Estimated Creatinine Clearance: 50.6 mL/min (by C-G formula based on SCr of 1.14 mg/dL). Liver Function Tests: Recent Labs  Lab 07/11/22 0953  AST 33  ALT 7  ALKPHOS 89  BILITOT 0.9  PROT 7.7  ALBUMIN 1.5*    No results for input(s): "LIPASE", "AMYLASE" in the last 168 hours. No results for input(s): "AMMONIA" in the last 168 hours. Coagulation profile No results for input(s): "INR", "PROTIME" in the last 168 hours. COVID-19 Labs  No results for input(s): "DDIMER", "FERRITIN", "LDH", "CRP" in the last 72 hours.   Lab Results  Component Value Date   SARSCOV2NAA NEGATIVE 06/01/2022   SARSCOV2NAA NEGATIVE 05/07/2022    CBC: Recent Labs  Lab 07/10/22 0951  WBC 7.4  NEUTROABS 6.5  HGB 9.3*  HCT 28.6*  MCV 69.8*  PLT 426*    Cardiac Enzymes: No results for input(s): "CKTOTAL", "CKMB", "CKMBINDEX", "TROPONINI" in the last 168 hours. BNP (last 3 results) No results for input(s): "PROBNP" in the last 8760 hours. CBG: No results for input(s): "GLUCAP" in the last 168 hours.  D-Dimer: No results for input(s): "DDIMER" in the last  72 hours. Hgb A1c: No results for input(s): "HGBA1C" in the last 72 hours.  Lipid Profile: No results for input(s): "CHOL", "HDL", "LDLCALC", "TRIG", "CHOLHDL", "LDLDIRECT" in the last 72 hours. Thyroid function studies: No results for input(s): "TSH", "T4TOTAL", "T3FREE", "THYROIDAB" in the last 72 hours.  Invalid input(s): "FREET3" Anemia work up: No results for input(s): "VITAMINB12", "FOLATE", "FERRITIN", "TIBC", "IRON", "RETICCTPCT" in the last 72 hours.  Sepsis Labs: Recent Labs  Lab 07/10/22 0951  WBC 7.4    Microbiology No results found for this or any previous visit (from the past 240 hour(s)).   Medications:    atorvastatin  40 mg Oral Daily   Chlorhexidine Gluconate Cloth  6 each Topical Daily   cholecalciferol  1,000 Units Oral Daily   colchicine  0.3 mg Oral BID   donepezil  5 mg Oral QHS   feeding supplement  237 mL Oral BID BM   ferrous sulfate  325 mg Oral Q breakfast   finasteride  5 mg Oral Daily   fluconazole  100 mg Oral Daily   gabapentin  200 mg Oral TID   lidocaine  1 patch Transdermal Q24H   lidocaine  1 patch Transdermal Q24H   magnesium gluconate  500 mg Oral BID   methocarbamol  500 mg Oral TID   metoprolol succinate  25 mg Oral Daily   morphine  15 mg Oral Q12H   pantoprazole  40 mg Oral Daily   polyethylene glycol  17 g Oral BID   senna-docusate  1 tablet Oral BID   tamsulosin  0.4 mg Oral QPC supper   Continuous Infusions:     LOS: 50 days   Charlynne Cousins  [facility]  07/15/2022, 7:30 AM

## 2022-07-16 NOTE — Progress Notes (Signed)
[facility] PROGRESS NOTE    Progress Note  [place]  GGY:[id number] DOB: 1966-08-26 DOA: 05/26/2022 PCP: [name], NP     Brief Narrative:   Wayne Stokes is an 56 y.o. male from [place] speaking, diabetes mellitus type 2, essential hypertension with lumbar disc disease with spinal stenosis, T12 compression fracture and mild cognitive impairment admitted for uncontrolled back pain and severe spinal lumbar stenosis orthopedic, neurosurgery evaluated the patient and recommended TLC L for pain control and outpatient follow-up.  Physical therapy recommended inpatient rehab difficult to place due to lack of insurance. IR was consulted to perform kyphoplasty of T12 compression fracture on 06/18/2022 Patient is medically stable for transfer awaiting placement to skilled nursing facility.   Assessment/Plan:   Severe back pain/lumbar spinal stenosis and T12 compression fraction: MRI did not show any cord injury neurosurgery was consulted recommended no surgical intervention the recommended TLCO. IR was consulted to perform kyphoplasty on 06/18/2022. Physical therapy evaluated the patient recommended skilled nursing facility is a challenge because he does not have insurance.  Leukocytosis/fever: Of unclear source now resolved. He was initially started on IV vancomycin and Rocephin Work-up was unrevealing, there is likely reactive due to T12 compression fracture MRI was done of the shoulder there was question septic arthritis ,IR aspirated again on 06/06/2022 that showed no signs of infection.  ID was consulted and antibiotics were discontinued.  Chronic right shoulder pain: Continue oral narcotics follow-up with orthopedic surgery as an outpatient. Was complaining of right wrist pain MRI showed inflammatory arthropathy no discrete drainable abscess. Has remained afebrile he relates his pain is controlled. CBC with differential showed no leukocytosis no left  shift.  Abdominal discomfort/microcytic anemia/iron deficiency anemia: CT scan of the abdomen pelvis did not show any acute findings. Hemoglobin has remained relatively stable.  Acute urinary retention/acute kidney injury: Felt to be secondary to BPH started on Flomax renal function stabilized after Foley was placed, has failed voiding trial 3 times. We will started on Prozac will need to follow-up with urology as an outpatient.  Constipation: Restart him on MiraLAX p.o. twice daily.  Microcytic anemia: Platelet count 437, check an anemia panel, he is already on ferrous sulfate  Vitamin D deficiency: Continue supplementation.  Hyponatremia: Likely chronic.  Hypokalemia/hypomagnesemia: Replete orally now resolved continue to monitor intermittently.  Sinus tachycardia: Resolved with metoprolol.  Diabetes mellitus type 2: Currently on sliding scale insulin has not required insulin for over 5 days. Stop checking CBGs.  Essential hypertension: Well-controlled on metoprolol.  Hyperlipidemia: Continue statins.    DVT prophylaxis: lovenox Family Communication:none Status is: Inpatient Remains inpatient appropriate because: Awaiting skilled nursing select placement.    Code Status:     Code Status Orders  (From admission, onward)           Start     Ordered   05/26/22 2254  Full code  Continuous        05/26/22 2254           Code Status History     This patient has a current code status but no historical code status.         IV Access:   Peripheral IV   Procedures and diagnostic studies:   No results found.   Medical Consultants:   None.   Subjective:    Wayne Stokes relates he can not urinate and it bothers him.  Objective:    Vitals:   07/15/22 1613 07/15/22 2259 07/16/22 0500 07/16/22 0719  BP: 109/86 124/85  (!) 129/91  Pulse: 80 80  85  Resp:  20    Temp: 98.2 F (36.8 C) 97.8 F (36.6 C)  (!) 97.5 F (36.4 C)  TempSrc:  Oral Oral  Oral  SpO2: 96% 92%  91%  Weight:   50.3 kg   Height:       SpO2: 91 % O2 Flow Rate (L/min): 2 L/min   Intake/Output Summary (Last 24 hours) at 07/16/2022 0744 Last data filed at 07/15/2022 2259 Gross per 24 hour  Intake 713 ml  Output 800 ml  Net -87 ml    Filed Weights   06/25/22 0500 07/02/22 0500 07/16/22 0500  Weight: 58.3 kg 53.7 kg 50.3 kg    Exam: General exam: In no acute distress. Respiratory system: Good air movement and clear to auscultation. Cardiovascular system: S1 & S2 heard, RRR. No JVD. Gastrointestinal system: Abdomen is nondistended, soft and nontender.  Extremities: No pedal edema. Skin: No rashes, lesions or ulcers   Data Reviewed:    Labs: Basic Metabolic Panel: Recent Labs  Lab 07/11/22 0939  NA 132*  K 3.8  CL 95*  CO2 25  GLUCOSE 86  BUN 9  CREATININE 1.14  CALCIUM 10.6*    GFR Estimated Creatinine Clearance: 50.6 mL/min (by C-G formula based on SCr of 1.14 mg/dL). Liver Function Tests: Recent Labs  Lab 07/11/22 0953  AST 33  ALT 7  ALKPHOS 89  BILITOT 0.9  PROT 7.7  ALBUMIN 1.5*    No results for input(s): "LIPASE", "AMYLASE" in the last 168 hours. No results for input(s): "AMMONIA" in the last 168 hours. Coagulation profile No results for input(s): "INR", "PROTIME" in the last 168 hours. COVID-19 Labs  No results for input(s): "DDIMER", "FERRITIN", "LDH", "CRP" in the last 72 hours.   Lab Results  Component Value Date   SARSCOV2NAA NEGATIVE 06/01/2022   SARSCOV2NAA NEGATIVE 05/07/2022    CBC: Recent Labs  Lab 07/10/22 0951  WBC 7.4  NEUTROABS 6.5  HGB 9.3*  HCT 28.6*  MCV 69.8*  PLT 426*    Cardiac Enzymes: No results for input(s): "CKTOTAL", "CKMB", "CKMBINDEX", "TROPONINI" in the last 168 hours. BNP (last 3 results) No results for input(s): "PROBNP" in the last 8760 hours. CBG: No results for input(s): "GLUCAP" in the last 168 hours.  D-Dimer: No results for input(s): "DDIMER" in  the last 72 hours. Hgb A1c: No results for input(s): "HGBA1C" in the last 72 hours.  Lipid Profile: No results for input(s): "CHOL", "HDL", "LDLCALC", "TRIG", "CHOLHDL", "LDLDIRECT" in the last 72 hours. Thyroid function studies: No results for input(s): "TSH", "T4TOTAL", "T3FREE", "THYROIDAB" in the last 72 hours.  Invalid input(s): "FREET3" Anemia work up: No results for input(s): "VITAMINB12", "FOLATE", "FERRITIN", "TIBC", "IRON", "RETICCTPCT" in the last 72 hours.  Sepsis Labs: Recent Labs  Lab 07/10/22 0951  WBC 7.4    Microbiology No results found for this or any previous visit (from the past 240 hour(s)).   Medications:    atorvastatin  40 mg Oral Daily   Chlorhexidine Gluconate Cloth  6 each Topical Daily   cholecalciferol  1,000 Units Oral Daily   colchicine  0.3 mg Oral BID   donepezil  5 mg Oral QHS   feeding supplement  237 mL Oral BID BM   ferrous sulfate  325 mg Oral Q breakfast   finasteride  5 mg Oral Daily   gabapentin  200 mg Oral TID   lidocaine  1 patch Transdermal Q24H  lidocaine  1 patch Transdermal Q24H   magnesium gluconate  500 mg Oral BID   methocarbamol  500 mg Oral TID   metoprolol succinate  25 mg Oral Daily   morphine  15 mg Oral Q12H   pantoprazole  40 mg Oral Daily   polyethylene glycol  17 g Oral BID   senna-docusate  1 tablet Oral BID   tamsulosin  0.4 mg Oral QPC supper   Continuous Infusions:     LOS: 51 days   Marinda Elk  [facility]  07/16/2022, 7:44 AM

## 2022-07-16 NOTE — TOC Progression Note (Addendum)
Transition of Care [facility]) - Progression Note    Patient Details  Name: Wayne Stokes MRN: [id number] Date of Birth: 01/09/1966  Transition of Care [facility]) CM/SW Contact  Lorri Frederick, LCSW Phone Number: 07/16/2022, 2:03 PM  Clinical Narrative:   Per QC, SNF referral sent to following facilities:  [facility]ac[facility] Axon, spoke with [facility], she will review.  [facility].  Spoke with Melina, admissions.  Referral faxed. She will review.  P: [phone], F: [phone].  [facility].  Unable to speak with Missy, admissions.  Referral faxed. P: [phone], F: [phone].    Email from [employer]  She confirmed medicaid application has been submitted.  Expected Discharge Plan: Skilled Nursing Facility Barriers to Discharge: Continued Medical Work up  Expected Discharge Plan and Services Expected Discharge Plan: Skilled Nursing Facility In-house Referral: Artist Discharge Planning Services: CM Consult                                           Social Determinants of Health (SDOH) Interventions    Readmission Risk Interventions     No data to display

## 2022-07-16 NOTE — Congregational Nurse Program (Signed)
Hospital visit with interpreter [facility].  Patient c/o nausea. His nurse Mitzi Davenport stated that he had refused all medicine including the sublingual Zofran for the nausea.  We counseled patient and he agreed to take Zofran.  Stated it helped and he requested a banana, apple and diet ginger ale. He ate a few bites of applesauce and drank small amount of water without nausea.  Patient stated he cannot find his phone. We searched his room and could not locate it.  Will check with hospital security.  Patient received Medicaid card.  Case worker Nghieng was notified and copy of Medicaid card made for his folder at [name].  He stated that he has been walking with assistance and feels good after walking.  Brantley Fling RN, Congregational Nurse [phone].

## 2022-07-16 NOTE — [place] FL2 (Signed)
[place] MEDICAID FL2 LEVEL OF CARE SCREENING TOOL     IDENTIFICATION  Patient Name: Wayne Stokes Birthdate: February 10, 1966 Sex: male Admission Date (Current Location): 05/26/2022  [facility] and [place] Number:  Producer, television/film/video and Address:  [facility] [place], [address] [place], [place] [address]      Provider Number: [id number]  Attending Physician Name and Address:  Marinda Elk, MD  Relative Name and Phone Number:  Jacqualin Combes   [phone]    Current Level of Care: Hospital Recommended Level of Care: Skilled Nursing Facility Prior Approval Number:    Date Approved/Denied:   PASRR Number: [id number]  Discharge Plan: SNF    Current Diagnoses: Patient Active Problem List   Diagnosis Date Noted   Chronic low back pain with bilateral sciatica    Pyogenic arthritis of right shoulder region [facility])    Discitis of lumbar region    Thrush, oral 06/01/2022   Right shoulder pain 06/01/2022   T12 compression fracture (HCC) 05/26/2022   Hyponatremia 05/26/2022   AKI (acute kidney injury) (HCC) 05/26/2022   Iron deficiency anemia 05/26/2022   Hypertension associated with diabetes (HCC) 05/26/2022   Hyperlipidemia associated with type 2 diabetes mellitus (HCC) 05/26/2022   Mild cognitive impairment 05/26/2022   Leukocytosis 05/26/2022   Acute urinary retention 05/26/2022   Thoracic compression fracture (HCC) 05/13/2022   Spinal stenosis of lumbar region with neurogenic claudication 05/13/2022   Type 2 diabetes mellitus without complication, without long-term current use of insulin (HCC) 03/17/2022    Orientation RESPIRATION BLADDER Height & Weight     Self, Time, Situation, Place  Normal Incontinent, Indwelling catheter Weight: 110 lb 14.3 oz (50.3 kg) Height:  5' (152.4 cm)  BEHAVIORAL SYMPTOMS/MOOD NEUROLOGICAL BOWEL NUTRITION STATUS      Continent Diet (see discharge Summary)  AMBULATORY STATUS COMMUNICATION OF NEEDS Skin   Limited  Assist Verbally Surgical wounds                       Personal Care Assistance Level of Assistance  Bathing, Feeding, Dressing Bathing Assistance: Maximum assistance Feeding assistance: Independent Dressing Assistance: Maximum assistance     Functional Limitations Info  Sight, Hearing, Speech Sight Info: Adequate Hearing Info: Adequate Speech Info: Adequate    SPECIAL CARE FACTORS FREQUENCY  PT (By licensed PT), OT (By licensed OT)     PT Frequency: 5x week OT Frequency: 5x week            Contractures Contractures Info: Not present    Additional Factors Info  Code Status, Allergies Code Status Info: full Allergies Info: Beef-derived Products, Shellfish Allergy, Eggs Or Egg-derived Products, Other   Insulin Sliding Scale Info: Novolog: see discharge summary       Current Medications (07/16/2022):  This is the current hospital active medication list Current Facility-Administered Medications  Medication Dose Route Frequency Provider Last Rate Last Admin   acetaminophen (TYLENOL) tablet 650 mg  650 mg Oral Q6H PRN Rolly Salter, MD       atorvastatin (LIPITOR) tablet 40 mg  40 mg Oral Daily Darreld Mclean R, MD   40 mg at 07/16/22 1052   bisacodyl (DULCOLAX) suppository 10 mg  10 mg Rectal Daily PRN Marguerita Merles Latif, DO   10 mg at 06/17/22 8756   Chlorhexidine Gluconate Cloth 2 % PADS 6 each  6 each Topical Daily Alberteen Sam, MD   6 each at 07/15/22 1059   cholecalciferol (VITAMIN  D3) 25 MCG (1000 UNIT) tablet 1,000 Units  1,000 Units Oral Daily Rolly Salter, MD   1,000 Units at 07/16/22 1052   colchicine tablet 0.3 mg  0.3 mg Oral BID Marinda Elk, MD   0.3 mg at 07/16/22 1051   donepezil (ARICEPT) tablet 5 mg  5 mg Oral QHS Darreld Mclean R, MD   5 mg at 07/15/22 2255   feeding supplement (ENSURE ENLIVE / ENSURE PLUS) liquid 237 mL  237 mL Oral BID BM Marinda Elk, MD   237 mL at 07/16/22 1052   ferrous sulfate tablet 325 mg  325 mg  Oral Q breakfast Alberteen Sam, MD   325 mg at 07/16/22 3474   finasteride (PROSCAR) tablet 5 mg  5 mg Oral Daily Marinda Elk, MD   5 mg at 07/16/22 1052   gabapentin (NEURONTIN) capsule 200 mg  200 mg Oral TID Jetty Duhamel T, MD   200 mg at 07/16/22 1051   lidocaine (LIDODERM) 5 % 1 patch  1 patch Transdermal Q24H Marguerita Merles [place], DO   1 patch at 07/15/22 1428   lidocaine (LIDODERM) 5 % 1 patch  1 patch Transdermal Q24H Burnadette Pop, MD   1 patch at 07/16/22 1052   lidocaine (XYLOCAINE) 2 % viscous mouth solution 15 mL  15 mL Mouth/Throat Q3H PRN Marinda Elk, MD       magnesium gluconate (MAGONATE) tablet 500 mg  500 mg Oral BID Jetty Duhamel T, MD   500 mg at 07/16/22 1052   methocarbamol (ROBAXIN) tablet 500 mg  500 mg Oral TID Rolly Salter, MD   500 mg at 07/16/22 1052   metoprolol succinate (TOPROL-XL) 24 hr tablet 25 mg  25 mg Oral Daily Marinda Elk, MD   25 mg at 07/16/22 1052   morphine (MS CONTIN) 12 hr tablet 15 mg  15 mg Oral Q12H Jetty Duhamel T, MD   15 mg at 07/16/22 1051   morphine (PF) 2 MG/ML injection 4 mg  4 mg Intravenous Q3H PRN Marinda Elk, MD       Muscle Rub CREA 1 Application  1 Application Topical PRN Marguerita Merles [place], DO   1 Application at 06/14/22 2111   ondansetron (ZOFRAN) tablet 4 mg  4 mg Oral Q6H PRN Charlsie Quest, MD   4 mg at 07/15/22 1106   Oral care mouth rinse  15 mL Mouth Rinse PRN Lonia Blood, MD       pantoprazole (PROTONIX) EC tablet 40 mg  40 mg Oral Daily Burnadette Pop, MD   40 mg at 07/16/22 1052   polyethylene glycol (MIRALAX / GLYCOLAX) packet 17 g  17 g Oral BID Marinda Elk, MD   17 g at 07/14/22 1020   senna-docusate (Senokot-S) tablet 1 tablet  1 tablet Oral BID Marguerita Merles [place], DO   1 tablet at 07/16/22 1052   simethicone (MYLICON) chewable tablet 80 mg  80 mg Oral QID PRN Briscoe Deutscher, MD   80 mg at 06/02/22 0451   sodium chloride (PF) 0.9 % injection 10  mL  10 mL Other PRN Kathlynn Grate, DO   10 mL at 06/11/22 1019   tamsulosin (FLOMAX) capsule 0.4 mg  0.4 mg Oral QPC supper Rolly Salter, MD   0.4 mg at 07/15/22 1742   traMADol (ULTRAM) tablet 50 mg  50 mg Oral Q6H PRN Marinda Elk, MD  Discharge Medications: Please see discharge summary for a list of discharge medications.  Relevant Imaging Results:  Relevant Lab Results:   Additional Information SS# [id number]  Lorri Frederick, LCSW

## 2022-07-16 NOTE — Plan of Care (Signed)
  Problem: Health Behavior/Discharge Planning: Goal: Ability to manage health-related needs will improve Outcome: Progressing   Problem: Activity: Goal: Risk for activity intolerance will decrease Outcome: Progressing   Problem: Coping: Goal: Level of anxiety will decrease Outcome: Progressing   Problem: Metabolic: Goal: Ability to maintain appropriate glucose levels will improve Outcome: Progressing   Problem: Nutritional: Goal: Maintenance of adequate nutrition will improve Outcome: Progressing

## 2022-07-17 DIAGNOSIS — R339 Retention of urine, unspecified: Secondary | ICD-10-CM

## 2022-07-17 DIAGNOSIS — M48061 Spinal stenosis, lumbar region without neurogenic claudication: Secondary | ICD-10-CM

## 2022-07-17 MED ORDER — BOOST / RESOURCE BREEZE PO LIQD CUSTOM
1.0000 | Freq: Three times a day (TID) | ORAL | Status: DC
  Administered 2022-07-17 – 2022-07-23 (×6): 1 via ORAL

## 2022-07-17 NOTE — Progress Notes (Signed)
[facility] PROGRESS NOTE    Progress Note  Wayne Stokes  VXB:[id number] DOB: Jan 03, 1966 DOA: 05/26/2022 PCP: [name], NP     Brief Narrative:   Wayne Stokes is an 56 y.o. male from [place] speaking, diabetes mellitus type 2, essential hypertension with lumbar disc disease with spinal stenosis, T12 compression fracture and mild cognitive impairment admitted for uncontrolled back pain and severe spinal lumbar stenosis orthopedic, neurosurgery evaluated the patient and recommended TLC L for pain control and outpatient follow-up.  Physical therapy recommended inpatient rehab difficult to place due to lack of insurance. IR was consulted to perform kyphoplasty of T12 compression fracture on 06/18/2022 Patient is medically stable for transfer awaiting placement to skilled nursing facility.   Assessment/Plan:   Severe back pain/lumbar spinal stenosis and T12 compression fraction: MRI did not show any cord injury neurosurgery was consulted recommended no surgical intervention the recommended TLCO. IR was consulted to perform kyphoplasty on 06/18/2022. Physical therapy evaluated the patient recommended skilled nursing facility is a challenge because he does not have insurance.  Leukocytosis/fever: Of unclear source now resolved. He was initially started on IV vancomycin and Rocephin Work-up was unrevealing, there is likely reactive due to T12 compression fracture MRI was done of the shoulder there was question septic arthritis ,IR aspirated again on 06/06/2022 that showed no signs of infection.  ID was consulted and antibiotics were discontinued.  Chronic right shoulder pain: Continue oral narcotics follow-up with orthopedic surgery as an outpatient. Was complaining of right wrist pain MRI showed inflammatory arthropathy no discrete drainable abscess. Has remained afebrile he relates his pain is controlled. CBC with differential showed no leukocytosis no left  shift.  Abdominal discomfort/microcytic anemia/iron deficiency anemia: CT scan of the abdomen pelvis did not show any acute findings. Hemoglobin has remained relatively stable.  Acute urinary retention/acute kidney injury: Felt to be secondary to BPH started on Flomax renal function stabilized after Foley was placed, has failed voiding trial 3 times. We will started on Proscar will need to follow-up with urology as an outpatient.  Constipation: - MiraLAX p.o. twice daily.  Microcytic anemia: Platelet count 437, check an anemia panel, he is already on ferrous sulfate  Vitamin D deficiency: Continue supplementation.  Hyponatremia: Likely chronic.  Hypokalemia/hypomagnesemia: Replete orally now resolved continue to monitor intermittently.  Sinus tachycardia: Resolved with metoprolol.  Diabetes mellitus type 2: Currently on sliding scale insulin has not required insulin for over 5 days. Stop checking CBGs. -diet controlled  Essential hypertension: Well-controlled on metoprolol.  Hyperlipidemia: Continue statins.    DVT prophylaxis: lovenox Family Communication: at bedside (interpreter never showed) Status is: Inpatient Remains inpatient appropriate because: Awaiting skilled nursing select placement.    Code Status: full          Medical Consultants:   ID IR   Subjective:   Some nausea this AM  Objective:    Vitals:   07/16/22 1509 07/17/22 0430 07/17/22 0500 07/17/22 0700  BP: 115/88 112/63  116/89  Pulse: 89 71  80  Resp:  16  18  Temp: 98.3 F (36.8 C) 98.3 F (36.8 C)  98.5 F (36.9 C)  TempSrc: Oral   Oral  SpO2: 94% 96%  98%  Weight:   50 kg   Height:       SpO2: 98 % O2 Flow Rate (L/min): 2 L/min   Intake/Output Summary (Last 24 hours) at 07/17/2022 1136 Last data filed at 07/17/2022 0600 Gross per 24 hour  Intake --  Output  680 ml  Net -680 ml   Filed Weights   07/02/22 0500 07/16/22 0500 07/17/22 0500  Weight: 53.7 kg  50.3 kg 50 kg    Exam:  General: Appearance:    Well developed, well nourished male in no acute distress- sitting in chair     Lungs:      respirations unlabored  Heart:    Normal heart rate.   MS:   All extremities are intact.   Neurologic:   Awake, alert      Data Reviewed:    Labs: Basic Metabolic Panel: Recent Labs  Lab 07/11/22 0939  NA 132*  K 3.8  CL 95*  CO2 25  GLUCOSE 86  BUN 9  CREATININE 1.14  CALCIUM 10.6*   GFR Estimated Creatinine Clearance: 50.6 mL/min (by C-G formula based on SCr of 1.14 mg/dL). Liver Function Tests: Recent Labs  Lab 07/11/22 0953  AST 33  ALT 7  ALKPHOS 89  BILITOT 0.9  PROT 7.7  ALBUMIN 1.5*   No results for input(s): "LIPASE", "AMYLASE" in the last 168 hours. No results for input(s): "AMMONIA" in the last 168 hours. Coagulation profile No results for input(s): "INR", "PROTIME" in the last 168 hours. COVID-19 Labs  No results for input(s): "DDIMER", "FERRITIN", "LDH", "CRP" in the last 72 hours.   Lab Results  Component Value Date   SARSCOV2NAA NEGATIVE 06/01/2022   [place] NEGATIVE 05/07/2022    CBC: No results for input(s): "WBC", "NEUTROABS", "HGB", "HCT", "MCV", "PLT" in the last 168 hours. Cardiac Enzymes: No results for input(s): "CKTOTAL", "CKMB", "CKMBINDEX", "TROPONINI" in the last 168 hours. BNP (last 3 results) No results for input(s): "PROBNP" in the last 8760 hours. CBG: No results for input(s): "GLUCAP" in the last 168 hours.  D-Dimer: No results for input(s): "DDIMER" in the last 72 hours. Hgb A1c: No results for input(s): "HGBA1C" in the last 72 hours.  Lipid Profile: No results for input(s): "CHOL", "HDL", "LDLCALC", "TRIG", "CHOLHDL", "LDLDIRECT" in the last 72 hours. Thyroid function studies: No results for input(s): "TSH", "T4TOTAL", "T3FREE", "THYROIDAB" in the last 72 hours.  Invalid input(s): "FREET3" Anemia work up: No results for input(s): "VITAMINB12", "FOLATE",  "FERRITIN", "TIBC", "IRON", "RETICCTPCT" in the last 72 hours.  Sepsis Labs: No results for input(s): "PROCALCITON", "WBC", "LATICACIDVEN" in the last 168 hours. Microbiology No results found for this or any previous visit (from the past 240 hour(s)).   Medications:    atorvastatin  40 mg Oral Daily   Chlorhexidine Gluconate Cloth  6 each Topical Daily   cholecalciferol  1,000 Units Oral Daily   colchicine  0.3 mg Oral BID   donepezil  5 mg Oral QHS   feeding supplement  1 Container Oral TID BM   feeding supplement  237 mL Oral BID BM   ferrous sulfate  325 mg Oral Q breakfast   finasteride  5 mg Oral Daily   gabapentin  200 mg Oral TID   lidocaine  1 patch Transdermal Q24H   lidocaine  1 patch Transdermal Q24H   magnesium gluconate  500 mg Oral BID   methocarbamol  500 mg Oral TID   metoprolol succinate  25 mg Oral Daily   morphine  15 mg Oral Q12H   pantoprazole  40 mg Oral Daily   polyethylene glycol  17 g Oral BID   senna-docusate  1 tablet Oral BID   tamsulosin  0.4 mg Oral QPC supper   Continuous Infusions:     LOS: 52 days  Joseph Art  [facility]  07/17/2022, 11:36 AM

## 2022-07-17 NOTE — Plan of Care (Signed)
  Problem: Education: Goal: Knowledge of General Education information will improve Description: Including pain rating scale, medication(s)/side effects and non-pharmacologic comfort measures Outcome: Progressing   Problem: Health Behavior/Discharge Planning: Goal: Ability to manage health-related needs will improve Outcome: Progressing   Problem: Clinical Measurements: Goal: Ability to maintain clinical measurements within normal limits will improve Outcome: Progressing Goal: Diagnostic test results will improve Outcome: Progressing   Problem: Activity: Goal: Risk for activity intolerance will decrease Outcome: Progressing   Problem: Coping: Goal: Level of anxiety will decrease Outcome: Progressing   Problem: Elimination: Goal: Will not experience complications related to bowel motility Outcome: Progressing Goal: Will not experience complications related to urinary retention Outcome: Progressing   Problem: Pain Managment: Goal: General experience of comfort will improve Outcome: Progressing   Problem: Safety: Goal: Ability to remain free from injury will improve Outcome: Progressing   Problem: Skin Integrity: Goal: Risk for impaired skin integrity will decrease Outcome: Progressing   Problem: Education: Goal: Ability to describe self-care measures that may prevent or decrease complications (Diabetes Survival Skills Education) will improve Outcome: Progressing Goal: Individualized Educational Video(s) Outcome: Progressing   Problem: Coping: Goal: Ability to adjust to condition or change in health will improve Outcome: Progressing   Problem: Fluid Volume: Goal: Ability to maintain a balanced intake and output will improve Outcome: Progressing   Problem: Health Behavior/Discharge Planning: Goal: Ability to identify and utilize available resources and services will improve Outcome: Progressing Goal: Ability to manage health-related needs will improve Outcome:  Progressing   Problem: Metabolic: Goal: Ability to maintain appropriate glucose levels will improve Outcome: Progressing   Problem: Nutritional: Goal: Maintenance of adequate nutrition will improve Outcome: Progressing Goal: Progress toward achieving an optimal weight will improve Outcome: Progressing   Problem: Skin Integrity: Goal: Risk for impaired skin integrity will decrease Outcome: Progressing   Problem: Tissue Perfusion: Goal: Adequacy of tissue perfusion will improve Outcome: Progressing   

## 2022-07-17 NOTE — Progress Notes (Signed)
Occupational Therapy Treatment Patient Details Name: Wayne Stokes MRN: [id number] DOB: [birth year] Today's Date: 07/17/2022   History of present illness 56 y.o. male presented 05/26/22 with c/o severe and progressively worsening low back pain that radiates into his left buttock and down his LLE. Imaging +acute T12 compression fracture and  L4/5 there is multifactorial severe spinal stenosis and severe bilateral foraminal stenosis. Neurosurgery consult with no acute surgical intervention needed. IR consulted for possible kyphoplasty bu twas delayed due to fever; S/p T12 KP with biopsy on 10/11. 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   OT comments  Patient received in supine and his nieces were present and assisted as interpretor. Patient required min assist to get to EOB with cues for sequencing. Patient had dry heaves while on EOB and stated he felt better after a few minutes. Back brace was donned and patient required mod assist to stand. Patient began to walk towards bathroom for grooming tasks but stated he could not walk and transferred into recliner. Patient performed grooming seated in recliner with fewer complaints of pain. Patient agreeable to take pain meds with nieces encouragement. Acute OT to continue to follow.    Recommendations for follow up therapy are one component of a multi-disciplinary discharge planning process, led by the attending physician.  Recommendations may be updated based on patient status, additional functional criteria and insurance authorization.    Follow Up Recommendations  Skilled nursing-short term rehab (<3 hours/day)    Assistance Recommended at Discharge Frequent or constant Supervision/Assistance  Patient can return home with the following  A little help with walking and/or transfers;Assistance with cooking/housework;Assist for transportation;Help with  stairs or ramp for entrance;A lot of help with bathing/dressing/bathroom;Direct supervision/assist for medications management   Equipment Recommendations  BSC/3in1    Recommendations for Other Services      Precautions / Restrictions Precautions Precautions: Fall;Back Precaution Booklet Issued: No Precaution Comments: Back precautions for comfort Required Braces or Orthoses: Spinal Brace Spinal Brace: Thoracolumbosacral orthotic Restrictions Weight Bearing Restrictions: No       Mobility Bed Mobility Overal bed mobility: Needs Assistance Bed Mobility: Supine to Sit     Supine to sit: Min assist     General bed mobility comments: increased time and multimodal cues for sequencing    Transfers Overall transfer level: Needs assistance Equipment used: Rolling walker (2 wheels) Transfers: Sit to/from Stand Sit to Stand: Mod assist     Step pivot transfers: Mod assist     General transfer comment: mod assist to stand from EOB, patient begain ambulating to bathroom and stated he could not walk and sat in recliner     Balance Overall balance assessment: Needs assistance Sitting-balance support: Feet supported, No upper extremity supported Sitting balance-Leahy Scale: Fair Sitting balance - Comments: able to maintain sitting balance while donning back brace   Standing balance support: During functional activity, Bilateral upper extremity supported, Reliant on assistive device for balance Standing balance-Leahy Scale: Poor Standing balance comment: reliant on RW                           ADL either performed or assessed with clinical judgement   ADL Overall ADL's : Needs assistance/impaired     Grooming: Wash/dry hands;Wash/dry face;Oral care;Supervision/safety;Sitting Grooming Details (indicate cue type and reason): patient stated he felt too weak to walk to bathroom and perform grooming at sink  General ADL  Comments: limited mobility due to patient stating he felt weak    Extremity/Trunk Assessment              Vision       Perception     Praxis      Cognition Arousal/Alertness: Awake/alert Behavior During Therapy: Impulsive, Flat affect Overall Cognitive Status: Impaired/Different from baseline Area of Impairment: Safety/judgement, Following commands, Problem solving, Memory                 Orientation Level: Disoriented to, Time, Situation   Memory: Decreased recall of precautions, Decreased short-term memory Following Commands: Follows multi-step commands inconsistently, Follows one step commands inconsistently Safety/Judgement: Decreased awareness of safety, Decreased awareness of deficits Awareness: Intellectual Problem Solving: Slow processing, Requires verbal cues, Decreased initiation, Difficulty sequencing General Comments: patient's neices present during session and assisted with interpreting.        Exercises      Shoulder Instructions       General Comments      Pertinent Vitals/ Pain       Pain Assessment Pain Assessment: Faces Faces Pain Scale: Hurts even more Pain Location: back Pain Descriptors / Indicators: Moaning, Grimacing, Sore Pain Intervention(s): Limited activity within patient's tolerance, Monitored during session, Repositioned, RN gave pain meds during session  Home Living                                          Prior Functioning/Environment              Frequency  Min 2X/week        Progress Toward Goals  OT Goals(current goals can now be found in the care plan section)  Progress towards OT goals: Progressing toward goals  Acute Rehab OT Goals Patient Stated Goal: get better OT Goal Formulation: With patient Time For Goal Achievement: 07/24/22 Potential to Achieve Goals: Fair ADL Goals Pt Will Perform Grooming: with supervision;standing Pt Will Perform Lower Body Dressing: with  supervision;sit to/from stand Pt Will Transfer to Toilet: with supervision;ambulating;regular height toilet Pt Will Perform Toileting - Clothing Manipulation and hygiene: with supervision;sit to/from stand Pt/caregiver will Perform Home Exercise Program: Increased strength;Both right and left upper extremity;With theraputty Additional ADL Goal #1: Pt will perform bed mobility independently in preparation for ADLs. Additional ADL Goal #2: Pt will participate in 30 minutes of activity with intermittent rest breaks. Additional ADL Goal #3: Pt will follow 2 step commands with 50% accuracy with interpreter.  Plan Discharge plan remains appropriate    Co-evaluation                 AM-PAC OT "6 Clicks" Daily Activity     Outcome Measure   Help from another person eating meals?: None Help from another person taking care of personal grooming?: A Little Help from another person toileting, which includes using toliet, bedpan, or urinal?: A Little Help from another person bathing (including washing, rinsing, drying)?: A Little Help from another person to put on and taking off regular upper body clothing?: A Little Help from another person to put on and taking off regular lower body clothing?: A Lot 6 Click Score: 18    End of Session Equipment Utilized During Treatment: Back brace  OT Visit Diagnosis: Other abnormalities of gait and mobility (R26.89);Muscle weakness (generalized) (M62.81);Pain   Activity Tolerance Patient limited by pain   Patient Left  in chair;with call bell/phone within reach;with chair alarm set;with family/visitor present   Nurse Communication Mobility status        Time: [phone] OT Time Calculation (min): 25 min  Charges: OT General Charges $OT Visit: 1 Visit OT Treatments $Self Care/Home Management : 23-37 mins  [name], OTA Acute Rehabilitation Services  Office [phone]   [name] 07/17/2022, 11:42 AM

## 2022-07-17 NOTE — Progress Notes (Signed)
Pt. Was able to get some of his morning meds down with before becoming nauseous. He prefers meds not be crushed and to have it with room temp water. Family at bedside

## 2022-07-17 NOTE — Progress Notes (Signed)
Physical Therapy Treatment Patient Details Name: Wayne Stokes MRN: [id number] DOB: 04/11/1966 Today's Date: 07/17/2022   History of Present Illness 56 y.o. male presented 05/26/22 with c/o severe and progressively worsening low back pain that radiates into his left buttock and down his LLE. Imaging +acute T12 compression fracture and  L4/5 there is multifactorial severe spinal stenosis and severe bilateral foraminal stenosis. Neurosurgery consult with no acute surgical intervention needed. IR consulted for possible kyphoplasty bu twas delayed due to fever; S/p T12 KP with biopsy on 10/11. Pt coughing with water consumption 11/9 DO notified he may need SLP consult. 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    PT Comments    Pt received in supine, agreeable to therapy session with encouragement. Pt making good progress toward goals this date, able to stand and perform gait trials x2 with close chair follow for safety. Pt needing up to minA to perform functional mobility tasks. Of note, pt with episodes of coughing/nausea when consuming water and reports n/v with meals, RN/DO notified he may benefit from SLP consult. Pt continues to benefit from PT services to progress toward functional mobility goals.    Recommendations for follow up therapy are one component of a multi-disciplinary discharge planning process, led by the attending physician.  Recommendations may be updated based on patient status, additional functional criteria and insurance authorization.  Follow Up Recommendations  Skilled nursing-short term rehab (<3 hours/day) Can patient physically be transported by private vehicle: Yes   Assistance Recommended at Discharge Frequent or constant Supervision/Assistance  Patient can return home with the following Assistance with cooking/housework;Direct supervision/assist for medications  management;Direct supervision/assist for financial management;Assist for transportation;Help with stairs or ramp for entrance;A lot of help with walking and/or transfers   Equipment Recommendations  BSC/3in1;Rollator (4 wheels)    Recommendations for Other Services       Precautions / Restrictions Precautions Precautions: Fall;Back Precaution Booklet Issued: No Precaution Comments: Back precautions for comfort Required Braces or Orthoses: Spinal Brace Spinal Brace: Thoracolumbosacral orthotic Restrictions Weight Bearing Restrictions: No     Mobility  Bed Mobility Overal bed mobility: Needs Assistance Bed Mobility: Rolling, Sidelying to Sit Rolling: Min assist Sidelying to sit: Min assist     Sit to sidelying: Min assist General bed mobility comments: increased time and multimodal cues for sequencing; log roll for back comfort; trunk assist needed to raise up from flat bed    Transfers Overall transfer level: Needs assistance Equipment used: Rolling walker (2 wheels) Transfers: Sit to/from Stand Sit to Stand: Mod assist           General transfer comment: mod assist to stand from EOB<>RW    Ambulation/Gait Ambulation/Gait assistance: Min assist, +2 safety/equipment Gait Distance (Feet): 18 Feet (8ft, seated break, 70ft) Assistive device: Rolling walker (2 wheels) Gait Pattern/deviations: Step-through pattern, Decreased stride length, Wide base of support, Trunk flexed       General Gait Details: close chair follow for safety; pt needed seated break after ~78ft and needed heavy encouragement to reach this distance; after break, pt returned to bedside from chair ~60ft away; minA for RW assist and stability with gait belt     Balance Overall balance assessment: Needs assistance Sitting-balance support: Feet supported, No upper extremity supported Sitting balance-Leahy Scale: Fair Sitting balance - Comments: able to maintain sitting balance while donning back  brace   Standing balance support: During functional activity, Bilateral upper extremity  supported, Reliant on assistive device for balance Standing balance-Leahy Scale: Poor Standing balance comment: reliant on RW                            Cognition Arousal/Alertness: Awake/alert Behavior During Therapy: Impulsive, Flat affect Overall Cognitive Status: Impaired/Different from baseline Area of Impairment: Safety/judgement, Following commands, Problem solving, Memory                 Orientation Level: Disoriented to, Time, Situation   Memory: Decreased recall of precautions, Decreased short-term memory Following Commands: Follows multi-step commands inconsistently, Follows one step commands inconsistently Safety/Judgement: Decreased awareness of safety, Decreased awareness of deficits Awareness: Intellectual Problem Solving: Slow processing, Requires verbal cues, Decreased initiation, Difficulty sequencing General Comments: Translator unavailable at time of session and family not present, pt responding in english to simple instructions with some visual demo at times given. Pt following most 1-step commands well today with encouragement.        Exercises General Exercises - Lower Extremity Long Arc Quad: AROM, 20 reps, Both, Seated (cues for slower pace and improved B knee extension) Hip Flexion/Marching: AROM, Both, 20 reps, Seated    General Comments General comments (skin integrity, edema, etc.): pt reports he has nausea/vomiting after eating and did have significant coughing after drinking water on x2 occasions in the room, RN and DO notified he would benefit from SLP consult. Pt voluntarily requesting to eat only softer items from tray such as soup (brought from family) and fruit cup in juice.      Pertinent Vitals/Pain Pain Assessment Pain Assessment: Faces Faces Pain Scale: Hurts even more Pain Location: back with sitting and standing Pain Descriptors /  Indicators: Moaning, Grimacing, Sore Pain Intervention(s): Monitored during session, Limited activity within patient's tolerance, Repositioned, Premedicated before session           PT Goals (current goals can now be found in the care plan section) Acute Rehab PT Goals Patient Stated Goal: decreased pain PT Goal Formulation: With patient Time For Goal Achievement: 07/22/22 Progress towards PT goals: Progressing toward goals    Frequency    Min 3X/week      PT Plan Current plan remains appropriate       AM-PAC PT "6 Clicks" Mobility   Outcome Measure  Help needed turning from your back to your side while in a flat bed without using bedrails?: A Little Help needed moving from lying on your back to sitting on the side of a flat bed without using bedrails?: A Lot Help needed moving to and from a bed to a chair (including a wheelchair)?: A Lot Help needed standing up from a chair using your arms (e.g., wheelchair or bedside chair)?: A Lot Help needed to walk in hospital room?: A Lot (chair follow for safety) Help needed climbing 3-5 steps with a railing? : Total 6 Click Score: 12    End of Session Equipment Utilized During Treatment: Back brace;Gait belt Activity Tolerance: Patient tolerated treatment well Patient left: in bed;with call bell/phone within reach;with bed alarm set;Other (comment) (HOB elevated >40* to eat his lunch and pillow under RUE for comfort and ease of reaching tray table/spoon) Nurse Communication: Mobility status;Precautions;Other (comment) (may need SLP consult; DO also notified) PT Visit Diagnosis: Other abnormalities of gait and mobility (R26.89);Muscle weakness (generalized) (M62.81);Pain Pain - Right/Left: Right Pain - part of body: Shoulder (and LBP)     Time: 2919-1660 PT Time Calculation (min) (ACUTE ONLY):  25 min  Charges:  $Gait Training: 8-22 mins $Therapeutic Activity: 8-22 mins                     Malini Flemings P., PTA Acute  Rehabilitation Services Secure Chat Preferred 9a-5:30pm Office: [phone]    Angus Palms 07/17/2022, 4:32 PM

## 2022-07-18 ENCOUNTER — Inpatient Hospital Stay (HOSPITAL_COMMUNITY): Payer: Medicaid Other

## 2022-07-18 LAB — BASIC METABOLIC PANEL
Anion gap: 8 (ref 5–15)
BUN: 9 mg/dL (ref 6–20)
CO2: 29 mmol/L (ref 22–32)
Calcium: 11.9 mg/dL — ABNORMAL HIGH (ref 8.9–10.3)
Chloride: 98 mmol/L (ref 98–111)
Creatinine, Ser: 1.03 mg/dL (ref 0.61–1.24)
GFR, Estimated: >60 mL/min (ref >60)
Glucose, Bld: 73 mg/dL (ref 70–99)
Potassium: 3.2 mmol/L — ABNORMAL LOW (ref 3.5–5.1)
Sodium: 135 mmol/L (ref 135–145)

## 2022-07-18 LAB — CBC
HCT: 27 % — ABNORMAL LOW (ref 39.0–52.0)
Hemoglobin: 8.8 g/dL — ABNORMAL LOW (ref 13.0–17.0)
MCH: 22.4 pg — ABNORMAL LOW (ref 26.0–34.0)
MCHC: 32.6 g/dL (ref 30.0–36.0)
MCV: 68.9 fL — ABNORMAL LOW (ref 80.0–100.0)
Platelets: 402 10*3/uL — ABNORMAL HIGH (ref 150–400)
RBC: 3.92 MIL/uL — ABNORMAL LOW (ref 4.22–5.81)
RDW: 22.8 % — ABNORMAL HIGH (ref 11.5–15.5)
WBC: 5.7 10*3/uL (ref 4.0–10.5)
nRBC: 0 % (ref 0.0–0.2)

## 2022-07-18 LAB — PHOSPHORUS: Phosphorus: 2.6 mg/dL (ref 2.5–4.6)

## 2022-07-18 LAB — MAGNESIUM: Magnesium: 1.4 mg/dL — ABNORMAL LOW (ref 1.7–2.4)

## 2022-07-18 MED ORDER — MIRTAZAPINE 15 MG PO TBDP
15.0000 mg | ORAL_TABLET | Freq: Every day | ORAL | Status: DC
  Administered 2022-07-18 – 2022-07-19 (×2): 15 mg via ORAL
  Filled 2022-07-18 (×2): qty 1

## 2022-07-18 MED ORDER — POTASSIUM CHLORIDE 10 MEQ/100ML IV SOLN
10.0000 meq | INTRAVENOUS | Status: AC
  Administered 2022-07-18 (×3): 10 meq via INTRAVENOUS
  Filled 2022-07-18 (×3): qty 100

## 2022-07-18 MED ORDER — SODIUM CHLORIDE 0.9 % IV SOLN
1.0000 mg | Freq: Once | INTRAVENOUS | Status: AC
  Administered 2022-07-18: 1 mg via INTRAVENOUS
  Filled 2022-07-18: qty 0.2

## 2022-07-18 MED ORDER — ONDANSETRON HCL 4 MG/2ML IJ SOLN
4.0000 mg | Freq: Four times a day (QID) | INTRAMUSCULAR | Status: AC
  Administered 2022-07-18 – 2022-07-20 (×8): 4 mg via INTRAVENOUS
  Filled 2022-07-18 (×8): qty 2

## 2022-07-18 MED ORDER — NYSTATIN 100000 UNIT/ML MT SUSP
5.0000 mL | Freq: Four times a day (QID) | OROMUCOSAL | Status: DC
  Administered 2022-07-18 – 2022-07-30 (×40): 500000 [IU] via ORAL
  Filled 2022-07-18 (×43): qty 5

## 2022-07-18 MED ORDER — THIAMINE HCL 100 MG/ML IJ SOLN
100.0000 mg | Freq: Every day | INTRAMUSCULAR | Status: DC
  Administered 2022-07-18 – 2022-07-20 (×3): 100 mg via INTRAVENOUS
  Filled 2022-07-18 (×3): qty 2

## 2022-07-18 MED ORDER — SODIUM CHLORIDE 0.9 % IV SOLN: INTRAVENOUS | Status: DC

## 2022-07-18 MED ORDER — OSMOLITE 1.5 CAL PO LIQD
1000.0000 mL | ORAL | Status: DC
  Administered 2022-07-18 – 2022-07-28 (×6): 1000 mL

## 2022-07-18 NOTE — TOC Progression Note (Signed)
Transition of Care [facility]) - Progression Note    Patient Details  Name: Wayne Stokes MRN: [id number] Date of Birth: 1965/10/02  [facility] CM/SW Contact  Joanne Chars, LCSW Phone Number: 07/18/2022, 8:52 AM  Clinical Narrative:   [facility] came by in person, met with pt face to face.  She may be able to offer bed, still needs approval from her admin.  Will call once she has answer.    Expected Discharge Plan: [place] Barriers to Discharge: Continued Medical Work up  Expected Discharge Plan and Services Expected Discharge Plan: [place] In-house Referral: Development worker, community Discharge Planning Services: CM Consult                                           Social Determinants of Health (SDOH) Interventions    Readmission Risk Interventions     No data to display

## 2022-07-18 NOTE — Procedures (Signed)
Cortrak  Tube Type:  Cortrak - 43 inches Tube Location:  Left nare Initial Placement:  Stomach Secured by: Bridle Technique Used to Measure Tube Placement:  Marking at nare/corner of mouth Cortrak Secured At:  65 cm   Cortrak Tube Team Note:  Consult received to place a Cortrak feeding tube.   X-ray is required, abdominal x-ray has been ordered by the Cortrak team. Please confirm tube placement before using the Cortrak tube.   If the tube becomes dislodged please keep the tube and contact the Cortrak team at [email or web address] for replacement.  If after hours and replacement cannot be delayed, place a NG tube and confirm placement with an abdominal x-ray.    Betsey Holiday MS, RD, LDN Please refer to [facility] for RD and/or RD on-call/weekend/after hours pager

## 2022-07-18 NOTE — Progress Notes (Addendum)
Nutrition Follow-up  DOCUMENTATION CODES:   Severe malnutrition in context of acute illness/injury  INTERVENTION:  - Continue Dys 1, Nectar thick liquids.  - Initiate Osmolite 1.5 @ 10 mL/hr and advance 5 mL/hr Q12H to goal rate of 50 mL/hr  - This will provide the pt with 1800 kcals and 75 gm protein 915 mL free water.  - Will hold off on adding FWF for now due to low Na and IVF of NS @ 100 mL/hr scheduled.    NUTRITION DIAGNOSIS:   Severe Malnutrition related to acute illness as evidenced by energy intake < or equal to 50% for > or equal to 5 days, severe fat depletion, severe muscle depletion, percent weight loss (20% wt loss in 2 months).  GOAL:   Patient will meet greater than or equal to 90% of their needs  MONITOR:   PO intake, Supplement acceptance, TF tolerance  REASON FOR ASSESSMENT:   Consult Assessment of nutrition requirement/status  ASSESSMENT:   56 y.o. male admits related to evaluation of worsening lower back pain. PMH includes: T2DM, HTN, anemia, AKI. 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  Meds include: Vit D3, ferrous sulfate, magnesium gluconate, miralax, senokot. Labs reviewed: K low, Calcium high.   Calorie count was completed on 11/3 and Cortrak tube was recommended. Pt has experienced a 20% wt loss in 2 months. Pt qualifies for Severe Malnutrition. The pt has had poor PO intakes for at least the past month. Pt is having difficulty swallowing and feels like he is choking. SLP evaluated the patient and has downgraded the pt to Dys1 diet with Nectar thick liquids. Plan is to perform MBS on Monday to determine the underlying cause of dysphagia.   RD was able to discuss Cortrak with pt with interpreter at bedside. Pt states that he is agreeable to tube as he feels he will not be able to eat or drink enough to meet his needs due to choking. Pt is at  high risk for refeeding. RD will discuss with MD about potassium repletion as well as thiamine and folic acid supplementation. RD will order slow advancement for TF regimen.    Flowsheet Row Most Recent Value  Orbital Region Moderate depletion  Upper Arm Region Severe depletion  Thoracic and Lumbar Region Severe depletion  Buccal Region Moderate depletion  Temple Region Severe depletion  Clavicle Bone Region Severe depletion  Clavicle and Acromion Bone Region Severe depletion  Scapular Bone Region Unable to assess  Dorsal Hand No depletion  [hands are swollen]  Patellar Region Moderate depletion  Anterior Thigh Region Moderate depletion  Posterior Calf Region Moderate depletion  Edema (RD Assessment) None  Hair Reviewed  Eyes Reviewed  Mouth Reviewed  [possible thrush?]  Skin Reviewed  Nails Reviewed       Diet Order:   Diet Order             DIET - DYS 1 Room service appropriate? No; Fluid consistency: Nectar Thick  Diet effective now                   EDUCATION NEEDS:   Education needs have been addressed  Skin:  Skin Assessment: Reviewed RN Assessment  Last BM:  07/18/22  Height:   Ht Readings from Last 1 Encounters:  06/01/22 5' (1.524 m)    Weight:   Wt Readings from Last 1 Encounters:  07/17/22 50 kg  Ideal Body Weight:  48.2 kg  BMI:  Body mass index is 21.53 kg/m.  Estimated Nutritional Needs:   Kcal:  1600-1800  Protein:  80-95g  Fluid:  >/=1.6L  Wayne Stokes, RD, LDN, CNSC.

## 2022-07-18 NOTE — Evaluation (Signed)
Clinical/Bedside Swallow Evaluation Patient Details  Name: Wayne Stokes MRN: [id number] Date of Birth: 01/15/1966  Today's Date: 07/18/2022 Time: SLP Start Time (ACUTE ONLY): 1136 SLP Stop Time (ACUTE ONLY): 1200 SLP Time Calculation (min) (ACUTE ONLY): 24 min  Past Medical History:  Past Medical History:  Diagnosis Date   Diabetes mellitus without complication (HCC)    Hypertension    Past Surgical History:  Past Surgical History:  Procedure Laterality Date   IR KYPHO LUMBAR INC FX REDUCE BONE BX UNI/BIL CANNULATION INC/IMAGING  06/18/2022   IR LUMBAR DISC ASPIRATION W/IMG GUIDE  06/02/2022   HPI:  Wayne Stokes is an 56 y.o. male from [place], diabetes mellitus type 2, essential hypertension with lumbar disc disease with spinal stenosis, T12 compression fracture and mild cognitive impairment admitted for uncontrolled back pain and severe spinal lumbar stenosis orthopedic, neurosurgery evaluated the patient and recommended TLC L for pain control and outpatient follow-up.  Physical therapy recommended inpatient rehab difficult to place due to lack of insurance.  IR was consulted to perform kyphoplasty of T12 compression fracture on 06/18/2022. CT neck showed Moderate degenerative disc disease is noted at C4-5 and  C5-6.  Pt reprots difficulty swallowing, has severe weight loss.    Assessment / Plan / Recommendation  Clinical Impression  Pt reports difficulty swallowing. Through interpreter Wayne Stokes he reports he cannot swallow rice and also gets choked when he drinks water. He is able to tolerate soft soldis like a banana and applesauce well. When observed, pt has a gurgle sound, multiple swallows and a cough at times. Tolerates nectar thick liquids without such significant signs of dysphagia. Strongly suspect a form of cervical esopahgeal dysphagia that would account for significant weight loss. MBS needed for instrumental assessment of swallowing. In the meantime, puree diet and  nectar thick liquids are recommended. Pt educated through interpreter. When needed call [phone] to schedule in future sessions. SLP Visit Diagnosis: Dysphagia, unspecified (R13.10)    Aspiration Risk  Risk for inadequate nutrition/hydration    Diet Recommendation Dysphagia 1 (Puree);Nectar-thick liquid   Liquid Administration via: Cup;Straw Medication Administration: Crushed with puree Supervision: Patient able to self feed Compensations: Slow rate;Small sips/bites Postural Changes: Seated upright at 90 degrees    Other  Recommendations      Recommendations for follow up therapy are one component of a multi-disciplinary discharge planning process, led by the attending physician.  Recommendations may be updated based on patient status, additional functional criteria and insurance authorization.  Follow up Recommendations Skilled nursing-short term rehab (<3 hours/day)      Assistance Recommended at Discharge Intermittent Supervision/Assistance  Functional Status Assessment Patient has had a recent decline in their functional status and demonstrates the ability to make significant improvements in function in a reasonable and predictable amount of time.  Frequency and Duration    2 weeks       Prognosis        Swallow Study   General HPI: Wayne Stokes is an 56 y.o. male from [place], diabetes mellitus type 2, essential hypertension with lumbar disc disease with spinal stenosis, T12 compression fracture and mild cognitive impairment admitted for uncontrolled back pain and severe spinal lumbar stenosis orthopedic, neurosurgery evaluated the patient and recommended TLC L for pain control and outpatient follow-up.  Physical therapy recommended inpatient rehab difficult to place due to lack of insurance.  IR was consulted to perform kyphoplasty of T12 compression fracture on 06/18/2022. CT neck showed Moderate degenerative disc disease is  noted at C4-5 and  C5-6.  Pt  reprots difficulty swallowing, has severe weight loss. Type of Study: Bedside Swallow Evaluation Previous Swallow Assessment: none Diet Prior to this Study: Regular;Thin liquids Temperature Spikes Noted: No Respiratory Status: Room air History of Recent Intubation: No Behavior/Cognition: Alert;Cooperative;Pleasant mood Oral Cavity Assessment: Within Functional Limits Oral Care Completed by SLP: No Oral Cavity - Dentition: Adequate natural dentition Vision: Functional for self-feeding Self-Feeding Abilities: Able to feed self Patient Positioning: Upright in bed Baseline Vocal Quality: Normal Volitional Cough: Strong Volitional Swallow: Able to elicit    Oral/Motor/Sensory Function Overall Oral Motor/Sensory Function: Within functional limits   Ice Chips     Thin Liquid Thin Liquid: Impaired Presentation: Cup;Self Fed Pharyngeal  Phase Impairments: Cough - Immediate;Multiple swallows    Nectar Thick Nectar Thick Liquid: Within functional limits Presentation: Cup   Honey Thick Honey Thick Liquid: Not tested   Puree Puree: Within functional limits Presentation: Spoon   Solid     Solid: Impaired Presentation: Spoon Oral Phase Functional Implications: Prolonged oral transit Other Comments: Pt able to masticate a banana, cannot complete mastication of rice.      Wayne Stokes, Wayne Stokes 07/18/2022,12:14 PM

## 2022-07-18 NOTE — Plan of Care (Signed)
  Problem: Education: Goal: Knowledge of General Education information will improve Description: Including pain rating scale, medication(s)/side effects and non-pharmacologic comfort measures Outcome: Progressing   Problem: Health Behavior/Discharge Planning: Goal: Ability to manage health-related needs will improve Outcome: Progressing   Problem: Clinical Measurements: Goal: Ability to maintain clinical measurements within normal limits will improve Outcome: Progressing Goal: Diagnostic test results will improve Outcome: Progressing   Problem: Activity: Goal: Risk for activity intolerance will decrease Outcome: Progressing   Problem: Coping: Goal: Level of anxiety will decrease Outcome: Progressing   Problem: Elimination: Goal: Will not experience complications related to bowel motility Outcome: Progressing Goal: Will not experience complications related to urinary retention Outcome: Progressing   Problem: Pain Managment: Goal: General experience of comfort will improve Outcome: Progressing   Problem: Safety: Goal: Ability to remain free from injury will improve Outcome: Progressing   Problem: Skin Integrity: Goal: Risk for impaired skin integrity will decrease Outcome: Progressing   Problem: Education: Goal: Ability to describe self-care measures that may prevent or decrease complications (Diabetes Survival Skills Education) will improve Outcome: Progressing Goal: Individualized Educational Video(s) Outcome: Progressing   Problem: Coping: Goal: Ability to adjust to condition or change in health will improve Outcome: Progressing   Problem: Fluid Volume: Goal: Ability to maintain a balanced intake and output will improve Outcome: Progressing   Problem: Health Behavior/Discharge Planning: Goal: Ability to identify and utilize available resources and services will improve Outcome: Progressing Goal: Ability to manage health-related needs will improve Outcome:  Progressing   Problem: Metabolic: Goal: Ability to maintain appropriate glucose levels will improve Outcome: Progressing   Problem: Nutritional: Goal: Maintenance of adequate nutrition will improve Outcome: Progressing Goal: Progress toward achieving an optimal weight will improve Outcome: Progressing   Problem: Skin Integrity: Goal: Risk for impaired skin integrity will decrease Outcome: Progressing   Problem: Tissue Perfusion: Goal: Adequacy of tissue perfusion will improve Outcome: Progressing

## 2022-07-18 NOTE — TOC Progression Note (Signed)
Transition of Care [facility]) - Progression Note    Patient Details  Name: Wayne Stokes MRN: [id number] Date of Birth: 1966-01-19  [facility]) CM/SW Contact  Lorri Frederick, LCSW Phone Number: 07/18/2022, 3:01 PM  Clinical Narrative:  Message from [place].  Still waiting for admin approval to make bed offer.     Expected Discharge Plan: Skilled Nursing Facility Barriers to Discharge: Continued Medical Work up  Expected Discharge Plan and Services Expected Discharge Plan: Skilled Nursing Facility In-house Referral: Artist Discharge Planning Services: CM Consult                                           Social Determinants of Health (SDOH) Interventions    Readmission Risk Interventions     No data to display

## 2022-07-18 NOTE — Progress Notes (Signed)
PT Cancellation Note  Patient Details Name: Wayne Stokes MRN: [id number] DOB: 12-Oct-1965   Cancelled Treatment:    Reason Eval/Treat Not Completed: (P) Patient at procedure or test/unavailable (clinical staff in room to place cortrak, interpreter present to translate.) Will continue efforts per PT plan of care as schedule permits.   Jarren Para M Klynn Linnemann 07/18/2022, 2:15 PM

## 2022-07-18 NOTE — Progress Notes (Addendum)
[facility] PROGRESS NOTE    Progress Note  Wayne Stokes  TXM:[id number] DOB: 1965/09/26 DOA: 05/26/2022 PCP: [name], NP     Brief Narrative:   Wayne Stokes is an 56 y.o. male from [place], diabetes mellitus type 2, essential hypertension with lumbar disc disease with spinal stenosis, T12 compression fracture and mild cognitive impairment admitted for uncontrolled back pain and severe spinal lumbar stenosis orthopedic, neurosurgery evaluated the patient and recommended TLC L for pain control and outpatient follow-up.  Physical therapy recommended inpatient rehab difficult to place due to lack of insurance. IR was consulted to perform kyphoplasty of T12 compression fracture on 06/18/2022 Stay complicated by weight loss and poor Po appetite. awaiting placement to skilled nursing facility.   Assessment/Plan:   Severe back pain/lumbar spinal stenosis and T12 compression fraction: MRI did not show any cord injury neurosurgery was consulted recommended no surgical intervention the recommended TLCO. IR was consulted to perform kyphoplasty on 06/18/2022. Physical therapy evaluated the patient recommended skilled nursing facility is a challenge because he does not have insurance.  ? Thrush -added nystatin swish and swallow  Poor Po intake -added remeron -SLP eval -treat thrush -will use cortract over the weekend until MBS can be done  Elevated calcium -- last 2 lab checks -gentle IVF and recheck in AM -further work up if still elevated   Leukocytosis/fever: Of unclear source now resolved. He was initially started on IV vancomycin and Rocephin Work-up was unrevealing, there is likely reactive due to T12 compression fracture MRI was done of the shoulder there was question septic arthritis ,IR aspirated again on 06/06/2022 that showed no signs of infection.  ID was consulted and antibiotics were discontinued.  Chronic right shoulder pain: Continue oral  narcotics follow-up with orthopedic surgery as an outpatient. Was complaining of right wrist pain MRI showed inflammatory arthropathy no discrete drainable abscess. Has remained afebrile he relates his pain is controlled.  Abdominal discomfort/microcytic anemia/iron deficiency anemia: CT scan of the abdomen pelvis did not show any acute findings. Hemoglobin has remained relatively stable.  Acute urinary retention/acute kidney injury: Felt to be secondary to BPH started on Flomax renal function stabilized after Foley was placed, has failed voiding trial 3 times. We will started on Proscar will need to follow-up with urology as an outpatient.  Constipation: - MiraLAX p.o. twice daily. -last BM 11/8  Microcytic anemia: Platelet count 437, check an anemia panel, he is already on ferrous sulfate    Hyponatremia: Likely chronic.  Hypokalemia/hypomagnesemia: Replete orally now resolved continue to monitor intermittently.  Sinus tachycardia: Resolved with metoprolol.  Diabetes mellitus type 2: Currently on sliding scale insulin has not required insulin for over 5 days. Stop checking CBGs. -diet controlled  Essential hypertension: Well-controlled on metoprolol.  Hyperlipidemia: Continue statins.    DVT prophylaxis: lovenox Family Communication: at bedside (interpreter never showed) Status is: Inpatient Remains inpatient appropriate because: Awaiting skilled nursing select placement.    Code Status: full          Medical Consultants:   ID IR   Subjective:   C/o texture of rice, feeling nauseous when swallowing  Objective:    Vitals:   07/17/22 0700 07/17/22 1422 07/17/22 1929 07/18/22 0814  BP: 116/89 (!) 116/95 (!) 115/93 96/74  Pulse: 80 92 77 79  Resp: 18 18 16    Temp: 98.5 F (36.9 C) 97.9 F (36.6 C) 98.4 F (36.9 C) (!) 97.5 F (36.4 C)  TempSrc: Oral Oral Oral Oral  SpO2:  98% 100% 100% 93%  Weight:      Height:       SpO2: 93 % O2  Flow Rate (L/min): 2 L/min   Intake/Output Summary (Last 24 hours) at 07/18/2022 1136 Last data filed at 07/18/2022 0300 Gross per 24 hour  Intake 100 ml  Output 350 ml  Net -250 ml   Filed Weights   07/02/22 0500 07/16/22 0500 07/17/22 0500  Weight: 53.7 kg 50.3 kg 50 kg    Exam:   General: Appearance:    Well developed, well nourished male in no acute distress- seen at bedside with interpreter     Lungs:     Clear to auscultation bilaterally, respirations unlabored  Heart:    Normal heart rate.   MS:   All extremities are intact.   Neurologic:   Awake, alert        Data Reviewed:    Labs: Basic Metabolic Panel: Recent Labs  Lab 07/18/22 0338  NA 135  K 3.2*  CL 98  CO2 29  GLUCOSE 73  BUN 9  CREATININE 1.03  CALCIUM 11.9*   GFR Estimated Creatinine Clearance: 56 mL/min (by C-G formula based on SCr of 1.03 mg/dL). Liver Function Tests: No results for input(s): "AST", "ALT", "ALKPHOS", "BILITOT", "PROT", "ALBUMIN" in the last 168 hours.  No results for input(s): "LIPASE", "AMYLASE" in the last 168 hours. No results for input(s): "AMMONIA" in the last 168 hours. Coagulation profile No results for input(s): "INR", "PROTIME" in the last 168 hours. COVID-19 Labs  No results for input(s): "DDIMER", "FERRITIN", "LDH", "CRP" in the last 72 hours.   Lab Results  Component Value Date   SARSCOV2NAA NEGATIVE 06/01/2022   SARSCOV2NAA NEGATIVE 05/07/2022    CBC: Recent Labs  Lab 07/18/22 0338  WBC 5.7  HGB 8.8*  HCT 27.0*  MCV 68.9*  PLT 402*   Cardiac Enzymes: No results for input(s): "CKTOTAL", "CKMB", "CKMBINDEX", "TROPONINI" in the last 168 hours. BNP (last 3 results) No results for input(s): "PROBNP" in the last 8760 hours. CBG: No results for input(s): "GLUCAP" in the last 168 hours.  D-Dimer: No results for input(s): "DDIMER" in the last 72 hours. Hgb A1c: No results for input(s): "HGBA1C" in the last 72 hours.  Lipid Profile: No  results for input(s): "CHOL", "HDL", "LDLCALC", "TRIG", "CHOLHDL", "LDLDIRECT" in the last 72 hours. Thyroid function studies: No results for input(s): "TSH", "T4TOTAL", "T3FREE", "THYROIDAB" in the last 72 hours.  Invalid input(s): "FREET3" Anemia work up: No results for input(s): "VITAMINB12", "FOLATE", "FERRITIN", "TIBC", "IRON", "RETICCTPCT" in the last 72 hours.  Sepsis Labs: Recent Labs  Lab 07/18/22 0338  WBC 5.7   Microbiology No results found for this or any previous visit (from the past 240 hour(s)).   Medications:    atorvastatin  40 mg Oral Daily   Chlorhexidine Gluconate Cloth  6 each Topical Daily   cholecalciferol  1,000 Units Oral Daily   colchicine  0.3 mg Oral BID   donepezil  5 mg Oral QHS   feeding supplement  1 Container Oral TID BM   feeding supplement  237 mL Oral BID BM   ferrous sulfate  325 mg Oral Q breakfast   finasteride  5 mg Oral Daily   gabapentin  200 mg Oral TID   lidocaine  1 patch Transdermal Q24H   lidocaine  1 patch Transdermal Q24H   magnesium gluconate  500 mg Oral BID   methocarbamol  500 mg Oral TID   metoprolol succinate  25 mg Oral Daily   mirtazapine  15 mg Oral QHS   morphine  15 mg Oral Q12H   nystatin  5 mL Oral QID   ondansetron (ZOFRAN) IV  4 mg Intravenous Q6H   pantoprazole  40 mg Oral Daily   polyethylene glycol  17 g Oral BID   senna-docusate  1 tablet Oral BID   tamsulosin  0.4 mg Oral QPC supper   Continuous Infusions:     LOS: 53 days   Joseph Art  [facility]  07/18/2022, 11:36 AM

## 2022-07-19 DIAGNOSIS — E43 Unspecified severe protein-calorie malnutrition: Secondary | ICD-10-CM | POA: Insufficient documentation

## 2022-07-19 LAB — CBC
HCT: 27.3 % — ABNORMAL LOW (ref 39.0–52.0)
Hemoglobin: 9 g/dL — ABNORMAL LOW (ref 13.0–17.0)
MCH: 22.8 pg — ABNORMAL LOW (ref 26.0–34.0)
MCHC: 33 g/dL (ref 30.0–36.0)
MCV: 69.3 fL — ABNORMAL LOW (ref 80.0–100.0)
Platelets: 377 10*3/uL (ref 150–400)
RBC: 3.94 MIL/uL — ABNORMAL LOW (ref 4.22–5.81)
RDW: 23 % — ABNORMAL HIGH (ref 11.5–15.5)
WBC: 5.5 10*3/uL (ref 4.0–10.5)
nRBC: 0 % (ref 0.0–0.2)

## 2022-07-19 LAB — BASIC METABOLIC PANEL
Anion gap: 9 (ref 5–15)
BUN: 7 mg/dL (ref 6–20)
CO2: 27 mmol/L (ref 22–32)
Calcium: 11 mg/dL — ABNORMAL HIGH (ref 8.9–10.3)
Chloride: 97 mmol/L — ABNORMAL LOW (ref 98–111)
Creatinine, Ser: 0.96 mg/dL (ref 0.61–1.24)
GFR, Estimated: >60 mL/min (ref >60)
Glucose, Bld: 104 mg/dL — ABNORMAL HIGH (ref 70–99)
Potassium: 3.5 mmol/L (ref 3.5–5.1)
Sodium: 133 mmol/L — ABNORMAL LOW (ref 135–145)

## 2022-07-19 LAB — MAGNESIUM
Magnesium: 1.3 mg/dL — ABNORMAL LOW (ref 1.7–2.4)
Magnesium: 3 mg/dL — ABNORMAL HIGH (ref 1.7–2.4)

## 2022-07-19 LAB — PHOSPHORUS
Phosphorus: 2.4 mg/dL — ABNORMAL LOW (ref 2.5–4.6)
Phosphorus: 2 mg/dL — ABNORMAL LOW (ref 2.5–4.6)

## 2022-07-19 LAB — GLUCOSE, CAPILLARY
Glucose-Capillary: 101 mg/dL — ABNORMAL HIGH (ref 70–99)
Glucose-Capillary: 104 mg/dL — ABNORMAL HIGH (ref 70–99)
Glucose-Capillary: 123 mg/dL — ABNORMAL HIGH (ref 70–99)
Glucose-Capillary: 84 mg/dL (ref 70–99)
Glucose-Capillary: 96 mg/dL (ref 70–99)
Glucose-Capillary: 97 mg/dL (ref 70–99)

## 2022-07-19 MED ORDER — MAGNESIUM SULFATE 4 GM/100ML IV SOLN
4.0000 g | Freq: Once | INTRAVENOUS | Status: AC
  Administered 2022-07-19: 4 g via INTRAVENOUS
  Filled 2022-07-19: qty 100

## 2022-07-19 MED ORDER — POTASSIUM CHLORIDE CRYS ER 20 MEQ PO TBCR: 40.0000 meq | EXTENDED_RELEASE_TABLET | ORAL | Status: DC

## 2022-07-19 MED ORDER — POTASSIUM CHLORIDE IN NACL 20-0.9 MEQ/L-% IV SOLN
INTRAVENOUS | Status: DC
  Filled 2022-07-19 (×5): qty 1000

## 2022-07-19 MED ORDER — POTASSIUM CHLORIDE CRYS ER 20 MEQ PO TBCR
40.0000 meq | EXTENDED_RELEASE_TABLET | Freq: Once | ORAL | Status: AC
  Administered 2022-07-19: 40 meq via ORAL
  Filled 2022-07-19: qty 2

## 2022-07-19 NOTE — Progress Notes (Signed)
[facility] PROGRESS NOTE    Progress Note  Wayne Stokes  ZOX:[id number] DOB: [birth year] DOA: 05/26/2022 PCP: [name], NP     Brief Narrative:   Wayne Stokes is an 56 y.o. male from [place], diabetes mellitus type 2, essential hypertension with lumbar disc disease with spinal stenosis, T12 compression fracture and mild cognitive impairment admitted for uncontrolled back pain and severe spinal lumbar stenosis orthopedic, neurosurgery evaluated the patient and recommended TLC L for pain control and outpatient follow-up.  Physical therapy recommended inpatient rehab difficult to place due to lack of insurance. IR was consulted to perform kyphoplasty of T12 compression fracture on 06/18/2022 Stay complicated by weight loss and poor Po appetite. awaiting placement to skilled nursing facility.  07/19/2022: Patient is resting quietly.  Abnormal electrolytes (magnesium and potassium) repleted.  We will continue to monitor electrolytes.     Assessment/Plan:   Severe back pain/lumbar spinal stenosis and T12 compression fraction: MRI did not show any cord injury neurosurgery was consulted recommended no surgical intervention the recommended TLCO. IR was consulted to perform kyphoplasty on 06/18/2022. Physical therapy evaluated the patient recommended skilled nursing facility is a challenge because he does not have insurance. 07/19/2022: Resting quietly.  Challenging disposition.  ? Thrush -added nystatin swish and swallow  Poor Po intake -added remeron -SLP eval -treat thrush -will use cortract over the weekend until [facility] can be done  Elevated calcium -- last 2 lab checks -gentle IVF and recheck in AM -further work up if still elevated 07/19/2022: Check ionized calcium in the morning.  Albumin is 1.5.  Calcium is 11 today.  Hypomagnesemia: -Magnesium is 1.3 today. -IV magnesium 4 g x 1 dose. -Check magnesium level in the morning.  Hypokalemia: -Potassium  is 3.5. -Continue to monitor and replete.  Hypophosphatemia: -Phosphorus is 2.4. -Supportive nutrition. -Continue to monitor.   Leukocytosis/fever: Of unclear source now resolved. He was initially started on IV vancomycin and Rocephin Work-up was unrevealing, there is likely reactive due to T12 compression fracture MRI was done of the shoulder there was question septic arthritis ,IR aspirated again on 06/06/2022 that showed no signs of infection.  ID was consulted and antibiotics were discontinued. 07/19/2022: Fever has resolved.  Leukocytosis is resolved.  Chronic right shoulder pain: Continue oral narcotics follow-up with orthopedic surgery as an outpatient. Was complaining of right wrist pain MRI showed inflammatory arthropathy no discrete drainable abscess. Has remained afebrile he relates his pain is controlled.  Abdominal discomfort/microcytic anemia/iron deficiency anemia: CT scan of the abdomen pelvis did not show any acute findings. Hemoglobin has remained relatively stable.  Acute urinary retention/acute kidney injury: Felt to be secondary to BPH started on Flomax renal function stabilized after Foley was placed, has failed voiding trial 3 times. We will started on Proscar will need to follow-up with urology as an outpatient.  Constipation: - MiraLAX p.o. twice daily. -last BM 11/8  Microcytic anemia: Platelet count 437, check an anemia panel, he is already on ferrous sulfate    Hyponatremia: Likely chronic.  Hypokalemia/hypomagnesemia: Replete orally now resolved continue to monitor intermittently.  Sinus tachycardia: Resolved with metoprolol.  Diabetes mellitus type 2: Currently on sliding scale insulin has not required insulin for over 5 days. Stop checking CBGs. -diet controlled  Essential hypertension: Well-controlled on metoprolol.  Hyperlipidemia: Continue statins.    DVT prophylaxis: lovenox Family Communication: at bedside (interpreter  never showed) Status is: Inpatient Remains inpatient appropriate because: Awaiting skilled nursing select placement.    Code Status:  full          Medical Consultants:   ID IR   Subjective:   C/o texture of rice, feeling nauseous when swallowing  Objective:    Vitals:   07/18/22 1447 07/18/22 2129 07/19/22 0435 07/19/22 0500  BP: 113/72 125/79 (!) 118/97   Pulse: 87 83 83   Resp: 17 14 16    Temp: 97.6 F (36.4 C) 97.8 F (36.6 C) 98.2 F (36.8 C)   TempSrc: Oral Oral Oral   SpO2: 93% 94% 100%   Weight:    51.2 kg  Height:       SpO2: 100 % O2 Flow Rate (L/min): 2 L/min   Intake/Output Summary (Last 24 hours) at 07/19/2022 0812 Last data filed at 07/19/2022 0711 Gross per 24 hour  Intake 1354.33 ml  Output 950 ml  Net 404.33 ml    Filed Weights   07/16/22 0500 07/17/22 0500 07/19/22 0500  Weight: 50.3 kg 50 kg 51.2 kg    Exam:   General: Appearance:  Resting quietly.        Lungs:     Clear to auscultation bilaterally, respirations unlabored  Heart:    Normal heart rate.   MS:   All extremities are intact.   Neurologic:           Data Reviewed:    Labs: Basic Metabolic Panel: Recent Labs  Lab 07/18/22 0338 07/18/22 1632 07/19/22 0154  NA 135  --  133*  K 3.2*  --  3.5  CL 98  --  97*  CO2 29  --  27  GLUCOSE 73  --  104*  BUN 9  --  7  CREATININE 1.03  --  0.96  CALCIUM 11.9*  --  11.0*  MG  --  1.4* 1.3*  PHOS  --  2.6 2.4*    GFR Estimated Creatinine Clearance: 60 mL/min (by C-G formula based on SCr of 0.96 mg/dL). Liver Function Tests: No results for input(s): "AST", "ALT", "ALKPHOS", "BILITOT", "PROT", "ALBUMIN" in the last 168 hours.  No results for input(s): "LIPASE", "AMYLASE" in the last 168 hours. No results for input(s): "AMMONIA" in the last 168 hours. Coagulation profile No results for input(s): "INR", "PROTIME" in the last 168 hours. COVID-19 Labs  No results for input(s): "DDIMER", "FERRITIN",  "LDH", "CRP" in the last 72 hours.   Lab Results  Component Value Date   SARSCOV2NAA NEGATIVE 06/01/2022   SARSCOV2NAA NEGATIVE 05/07/2022    CBC: Recent Labs  Lab 07/18/22 0338 07/19/22 0154  WBC 5.7 5.5  HGB 8.8* 9.0*  HCT 27.0* 27.3*  MCV 68.9* 69.3*  PLT 402* 377    Cardiac Enzymes: No results for input(s): "CKTOTAL", "CKMB", "CKMBINDEX", "TROPONINI" in the last 168 hours. BNP (last 3 results) No results for input(s): "PROBNP" in the last 8760 hours. CBG: Recent Labs  Lab 07/19/22 0030 07/19/22 0436  GLUCAP 97 84    D-Dimer: No results for input(s): "DDIMER" in the last 72 hours. Hgb A1c: No results for input(s): "HGBA1C" in the last 72 hours.  Lipid Profile: No results for input(s): "CHOL", "HDL", "LDLCALC", "TRIG", "CHOLHDL", "LDLDIRECT" in the last 72 hours. Thyroid function studies: No results for input(s): "TSH", "T4TOTAL", "T3FREE", "THYROIDAB" in the last 72 hours.  Invalid input(s): "FREET3" Anemia work up: No results for input(s): "VITAMINB12", "FOLATE", "FERRITIN", "TIBC", "IRON", "RETICCTPCT" in the last 72 hours.  Sepsis Labs: Recent Labs  Lab 07/18/22 0338 07/19/22 0154  WBC 5.7 5.5    Microbiology  No results found for this or any previous visit (from the past 240 hour(s)).   Medications:    atorvastatin  40 mg Oral Daily   Chlorhexidine Gluconate Cloth  6 each Topical Daily   colchicine  0.3 mg Oral BID   donepezil  5 mg Oral QHS   feeding supplement  1 Container Oral TID BM   feeding supplement  237 mL Oral BID BM   ferrous sulfate  325 mg Oral Q breakfast   finasteride  5 mg Oral Daily   gabapentin  200 mg Oral TID   lidocaine  1 patch Transdermal Q24H   lidocaine  1 patch Transdermal Q24H   magnesium gluconate  500 mg Oral BID   methocarbamol  500 mg Oral TID   metoprolol succinate  25 mg Oral Daily   mirtazapine  15 mg Oral QHS   morphine  15 mg Oral Q12H   nystatin  5 mL Oral QID   ondansetron (ZOFRAN) IV  4 mg  Intravenous Q6H   pantoprazole  40 mg Oral Daily   polyethylene glycol  17 g Oral BID   senna-docusate  1 tablet Oral BID   tamsulosin  0.4 mg Oral QPC supper   thiamine (VITAMIN B1) injection  100 mg Intravenous Daily   Continuous Infusions:  sodium chloride 100 mL/hr at 07/19/22 0019   feeding supplement (OSMOLITE 1.5 CAL) 1,000 mL (07/18/22 1709)       LOS: 54 days   Time spent: 55 minutes.  Barnetta Chapel, MD.  [facility]  07/19/2022, 8:12 AM

## 2022-07-19 NOTE — Plan of Care (Signed)
  Problem: Clinical Measurements: Goal: Ability to maintain clinical measurements within normal limits will improve Outcome: Progressing   Problem: Activity: Goal: Risk for activity intolerance will decrease Outcome: Progressing   Problem: Skin Integrity: Goal: Risk for impaired skin integrity will decrease Outcome: Progressing   Problem: Fluid Volume: Goal: Ability to maintain a balanced intake and output will improve Outcome: Progressing

## 2022-07-19 NOTE — Progress Notes (Signed)
Feeding rate has been increased to 15 ml/hr at 5:17 a.m. Rate increase every 12 hours  till goal of 50 ml/hr is met.

## 2022-07-20 ENCOUNTER — Inpatient Hospital Stay (HOSPITAL_COMMUNITY): Payer: Medicaid Other

## 2022-07-20 LAB — RENAL FUNCTION PANEL
Albumin: <1.5 g/dL — ABNORMAL LOW (ref 3.5–5.0)
Anion gap: 4 — ABNORMAL LOW (ref 5–15)
BUN: 6 mg/dL (ref 6–20)
CO2: 25 mmol/L (ref 22–32)
Calcium: 10.2 mg/dL (ref 8.9–10.3)
Chloride: 107 mmol/L (ref 98–111)
Creatinine, Ser: 0.91 mg/dL (ref 0.61–1.24)
GFR, Estimated: >60 mL/min (ref >60)
Glucose, Bld: 121 mg/dL — ABNORMAL HIGH (ref 70–99)
Phosphorus: 1.7 mg/dL — ABNORMAL LOW (ref 2.5–4.6)
Potassium: 4.1 mmol/L (ref 3.5–5.1)
Sodium: 136 mmol/L (ref 135–145)

## 2022-07-20 LAB — GLUCOSE, CAPILLARY
Glucose-Capillary: 100 mg/dL — ABNORMAL HIGH (ref 70–99)
Glucose-Capillary: 101 mg/dL — ABNORMAL HIGH (ref 70–99)
Glucose-Capillary: 117 mg/dL — ABNORMAL HIGH (ref 70–99)
Glucose-Capillary: 119 mg/dL — ABNORMAL HIGH (ref 70–99)
Glucose-Capillary: 120 mg/dL — ABNORMAL HIGH (ref 70–99)
Glucose-Capillary: 125 mg/dL — ABNORMAL HIGH (ref 70–99)
Glucose-Capillary: 132 mg/dL — ABNORMAL HIGH (ref 70–99)

## 2022-07-20 LAB — MAGNESIUM: Magnesium: 1.9 mg/dL (ref 1.7–2.4)

## 2022-07-20 MED ORDER — SENNOSIDES 8.8 MG/5ML PO SYRP
5.0000 mL | ORAL_SOLUTION | Freq: Two times a day (BID) | ORAL | Status: DC
  Administered 2022-07-20 – 2022-07-26 (×11): 5 mL
  Filled 2022-07-20 (×15): qty 5

## 2022-07-20 MED ORDER — ACETAMINOPHEN 325 MG PO TABS
650.0000 mg | ORAL_TABLET | Freq: Four times a day (QID) | ORAL | Status: DC | PRN
  Administered 2022-07-24 – 2022-07-30 (×4): 650 mg
  Filled 2022-07-20 (×4): qty 2

## 2022-07-20 MED ORDER — DONEPEZIL HCL 5 MG PO TABS
5.0000 mg | ORAL_TABLET | Freq: Every day | ORAL | Status: DC
  Administered 2022-07-20 – 2022-07-31 (×11): 5 mg
  Filled 2022-07-20 (×12): qty 1

## 2022-07-20 MED ORDER — FAMOTIDINE 20 MG PO TABS
40.0000 mg | ORAL_TABLET | Freq: Every day | ORAL | Status: DC
  Administered 2022-07-20 – 2022-07-27 (×8): 40 mg
  Filled 2022-07-20 (×8): qty 2

## 2022-07-20 MED ORDER — POLYETHYLENE GLYCOL 3350 17 G PO PACK
17.0000 g | PACK | Freq: Two times a day (BID) | ORAL | Status: DC
  Administered 2022-07-20 – 2022-07-26 (×10): 17 g
  Filled 2022-07-20 (×12): qty 1

## 2022-07-20 MED ORDER — ATORVASTATIN CALCIUM 40 MG PO TABS
40.0000 mg | ORAL_TABLET | Freq: Every day | ORAL | Status: DC
  Administered 2022-07-21 – 2022-08-01 (×12): 40 mg
  Filled 2022-07-20 (×12): qty 1

## 2022-07-20 MED ORDER — COLCHICINE 0.3 MG HALF TABLET
0.3000 mg | ORAL_TABLET | Freq: Two times a day (BID) | ORAL | Status: DC
  Administered 2022-07-20 – 2022-07-27 (×14): 0.3 mg
  Filled 2022-07-20 (×15): qty 1

## 2022-07-20 MED ORDER — MIRTAZAPINE 15 MG PO TBDP
15.0000 mg | ORAL_TABLET | Freq: Every day | ORAL | Status: DC
  Administered 2022-07-20 – 2022-07-31 (×11): 15 mg
  Filled 2022-07-20 (×13): qty 1

## 2022-07-20 MED ORDER — K PHOS MONO-SOD PHOS DI & MONO 155-852-130 MG PO TABS
500.0000 mg | ORAL_TABLET | Freq: Two times a day (BID) | ORAL | Status: AC
  Administered 2022-07-20 – 2022-07-22 (×4): 500 mg via ORAL
  Filled 2022-07-20 (×4): qty 2

## 2022-07-20 MED ORDER — METHOCARBAMOL 500 MG PO TABS
500.0000 mg | ORAL_TABLET | Freq: Three times a day (TID) | ORAL | Status: DC
  Administered 2022-07-20 – 2022-07-25 (×16): 500 mg
  Filled 2022-07-20 (×17): qty 1

## 2022-07-20 MED ORDER — TRAMADOL HCL 50 MG PO TABS
50.0000 mg | ORAL_TABLET | Freq: Four times a day (QID) | ORAL | Status: DC | PRN
  Administered 2022-07-21 – 2022-07-28 (×3): 50 mg
  Filled 2022-07-20 (×4): qty 1

## 2022-07-20 MED ORDER — GABAPENTIN 250 MG/5ML PO SOLN
200.0000 mg | Freq: Three times a day (TID) | ORAL | Status: DC
  Administered 2022-07-20 – 2022-08-01 (×34): 200 mg
  Filled 2022-07-20 (×40): qty 4

## 2022-07-20 MED ORDER — FERROUS SULFATE 300 (60 FE) MG/5ML PO SOLN
300.0000 mg | Freq: Every day | ORAL | Status: DC
  Administered 2022-07-20 – 2022-08-01 (×13): 300 mg
  Filled 2022-07-20 (×14): qty 5

## 2022-07-20 MED ORDER — ONDANSETRON HCL 4 MG PO TABS
4.0000 mg | ORAL_TABLET | Freq: Four times a day (QID) | ORAL | Status: DC | PRN
  Administered 2022-07-24 – 2022-07-30 (×4): 4 mg
  Filled 2022-07-20 (×4): qty 1

## 2022-07-20 MED ORDER — SIMETHICONE 40 MG/0.6ML PO SUSP: 80.0000 mg | Freq: Four times a day (QID) | ORAL | Status: DC | PRN

## 2022-07-20 MED ORDER — DOCUSATE SODIUM 50 MG/5ML PO LIQD
100.0000 mg | Freq: Two times a day (BID) | ORAL | Status: DC
  Administered 2022-07-20 – 2022-07-26 (×11): 100 mg
  Filled 2022-07-20 (×12): qty 10

## 2022-07-20 MED ORDER — ENSURE ENLIVE PO LIQD
237.0000 mL | Freq: Two times a day (BID) | ORAL | Status: DC
  Administered 2022-07-20 – 2022-07-29 (×12): 237 mL
  Filled 2022-07-20: qty 237

## 2022-07-20 MED ORDER — MAGNESIUM GLUCONATE 500 MG PO TABS
500.0000 mg | ORAL_TABLET | Freq: Two times a day (BID) | ORAL | Status: DC
  Administered 2022-07-20 – 2022-08-01 (×24): 500 mg
  Filled 2022-07-20 (×25): qty 1

## 2022-07-20 NOTE — Progress Notes (Addendum)
Modified Barium Swallow Progress Note  Patient Details  Name: Wayne Stokes MRN: [id number] Date of Birth: 08-24-1966  Today's Date: 07/20/2022  Modified Barium Swallow completed.  Full report located under Chart Review in the Imaging Section.  Brief recommendations include the following:  Clinical Impression  SLP contacted [name] to coordinate the timing of the study with him, but no response was received and no response was received to [name] voice mail. The study was therefore completed without the use of the interpreter and changes to the pt's diet will be deferred until explanation can be provided with use of interpretation. Pt presents with oropharyngeal dysphagia characterized by weak bolus manipulation, impaired posterior propulsion, reduced bolus cohesion, a pharyngeal delay, and reduced anterior laryngeal movement. He demonstrated difficulty sucking from a straw, premature spillage to the valleculae, vallecular residue, pyriform sinus residue, and the swallow was often triggered with most of the liquid boluses at the level of the pyriform sinuses. Penetration (PAS 3) was noted with thin liquids after the swallow due to phrayngeal residue, and spillover of residue in the pyriform sinuses. Penetration was eliminated with individual boluses of thin liquids via cup and with a chin tuck posture which was facilitated with use of a straw. Visualization of pt's esophagus revealed stasis of barium throughout the thoracic esophagus (see below) which did not clear with time liquid boluses. The etiology of this cannot be determined with this study; SLP therefore recommends esophageal assessment and/or GI consult; Dr. Dartha Lodge advised of results and recommendations. Pt's current diet will be continued until there can be further communication with the pt, but SLP anticipates that advancement to thin liquids is likely if precautions can be observed, and that, pending assessment/management of pt's  esophageal dysfunction, solid advancement may be possible.    Swallow Evaluation Recommendations       SLP Diet Recommendations: Dysphagia 1 (Puree) solids;Nectar thick liquid   Liquid Administration via: Cup;Straw   Medication Administration: Crushed with puree   Supervision: Patient able to self feed   Compensations: Slow rate;Small sips/bites   Postural Changes: Seated upright at 90 degrees   Oral Care Recommendations: Oral care BID      Brooke Payes I. Vear Clock, MS, CCC-SLP Acute Rehabilitation Services Office number [phone]  Scheryl Marten 07/20/2022,2:43 PM

## 2022-07-20 NOTE — TOC Progression Note (Signed)
Transition of Care [facility]) - Progression Note    Patient Details  Name: Wayne Stokes MRN: [id number] Date of Birth: May 26, 1966  Transition of Care [facility]) CM/SW Contact  Fatoumata Albaugh [place], [place] Phone Number: 07/20/2022, 9:13 AM  Clinical Narrative:    Phone call to [facility], left message with Kristal-admissions Director requesting update on admin approval to make bed offer.  Jahleel Stroschein, LCSW Transition of Care    Expected Discharge Plan: Skilled Nursing Facility Barriers to Discharge: Continued Medical Work up  Expected Discharge Plan and Services Expected Discharge Plan: Skilled Nursing Facility In-house Referral: Artist Discharge Planning Services: CM Consult                                           Social Determinants of Health (SDOH) Interventions    Readmission Risk Interventions     No data to display

## 2022-07-20 NOTE — Progress Notes (Signed)
[facility] PROGRESS NOTE    Progress Note  Gad Aymond  [id number]:[id number] DOB: [birth year] DOA: 05/26/2022 PCP: [name], NP     Brief Narrative:   Wayne Stokes is an 56 y.o. male from [place] speaking, diabetes mellitus type 2, essential hypertension with lumbar disc disease with spinal stenosis, T12 compression fracture and mild cognitive impairment admitted for uncontrolled back pain and severe spinal lumbar stenosis orthopedic, neurosurgery evaluated the patient and recommended TLC L for pain control and outpatient follow-up.  Physical therapy recommended inpatient rehab difficult to place due to lack of insurance. IR was consulted to perform kyphoplasty of T12 compression fracture on 06/18/2022 Stay complicated by weight loss and poor Po appetite. awaiting placement to skilled nursing facility.  07/19/2022: Patient is resting quietly.  Abnormal electrolytes (magnesium and potassium) repleted.  We will continue to monitor electrolytes.   07/20/2022: Patient seen.  Patient remained stable.  Potassium is 4.1 today.  Magnesium is 1.9 today.  Modified barium swallow done today, dysphagia 1 (pure) diet with nectar thick liquid recommended.   Assessment/Plan:   Severe back pain/lumbar spinal stenosis and T12 compression fraction: MRI did not show any cord injury neurosurgery was consulted recommended no surgical intervention the recommended TLCO. IR was consulted to perform kyphoplasty on 06/18/2022. Physical therapy evaluated the patient recommended skilled nursing facility is a challenge because he does not have insurance. 07/20/2022: Resting quietly.  Challenging disposition.   Poor Po intake -added remeron -SLP eval -treat thrush -will use cortract over the weekend until MBS can be done 09/19/2021: See above documentation.  Elevated calcium -- last 2 lab checks -gentle IVF and recheck in AM -further work up if still elevated 07/19/2022: Check ionized calcium in  the morning.  Albumin is 1.5.  Calcium is 11 today. 07/20/2022: Calcium is down to 10.2 with hydration.  Hypomagnesemia: -Magnesium is 1.3 today. -IV magnesium 4 g x 1 dose. -Check magnesium level in the morning. 07/20/2022: Magnesium is 1.9 today.  Hypokalemia: -Potassium is 4.1 today. -Continue to monitor and replete.  Hypophosphatemia: -Phosphorus is 2.4. -Supportive nutrition. -Continue to monitor and replete.   Leukocytosis/fever: Of unclear source now resolved. He was initially started on IV vancomycin and Rocephin Work-up was unrevealing, there is likely reactive due to T12 compression fracture MRI was done of the shoulder there was question septic arthritis ,IR aspirated again on 06/06/2022 that showed no signs of infection.  ID was consulted and antibiotics were discontinued. 07/19/2022: Fever has resolved.  Leukocytosis is resolved.  Chronic right shoulder pain: Continue oral narcotics follow-up with orthopedic surgery as an outpatient. Was complaining of right wrist pain MRI showed inflammatory arthropathy no discrete drainable abscess. Has remained afebrile he relates his pain is controlled.  Abdominal discomfort/microcytic anemia/iron deficiency anemia: CT scan of the abdomen pelvis did not show any acute findings. Hemoglobin has remained relatively stable.  Acute urinary retention/acute kidney injury: Felt to be secondary to BPH started on Flomax renal function stabilized after Foley was placed, has failed voiding trial 3 times. We will started on Proscar will need to follow-up with urology as an outpatient.  Constipation: - MiraLAX p.o. twice daily. -last BM 11/8  Microcytic anemia: Platelet count 437, check an anemia panel, he is already on ferrous sulfate   Hyponatremia: Volume related. -Resolved with hydration. -Sodium is 136 today.  Sinus tachycardia: Resolved with metoprolol.  Diabetes mellitus type 2: Currently on sliding scale insulin has  not required insulin for over 5 days. Stop checking CBGs. -diet  controlled  Essential hypertension: Well-controlled on metoprolol.  Hyperlipidemia: Continue statins.    DVT prophylaxis: lovenox Family Communication: at bedside (interpreter never showed) Status is: Inpatient Remains inpatient appropriate because: Awaiting skilled nursing select placement.    Code Status: full          Medical Consultants:   ID IR   Subjective:   No new complaints.  Objective:    Vitals:   07/19/22 1935 07/20/22 0505 07/20/22 1040 07/20/22 1400  BP: 106/79 102/87 106/84 95/82  Pulse: 77 90 93 98  Resp: 18 17 16 19   Temp: 98.4 F (36.9 C) 98.2 F (36.8 C) 100.1 F (37.8 C) 97.9 F (36.6 C)  TempSrc: Oral Oral Oral   SpO2: 95% 97% 94% 97%  Weight:      Height:       SpO2: 97 % O2 Flow Rate (L/min): 2 L/min   Intake/Output Summary (Last 24 hours) at 07/20/2022 1705 Last data filed at 07/20/2022 0503 Gross per 24 hour  Intake --  Output 900 ml  Net -900 ml    Filed Weights   07/16/22 0500 07/17/22 0500 07/19/22 0500  Weight: 50.3 kg 50 kg 51.2 kg    Exam:   General: Appearance:  Resting quietly.        Lungs:     Clear to auscultation bilaterally, respirations unlabored  Heart:    Normal heart rate.   MS:   All extremities are intact.   Neurologic:           Data Reviewed:    Labs: Basic Metabolic Panel: Recent Labs  Lab 07/18/22 0338 07/18/22 1632 07/19/22 0154 07/19/22 1737 07/20/22 0640  NA 135  --  133*  --  136  K 3.2*  --  3.5  --  4.1  CL 98  --  97*  --  107  CO2 29  --  27  --  25  GLUCOSE 73  --  104*  --  121*  BUN 9  --  7  --  6  CREATININE 1.03  --  0.96  --  0.91  CALCIUM 11.9*  --  11.0*  --  10.2  MG  --  1.4* 1.3* 3.0* 1.9  PHOS  --  2.6 2.4* 2.0* 1.7*    GFR Estimated Creatinine Clearance: 63.3 mL/min (by C-G formula based on SCr of 0.91 mg/dL). Liver Function Tests: Recent Labs  Lab 07/20/22 0640  ALBUMIN  <1.5*    No results for input(s): "LIPASE", "AMYLASE" in the last 168 hours. No results for input(s): "AMMONIA" in the last 168 hours. Coagulation profile No results for input(s): "INR", "PROTIME" in the last 168 hours. COVID-19 Labs  No results for input(s): "DDIMER", "FERRITIN", "LDH", "CRP" in the last 72 hours.   Lab Results  Component Value Date   SARSCOV2NAA NEGATIVE 06/01/2022   SARSCOV2NAA NEGATIVE 05/07/2022    CBC: Recent Labs  Lab 07/18/22 0338 07/19/22 0154  WBC 5.7 5.5  HGB 8.8* 9.0*  HCT 27.0* 27.3*  MCV 68.9* 69.3*  PLT 402* 377    Cardiac Enzymes: No results for input(s): "CKTOTAL", "CKMB", "CKMBINDEX", "TROPONINI" in the last 168 hours. BNP (last 3 results) No results for input(s): "PROBNP" in the last 8760 hours. CBG: Recent Labs  Lab 07/20/22 0010 07/20/22 0410 07/20/22 0846 07/20/22 1202 07/20/22 1650  GLUCAP 119* 132* 101* 100* 117*     D-Dimer: No results for input(s): "DDIMER" in the last 72 hours. Hgb A1c: No results for  input(s): "HGBA1C" in the last 72 hours.  Lipid Profile: No results for input(s): "CHOL", "HDL", "LDLCALC", "TRIG", "CHOLHDL", "LDLDIRECT" in the last 72 hours. Thyroid function studies: No results for input(s): "TSH", "T4TOTAL", "T3FREE", "THYROIDAB" in the last 72 hours.  Invalid input(s): "FREET3" Anemia work up: No results for input(s): "VITAMINB12", "FOLATE", "FERRITIN", "TIBC", "IRON", "RETICCTPCT" in the last 72 hours.  Sepsis Labs: Recent Labs  Lab 07/18/22 0338 07/19/22 0154  WBC 5.7 5.5    Microbiology No results found for this or any previous visit (from the past 240 hour(s)).   Medications:    [START ON 07/21/2022] atorvastatin  40 mg Per Tube Daily   Chlorhexidine Gluconate Cloth  6 each Topical Daily   colchicine  0.3 mg Per Tube BID   sennosides  5 mL Per Tube BID   And   docusate  100 mg Per Tube BID   donepezil  5 mg Per Tube QHS   famotidine  40 mg Per Tube Daily   feeding  supplement  1 Container Oral TID BM   feeding supplement  237 mL Per Tube BID BM   ferrous sulfate  300 mg Per Tube Q breakfast   finasteride  5 mg Oral Daily   gabapentin  200 mg Per Tube Q8H   lidocaine  1 patch Transdermal Q24H   lidocaine  1 patch Transdermal Q24H   magnesium gluconate  500 mg Per Tube BID   methocarbamol  500 mg Per Tube TID   metoprolol succinate  25 mg Oral Daily   mirtazapine  15 mg Per Tube QHS   morphine  15 mg Oral Q12H   nystatin  5 mL Oral QID   polyethylene glycol  17 g Per Tube BID   tamsulosin  0.4 mg Oral QPC supper   thiamine (VITAMIN B1) injection  100 mg Intravenous Daily   Continuous Infusions:  0.9 % NaCl with KCl 20 mEq / L 75 mL/hr at 07/20/22 1345   feeding supplement (OSMOLITE 1.5 CAL) 1,000 mL (07/19/22 1644)       LOS: 55 days   Time spent: 35 minutes.  Barnetta Chapel, MD.  [facility]  07/20/2022, 5:05 PM

## 2022-07-21 ENCOUNTER — Inpatient Hospital Stay (HOSPITAL_COMMUNITY): Payer: Medicaid Other

## 2022-07-21 LAB — RENAL FUNCTION PANEL
Albumin: <1.5 g/dL — ABNORMAL LOW (ref 3.5–5.0)
Anion gap: 6 (ref 5–15)
BUN: 6 mg/dL (ref 6–20)
CO2: 26 mmol/L (ref 22–32)
Calcium: 9.8 mg/dL (ref 8.9–10.3)
Chloride: 102 mmol/L (ref 98–111)
Creatinine, Ser: 0.77 mg/dL (ref 0.61–1.24)
GFR, Estimated: >60 mL/min (ref >60)
Glucose, Bld: 138 mg/dL — ABNORMAL HIGH (ref 70–99)
Phosphorus: 2.9 mg/dL (ref 2.5–4.6)
Potassium: 3.8 mmol/L (ref 3.5–5.1)
Sodium: 134 mmol/L — ABNORMAL LOW (ref 135–145)

## 2022-07-21 LAB — GLUCOSE, CAPILLARY
Glucose-Capillary: 140 mg/dL — ABNORMAL HIGH (ref 70–99)
Glucose-Capillary: 158 mg/dL — ABNORMAL HIGH (ref 70–99)
Glucose-Capillary: 112 mg/dL — ABNORMAL HIGH (ref 70–99)
Glucose-Capillary: 126 mg/dL — ABNORMAL HIGH (ref 70–99)
Glucose-Capillary: 109 mg/dL — ABNORMAL HIGH (ref 70–99)

## 2022-07-21 LAB — MAGNESIUM: Magnesium: 1.6 mg/dL — ABNORMAL LOW (ref 1.7–2.4)

## 2022-07-21 MED ORDER — THIAMINE MONONITRATE 100 MG PO TABS
100.0000 mg | ORAL_TABLET | Freq: Every day | ORAL | Status: DC
  Administered 2022-07-21 – 2022-08-01 (×12): 100 mg
  Filled 2022-07-21 (×12): qty 1

## 2022-07-21 MED ORDER — MAGNESIUM SULFATE 2 GM/50ML IV SOLN
2.0000 g | Freq: Once | INTRAVENOUS | Status: AC
  Administered 2022-07-21: 2 g via INTRAVENOUS
  Filled 2022-07-21: qty 50

## 2022-07-21 NOTE — Progress Notes (Signed)
SLP Cancellation Note  Patient Details Name: Wayne Stokes MRN: [id number] DOB: 1965-11-25   Cancelled treatment:       Reason Eval/Treat Not Completed: Other (comment). Esophagram today, SLP will f/u as needed   Kemaria Dedic, [name] 07/21/2022, 9:07 AM

## 2022-07-21 NOTE — Progress Notes (Signed)
[facility] Progress Note Patient: Wayne Stokes [id number]:[id number] DOB: November 10, 1965 DOA: 05/26/2022  DOS: the patient was seen and examined on 07/21/2022  Brief hospital course: Wayne Stokes is a Wayne Stokes speaking 56 y.o. 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 Neurosurgery and orthopedics were consulted recommended TLSO brace and pain control. IR consulted for kyphoplasty.  Completed on 06/18/2022. There was concern for right shoulder septic arthritis.  ID was consulted orthopedics  consult.  Unable to aspirate the joint.  Antibiotics were discontinued. Was seen by dietitian and on 11/10 started on a core track tube feed.  Speech therapy following for poor swallowing function. Assessment and Plan: T12 compression fracture with back pain. Lumbar spinal stenosis. MRI negative for cord injury. Neurosurgery recommended conservative measures. IR was consulted and underwent kyphoplasty on 10/11. Continues to improve.  Dysphagia. Poor p.o. intake. Severe protein calorie malnutrition. Stokes mass index is 22.26 kg/m. Nutrition Problem: Severe Malnutrition Etiology: acute illness Nutrition Interventions: Interventions: Tube feeding  Currently has a core track and is on tube feeding. Patient continues to exhibit significant language barrier.  Speech therapy following the patient. Currently on dysphagia 1 nectar thick diet based on MBS. Esophagogram ordered. May require GI consult for further work-up I will continue core track until his nutrition is improved or his oral intake is improving.  Possible thrush. On nystatin.  Chronic right shoulder pain. Right wrist pain. Patient having right shoulder pain. Orthopedic  surgery was consulted unable to aspirate the joint. MRI was concerning for septic arthritis. Patient was actually given IV antibiotic. ID was consulted. Given work-up was negative ID discontinue the antibiotic. Continues to report pain in his right shoulder and right wrist area.  X-ray shoulder shows possibility of a persistent effusion.  X-ray right wrist shows possible inflammation. MRI wrist shows inflammatory arthropathy versus septic arthritis. Currently clinically improving. We will recheck ESR and CRP tomorrow. Most likely this is inflammatory process and patient may benefit from initiation of some steroid therapy.  But will discuss with Ortho and ID before considering that therapy option.   Acute urinary retention. Failed voiding trial 3 times. Currently on Flomax and Proscar. Outpatient urology follow-up.    Language barrier. Medication compliance. Intermittent issue. We will monitor.  Constipation. Continue bowel regimen.  Hypoalbuminemia. Monitor for third spacing.  Hypomagnesemia. Possible refeeding syndrome. We will replace.   Subjective: No nausea no vomiting no fever no chills.  Denies any acute abdominal pain.  Denies any severe shoulder pain.  Only report feeling cold.  Physical Exam: General: in mild distress;  Cardiovascular: S1 and S2 Present, no Murmur Respiratory: normal respiratory effort, Bilateral Air entry present, no Crackles, no wheezes Abdomen: Bowel Sound present, Non tender  Extremities: no edeam Neurology: alert and oriented to self   Data Reviewed: I have Reviewed nursing notes, Vitals, and Lab results. Since last encounter, pertinent lab results CBC and BMP   . I have ordered test including CBC BMP magnesium potassium  .   Disposition: Status is: Inpatient Remains inpatient appropriate because: Need for tube feeds.  SCDs Start: 05/26/22 2254   Family Communication: No one at bedside Level of care: Med-Surg  Vitals:   07/20/22  1400 07/20/22 1945 07/21/22 0500 07/21/22 0709  BP: 95/82 (!) 126/96  (!) 128/97  Pulse:  98 100  (!) 110  Resp: 19 18    Temp: 97.9 F (36.6 C) 98.5 F (36.9 C)  98.7 F (37.1 C)  TempSrc:  Oral  Oral  SpO2: 97% 93%  97%  Weight:   51.7 kg   Height:         Author: Berle Mull, MD 07/21/2022 11:17 AM  Please look on [email or web address] to find out who is on call.

## 2022-07-21 NOTE — Progress Notes (Signed)
Physical Therapy Treatment Patient Details Name: Wayne Stokes MRN: [id number] DOB: Jan 23, 1966 Today's Date: 07/21/2022   History of Present Illness 56 y.o. male presented 05/26/22 with c/o severe and progressively worsening low back pain that radiates into his left buttock and down his LLE. Imaging +acute T12 compression fracture and  L4/5 there is multifactorial severe spinal stenosis and severe bilateral foraminal stenosis. Neurosurgery consult with no acute surgical intervention needed. IR consulted for possible kyphoplasty bu twas delayed due to fever; S/p T12 KP with biopsy on 10/11. Pt coughing with water consumption 11/9 DO notified he may need SLP consult. 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    PT Comments    Pt received in supine, agreeable to therapy session with heavy encouragement, pt c/o pain in B hands and throat (now has cortrak tube), RN notified. Pt needing up to modA for log roll to/from EOB and up to maxA for sit<>stand and stand pivot transfers ~4-20ft x2 reps with RW support. Pt standing tolerance limited due to pain/fatigue and pt impulsive to sit prior to reaching proximity to next surface. Pt back in bed in care of transport tech to leave for fluoro dept at end of session. Translation live via [facility] interpreter. Pt continues to benefit from PT services to progress toward functional mobility goals.   Recommendations for follow up therapy are one component of a multi-disciplinary discharge planning process, led by the attending physician.  Recommendations may be updated based on patient status, additional functional criteria and insurance authorization.  Follow Up Recommendations  Skilled nursing-short term rehab (<3 hours/day) Can patient physically be transported by private vehicle: Yes   Assistance Recommended at Discharge Frequent or constant  Supervision/Assistance  Patient can return home with the following Assistance with cooking/housework;Direct supervision/assist for medications management;Direct supervision/assist for financial management;Assist for transportation;Help with stairs or ramp for entrance;A lot of help with walking and/or transfers   Equipment Recommendations  BSC/3in1;Rollator (4 wheels)    Recommendations for Other Services       Precautions / Restrictions Precautions Precautions: Fall;Back Precaution Booklet Issued: No Precaution Comments: Back precautions for comfort Required Braces or Orthoses: Spinal Brace Spinal Brace: Thoracolumbosacral orthotic Restrictions Weight Bearing Restrictions: No     Mobility  Bed Mobility Overal bed mobility: Needs Assistance Bed Mobility: Rolling, Sidelying to Sit, Sit to Sidelying Rolling: Min assist Sidelying to sit: Mod assist     Sit to sidelying: Mod assist General bed mobility comments: increased time and multimodal cues for sequencing; log roll for back comfort; increased assist today due to pt c/o increased pain.    Transfers Overall transfer level: Needs assistance Equipment used: Rolling walker (2 wheels) Transfers: Sit to/from Stand Sit to Stand: Mod assist, Max assist   Step pivot transfers: Max assist, Mod assist       General transfer comment: mod assist to stand from EOB<>RW, maxA to sit from RW>chair and from RW>EOB due to pain/fatigue and premature sitting. Posterior lean    Ambulation/Gait               General Gait Details: pt impulsive to sit after ~15ft, unable to go further than this. Second attempt, transport arriving to room to take him to test so ~47ft x2 total pivot with RW with mod/maxA   Stairs             Wheelchair Mobility    Modified Rankin (Stroke Patients  Only)       Balance Overall balance assessment: Needs assistance Sitting-balance support: Feet supported, No upper extremity supported Sitting  balance-Leahy Scale: Fair Sitting balance - Comments: Pt able to maintain sitting balance while donning back brace Postural control: Posterior lean Standing balance support: During functional activity, Bilateral upper extremity supported, Reliant on assistive device for balance Standing balance-Leahy Scale: Poor Standing balance comment: Reliant on RW                            Cognition Arousal/Alertness: Awake/alert (sleeping but able to be awoken) Behavior During Therapy: Impulsive, Flat affect Overall Cognitive Status: Impaired/Different from baseline Area of Impairment: Safety/judgement, Following commands, Problem solving, Memory                 Orientation Level: Disoriented to, Time, Situation   Memory: Decreased recall of precautions, Decreased short-term memory Following Commands: Follows multi-step commands inconsistently, Follows one step commands inconsistently Safety/Judgement: Decreased awareness of safety, Decreased awareness of deficits Awareness: Intellectual Problem Solving: Slow processing, Requires verbal cues, Decreased initiation, Difficulty sequencing General Comments: Translator Keo present, pt reporting pain when PTA arrived but agreeable to OOB transfer with max encouragement. Pt following 1-step commands with multimodal cues and impulsive to sit prior to reaching chair/bed surfaces.        Exercises      General Comments General comments (skin integrity, edema, etc.): VSS per chart review, RN notified of pt increased edema in B hands; UE elevated on pillows at end of session in bed      Pertinent Vitals/Pain Pain Assessment Pain Assessment: Faces Faces Pain Scale: Hurts whole lot Pain Location: B hands (edema), nose/throat  sore from cortrak, R knee with WB Pain Descriptors / Indicators: Moaning, Grimacing, Sore Pain Intervention(s): Monitored during session, Repositioned, Limited activity within patient's tolerance, Patient  requesting pain meds-RN notified           PT Goals (current goals can now be found in the care plan section) Acute Rehab PT Goals Patient Stated Goal: decreased pain PT Goal Formulation: With patient Time For Goal Achievement: 07/22/22 Progress towards PT goals: Progressing toward goals    Frequency    Min 3X/week      PT Plan Current plan remains appropriate       AM-PAC PT "6 Clicks" Mobility   Outcome Measure  Help needed turning from your back to your side while in a flat bed without using bedrails?: A Little Help needed moving from lying on your back to sitting on the side of a flat bed without using bedrails?: A Lot Help needed moving to and from a bed to a chair (including a wheelchair)?: A Lot Help needed standing up from a chair using your arms (e.g., wheelchair or bedside chair)?: A Lot Help needed to walk in hospital room?: Total Help needed climbing 3-5 steps with a railing? : Total 6 Click Score: 11    End of Session Equipment Utilized During Treatment: Back brace;Gait belt Activity Tolerance: Patient limited by pain;Patient tolerated treatment well Patient left: in bed;with call bell/phone within reach;with bed alarm set;Other (comment) (HOB elevated >30* due to NGT and transport entering room to take him to fluoro dept) Nurse Communication: Mobility status;Precautions PT Visit Diagnosis: Other abnormalities of gait and mobility (R26.89);Muscle weakness (generalized) (M62.81);Pain Pain - Right/Left: Right Pain - part of body: Shoulder (and R knee)     Time: 1206-1227 PT Time Calculation (min) (ACUTE ONLY):  21 min  Charges:  $Therapeutic Activity: 8-22 mins                     Genessis Flanary P., PTA Acute Rehabilitation Services Secure Chat Preferred 9a-5:30pm Office: [phone]    Dorathy Kinsman [facility] 07/21/2022, 1:01 PM

## 2022-07-22 DIAGNOSIS — R1312 Dysphagia, oropharyngeal phase: Secondary | ICD-10-CM

## 2022-07-22 LAB — COMPREHENSIVE METABOLIC PANEL
ALT: 8 U/L (ref 0–44)
AST: 24 U/L (ref 15–41)
Albumin: <1.5 g/dL — ABNORMAL LOW (ref 3.5–5.0)
Alkaline Phosphatase: 76 U/L (ref 38–126)
Anion gap: 6 (ref 5–15)
BUN: 6 mg/dL (ref 6–20)
CO2: 26 mmol/L (ref 22–32)
Calcium: 9.2 mg/dL (ref 8.9–10.3)
Chloride: 104 mmol/L (ref 98–111)
Creatinine, Ser: 0.82 mg/dL (ref 0.61–1.24)
GFR, Estimated: >60 mL/min (ref >60)
Glucose, Bld: 170 mg/dL — ABNORMAL HIGH (ref 70–99)
Potassium: 4.2 mmol/L (ref 3.5–5.1)
Sodium: 136 mmol/L (ref 135–145)
Total Bilirubin: 0.3 mg/dL (ref 0.3–1.2)
Total Protein: 5.8 g/dL — ABNORMAL LOW (ref 6.5–8.1)

## 2022-07-22 LAB — GLUCOSE, CAPILLARY
Glucose-Capillary: 101 mg/dL — ABNORMAL HIGH (ref 70–99)
Glucose-Capillary: 114 mg/dL — ABNORMAL HIGH (ref 70–99)
Glucose-Capillary: 156 mg/dL — ABNORMAL HIGH (ref 70–99)
Glucose-Capillary: 178 mg/dL — ABNORMAL HIGH (ref 70–99)
Glucose-Capillary: 98 mg/dL (ref 70–99)
Glucose-Capillary: 99 mg/dL (ref 70–99)

## 2022-07-22 LAB — MAGNESIUM: Magnesium: 1.5 mg/dL — ABNORMAL LOW (ref 1.7–2.4)

## 2022-07-22 LAB — PHOSPHORUS: Phosphorus: 3.5 mg/dL (ref 2.5–4.6)

## 2022-07-22 MED ORDER — MAGNESIUM SULFATE 2 GM/50ML IV SOLN
2.0000 g | Freq: Once | INTRAVENOUS | Status: AC
  Administered 2022-07-22: 2 g via INTRAVENOUS
  Filled 2022-07-22: qty 50

## 2022-07-22 NOTE — Progress Notes (Addendum)
[facility] Progress Note Patient: Wayne Stokes ATF:[id number] DOB: 12-17-1965 DOA: 05/26/2022  DOS: the patient was seen and examined on 07/22/2022  Brief hospital course: Wayne Stokes is a Wayne Stokes speaking 56 y.o. 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 Neurosurgery and orthopedics were consulted recommended TLSO brace and pain control. IR consulted for kyphoplasty.  Completed on 06/18/2022. There was concern for right shoulder septic arthritis.  ID was consulted orthopedics  consult.  Unable to aspirate the joint.  Antibiotics were discontinued. Was seen by dietitian and on 11/10 started on a core track tube feed.  Speech therapy following for poor swallowing function. Assessment and Plan: Dysphagia.  Esophageal dysmotility. Poor p.o. intake. Severe protein calorie malnutrition. Stokes mass index is 22.26 kg/m. Interventions: Tube feeding  Currently has a core track and is on tube feeding. Patient continues to exhibit significant language barrier.  Speech therapy following the patient. Currently on dysphagia 1 nectar thick diet based on MBS. Esophagogram shows esophageal dysmotility. I will continue core track until his nutrition is improved or his oral intake is improving. Gastroenterology consulted Stop IV fluids.   Possible thrush. On nystatin.  Hypoalbuminemia. Monitor for third spacing. I will stop IV fluid now that the patient is at goal with tube feeds.   Hypomagnesemia. Possible refeeding syndrome. We will replace.  T12 compression fracture with back pain. Lumbar spinal stenosis. MRI negative for cord injury. Neurosurgery recommended conservative measures. IR was consulted and underwent  kyphoplasty on 10/11. Continues to improve.   Chronic right shoulder pain. Right wrist pain. Patient having right shoulder pain. Orthopedic surgery was consulted unable to aspirate the joint. MRI was concerning for septic arthritis. Patient was actually given IV antibiotic. ID was consulted. work-up was negative for any infection, ID discontinue the antibiotic. Continues to report pain in his right shoulder and right wrist area.  X-ray shoulder shows possibility of a persistent effusion.  X-ray right wrist shows possible inflammation. MRI wrist shows inflammatory arthropathy versus septic arthritis. Currently clinically improving. We will recheck ESR and CRP tomorrow. Most likely this is inflammatory process and patient may benefit from initiation of some steroid therapy.  But will discuss with Ortho and ID before considering that therapy option.   Acute urinary retention. Failed voiding trial 3 times. Currently on Flomax and Proscar. Outpatient urology follow-up.     Language barrier. Medication compliance. Intermittent issue. We will monitor.   Constipation. Continue bowel regimen.   Subjective: Denies any acute complaint.  Reports pain in his abdomen.  Pain in his shoulder.  No nausea no vomiting.  Tolerating tube feeds.  Physical Exam: General: in mild distress;  Cardiovascular: S1 and S2 Present, no Murmur Respiratory: normal respiratory effort, Bilateral Air entry present, no Crackles, no wheezes Abdomen: Bowel Sound present, Non tender  Extremities: no edema Neurology: alert and oriented to time, place, and person   Data Reviewed: I have Reviewed nursing notes, Vitals, and Lab results. Since last encounter, pertinent lab results CBC and BMP   . I have ordered test including CBC BMP ESR CRP  .   Disposition: Status is: Inpatient Remains inpatient appropriate because: Need tube feeding for malnutrition  SCDs Start: 05/26/22 2254   Family Communication: No one at  bedside Level of care: Med-Surg  Vitals:  07/21/22 2033 07/22/22 0458 07/22/22 0500 07/22/22 0815  BP: 118/70 (!) 160/84  (!) 132/91  Pulse: 97 (!) 102  (!) 103  Resp: _0 Temp: 98 F (36.7 C)   98.3 F (36.8 C)  TempSrc:    Oral  SpO2: 95% 95%  100%  Weight:   54.8 kg   Height:         Author: Berle Mull, MD 07/22/2022 10:26 AM  Please look on [email or web address] to find out who is on call.

## 2022-07-22 NOTE — Progress Notes (Signed)
Speech Language Pathology Treatment: Dysphagia  Patient Details Name: Wayne Stokes MRN: [id number] DOB: 1966/04/23 Today's Date: 07/22/2022 Time: 1100-1117 SLP Time Calculation (min) (ACUTE ONLY): 17 min  Assessment / Plan / Recommendation Clinical Impression  Pt seen for swallowing therapy with [name] interpreter, Modesta Messing, in room.  Reviewed results of MBS. Esophagram report suggestive of mild-moderate esophageal motility. Pt unreliable with chin tuck with moderate-maximal cuing, but did not exhibit any clinical s/s of aspiration during these trials.  Vocal quality remained clear.  After 4-5 sips of water, 1 spoonful of applesauce, and 1 trial of soft solid, pt declined further trials.  He indicated pain in chest and stated it felt like reflux.  Discussed diet preference with pt who does not like hospital diet.  Pt has some outside food in room, seemingly very soft in consistency.  Pt states PTA he was boiling, steaming, and cutting food very small.  Pt's has lost a significant amount of weight and largest barrier to PO intake seems to be esophageal symptoms.  SLP will continue to follow pt for compensatory strategy training for possible advancement of liquid consistency.  Consider GI consult as well to address esophageal dysmotility.    Recommend continuing puree diet with nectar thick liquids with supplementary nutrition by NGT.    HPI HPI: Wayne Stokes is an 56 y.o. male from [place] who speaks Estanislado Spire and was admitted for uncontrolled back pain and severe spinal lumbar stenosis orthopedic, neurosurgery evaluated the patient and recommended TLC L for pain control and outpatient follow-up.  Physical therapy recommended inpatient rehab difficult to place due to lack of insurance.  IR was consulted to perform kyphoplasty of T12 compression fracture on 06/18/2022. CT neck showed Moderate degenerative disc disease is noted at C4-5 and  C5-6.  Pt reprots difficulty swallowing, has severe weight  loss. PMH: diabetes mellitus type 2, essential hypertension with lumbar disc disease with spinal stenosis, T12 compression fracture and mild cognitive impairment      SLP Plan  Continue with current plan of care;Consult other service (comment) (GI)      Recommendations for follow up therapy are one component of a multi-disciplinary discharge planning process, led by the attending physician.  Recommendations may be updated based on patient status, additional functional criteria and insurance authorization.    Recommendations  Diet recommendations: Dysphagia 1 (puree);Nectar-thick liquid Medication Administration: Crushed with puree Compensations: Slow rate;Small sips/bites Postural Changes and/or Swallow Maneuvers: Seated upright 90 degrees;Upright 30-60 min after meal                Oral Care Recommendations: Oral care BID Follow Up Recommendations: Skilled nursing-short term rehab (<3 hours/day) Assistance recommended at discharge: Intermittent Supervision/Assistance SLP Visit Diagnosis: Dysphagia, oropharyngeal phase (R13.12);Dysphagia, pharyngoesophageal phase (R13.14) Plan: Continue with current plan of care;Consult other service (comment) (GI)           Kerrie Pleasure, MA, CCC-SLP Acute Rehabilitation Services Office: [phone] 07/22/2022, 11:32 AM

## 2022-07-22 NOTE — Progress Notes (Signed)
Occupational Therapy Treatment Patient Details Name: Wayne Stokes MRN: [id number] DOB: [birth year] Today's Date: 07/22/2022   History of present illness 56 y.o. male presented 05/26/22 with c/o severe and progressively worsening low back pain that radiates into his left buttock and down his LLE. Imaging +acute T12 compression fracture and  L4/5 there is multifactorial severe spinal stenosis and severe bilateral foraminal stenosis. Neurosurgery consult with no acute surgical intervention needed. IR consulted for possible kyphoplasty bu twas delayed due to fever; S/p T12 KP with biopsy on 10/11. Pt coughing with water consumption 11/9 DO notified he may need SLP consult. 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   OT comments  Patient received in bed and agreeable to OT session. Patient required assistance to initiate bed mobility and mod assist to get to EOB to donn back brace. Patient was able to ambulate to bathroom with RW and mod assist and was unable to stand for grooming tasks.  Patient moaned during treatment and when asked where he hurts patient stated, "no pain, cold." Patient asked to return to bed at end of session with mod assist.  Acute OT to continue to follow.    Recommendations for follow up therapy are one component of a multi-disciplinary discharge planning process, led by the attending physician.  Recommendations may be updated based on patient status, additional functional criteria and insurance authorization.    Follow Up Recommendations  Skilled nursing-short term rehab (<3 hours/day)     Assistance Recommended at Discharge Frequent or constant Supervision/Assistance  Patient can return home with the following  A little help with walking and/or transfers;Assistance with cooking/housework;Assist for transportation;Help with stairs or ramp for entrance;A lot of help with  bathing/dressing/bathroom;Direct supervision/assist for medications management   Equipment Recommendations  BSC/3in1    Recommendations for Other Services      Precautions / Restrictions Precautions Precautions: Fall;Back Precaution Booklet Issued: No Precaution Comments: Back precautions for comfort Required Braces or Orthoses: Spinal Brace Spinal Brace: Thoracolumbosacral orthotic Restrictions Weight Bearing Restrictions: No       Mobility Bed Mobility Overal bed mobility: Needs Assistance Bed Mobility: Rolling, Sidelying to Sit, Sit to Sidelying Rolling: Min assist Sidelying to sit: Mod assist     Sit to sidelying: Mod assist General bed mobility comments: increased time and multimodal cues to initiate and sequence    Transfers Overall transfer level: Needs assistance Equipment used: Rolling walker (2 wheels) Transfers: Sit to/from Stand Sit to Stand: Mod assist     Step pivot transfers: Mod assist     General transfer comment: mod assist to power up and stedy with assistance to steer walker     Balance Overall balance assessment: Needs assistance Sitting-balance support: Feet supported, No upper extremity supported Sitting balance-Leahy Scale: Fair Sitting balance - Comments: able to maintain sitting balance while donning back brace   Standing balance support: During functional activity, Bilateral upper extremity supported, Reliant on assistive device for balance Standing balance-Leahy Scale: Poor Standing balance comment: Reliant on RW                           ADL either performed or assessed with clinical judgement   ADL Overall ADL's : Needs assistance/impaired     Grooming: Wash/dry hands;Wash/dry face;Oral care;Supervision/safety;Sitting Grooming Details (indicate cue type and reason): seated at sink, patient declined standing  General ADL Comments: patient unsteady while standing     Extremity/Trunk Assessment Upper Extremity Assessment RUE Deficits / Details: right shoulder pain. Decreased grasp strength and finger dexerity RUE Coordination: decreased fine motor;decreased gross motor LUE Deficits / Details: increased shoulder pain. Decreased grasp strength and finger dexerity LUE Coordination: decreased fine motor;decreased gross motor            Vision       Perception     Praxis      Cognition Arousal/Alertness: Awake/alert (asleep but easily awaken) Behavior During Therapy: Flat affect Overall Cognitive Status: Impaired/Different from baseline Area of Impairment: Safety/judgement, Following commands, Problem solving, Memory                 Orientation Level: Disoriented to, Time, Situation   Memory: Decreased recall of precautions, Decreased short-term memory Following Commands: Follows multi-step commands inconsistently, Follows one step commands inconsistently Safety/Judgement: Decreased awareness of safety, Decreased awareness of deficits Awareness: Intellectual Problem Solving: Slow processing, Requires verbal cues, Decreased initiation, Difficulty sequencing General Comments: When asked where he hurts patient states, "No pain, cold" but indicates shoulder pain        Exercises      Shoulder Instructions       General Comments      Pertinent Vitals/ Pain       Pain Assessment Pain Assessment: Faces Faces Pain Scale: Hurts little more Pain Location: BUE shoulders Pain Descriptors / Indicators: Moaning, Grimacing, Sore Pain Intervention(s): Limited activity within patient's tolerance, Monitored during session, Repositioned  Home Living                                          Prior Functioning/Environment              Frequency  Min 2X/week        Progress Toward Goals  OT Goals(current goals can now be found in the care plan section)  Progress towards OT goals: Progressing toward  goals  Acute Rehab OT Goals Patient Stated Goal: get better OT Goal Formulation: With patient Time For Goal Achievement: 07/24/22 Potential to Achieve Goals: Fair ADL Goals Pt Will Perform Grooming: with supervision;standing Pt Will Perform Lower Body Dressing: with supervision;sit to/from stand Pt Will Transfer to Toilet: with supervision;ambulating;regular height toilet Pt Will Perform Toileting - Clothing Manipulation and hygiene: with supervision;sit to/from stand Pt/caregiver will Perform Home Exercise Program: Increased strength;Both right and left upper extremity;With theraputty Additional ADL Goal #1: Pt will perform bed mobility independently in preparation for ADLs. Additional ADL Goal #2: Pt will participate in 30 minutes of activity with intermittent rest breaks. Additional ADL Goal #3: Pt will follow 2 step commands with 50% accuracy with interpreter.  Plan Discharge plan remains appropriate    Co-evaluation                 AM-PAC OT "6 Clicks" Daily Activity     Outcome Measure   Help from another person eating meals?: None Help from another person taking care of personal grooming?: A Little Help from another person toileting, which includes using toliet, bedpan, or urinal?: A Little Help from another person bathing (including washing, rinsing, drying)?: A Little Help from another person to put on and taking off regular upper body clothing?: A Little Help from another person to put on and taking off regular lower body clothing?: A Lot 6 Click Score:  18    End of Session Equipment Utilized During Treatment: Gait belt;Rolling walker (2 wheels);Back brace  OT Visit Diagnosis: Other abnormalities of gait and mobility (R26.89);Muscle weakness (generalized) (M62.81);Pain Pain - Right/Left: Right Pain - part of body: Shoulder   Activity Tolerance Patient limited by pain   Patient Left in bed;with call bell/phone within reach;with bed alarm set   Nurse  Communication Mobility status        Time: [id number] OT Time Calculation (min): 32 min  Charges: OT General Charges $OT Visit: 1 Visit OT Treatments $Self Care/Home Management : 8-22 mins $Therapeutic Activity: 8-22 mins  Lodema Hong, OTA Acute Rehabilitation Services  Office [phone]   Trixie Dredge 07/22/2022, 2:51 PM

## 2022-07-22 NOTE — Consult Note (Signed)
Consultation Referring physician ;TRH/Patel Primary Care Physician:  Kathrynn Speed, NP Primary Gastroenterologist:  none/unassigned  Reason for Consultation: Abnormal barium swallow  HPI: Wayne Stokes is a 56 y.o. male non-English speaking  [place] who was admitted almost 2 months ago.  Patient has history of hypertension, adult onset diabetes mellitus, lumbar disc disease and spinal stenosis.  He was admitted for uncontrolled back pain, severe lumbar stenosis and with an acute T12 compression fracture.  Also per notes with history of mild cognitive impairment. Neurosurgery recommended pain control, physical therapy recommended inpatient rehab due to lack of insurance.  IR was consulted and he underwent kyphoplasty of T12 compression fracture on 06/18/2022  Course has been complicated by a weight loss and poor oral intake and plan is for transfer to a skilled nursing facility.  We are asked to evaluate regarding an abnormal barium swallow.  He had swallowing eval per speech path on 07/20/2022 this showed weak bolus manipulation, impaired propulsion, pharyngeal delay, reduced anterior laryngeal movement when he showed difficulty sucking from a straw, premature spillage and penetration with thin liquids visualization of the esophagus showed stasis of barium throughout the thoracic esophagus which did not clear with liquid boluses  He then had a barium swallow done yesterday which shows mild to moderate esophageal dysmotility, no evidence of GERD  He has had core track placed to help with malnutrition, and is currently also on a dysphagia 1 diet. He is also being treated for possible thrush with nystatin.  Patient is currently asleep, did not arouse to voice, no family present   Past Medical History:  Diagnosis Date   Diabetes mellitus without complication (HCC)    Hypertension     Past Surgical History:  Procedure Laterality Date   IR KYPHO LUMBAR INC FX REDUCE BONE BX UNI/BIL  CANNULATION INC/IMAGING  06/18/2022   IR LUMBAR DISC ASPIRATION W/IMG GUIDE  06/02/2022    Prior to Admission medications   Medication Sig Start Date End Date Taking? Authorizing Provider  amLODipine (NORVASC) 5 MG tablet Take 1 tablet (5 mg total) by mouth daily. 03/13/22 06/11/22  [name], [name], NP  atorvastatin (LIPITOR) 40 MG tablet Take 1 tablet (40 mg total) by mouth daily. 11/18/21   [name], NP  clotrimazole-betamethasone (LOTRISONE) cream Apply 1 Application topically daily. 03/17/22   Ivonne Andrew, NP  diclofenac (VOLTAREN) 75 MG EC tablet Take 1 tablet (75 mg total) by mouth 2 (two) times daily. 05/19/22   Elson Areas, PA-C  donepezil (ARICEPT) 5 MG tablet Take 1 tablet (5 mg total) by mouth at bedtime. 12/12/21   Windell Norfolk, MD  gabapentin (NEURONTIN) 300 MG capsule Take 1 capsule (300 mg total) by mouth 3 (three) times daily. 05/15/22 08/13/22  Ivonne Andrew, NP  glipiZIDE (GLUCOTROL) 5 MG tablet Take 1 tablet (5 mg total) by mouth 2 (two) times daily before a meal. 03/17/22   Ivonne Andrew, NP  losartan-hydrochlorothiazide (HYZAAR) 50-12.5 MG tablet Take 1 tablet by mouth daily. 12/16/21 05/16/22  [name], NP  metFORMIN (GLUCOPHAGE) 500 MG tablet Take 1 tablet (500 mg total) by mouth 2 (two) times daily with a meal. 11/15/21   [name], [name], NP  metoprolol succinate (TOPROL-XL) 25 MG 24 hr tablet Take 1 tablet (25 mg total) by mouth daily. 03/13/22 06/18/22  [name], [name], NP  Omega 3 1000 MG CAPS Take 1,000 mg by mouth daily.    [provider]  oxyCODONE-acetaminophen (PERCOCET) [phone]  MG tablet Take 1 tablet by mouth every 4 (four) hours as needed for severe pain. 05/19/22 05/19/23  Elson Areas, PA-C    Current Facility-Administered Medications  Medication Dose Route Frequency Provider Last Rate Last Admin   acetaminophen (TYLENOL) tablet 650 mg  650 mg Per Tube Q6H PRN Pham, Minh Q, RPH-CPP       atorvastatin (LIPITOR) tablet 40  mg  40 mg Per Tube Daily Pham, Minh Q, RPH-CPP   40 mg at 07/22/22 0925   bisacodyl (DULCOLAX) suppository 10 mg  10 mg Rectal Daily PRN Marguerita Merles Latif, DO   10 mg at 06/17/22 6378   Chlorhexidine Gluconate Cloth 2 % PADS 6 each  6 each Topical Daily Alberteen Sam, MD   6 each at 07/22/22 0929   colchicine tablet 0.3 mg  0.3 mg Per Tube BID Pham, Minh Q, RPH-CPP   0.3 mg at 07/22/22 0928   sennosides (SENOKOT) 8.8 MG/5ML syrup 5 mL  5 mL Per Tube BID Pham, Minh Q, RPH-CPP   5 mL at 07/22/22 5885   And   docusate (COLACE) 50 MG/5ML liquid 100 mg  100 mg Per Tube BID Pham, Minh Q, RPH-CPP   100 mg at 07/22/22 0925   donepezil (ARICEPT) tablet 5 mg  5 mg Per Tube QHS Pham, Minh Q, RPH-CPP   5 mg at 07/21/22 2121   famotidine (PEPCID) tablet 40 mg  40 mg Per Tube Daily Pham, Minh Q, RPH-CPP   40 mg at 07/22/22 0277   feeding supplement (BOOST / RESOURCE BREEZE) liquid 1 Container  1 Container Oral TID BM Rolly Salter, MD   1 Container at 07/18/22 2042   feeding supplement (ENSURE ENLIVE / ENSURE PLUS) liquid 237 mL  237 mL Per Tube BID BM Rolly Salter, MD   237 mL at 07/20/22 1354   feeding supplement (OSMOLITE 1.5 CAL) liquid 1,000 mL  1,000 mL Per Tube Continuous Marlin Canary U, DO 30 mL/hr at 07/20/22 1843 Rate Change at 07/20/22 1843   ferrous sulfate 300 (60 Fe) MG/5ML syrup 300 mg  300 mg Per Tube Q breakfast Pham, Minh Q, RPH-CPP   300 mg at 07/22/22 0928   finasteride (PROSCAR) tablet 5 mg  5 mg Oral Daily Pham, Minh Q, RPH-CPP   5 mg at 07/22/22 0925   gabapentin (NEURONTIN) 250 MG/5ML solution 200 mg  200 mg Per Tube Q8H Pham, Minh Q, RPH-CPP   200 mg at 07/22/22 1404   lidocaine (LIDODERM) 5 % 1 patch  1 patch Transdermal Q24H Marguerita Merles [place], DO   1 patch at 07/22/22 1404   lidocaine (LIDODERM) 5 % 1 patch  1 patch Transdermal Q24H Burnadette Pop, MD   1 patch at 07/22/22 0925   lidocaine (XYLOCAINE) 2 % viscous mouth solution 15 mL  15 mL Mouth/Throat Q3H PRN Marinda Elk, MD       magnesium gluconate (MAGONATE) tablet 500 mg  500 mg Per Tube BID Pham, Minh Q, RPH-CPP   500 mg at 07/22/22 0928   methocarbamol (ROBAXIN) tablet 500 mg  500 mg Per Tube TID Pham, Minh Q, RPH-CPP   500 mg at 07/22/22 0925   metoprolol succinate (TOPROL-XL) 24 hr tablet 25 mg  25 mg Oral Daily Marinda Elk, MD   25 mg at 07/22/22 0925   mirtazapine (REMERON SOL-TAB) disintegrating tablet 15 mg  15 mg Per Tube QHS Pham, Minh Q, RPH-CPP   15 mg at  07/21/22 2121   morphine (MS CONTIN) 12 hr tablet 15 mg  15 mg Oral Q12H Lonia Blood, MD   15 mg at 07/22/22 4132   Muscle Rub CREA 1 Application  1 Application Topical PRN Marguerita Merles [place], DO   1 Application at 06/14/22 2111   nystatin (MYCOSTATIN) 100000 UNIT/ML suspension 500,000 Units  5 mL Oral QID Marlin Canary U, DO   500,000 Units at 07/22/22 1403   ondansetron (ZOFRAN) tablet 4 mg  4 mg Per Tube Q6H PRN Ulyses [place] Q, RPH-CPP       Oral care mouth rinse  15 mL Mouth Rinse PRN Lonia Blood, MD       polyethylene glycol (MIRALAX / GLYCOLAX) packet 17 g  17 g Per Tube BID Pham, Minh Q, RPH-CPP   17 g at 07/22/22 0925   simethicone (MYLICON) 40 MG/0.6ML suspension 80 mg  80 mg Per Tube Q6H PRN Pham, Minh Q, RPH-CPP       sodium chloride (PF) 0.9 % injection 10 mL  10 mL Other PRN Kathlynn Grate, DO   10 mL at 06/11/22 1019   tamsulosin (FLOMAX) capsule 0.4 mg  0.4 mg Oral QPC supper Rolly Salter, MD   0.4 mg at 07/21/22 1816   thiamine (VITAMIN B1) tablet 100 mg  100 mg Per Tube Daily Calton Dach I, RPH   100 mg at 07/22/22 4401   traMADol (ULTRAM) tablet 50 mg  50 mg Per Tube Q6H PRN Pham, Minh Q, RPH-CPP   50 mg at 07/21/22 1342    Allergies as of 05/26/2022 - Review Complete 05/26/2022  Allergen Reaction Noted   Beef-derived products Swelling 11/15/2021   Pork-derived products Swelling 11/15/2021   Shellfish allergy Rash 07/03/2014   Eggs or egg-derived products Swelling 12/05/2021    Other Other (See Comments) 12/05/2021    Family History  Family history unknown: Yes    Social History   Socioeconomic History   Marital status: Single    Spouse name: Not on file   Number of children: Not on file   Years of education: Not on file   Highest education level: Not on file  Occupational History   Not on file  Tobacco Use   Smoking status: Never   Smokeless tobacco: Never  Vaping Use   Vaping Use: Never used  Substance and Sexual Activity   Alcohol use: Never   Drug use: Never   Sexual activity: Not Currently  Other Topics Concern   Not on file  Social History Narrative   Not on file   Social Determinants of Health   Financial Resource Strain: Not on file  Food Insecurity: Food Insecurity Present (05/27/2022)   Hunger Vital Sign    Worried About Running Out of Food in the Last Year: Sometimes true    Ran Out of Food in the Last Year: Sometimes true  Transportation Needs: Unmet Transportation Needs (11/19/2021)   PRAPARE - Administrator, Civil Service (Medical): Yes    Lack of Transportation (Non-Medical): Yes  Physical Activity: Not on file  Stress: Not on file  Social Connections: Not on file  Intimate Partner Violence: Not At Risk (05/27/2022)   Humiliation, Afraid, Rape, and Kick questionnaire    Fear of Current or Ex-Partner: No    Emotionally Abused: No    Physically Abused: No    Sexually Abused: No    Review of Systems: Not able to offer  Physical Exam: Vital signs  in last 24 hours: Temp:  [98 F (36.7 C)-98.3 F (36.8 C)] 98.2 F (36.8 C) (11/14 1354) Pulse Rate:  [97-103] 100 (11/14 1354) Resp:  [16-17] 17 (11/14 1354) BP: (118-160)/(70-91) 138/88 (11/14 1354) SpO2:  [95 %-100 %] 100 % (11/14 1354) Weight:  [54.8 kg] 54.8 kg (11/14 0500) Last BM Date : 07/22/22 General:    Well-developed, older Asian male, sleeping, did not awaken to exam, eyes covered, in NAD Head:  Normocephalic and atraumatic. Eyes:  Sclera  clear, no icterus.   Conjunctiva pink. Ears:  Normal auditory acuity. Nose:  No deformity, discharge,  or lesions.  Core track feeding tube in place Mouth:  No deformity or lesions.   Neck:  Supple; no masses or thyromegaly. Lungs:  Clear throughout to auscultation.   No wheezes, crackles, or rhonchi. Heart:  Regular rate and rhythm; no murmurs, clicks, rubs,  or gallops. Abdomen:  Soft,nontender, BS active,nonpalp mass or hsm.   Rectal: Not done Msk:  Symmetrical without gross deformities. . Pulses:  Normal pulses noted. Extremities:  Without clubbing or edema. Neurologic: Sleeping, did not awaken to exam Skin:  Intact without significant lesions or rashes..   Intake/Output from previous day: 11/13 0701 - 11/14 0700 In: 1828.5 [I.V.:1828.5] Out: 1650 [Urine:1650] Intake/Output this shift: No intake/output data recorded.  Lab Results: No results for input(s): "WBC", "HGB", "HCT", "PLT" in the last 72 hours. BMET Recent Labs    07/20/22 0640 07/21/22 0309 07/22/22 0338  NA 136 134* 136  K 4.1 3.8 4.2  CL 107 102 104  CO2 25 26 26   GLUCOSE 121* 138* 170*  BUN 6 6 6   CREATININE 0.91 0.77 0.82  CALCIUM 10.2 9.8 9.2   LFT Recent Labs    07/22/22 0338  PROT 5.8*  ALBUMIN <1.5*  AST 24  ALT 8  ALKPHOS 76  BILITOT 0.3   PT/INR No results for input(s): "LABPROT", "INR" in the last 72 hours. Hepatitis Panel No results for input(s): "HEPBSAG", "HCVAB", "HEPAIGM", "HEPBIGM" in the last 72 hours.    IMPRESSION:  #9 56 year old non-English-speaking [place] male, admitted almost 2 months ago with severe back pain, has severe spinal stenosis and had an acute T12 compression fracture.  Recommendation was to manage conservatively, he had undergone T12 kyphoplasty on 06/18/2022  #2 malnutrition, persistent poor oral intake #3 mild cognitive impairment #4 significant language barrier affecting compliance etc. #5 mild to moderate esophageal dysmotility on barium  swallow-due to not think this is responsible for his poor oral intake and is likely to be asymptomatic.  He did demonstrate significant oral pharyngeal dysphagia with inability to transfer bolus,, pharyngeal delay and reduced laryngeal movement ,and penetration with thin liquids. These changes are generally neurogenic or secondary to chronic illness with significant weakness   Plan; no specific GI intervention indicated at this time.  Would keep head of the bed elevated at least 45 degrees and upright for meals Agree with dysphagia 1 diet as suggested by speech path, Continue core track feedings until his nutrition improved significantly and he is able to take adequate oral nutrition.         Westly Hinnant  PA-C 07/22/2022, 4:04 PM

## 2022-07-23 LAB — MAGNESIUM: Magnesium: 1.8 mg/dL (ref 1.7–2.4)

## 2022-07-23 LAB — COMPREHENSIVE METABOLIC PANEL
ALT: 8 U/L (ref 0–44)
AST: 26 U/L (ref 15–41)
Albumin: <1.5 g/dL — ABNORMAL LOW (ref 3.5–5.0)
Alkaline Phosphatase: 79 U/L (ref 38–126)
Anion gap: 6 (ref 5–15)
BUN: 6 mg/dL (ref 6–20)
CO2: 26 mmol/L (ref 22–32)
Calcium: 9.1 mg/dL (ref 8.9–10.3)
Chloride: 101 mmol/L (ref 98–111)
Creatinine, Ser: 0.74 mg/dL (ref 0.61–1.24)
GFR, Estimated: >60 mL/min (ref >60)
Glucose, Bld: 140 mg/dL — ABNORMAL HIGH (ref 70–99)
Potassium: 3.8 mmol/L (ref 3.5–5.1)
Sodium: 133 mmol/L — ABNORMAL LOW (ref 135–145)
Total Bilirubin: 0.3 mg/dL (ref 0.3–1.2)
Total Protein: 6.6 g/dL (ref 6.5–8.1)

## 2022-07-23 LAB — C-REACTIVE PROTEIN: CRP: 5.9 mg/dL — ABNORMAL HIGH (ref <1.0)

## 2022-07-23 LAB — GLUCOSE, CAPILLARY
Glucose-Capillary: 148 mg/dL — ABNORMAL HIGH (ref 70–99)
Glucose-Capillary: 155 mg/dL — ABNORMAL HIGH (ref 70–99)
Glucose-Capillary: 161 mg/dL — ABNORMAL HIGH (ref 70–99)
Glucose-Capillary: 167 mg/dL — ABNORMAL HIGH (ref 70–99)
Glucose-Capillary: 171 mg/dL — ABNORMAL HIGH (ref 70–99)
Glucose-Capillary: 181 mg/dL — ABNORMAL HIGH (ref 70–99)

## 2022-07-23 LAB — SEDIMENTATION RATE: Sed Rate: 95 mm/hr — ABNORMAL HIGH (ref 0–16)

## 2022-07-23 LAB — PHOSPHORUS: Phosphorus: 4.1 mg/dL (ref 2.5–4.6)

## 2022-07-23 LAB — URIC ACID: Uric Acid, Serum: 5.2 mg/dL (ref 3.7–8.6)

## 2022-07-23 MED ORDER — MAGNESIUM SULFATE 2 GM/50ML IV SOLN
2.0000 g | Freq: Once | INTRAVENOUS | Status: AC
  Administered 2022-07-23: 2 g via INTRAVENOUS
  Filled 2022-07-23: qty 50

## 2022-07-23 MED ORDER — KETOROLAC TROMETHAMINE 15 MG/ML IJ SOLN
15.0000 mg | Freq: Two times a day (BID) | INTRAMUSCULAR | Status: AC
  Administered 2022-07-23 – 2022-07-24 (×3): 15 mg via INTRAVENOUS
  Filled 2022-07-23 (×4): qty 1

## 2022-07-23 MED ORDER — PANTOPRAZOLE SODIUM 40 MG PO TBEC
40.0000 mg | DELAYED_RELEASE_TABLET | Freq: Two times a day (BID) | ORAL | Status: DC
  Administered 2022-07-23 – 2022-08-01 (×18): 40 mg via ORAL
  Filled 2022-07-23 (×19): qty 1

## 2022-07-23 NOTE — Progress Notes (Signed)
Physical Therapy Treatment Patient Details Name: Wayne Stokes MRN: [id number] DOB: Mar 12, 1966 Today's Date: 07/23/2022   History of Present Illness 56 y.o. male presented 05/26/22 with c/o severe and progressively worsening low back pain that radiates into his left buttock and down his LLE. Imaging +acute T12 compression fracture and  L4/5 there is multifactorial severe spinal stenosis and severe bilateral foraminal stenosis. Neurosurgery consult with no acute surgical intervention needed. IR consulted for possible kyphoplasty bu twas delayed due to fever; S/p T12 KP with biopsy on 10/11. Pt coughing with water consumption 11/9 DO notified he may need SLP consult. 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    PT Comments    Pt is seen for bed exercise and repositioning, with a struggle for him to move given intake and his comfort to move legs.  Pt is very lethargic by comparison to usual that this PT has seen with cortrak on and tending to be restless, shifting with R hand elevated for edema.  Will recommend him to SNF care due to his poor tolerance for mobility, his loss of strength and low endurance for all mobility since admission.  Follow for goals of PT which are updated today.   Recommendations for follow up therapy are one component of a multi-disciplinary discharge planning process, led by the attending physician.  Recommendations may be updated based on patient status, additional functional criteria and insurance authorization.  Follow Up Recommendations  Skilled nursing-short term rehab (<3 hours/day) Can patient physically be transported by private vehicle: No   Assistance Recommended at Discharge Frequent or constant Supervision/Assistance  Patient can return home with the following A little help with walking and/or transfers;A little help with bathing/dressing/bathroom;Assistance  with cooking/housework;Direct supervision/assist for medications management;Direct supervision/assist for financial management;Assist for transportation;Help with stairs or ramp for entrance   Equipment Recommendations  BSC/3in1;Rollator (4 wheels)    Recommendations for Other Services       Precautions / Restrictions Precautions Precautions: Fall;Back Precaution Booklet Issued: No Precaution Comments: Back precautions for comfort Required Braces or Orthoses: Spinal Brace Spinal Brace: Thoracolumbosacral orthotic Restrictions Weight Bearing Restrictions: No     Mobility  Bed Mobility Overal bed mobility: Needs Assistance Bed Mobility: Rolling Rolling: Min assist              Transfers                   General transfer comment: declining to get up    Ambulation/Gait                   Stairs             Wheelchair Mobility    Modified Rankin (Stroke Patients Only)       Balance                                            Cognition Arousal/Alertness: Awake/alert Behavior During Therapy: Flat affect Overall Cognitive Status: Impaired/Different from baseline Area of Impairment: Problem solving, Awareness, Following commands, Attention                   Current Attention Level: Selective Memory: Decreased short-term memory Following Commands: Follows one step commands with increased time Safety/Judgement: Decreased awareness of deficits Awareness: Intellectual Problem Solving: Slow processing, Requires verbal  cues, Requires tactile cues General Comments: complaining of coldness, sore in legs to stretch hips        Exercises General Exercises - Lower Extremity Ankle Circles/Pumps: AAROM, 5 reps Heel Slides: AAROM, 10 reps Hip ABduction/ADduction: AAROM, 10 reps Hip Flexion/Marching: AAROM, 10 reps    General Comments        Pertinent Vitals/Pain Pain Assessment Pain Assessment: Faces Faces Pain  Scale: Hurts little more Pain Location: B hips from stiffness    Home Living                          Prior Function            PT Goals (current goals can now be found in the care plan section)      Frequency    Min 3X/week      PT Plan Current plan remains appropriate    Co-evaluation              AM-PAC PT "6 Clicks" Mobility   Outcome Measure  Help needed turning from your back to your side while in a flat bed without using bedrails?: A Little Help needed moving from lying on your back to sitting on the side of a flat bed without using bedrails?: A Lot Help needed moving to and from a bed to a chair (including a wheelchair)?: A Lot Help needed standing up from a chair using your arms (e.g., wheelchair or bedside chair)?: A Lot Help needed to walk in hospital room?: Total Help needed climbing 3-5 steps with a railing? : Total 6 Click Score: 11    End of Session   Activity Tolerance: Patient limited by fatigue;Patient limited by pain;Other (comment) (general weakness) Patient left: in bed;with call bell/phone within reach;with bed alarm set;Other (comment) Nurse Communication: Mobility status;Precautions PT Visit Diagnosis: Other abnormalities of gait and mobility (R26.89);Muscle weakness (generalized) (M62.81);Pain Pain - part of body: Hip     Time: 1200-1219 PT Time Calculation (min) (ACUTE ONLY): 19 min  Charges:  $Therapeutic Exercise: 8-22 mins         Ivar Drape 07/23/2022, 3:57 PM  Samul Dada, PT PhD Acute Rehab Dept. Number: [facility] [id number] and [facility] [phone]

## 2022-07-23 NOTE — Progress Notes (Addendum)
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  Neurosurgery and orthopedics were consulted recommended TLSO brace and pain control initially.  IR consulted for kyphoplasty, completed on 06/18/2022. There was some concern for right shoulder septic arthritis.  ID consulted, and orthopedic. IR aspirated joint with no finding suggestive of infection. Antibiotics were discontinued. Due to weight loss and poor oral intake he was seen by dietitian on 11/10 and started on core track tube feeds.      Assessment & Plan:   Principal Problem:   Pyogenic arthritis of right shoulder region [facility]) Active Problems:   Spinal stenosis of lumbar region with neurogenic claudication   T12 compression fracture (HCC)   AKI (acute kidney injury) ([place])   Hyponatremia   Acute urinary retention   Type 2 diabetes mellitus without complication, without long-term current use of insulin (HCC)   Leukocytosis   Hypertension associated with diabetes ([place])   Iron deficiency anemia   Hyperlipidemia associated with type 2 diabetes mellitus (HCC)   Mild cognitive impairment   Thrush, oral   Right shoulder pain   Discitis of lumbar region   Chronic low back pain with bilateral sciatica   Protein-calorie malnutrition, severe   Oropharyngeal dysphagia   1-Dysphagia Esophageal Dysmotility.  Poor oral Intake:  Severe Protein caloric Malnutrition:  Evaluated by GI, does not recommend endoscopy. Is more oropharyngeal dysphagia  Continue with speech swallow.  Esophagogram;  Esophageal dysmotility disorder.  On core track until oral intake and nutrition improves.   Oral thrush: Continue with nystatin  Hypoalbuminemia: Continue with tube feed  Hypomagnesemia:  Replete .   T12 compression fracture, back pain Lumbar spinal stenosis MRI negative for cord injury Neurosurgery recommended conservative measure IR was consulted and underwent kyphoplasty on 10/11  Chronic right Shoulder pain Right Wrist pain;  Right of the shoulder was concerning with septic arthritis.  ID was consulted and work-up was negative for any infection.  I believe the antibiotics were discontinued. He had aspiration of right shoulder by IR with negative synovial fluid. MRI wrist showed inflammatory arthropathy versus septic arthritis. ESR and CRP continue to be elevated We will check uric acid Discussed with Ortho Pa. They recommend wrist aspiration by IR to rule out infection. IR consulted Will add Toradol for 2 days.   Acute urinary retention: He has failed voiding trial 3 times. Continue with Flomax and Proscar  Constipation: Continue with bowel regimen    Nutrition Problem: Severe Malnutrition Etiology: acute illness    Signs/Symptoms: energy intake < or equal to 50% for > or equal to 5 days, severe fat depletion, severe muscle depletion, percent weight loss (20% wt loss in 2 months) Percent weight loss: 20 %    Interventions: Tube feeding  Estimated body mass index is 23.59 kg/m as calculated from the following:   Height as of this encounter: 5' (1.524 m).   Weight as of this encounter: 54.8 kg.   DVT prophylaxis: SCD Code Status: Full code Family Communication: care discussed with patient.  Disposition Plan:  Status is: Inpatient Remains inpatient appropriate because: dysphagia, joint pain     Consultants:  ID  Ortho  Procedures:    Antimicrobials:    Subjective: He is alert, complaints of right wrist pain, shoulder pain 10/10 not better.    Objective: Vitals:   07/22/22 0815 07/22/22 1354 07/22/22 2002 07/23/22 0457  BP: (!) 132/91 138/88 (!) 125/91 (!) 135/97  Pulse: (!) 103 100 (!) 105 (!) 105  Resp: _0 Temp: 98.3 F (36.8 C) 98.2 F (36.8 C) 98.4 F (36.9 C)   TempSrc: Oral Oral Oral   SpO2: 100% 100% 95% 97%  Weight:      Height:        Intake/Output Summary (Last 24 hours) at 07/23/2022 0750 Last data filed at 07/22/2022 1740 Gross per 24 hour  Intake --  Output 750 ml  Net -750 ml   Filed Weights   07/19/22 0500 07/21/22 0500 07/22/22 0500  Weight: 51.2 kg 51.7 kg 54.8 kg    Examination:  General exam: Appears calm and comfortable  Respiratory system: Clear to auscultation. Respiratory effort normal. Cardiovascular system: S1 & S2 heard, RRR. No JVD, murmurs, rubs, gallops or clicks. No pedal edema. Gastrointestinal system: Abdomen is nondistended, soft and nontender. No organomegaly or masses felt. Normal bowel sounds heard. Central nervous system: Alert and oriented. No focal neurological deficits. Extremities:  right hand with swelling.   Data Reviewed: I have personally reviewed following labs and imaging studies  CBC: Recent Labs  Lab 07/18/22 0338 07/19/22 0154  WBC 5.7 5.5  HGB 8.8* 9.0*  HCT 27.0* 27.3*  MCV 68.9* 69.3*  PLT 402* 568   Basic Metabolic Panel: Recent Labs  Lab 07/19/22 0154 07/19/22 1737 07/20/22 0640 07/21/22 0309 07/22/22 0338 07/23/22 0519  NA 133*  --  136 134* 136 133*  K 3.5  --  4.1 3.8 4.2 3.8  CL 97*  --  107 102 104 101  CO2 27  --  _1 GLUCOSE 104*  --  121* 138* 170* 140*  BUN 7  --  _2 CREATININE 0.96  --  0.91 0.77 0.82 0.74  CALCIUM 11.0*  --  10.2 9.8 9.2 9.1  MG 1.3* 3.0* 1.9 1.6* 1.5* 1.8  PHOS 2.4* 2.0* 1.7* 2.9 3.5 4.1   GFR: Estimated Creatinine Clearance: 72 mL/min (by C-G formula based on SCr of 0.74 mg/dL). Liver Function Tests: Recent Labs  Lab 07/20/22 0640 07/21/22 0309 07/22/22 0338  07/23/22 0519  AST  --   --  24 26  ALT  --   --  8 8  ALKPHOS  --   --  76 79  BILITOT  --   --  0.3 0.3  PROT  --   --  5.8* 6.6  ALBUMIN <1.5* <1.5* <1.5* <1.5*   No results for input(s): "LIPASE", "AMYLASE" in the last 168 hours. No results for input(s): "AMMONIA" in the last 168 hours. Coagulation Profile: No results for input(s): "INR", "PROTIME" in the last 168 hours. Cardiac Enzymes: No results for input(s): "CKTOTAL", "CKMB", "CKMBINDEX", "TROPONINI" in the last 168 hours. BNP (last 3 results) No results for input(s): "PROBNP" in the last 8760 hours. HbA1C: No results for input(s): "HGBA1C" in the last 72 hours. CBG: Recent Labs  Lab 07/22/22 1209 07/22/22 1602 07/22/22 2000 07/23/22 0007 07/23/22 0404  GLUCAP 101* 178* 98 148* 161*   Lipid Profile: No results for input(s): "CHOL", "HDL", "LDLCALC", "TRIG", "CHOLHDL", "LDLDIRECT" in the last 72 hours. Thyroid Function Tests: No results for input(s): "TSH", "T4TOTAL", "FREET4", "  T3FREE", "THYROIDAB" in the last 72 hours. Anemia Panel: No results for input(s): "VITAMINB12", "FOLATE", "FERRITIN", "TIBC", "IRON", "RETICCTPCT" in the last 72 hours. Sepsis Labs: No results for input(s): "PROCALCITON", "LATICACIDVEN" in the last 168 hours.  No results found for this or any previous visit (from the past 240 hour(s)).       Radiology Studies: DG ESOPHAGUS W SINGLE CM (SOL OR THIN BA)  Result Date: 07/21/2022 CLINICAL DATA:  Poor following function and stasis of barium seen throughout the thoracic esophagus on modified barium swallow. Request for esophagram. The study is limited secondary to patient's immobility. EXAM: ESOPHAGUS/BARIUM SWALLOW/TABLET STUDY TECHNIQUE: Single contrast examination was performed using thin liquid barium. This exam was performed by Gareth Eagle, PA-C, and was supervised and interpreted by Maurine Simmering, MD. FLUOROSCOPY: Radiation Exposure Index (as provided by the fluoroscopic device): 8.10 mGy  Kerma COMPARISON:  Modified barium swallow done July 20, 2022 FINDINGS: Limited single-contrast esophagram with nasoenteric feeding tube in place. Mild to moderate esophageal dysmotility. No gastroesophageal reflux occurred during the exam. No evidence of hiatal hernia. A 13 mm barium tablet was not given. Prior lumbar kyphoplasty noted. IMPRESSION: Limited single contrast esophagram. Mild-to-moderate esophageal dysmotility. No hiatal hernia or gastroesophageal reflux. Electronically Signed   By: Maurine Simmering M.D.   On: 07/21/2022 13:42        Scheduled Meds:  atorvastatin  40 mg Per Tube Daily   Chlorhexidine Gluconate Cloth  6 each Topical Daily   colchicine  0.3 mg Per Tube BID   sennosides  5 mL Per Tube BID   And   docusate  100 mg Per Tube BID   donepezil  5 mg Per Tube QHS   famotidine  40 mg Per Tube Daily   feeding supplement  1 Container Oral TID BM   feeding supplement  237 mL Per Tube BID BM   ferrous sulfate  300 mg Per Tube Q breakfast   finasteride  5 mg Oral Daily   gabapentin  200 mg Per Tube Q8H   lidocaine  1 patch Transdermal Q24H   lidocaine  1 patch Transdermal Q24H   magnesium gluconate  500 mg Per Tube BID   methocarbamol  500 mg Per Tube TID   metoprolol succinate  25 mg Oral Daily   mirtazapine  15 mg Per Tube QHS   morphine  15 mg Oral Q12H   nystatin  5 mL Oral QID   polyethylene glycol  17 g Per Tube BID   tamsulosin  0.4 mg Oral QPC supper   thiamine  100 mg Per Tube Daily   Continuous Infusions:  feeding supplement (OSMOLITE 1.5 CAL) 30 mL/hr at 07/20/22 1843     LOS: 58 days    Time spent: 35 minutes.     Elmarie Shiley, MD [facility]   If 7PM-7AM, please contact night-coverage [email or web address]  07/23/2022, 7:50 AM

## 2022-07-23 NOTE — Progress Notes (Signed)
Nutrition Follow-up  DOCUMENTATION CODES:   Severe malnutrition in context of acute illness/injury  INTERVENTION:  - Continue Dys 1, Nectar thick liquids.  - Continue Ensure Enlive po BID to support oral intake. Each supplement provides 350 kcal and 20 grams of protein.   Continue tube feeding via Cortrak: Osmolite 1.5 at 50 ml/h (1200 ml per day) Provides 1800 kcal, 75 gm protein, 914 ml free water daily  - Continue 185m thiamine supplementation. - Continue iron supplementation as medically appropriate.    NUTRITION DIAGNOSIS:   Severe Malnutrition related to acute illness as evidenced by energy intake < or equal to 50% for > or equal to 5 days, severe fat depletion, severe muscle depletion, percent weight loss (20% wt loss in 2 months). *ongoing  GOAL:   Patient will meet greater than or equal to 90% of their needs *Being met with tube feeds  MONITOR:   PO intake, Supplement acceptance, TF tolerance  REASON FOR ASSESSMENT:   Consult Assessment of nutrition requirement/status  ASSESSMENT:   56y.o. male admits related to evaluation of worsening lower back pain. PMH includes: T2DM, HTN, anemia, AKI. 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  11/3 Calorie count completed - pt met 10% of calorie needs and 9% of protein needs 11/10 SLP eval-downgraded to DYS 1 with nectar thickened liquids; Cortrak placed, Osm 1.5 started at 557mhr 11/12 MBS showed dysphagia, continue DYS1 with nectar thickened liquids  Patient moaning at time of visit. Family member who interprets not at bedside at initial visit nor at second visit later in the afternoon. Patient sleeping at second visit.  Spoke with RN who reports no issues tube feed tolerance but that Cortrak leaking. Does fine with tube feeds but has issues when using for medications. RD on Cortrak to assess tube as schedule  allows. There has been no documented intake of meals since 11/9. Patient noted to have not eaten breakfast or touched tray at second visit.  If intake remains very poor, patient may need long term feeding access (if within goals of care).  Most recent weight from yesterday increased from previous weights however there is a 7# weight difference in the two most recent weight so current weight status and changes difficult to assess. Will continue to monitor closely.    Medications reviewed and include: Senokot, Colace, Miralax, Iron, Remeron, Thiamine  Labs reviewed:  Na 133 HA1C 6.2 Blood glucose 98-178 x24 hours    Diet Order:   Diet Order             DIET - DYS 1 Room service appropriate? No; Fluid consistency: Nectar Thick  Diet effective now                   EDUCATION NEEDS:   Education needs have been addressed  Skin:  Skin Assessment: Reviewed RN Assessment  Last BM:  11/14  Height:  Ht Readings from Last 1 Encounters:  06/01/22 5' (1.524 m)   Weight:  Wt Readings from Last 1 Encounters:  07/22/22 54.8 kg    Ideal Body Weight:  48.2 kg  BMI:  Body mass index is 23.59 kg/m.  Estimated Nutritional Needs:  Kcal:  1600-1800 Protein:  80-95g Fluid:  >/=1.6LBelfryD, LDN For contact information, refer to [facility]

## 2022-07-23 NOTE — Congregational Nurse Program (Signed)
Hospital visit with interpreter [facility].  Patient is alert and appropriately responsive.  Moaning due to pain in both knees.  With permission from his nurse we applied BenGay which was in his room.  He had not eaten pureed meal sitting by his bed.  We were able to get him to eat a few bites.  He liked the pineapple and mashed potatoes but could not tolerate the chicken or broccoli.  He had some chicken and rice soup a friend had brought 2 days ago which he liked but because it had not been refrigerated we discarded what was remaining.  Contacted [name] caseworker [name] regarding mail from Social Security Disability.  She will stop by hospital to pick up all mail and assist patient.  Brantley Fling RN, Congregational Nurse [phone]

## 2022-07-24 ENCOUNTER — Telehealth (HOSPITAL_COMMUNITY): Payer: Self-pay | Admitting: Physician Assistant

## 2022-07-24 ENCOUNTER — Inpatient Hospital Stay (HOSPITAL_COMMUNITY): Payer: Medicaid Other

## 2022-07-24 DIAGNOSIS — M48061 Spinal stenosis, lumbar region without neurogenic claudication: Secondary | ICD-10-CM

## 2022-07-24 DIAGNOSIS — G8929 Other chronic pain: Secondary | ICD-10-CM

## 2022-07-24 DIAGNOSIS — R339 Retention of urine, unspecified: Secondary | ICD-10-CM

## 2022-07-24 DIAGNOSIS — E871 Hypo-osmolality and hyponatremia: Secondary | ICD-10-CM

## 2022-07-24 LAB — SYNOVIAL CELL COUNT + DIFF, W/ CRYSTALS
Crystals, Fluid: NONE SEEN
Eosinophils-Synovial: 5 % — ABNORMAL HIGH (ref 0–1)
Lymphocytes-Synovial Fld: 10 % (ref 0–20)
Monocyte-Macrophage-Synovial Fluid: 49 % — ABNORMAL LOW (ref 50–90)
Neutrophil, Synovial: 36 % — ABNORMAL HIGH (ref 0–25)
WBC, Synovial: 93 /mm3 (ref 0–200)

## 2022-07-24 LAB — COMPREHENSIVE METABOLIC PANEL
ALT: 7 U/L (ref 0–44)
AST: 23 U/L (ref 15–41)
Albumin: <1.5 g/dL — ABNORMAL LOW (ref 3.5–5.0)
Alkaline Phosphatase: 76 U/L (ref 38–126)
Anion gap: 8 (ref 5–15)
BUN: 10 mg/dL (ref 6–20)
CO2: 27 mmol/L (ref 22–32)
Calcium: 9.4 mg/dL (ref 8.9–10.3)
Chloride: 98 mmol/L (ref 98–111)
Creatinine, Ser: 0.81 mg/dL (ref 0.61–1.24)
GFR, Estimated: >60 mL/min (ref >60)
Glucose, Bld: 144 mg/dL — ABNORMAL HIGH (ref 70–99)
Potassium: 4.3 mmol/L (ref 3.5–5.1)
Sodium: 133 mmol/L — ABNORMAL LOW (ref 135–145)
Total Bilirubin: 0.3 mg/dL (ref 0.3–1.2)
Total Protein: 6.1 g/dL — ABNORMAL LOW (ref 6.5–8.1)

## 2022-07-24 LAB — CBC
HCT: 25 % — ABNORMAL LOW (ref 39.0–52.0)
Hemoglobin: 8 g/dL — ABNORMAL LOW (ref 13.0–17.0)
MCH: 22.3 pg — ABNORMAL LOW (ref 26.0–34.0)
MCHC: 32 g/dL (ref 30.0–36.0)
MCV: 69.6 fL — ABNORMAL LOW (ref 80.0–100.0)
Platelets: 337 10*3/uL (ref 150–400)
RBC: 3.59 MIL/uL — ABNORMAL LOW (ref 4.22–5.81)
RDW: 22.9 % — ABNORMAL HIGH (ref 11.5–15.5)
WBC: 5.9 10*3/uL (ref 4.0–10.5)
nRBC: 0 % (ref 0.0–0.2)

## 2022-07-24 LAB — GLUCOSE, CAPILLARY
Glucose-Capillary: 109 mg/dL — ABNORMAL HIGH (ref 70–99)
Glucose-Capillary: 139 mg/dL — ABNORMAL HIGH (ref 70–99)
Glucose-Capillary: 153 mg/dL — ABNORMAL HIGH (ref 70–99)
Glucose-Capillary: 181 mg/dL — ABNORMAL HIGH (ref 70–99)

## 2022-07-24 LAB — PHOSPHORUS: Phosphorus: 4.9 mg/dL — ABNORMAL HIGH (ref 2.5–4.6)

## 2022-07-24 LAB — MAGNESIUM: Magnesium: 2.3 mg/dL (ref 1.7–2.4)

## 2022-07-24 LAB — ANA W/REFLEX IF POSITIVE: Anti Nuclear Antibody (ANA): NEGATIVE

## 2022-07-24 MED ORDER — SODIUM CHLORIDE (PF) 0.9 % IJ SOLN
3.0000 mL | Freq: Once | INTRAMUSCULAR | Status: DC
  Filled 2022-07-24: qty 10

## 2022-07-24 MED ORDER — LIDOCAINE HCL (PF) 1 % IJ SOLN
5.0000 mL | Freq: Once | INTRAMUSCULAR | Status: AC
  Administered 2022-07-24: 5 mL via INTRADERMAL
  Filled 2022-07-24: qty 5

## 2022-07-24 MED ORDER — IOHEXOL 300 MG/ML  SOLN
2.0000 mL | Freq: Once | INTRAMUSCULAR | Status: AC | PRN
  Administered 2022-07-24: 2 mL via INTRA_ARTICULAR

## 2022-07-24 NOTE — Plan of Care (Signed)
  Problem: Coping: Goal: Level of anxiety will decrease Outcome: Progressing   Problem: Pain Managment: Goal: General experience of comfort will improve Outcome: Progressing   Problem: Safety: Goal: Ability to remain free from injury will improve Outcome: Progressing   Problem: Skin Integrity: Goal: Risk for impaired skin integrity will decrease Outcome: Progressing   

## 2022-07-24 NOTE — TOC Progression Note (Signed)
Transition of Care [facility]) - Progression Note    Patient Details  Name: Hill Mackie MRN: [id number] Date of Birth: 1966-03-25  [facility] of [facility] CM/SW Contact  Joanne Chars, LCSW Phone Number: 07/24/2022, 1:03 PM  Clinical Narrative:   CSW spoke with Kristal/[facility], updated her that pt still not ready for DC, could be ready in several more days.  She said the bed will be available whenever pt is ready.  CSW met with pt and interpretor. CSW shared with pt that [facility] SNF has accepted him for admission and that they will accept him once the MD has said he can DC.  CSW wrote name, address, phone number for [facility] and provided to pt.  Per interpretor, pt acknowledged, did not have questions.    Expected Discharge Plan: Skilled Nursing Facility Barriers to Discharge: Continued Medical Work up  Expected Discharge Plan and Services Expected Discharge Plan: [place] In-house Referral: Development worker, community Discharge Planning Services: CM Consult                                           Social Determinants of Health (SDOH) Interventions Food Insecurity Interventions: Inpatient TOC Transportation Interventions: Inpatient TOC  Readmission Risk Interventions     No data to display

## 2022-07-24 NOTE — Progress Notes (Signed)
OT Cancellation Note  Patient Details Name: Wayne Stokes MRN: [id number] DOB: 04/06/1966   Cancelled Treatment:    Reason Eval/Treat Not Completed: Patient declined, no reason specified (Patient lethargic from recent return from x-ray.  Will attempt later today if schedule permits.) [name], OTA Acute Rehabilitation Services  Office [phone]  [name] 07/24/2022, 3:00 PM

## 2022-07-24 NOTE — Progress Notes (Signed)
Mobility Specialist: Progress Note   07/24/22 1734  Mobility  Activity Dangled on edge of bed  Level of Assistance Moderate assist, patient does 50-74%  Assistive Device None  Activity Response Tolerated fair  Mobility Referral Yes  $Mobility charge 1 Mobility   Pt received in the bed bowel incontinent and assisted with pericare with help from NT, minA to roll. Pt agreeable to mobility and required modA to sit EOB. Pt refusing standing attempt despite encouragement secondary to pain. Pt sat EOB for 3 minutes and minA to get back in bed. Bed alarm is on and call bell is at his side.   Kaleigha Chamberlin Mobility Specialist Please contact via SecureChat or Rehab office at [phone]

## 2022-07-24 NOTE — Progress Notes (Signed)
Patient's cortrak tube has been intermittently leaking from the connector. Able to flush with sterile water but still be leaking from the connector. RN tried to contact cortrak tube thru amnion but no service at this time.

## 2022-07-24 NOTE — Progress Notes (Signed)
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  Neurosurgery and orthopedics were consulted recommended TLSO brace and pain control initially.  IR consulted for kyphoplasty, completed on 06/18/2022. There was some concern for right shoulder septic arthritis.  ID consulted, and orthopedic. IR aspirated joint with no finding suggestive of infection. Antibiotics were discontinued. Due to weight loss and poor oral intake he was seen by dietitian on 11/10 and started on core track tube feeds.    Assessment & Plan:   Principal Problem:   Pyogenic arthritis of right shoulder region [facility]) Active Problems:   Spinal stenosis of lumbar region with neurogenic claudication   T12 compression fracture (HCC)   AKI (acute kidney injury) ([place])   Hyponatremia   Acute urinary retention   Type 2 diabetes mellitus without complication, without long-term current use of insulin (HCC)   Leukocytosis   Hypertension associated with diabetes ([place])   Iron deficiency anemia   Hyperlipidemia associated with type 2 diabetes mellitus (HCC)   Mild cognitive impairment   Thrush, oral   Right shoulder pain   Discitis of lumbar region   Chronic low back pain with bilateral sciatica   Protein-calorie malnutrition, severe   Oropharyngeal dysphagia   1-Dysphagia Esophageal Dysmotility.  Poor oral Intake:  Severe Protein caloric Malnutrition:  Evaluated by GI, does not recommend endoscopy. Is more oropharyngeal dysphagia  Continue with speech swallow.  Esophagogram; Esophageal  dysmotility disorder.  On core track until oral intake and nutrition improves.  Encourage oral intake.   Oral thrush: Continue with nystatin.  Hypoalbuminemia: Continue with tube feed  Hypomagnesemia:  Replaced.   T12 compression fracture, back pain Lumbar spinal stenosis MRI negative for cord injury Neurosurgery recommended conservative measure IR was consulted and underwent kyphoplasty on 10/11  Chronic right Shoulder pain Right Wrist pain;  Right of the shoulder was concerning with septic arthritis.  ID was consulted and work-up was negative for any infection.  I believe the antibiotics were discontinued. He had aspiration of right shoulder by IR with negative synovial fluid. MRI wrist showed inflammatory arthropathy versus septic arthritis. ESR and CRP continue to be elevated uric acid normal.  Discussed with Ortho Pa. They recommend wrist aspiration by IR to rule out infection. IR consulted Started Toradol for 2 days.  If synovial fluid negative for infection, will give trial of steroids.   Acute urinary retention: He has failed voiding trial 3 times. Continue with Flomax and Proscar  Constipation: Continue with bowel regimen    Nutrition Problem: Severe Malnutrition Etiology: acute illness    Signs/Symptoms: energy intake < or equal to 50% for > or equal to 5 days, severe fat depletion, severe muscle depletion, percent weight loss (20% wt loss in 2 months) Percent weight loss: 20 %    Interventions: Tube feeding  Estimated body mass index is 23.59 kg/m as calculated from the following:   Height as of this encounter: 5' (1.524 m).   Weight as of this encounter: 54.8 kg.   DVT prophylaxis: SCD Code Status: Full code Family Communication: care discussed with patient.  Disposition Plan:  Status is: Inpatient Remains inpatient appropriate  because: dysphagia, joint pain     Consultants:  ID Ortho  Procedures:    Antimicrobials:     Subjective: Nice at bedside, assisted with translation. He is still having right hand pain and shoulder pain.   Objective: Vitals:   07/23/22 0457 07/23/22 0817 07/23/22 1354 07/23/22 2100  BP: (!) 135/97 121/87 136/82 125/85  Pulse: (!) 105 (!) 107 100 100  Resp: _0 Temp:  98.9 F (37.2 C) 98.6 F (37 C) 97.9 F (36.6 C)  TempSrc:  Oral Oral Oral  SpO2: 97% 96% 99% 100%  Weight:      Height:        Intake/Output Summary (Last 24 hours) at 07/24/2022 1416 Last data filed at 07/24/2022 1053 Gross per 24 hour  Intake --  Output 550 ml  Net -550 ml    Filed Weights   07/19/22 0500 07/21/22 0500 07/22/22 0500  Weight: 51.2 kg 51.7 kg 54.8 kg    Examination:  General exam: NAD Respiratory system:  CTA Cardiovascular system: S 1, S 2 RRR Gastrointestinal system: BS present, soft, nt Central nervous system: alert Extremities:  Right hand with swelling.   Data Reviewed: I have personally reviewed following labs and imaging studies  CBC: Recent Labs  Lab 07/18/22 0338 07/19/22 0154 07/24/22 0252  WBC 5.7 5.5 5.9  HGB 8.8* 9.0* 8.0*  HCT 27.0* 27.3* 25.0*  MCV 68.9* 69.3* 69.6*  PLT 402* 377 283    Basic Metabolic Panel: Recent Labs  Lab 07/20/22 0640 07/21/22 0309 07/22/22 0338 07/23/22 0519 07/24/22 0252  NA 136 134* 136 133* 133*  K 4.1 3.8 4.2 3.8 4.3  CL 107 102 104 101 98  CO2 _1 GLUCOSE 121* 138* 170* 140* 144*  BUN _2 CREATININE 0.91 0.77 0.82 0.74 0.81  CALCIUM 10.2 9.8 9.2 9.1 9.4  MG 1.9 1.6* 1.5* 1.8 2.3  PHOS 1.7* 2.9 3.5 4.1 4.9*    GFR: Estimated Creatinine Clearance: 71.2 mL/min (by C-G formula based on SCr of 0.81 mg/dL). Liver Function Tests: Recent Labs  Lab 07/20/22 0640 07/21/22 0309 07/22/22 0338 07/23/22 0519 07/24/22 0252  AST  --   --  _3 ALT  --   --  _4 ALKPHOS  --   --  76 79 76  BILITOT  --   --  0.3 0.3 0.3  PROT  --   --  5.8* 6.6 6.1*  ALBUMIN <1.5* <1.5*  <1.5* <1.5* <1.5*    No results for input(s): "LIPASE", "AMYLASE" in the last 168 hours. No results for input(s): "AMMONIA" in the last 168 hours. Coagulation Profile: No results for input(s): "INR", "PROTIME" in the last 168 hours. Cardiac Enzymes: No results for input(s): "CKTOTAL", "CKMB", "CKMBINDEX", "TROPONINI" in the last 168 hours. BNP (last 3 results) No results for input(s): "PROBNP" in the last 8760 hours. HbA1C: No results for input(s): "HGBA1C" in the last 72 hours. CBG: Recent Labs  Lab 07/23/22 1156 07/23/22 1605 07/23/22 1925 07/24/22 0805 07/24/22 1154  GLUCAP 155* 171* 181* 109* 139*    Lipid Profile: No results for input(s): "CHOL", "HDL", "LDLCALC", "TRIG", "CHOLHDL", "LDLDIRECT" in the last 72 hours. Thyroid Function Tests: No results for input(s): "TSH", "T4TOTAL", "FREET4", "T3FREE", "THYROIDAB" in the last 72 hours. Anemia Panel: No results for input(s): "VITAMINB12", "FOLATE", "FERRITIN", "TIBC", "IRON", "RETICCTPCT" in the last 72 hours. Sepsis Labs: No results for input(s): "PROCALCITON", "LATICACIDVEN"  in the last 168 hours.  No results found for this or any previous visit (from the past 240 hour(s)).       Radiology Studies: No results found.      Scheduled Meds:  atorvastatin  40 mg Per Tube Daily   Chlorhexidine Gluconate Cloth  6 each Topical Daily   colchicine  0.3 mg Per Tube BID   sennosides  5 mL Per Tube BID   And   docusate  100 mg Per Tube BID   donepezil  5 mg Per Tube QHS   famotidine  40 mg Per Tube Daily   feeding supplement  237 mL Per Tube BID BM   ferrous sulfate  300 mg Per Tube Q breakfast   finasteride  5 mg Oral Daily   gabapentin  200 mg Per Tube Q8H   ketorolac  15 mg Intravenous BID   lidocaine  1 patch Transdermal Q24H   lidocaine  1 patch Transdermal Q24H   lidocaine (PF)  5 mL Intradermal Once   magnesium gluconate  500 mg Per Tube BID   methocarbamol  500 mg Per Tube TID   metoprolol succinate   25 mg Oral Daily   mirtazapine  15 mg Per Tube QHS   morphine  15 mg Oral Q12H   nystatin  5 mL Oral QID   pantoprazole  40 mg Oral BID   polyethylene glycol  17 g Per Tube BID   tamsulosin  0.4 mg Oral QPC supper   thiamine  100 mg Per Tube Daily   Continuous Infusions:  feeding supplement (OSMOLITE 1.5 CAL) 1,000 mL (07/23/22 1752)     LOS: 59 days    Time spent: 35 minutes.     Elmarie Shiley, MD [facility]   If 7PM-7AM, please contact night-coverage [email or web address]  07/24/2022, 2:16 PM

## 2022-07-25 LAB — GLUCOSE, CAPILLARY
Glucose-Capillary: 116 mg/dL — ABNORMAL HIGH (ref 70–99)
Glucose-Capillary: 137 mg/dL — ABNORMAL HIGH (ref 70–99)
Glucose-Capillary: 178 mg/dL — ABNORMAL HIGH (ref 70–99)
Glucose-Capillary: 200 mg/dL — ABNORMAL HIGH (ref 70–99)
Glucose-Capillary: 205 mg/dL — ABNORMAL HIGH (ref 70–99)
Glucose-Capillary: 221 mg/dL — ABNORMAL HIGH (ref 70–99)
Glucose-Capillary: 266 mg/dL — ABNORMAL HIGH (ref 70–99)
Glucose-Capillary: 279 mg/dL — ABNORMAL HIGH (ref 70–99)

## 2022-07-25 MED ORDER — METOPROLOL SUCCINATE ER 25 MG PO TB24
12.5000 mg | ORAL_TABLET | Freq: Every day | ORAL | Status: DC
  Administered 2022-07-26 – 2022-08-01 (×7): 12.5 mg via ORAL
  Filled 2022-07-25 (×7): qty 1

## 2022-07-25 MED ORDER — PREDNISONE 20 MG PO TABS
60.0000 mg | ORAL_TABLET | Freq: Every day | ORAL | Status: DC
  Administered 2022-07-25 – 2022-07-27 (×3): 60 mg via ORAL
  Filled 2022-07-25 (×3): qty 3

## 2022-07-25 MED ORDER — SODIUM CHLORIDE 0.9 % IV BOLUS
500.0000 mL | Freq: Once | INTRAVENOUS | Status: AC
  Administered 2022-07-25: 500 mL via INTRAVENOUS

## 2022-07-25 MED ORDER — INSULIN ASPART 100 UNIT/ML IJ SOLN
0.0000 [IU] | Freq: Three times a day (TID) | INTRAMUSCULAR | Status: DC
  Administered 2022-07-25: 2 [IU] via SUBCUTANEOUS
  Administered 2022-07-26: 3 [IU] via SUBCUTANEOUS
  Administered 2022-07-26 (×2): 2 [IU] via SUBCUTANEOUS
  Administered 2022-07-27: 1 [IU] via SUBCUTANEOUS
  Administered 2022-07-27: 3 [IU] via SUBCUTANEOUS
  Administered 2022-07-28 – 2022-07-29 (×2): 2 [IU] via SUBCUTANEOUS
  Administered 2022-07-29 – 2022-07-31 (×4): 1 [IU] via SUBCUTANEOUS
  Administered 2022-07-31: 2 [IU] via SUBCUTANEOUS

## 2022-07-25 NOTE — Progress Notes (Signed)
Physical Therapy Treatment Patient Details Name: Wayne Stokes MRN: [id number] DOB: 11-Sep-1965 Today's Date: 07/25/2022   History of Present Illness 56 y.o. male presented 05/26/22 with c/o severe and progressively worsening low back pain that radiates into his left buttock and down his LLE. Imaging +acute T12 compression fracture and  L4/5 there is multifactorial severe spinal stenosis and severe bilateral foraminal stenosis. Neurosurgery consult with no acute surgical intervention needed. IR consulted for possible kyphoplasty bu twas delayed due to fever; S/p T12 KP with biopsy on 10/11. Pt coughing with water consumption 11/9 DO notified he may need SLP consult. 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    PT Comments    Pt received in supine, agreeable to therapy session and with good participation and fair tolerance for transfers, seated LE exercises and gait training. Pt noted to be in soiled bed upon PTA arrival to room but had not notified staff. Pt needing mod to maxA for transfers and gait with RW and c/o minimal to no pain at rest but moderate to severe pain during mobility. Pt continues to benefit from PT services to progress toward functional mobility goals.    Recommendations for follow up therapy are one component of a multi-disciplinary discharge planning process, led by the attending physician.  Recommendations may be updated based on patient status, additional functional criteria and insurance authorization.  Follow Up Recommendations  Skilled nursing-short term rehab (<3 hours/day) Can patient physically be transported by private vehicle: No   Assistance Recommended at Discharge Frequent or constant Supervision/Assistance  Patient can return home with the following A little help with bathing/dressing/bathroom;Assistance with cooking/housework;Direct supervision/assist for  medications management;Direct supervision/assist for financial management;Assist for transportation;Help with stairs or ramp for entrance;A lot of help with walking and/or transfers   Equipment Recommendations  BSC/3in1;Rollator (4 wheels)    Recommendations for Other Services       Precautions / Restrictions Precautions Precautions: Fall;Back Precaution Booklet Issued: No Precaution Comments: Back precautions for comfort Required Braces or Orthoses: Spinal Brace Spinal Brace: Thoracolumbosacral orthotic Restrictions Weight Bearing Restrictions: No     Mobility  Bed Mobility Overal bed mobility: Needs Assistance Bed Mobility: Rolling, Sidelying to Sit Rolling: Min assist Sidelying to sit: Mod assist       General bed mobility comments: increased time and multimodal cues to initiate and sequence for log rolling; modA for trunk raising    Transfers Overall transfer level: Needs assistance Equipment used: Rolling walker (2 wheels) Transfers: Sit to/from Stand Sit to Stand: Mod assist, Max assist   Step pivot transfers: Mod assist       General transfer comment: pt holding RW too far advanced while pivoting; modA initially progressing to maxA with stand>sit    Ambulation/Gait Ambulation/Gait assistance: Max assist, Mod assist Gait Distance (Feet): 25 Feet Assistive device: Rolling walker (2 wheels) Gait Pattern/deviations: Step-through pattern, Decreased stride length, Wide base of support, Trunk flexed       General Gait Details: pt holding RW too far advanced and flexed trunk; poor carryover of multimodal cues for posture/RW use; translator not present as pt had needed to be cleaned up and did not have time to call them first.   Stairs             Wheelchair Mobility    Modified Rankin (Stroke Patients Only)       Balance Overall balance assessment: Needs assistance Sitting-balance support: Feet supported,  No upper extremity supported Sitting  balance-Leahy Scale: Fair Sitting balance - Comments: able to maintain sitting balance while donning back brace   Standing balance support: During functional activity, Bilateral upper extremity supported, Reliant on assistive device for balance Standing balance-Leahy Scale: Poor Standing balance comment: Reliant on RW and external assist                            Cognition Arousal/Alertness: Awake/alert Behavior During Therapy: Flat affect Overall Cognitive Status: Impaired/Different from baseline Area of Impairment: Safety/judgement, Following commands, Problem solving, Memory                 Orientation Level: Disoriented to, Time, Situation   Memory: Decreased recall of precautions, Decreased short-term memory Following Commands: Follows multi-step commands inconsistently, Follows one step commands inconsistently Safety/Judgement: Decreased awareness of safety, Decreased awareness of deficits Awareness: Intellectual Problem Solving: Slow processing, Requires verbal cues, Decreased initiation, Difficulty sequencing General Comments: Pt moaning throughout with mobility but had difficulty indicating where pain was worst. Nodding yes to therapist pointing at different joints. Appears fatigued; pt had soiled bed prior to arrival of PTA but had not notified staff.        Exercises General Exercises - Lower Extremity Long Arc Quad: AROM, Both, 10 reps, AAROM, Seated Hip Flexion/Marching: AROM, Both, Seated, 15 reps    General Comments General comments (skin integrity, edema, etc.): BP 99/65 (77) HR 111 SpO2 95% seated in recliner post-exertion; BP 105/80 taken prior to session      Pertinent Vitals/Pain Pain Assessment Pain Assessment: PAINAD Breathing: normal Negative Vocalization: occasional moan/groan, low speech, negative/disapproving quality Facial Expression: facial grimacing Body Language: relaxed Consolability: no need to console PAINAD Score: 3 Pain  Location: "all over" Pain Descriptors / Indicators: Moaning, Grimacing, Sore Pain Intervention(s): Limited activity within patient's tolerance, Monitored during session, Repositioned, Patient requesting pain meds-RN notified     PT Goals (current goals can now be found in the care plan section) Acute Rehab PT Goals Patient Stated Goal: decreased pain PT Goal Formulation: With patient Time For Goal Achievement: 07/22/22 Progress towards PT goals: Progressing toward goals    Frequency    Min 3X/week      PT Plan Current plan remains appropriate       AM-PAC PT "6 Clicks" Mobility   Outcome Measure  Help needed turning from your back to your side while in a flat bed without using bedrails?: A Little Help needed moving from lying on your back to sitting on the side of a flat bed without using bedrails?: A Lot Help needed moving to and from a bed to a chair (including a wheelchair)?: A Lot Help needed standing up from a chair using your arms (e.g., wheelchair or bedside chair)?: A Lot Help needed to walk in hospital room?: A Lot Help needed climbing 3-5 steps with a railing? : Total 6 Click Score: 12    End of Session Equipment Utilized During Treatment: Gait belt;Back brace Activity Tolerance: Patient tolerated treatment well;Patient limited by fatigue;Patient limited by pain;Other (comment) (back pain) Patient left: in chair;with call bell/phone within reach;with chair alarm set Nurse Communication: Mobility status;Precautions;Other (comment) (needs new foam dressing, the one on his bottom was soiled) PT Visit Diagnosis: Other abnormalities of gait and mobility (R26.89);Muscle weakness (generalized) (M62.81);Pain Pain - part of body:  (B knees, back, shoulders)     Time: 6270-3500 PT Time Calculation (min) (ACUTE ONLY): 26 min  Charges:  $  Gait Training: 8-22 mins $Therapeutic Activity: 8-22 mins                     Deaundra Kutzer P., PTA Acute Rehabilitation Services Secure  Chat Preferred 9a-5:30pm Office: [phone]    Dorathy Kinsman [facility] 07/25/2022, 4:41 PM

## 2022-07-25 NOTE — Progress Notes (Signed)
Speech Language Pathology Treatment: Dysphagia  Patient Details Name: Wayne Stokes MRN: [id number] DOB: [birth year] Today's Date: 07/25/2022 Time: 7893-8101 SLP Time Calculation (min) (ACUTE ONLY): 10 min  Assessment / Plan / Recommendation Clinical Impression  Pt seen for ongoing dysphagia management.  GI without specific recommendations to address mild-mod esophageal dysmotility noted on esophagram.  Pt continues to have very poor PO intake. Today pt accepted trial of diced peaches x1, 1 bite of banana, and one sip of water by cup.  There were no clinical s/s of aspiration with these trials.  Given limited intake of trials today compensatory strategy training was deferred. MBS 10/12 showed cup sips of thin liquid prevented penetration; pt may advance to thin liquids by cup as compensatory chin tuck has not been effectively implemented during this week's sessions. Reviewed Sunday's MBS imaging.  Penetration of thin liquid occurs after the swallow from spillover of pyriform sinus residue.  Residuals seemingly secondary to reduced duration of UES opening, although presence of cortrak may also be a contributing factor.  Pt complaining of esophageal symptoms during previous session, and esophageal stasis observed on MBS may be contributed to decreased UES opening.  Oral and pharyngeal deficits are relatively mild and do not appear to be responsible for pt's decreased PO intake. Pt will likely continue to require supplement tube feeds to meet nutritional needs.  Will advance diet to mechanical soft solids with thin liquid by cup sip only, no straws.      HPI HPI: Wayne Stokes is an 56 y.o. male from [place] who speaks [place] and was admitted for uncontrolled back pain and severe spinal lumbar stenosis orthopedic, neurosurgery evaluated the patient and recommended TLC L for pain control and outpatient follow-up.  Physical therapy recommended inpatient rehab difficult to place due to lack of insurance.  IR was  consulted to perform kyphoplasty of T12 compression fracture on 06/18/2022. CT neck showed Moderate degenerative disc disease is noted at C4-5 and  C5-6.  Pt reprots difficulty swallowing, has severe weight loss  Esophagram 11/13: "Limited single contrast esophagram. Mild-to-moderate esophageal  dysmotility. No hiatal hernia or gastroesophageal reflux."  PMH: diabetes mellitus type 2, essential hypertension with lumbar disc disease with spinal stenosis, T12 compression fracture and mild cognitive impairment      SLP Plan  Continue with current plan of care      Recommendations for follow up therapy are one component of a multi-disciplinary discharge planning process, led by the attending physician.  Recommendations may be updated based on patient status, additional functional criteria and insurance authorization.    Recommendations  Diet recommendations:  (Could advance up to mechanica soft solids with thin liquid by cup at MD discretion.) Liquids provided via: No straw;Cup (if thin. No additional precautions required with NTL) Medication Administration: Crushed with puree Compensations: Slow rate;Small sips/bites Postural Changes and/or Swallow Maneuvers: Seated upright 90 degrees;Upright 30-60 min after meal                Oral Care Recommendations: Oral care BID Follow Up Recommendations: Skilled nursing-short term rehab (<3 hours/day) Assistance recommended at discharge: Intermittent Supervision/Assistance SLP Visit Diagnosis: Dysphagia, oropharyngeal phase (R13.12);Dysphagia, pharyngoesophageal phase (R13.14) Plan: Continue with current plan of care           Kerrie Pleasure, MA, CCC-SLP Acute Rehabilitation Services Office: [phone] 07/25/2022, 12:05 PM

## 2022-07-25 NOTE — Progress Notes (Signed)
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  Neurosurgery and orthopedics were consulted recommended TLSO brace and pain control initially.  IR consulted for kyphoplasty, completed on 06/18/2022. There was some concern for right shoulder septic arthritis.  ID consulted, and orthopedic. IR aspirated joint with no finding suggestive of infection. Antibiotics were discontinued. Due to weight loss and poor oral intake he was seen by dietitian on 11/10 and started on core track tube feeds.    Assessment & Plan:   Principal Problem:   Pyogenic arthritis of right shoulder region (HCC) Active Problems:   Spinal stenosis of lumbar region with neurogenic claudication   T12 compression fracture (HCC)   AKI (acute kidney injury) (HCC)   Hyponatremia   Acute urinary retention   Type 2 diabetes mellitus without complication, without long-term current use of insulin (HCC)   Leukocytosis   Hypertension associated with diabetes (HCC)   Iron deficiency anemia   Hyperlipidemia associated with type 2 diabetes mellitus (HCC)   Mild cognitive impairment   Thrush, oral   Right shoulder pain   Discitis of lumbar region   Chronic low back pain with bilateral sciatica   Protein-calorie malnutrition, severe   Oropharyngeal dysphagia   1-Dysphagia Esophageal Dysmotility.  Poor oral Intake:  Severe Protein caloric Malnutrition:  Evaluated by GI, does not recommend endoscopy. Is more oropharyngeal dysphagia  Continue with speech swallow.  Esophagogram; Esophageal  dysmotility disorder.  On core track until oral intake and nutrition improves.  Encourage oral intake.  Plan to advanced diet to mechanical soft, to see if patient would eat more.   Oral thrush: Continue with nystatin.  Hypoalbuminemia: Continue with tube feed  Hypomagnesemia:  Replaced.   T12 compression fracture, back pain Lumbar spinal stenosis MRI negative for cord injury Neurosurgery recommended conservative measure IR was consulted and underwent kyphoplasty on 10/11  Chronic right Shoulder pain Right Wrist pain;  Right of the shoulder was concerning with septic arthritis.  ID was consulted and work-up was negative for any infection.  I believe the antibiotics were discontinued. He had aspiration of right shoulder by IR with negative synovial fluid. MRI wrist showed inflammatory arthropathy versus septic arthritis. ESR and CRP continue to be elevated uric acid normal.  Discussed with Ortho Pa. They recommend wrist aspiration by IR to rule out infection. IR consulted Received Toradol for 2 days.  Synovial fluid; No significant WBC, no growth in 24 hours.  Will give trial prednisone.  RF pending.   Acute urinary retention: He has failed voiding trial 3 times. Continue with Flomax and Proscar  Constipation: Continue with bowel regimen    Nutrition Problem: Severe Malnutrition Etiology: acute illness    Signs/Symptoms: energy intake < or equal to 50% for > or equal to 5 days, severe fat depletion, severe muscle depletion, percent weight loss (20% wt loss in 2 months) Percent weight loss: 20 %    Interventions: Tube feeding  Estimated body mass index is 23.59 kg/m as calculated from the following:   Height as of this encounter: 5' (1.524 m).   Weight as of this encounter: 54.8 kg.   DVT   prophylaxis: SCD Code Status: Full code Family Communication: Care discussed with patient.  Disposition Plan:  Status is: Inpatient Remains inpatient appropriate because:  dysphagia, joint pain     Consultants:  ID Ortho  Procedures:    Antimicrobials:    Subjective: He is more calm today. Wrist pain better.   Objective: Vitals:   07/25/22 0049 07/25/22 0150 07/25/22 0553 07/25/22 0724  BP: 92/67 91/79 107/80 92/74  Pulse: (!) 108 (!) 107 (!) 117 (!) 110  Resp:      Temp:   99 F (37.2 C) 97.9 F (36.6 C)  TempSrc:   Oral Oral  SpO2:   96% 96%  Weight:      Height:        Intake/Output Summary (Last 24 hours) at 07/25/2022 1326 Last data filed at 07/24/2022 2310 Gross per 24 hour  Intake 240 ml  Output 250 ml  Net -10 ml    Filed Weights   07/19/22 0500 07/21/22 0500 07/22/22 0500  Weight: 51.2 kg 51.7 kg 54.8 kg    Examination:  General exam: NAD Respiratory system: CTA Cardiovascular system: S 1, S 2 RRR Gastrointestinal system: BS present, soft, nt Central nervous system:Alert, follows command Extremities:  Right hand with swelling.   Data Reviewed: I have personally reviewed following labs and imaging studies  CBC: Recent Labs  Lab 07/19/22 0154 07/24/22 0252  WBC 5.5 5.9  HGB 9.0* 8.0*  HCT 27.3* 25.0*  MCV 69.3* 69.6*  PLT 377 161    Basic Metabolic Panel: Recent Labs  Lab 07/20/22 0640 07/21/22 0309 07/22/22 0338 07/23/22 0519 07/24/22 0252  NA 136 134* 136 133* 133*  K 4.1 3.8 4.2 3.8 4.3  CL 107 102 104 101 98  CO2 _0 GLUCOSE 121* 138* 170* 140* 144*  BUN _1 CREATININE 0.91 0.77 0.82 0.74 0.81  CALCIUM 10.2 9.8 9.2 9.1 9.4  MG 1.9 1.6* 1.5* 1.8 2.3  PHOS 1.7* 2.9 3.5 4.1 4.9*    GFR: Estimated Creatinine Clearance: 71.2 mL/min (by C-G formula based on SCr of 0.81 mg/dL). Liver Function Tests: Recent Labs  Lab 07/20/22 0640 07/21/22 0309 07/22/22 0338 07/23/22 0519 07/24/22 0252  AST  --   --  _2 ALT  --   --  _3 ALKPHOS  --   --  76 79 76  BILITOT  --   --  0.3 0.3 0.3  PROT  --   --  5.8* 6.6 6.1*  ALBUMIN <1.5* <1.5* <1.5* <1.5* <1.5*     No results for input(s): "LIPASE", "AMYLASE" in the last 168 hours. No results for input(s): "AMMONIA" in the last 168 hours. Coagulation Profile: No results for input(s): "INR", "PROTIME" in the last 168 hours. Cardiac Enzymes: No results for input(s): "CKTOTAL", "CKMB", "CKMBINDEX", "TROPONINI" in the last 168 hours. BNP (last 3 results) No results for input(s): "PROBNP" in the last 8760 hours. HbA1C: No results for input(s): "HGBA1C" in the last 72 hours. CBG: Recent Labs  Lab 07/24/22 2020 07/25/22 0004 07/25/22 0346 07/25/22 0727 07/25/22 1244  GLUCAP 181* 205* 200* 178* 116*    Lipid Profile: No results for input(s): "CHOL", "HDL", "LDLCALC", "TRIG", "CHOLHDL", "LDLDIRECT" in the last 72 hours. Thyroid Function Tests: No results for input(s): "TSH", "T4TOTAL", "FREET4", "T3FREE", "THYROIDAB" in the last 72 hours. Anemia Panel: No results for input(s): "VITAMINB12", "FOLATE", "FERRITIN", "TIBC", "IRON", "RETICCTPCT" in the last 72 hours. Sepsis Labs:  No results for input(s): "PROCALCITON", "LATICACIDVEN" in the last 168 hours.  Recent Results (from the past 240 hour(s))  Aerobic/Anaerobic Culture w Gram Stain (surgical/deep wound)     Status: None (Preliminary result)   Collection Time: 07/24/22  2:51 PM   Specimen: PATH Cytology Misc. fluid; Body Fluid  Result Value Ref Range Status   Specimen Description FLUID RIGHT WRIST  Final   Special Requests NONE  Final   Gram Stain NO WBC SEEN NO ORGANISMS SEEN   Final   Culture   Final    NO GROWTH < 24 HOURS Performed at [facility], [address] [place], [place] [address]    Report Status PENDING  Incomplete         Radiology Studies: DG FLUORO GUIDED NEEDLE PLC ASPIRATION/INJECTION LOC  Result Date: 07/24/2022 CLINICAL DATA:  Right wrist pain and swelling. Evidence of an inflammatory or septic arthritis on MRI. EXAM: RIGHT WRIST ARTHROCENTESIS TECHNIQUE: Informed consent was obtained with the  assistance of an interpreter and a time out performed. An appropriate skin entrance site was determined. The site was marked, prepped with Betadine, draped in the usual sterile fashion, and infiltrated locally with 1% Lidocaine. Multiple attempts were made using a 21-gauge needle to gain intra-articular access to the radiocarpal joint under intermittent fluoroscopy, however no fluid could be aspirated. Injection of a small amount of Omnipaque 300 confirmed intra-articular placement, and an image was saved as documentation. As joint fluid could still not be readily aspirated, 1 mL of normal saline was then injected into the joint space and subsequent aspiration yielded 1 mL of pink-tinged fluid. The needle was removed, and the site was bandaged. There was no immediate complication. This exam was performed by Hayley Boisseau, PA-C, and was supervised and interpreted by Allen Grady, MD. FLUOROSCOPY: Radiation Exposure Index (as provided by the fluoroscopic device): 0.30 mGy Kerma FINDINGS: 1 mL of joint washings obtained for laboratory testing IMPRESSION: Technically successful but challenging right wrist arthrocentesis. Electronically Signed   By: Allen  Grady M.D.   On: 07/24/2022 15:23        Scheduled Meds:  atorvastatin  40 mg Per Tube Daily   Chlorhexidine Gluconate Cloth  6 each Topical Daily   colchicine  0.3 mg Per Tube BID   sennosides  5 mL Per Tube BID   And   docusate  100 mg Per Tube BID   donepezil  5 mg Per Tube QHS   famotidine  40 mg Per Tube Daily   feeding supplement  237 mL Per Tube BID BM   ferrous sulfate  300 mg Per Tube Q breakfast   finasteride  5 mg Oral Daily   gabapentin  200 mg Per Tube Q8H   insulin aspart  0-6 Units Subcutaneous TID WC   lidocaine  1 patch Transdermal Q24H   lidocaine  1 patch Transdermal Q24H   magnesium gluconate  500 mg Per Tube BID   methocarbamol  500 mg Per Tube TID   [START ON 07/26/2022] metoprolol succinate  12.5 mg Oral Daily    mirtazapine  15 mg Per Tube QHS   morphine  15 mg Oral Q12H   nystatin  5 mL Oral QID   pantoprazole  40 mg Oral BID   polyethylene glycol  17 g Per Tube BID   predniSONE  60 mg Oral Q breakfast   tamsulosin  0.4 mg Oral QPC supper   thiamine  100 mg Per Tube Daily     Continuous Infusions:  feeding supplement (OSMOLITE 1.5 CAL) 1,000 mL (07/23/22 1752)     LOS: 60 days    Time spent: 35 minutes.      A , MD [facility]   If 7PM-7AM, please contact night-coverage [email or web address]  07/25/2022, 1:26 PM   

## 2022-07-26 LAB — BASIC METABOLIC PANEL
Anion gap: 6 (ref 5–15)
BUN: 20 mg/dL (ref 6–20)
CO2: 26 mmol/L (ref 22–32)
Calcium: 9.6 mg/dL (ref 8.9–10.3)
Chloride: 99 mmol/L (ref 98–111)
Creatinine, Ser: 1.08 mg/dL (ref 0.61–1.24)
GFR, Estimated: >60 mL/min (ref >60)
Glucose, Bld: 270 mg/dL — ABNORMAL HIGH (ref 70–99)
Potassium: 5.3 mmol/L — ABNORMAL HIGH (ref 3.5–5.1)
Sodium: 131 mmol/L — ABNORMAL LOW (ref 135–145)

## 2022-07-26 LAB — GLUCOSE, CAPILLARY
Glucose-Capillary: 262 mg/dL — ABNORMAL HIGH (ref 70–99)
Glucose-Capillary: 235 mg/dL — ABNORMAL HIGH (ref 70–99)
Glucose-Capillary: 213 mg/dL — ABNORMAL HIGH (ref 70–99)
Glucose-Capillary: 271 mg/dL — ABNORMAL HIGH (ref 70–99)
Glucose-Capillary: 231 mg/dL — ABNORMAL HIGH (ref 70–99)

## 2022-07-26 LAB — MAGNESIUM: Magnesium: 1.8 mg/dL (ref 1.7–2.4)

## 2022-07-26 LAB — PHOSPHORUS: Phosphorus: 3.3 mg/dL (ref 2.5–4.6)

## 2022-07-26 MED ORDER — SODIUM ZIRCONIUM CYCLOSILICATE 5 G PO PACK
5.0000 g | PACK | Freq: Once | ORAL | Status: AC
  Administered 2022-07-26: 5 g via ORAL
  Filled 2022-07-26: qty 1

## 2022-07-26 MED ORDER — METHOCARBAMOL 500 MG PO TABS
500.0000 mg | ORAL_TABLET | Freq: Three times a day (TID) | ORAL | Status: DC | PRN
  Administered 2022-07-27 – 2022-07-28 (×2): 500 mg via ORAL
  Filled 2022-07-26 (×2): qty 1

## 2022-07-26 MED ORDER — SODIUM CHLORIDE 0.9 % IV SOLN
INTRAVENOUS | Status: DC
  Administered 2022-07-27: 75 mL/h via INTRAVENOUS

## 2022-07-26 MED ORDER — OXYCODONE-ACETAMINOPHEN 5-325 MG PO TABS
1.0000 | ORAL_TABLET | Freq: Four times a day (QID) | ORAL | Status: DC | PRN
  Administered 2022-07-26 – 2022-08-01 (×10): 1 via ORAL
  Filled 2022-07-26 (×11): qty 1

## 2022-07-26 NOTE — TOC Progression Note (Signed)
Transition of Care [facility]) - Progression Note    Patient Details  Name: Wayne Stokes MRN: [id number] Date of Birth: Apr 13, 1966  [facility] of [facility] CM/SW Contact  Delilah Shan, LCSWA Phone Number: 07/26/2022, 2:16 PM  Clinical Narrative:     Patient has SNF bed at [facility] when medically ready for dc. TOC will continue ot follow and assist with patients dc planning needs.  Expected Discharge Plan: Skilled Nursing Facility Barriers to Discharge: Continued Medical Work up  Expected Discharge Plan and Services Expected Discharge Plan: Skilled Nursing Facility In-house Referral: Artist Discharge Planning Services: CM Consult                                           Social Determinants of Health (SDOH) Interventions Food Insecurity Interventions: Inpatient TOC Transportation Interventions: Inpatient TOC  Readmission Risk Interventions     No data to display

## 2022-07-26 NOTE — Progress Notes (Signed)
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  Neurosurgery and orthopedics were consulted recommended TLSO brace and pain control initially.  IR consulted for kyphoplasty, completed on 06/18/2022. There was some concern for right shoulder septic arthritis.  ID consulted, and orthopedic. IR aspirated joint with no finding suggestive of infection. Antibiotics were discontinued. Due to weight loss and poor oral intake he was seen by dietitian on 11/10 and started on core track tube feeds.    Assessment & Plan:   Principal Problem:   Pyogenic arthritis of right shoulder region [facility]) Active Problems:   Spinal stenosis of lumbar region with neurogenic claudication   T12 compression fracture (HCC)   AKI (acute kidney injury) ([place])   Hyponatremia   Acute urinary retention   Type 2 diabetes mellitus without complication, without long-term current use of insulin (HCC)   Leukocytosis   Hypertension associated with diabetes ([place])   Iron deficiency anemia   Hyperlipidemia associated with type 2 diabetes mellitus (HCC)   Mild cognitive impairment   Thrush, oral   Right shoulder pain   Discitis of lumbar region   Chronic low back pain with bilateral sciatica   Protein-calorie malnutrition, severe   Oropharyngeal dysphagia   1-Dysphagia Esophageal Dysmotility.  Poor oral Intake:  Severe Protein caloric Malnutrition:  Evaluated by GI, does not recommend endoscopy. Is more oropharyngeal dysphagia  Continue with speech swallow.  Esophagogram; Esophageal  dysmotility disorder.  On core track until oral intake and nutrition improves.  Encourage oral intake.  Plan to advanced diet to mechanical soft, to see if patient would eat more.  Will ask Nutritionist to transition tube feeding to nocturnal schedule.    Oral thrush: Continue with nystatin.  Hypoalbuminemia: Continue with tube feed  Hypomagnesemia:  Replaced.   T12 compression fracture, back pain Lumbar spinal stenosis MRI negative for cord injury Neurosurgery recommended conservative measure IR was consulted and underwent kyphoplasty on 10/11  Chronic right Shoulder pain Right Wrist pain;  Right of the shoulder was concerning with septic arthritis.  ID was consulted and work-up was negative for any infection.  I believe the antibiotics were discontinued. He had aspiration of right shoulder by IR with negative synovial fluid. MRI wrist showed inflammatory arthropathy versus septic arthritis. ESR and CRP continue to be elevated uric acid normal.  Discussed with Ortho Pa. They recommend wrist aspiration by IR to rule out infection. IR consulted Received Toradol for 2 days.  Synovial fluid; No significant WBC, no growth in 24 hours.  Started on Prednisone 11/17 RF pending.  Will discontinue Morphine.  He seem to have less swelling on his hand and is moving hand better.   Acute urinary retention: He has failed voiding trial 3 times. Continue with Flomax and Proscar  Constipation: Continue with bowel regimen  Hyperkalemia; will give lokelma  Nutrition Problem: Severe Malnutrition Etiology: acute illness    Signs/Symptoms: energy intake < or equal to 50% for > or equal to 5 days, severe fat depletion, severe muscle depletion, percent weight loss (20% wt loss in 2 months) Percent weight loss: 20 %    Interventions: Tube feeding  Estimated  body mass index is 23.59 kg/m as calculated from the following:   Height as of this encounter: 5' (1.524 m).   Weight as of this  encounter: 54.8 kg.   DVT prophylaxis: SCD Code Status: Full code Family Communication: Care discussed with patient.  Disposition Plan:  Status is: Inpatient Remains inpatient appropriate because: dysphagia, joint pain     Consultants:  ID Ortho  Procedures:    Antimicrobials:    Subjective: He is sleepy. Appears in less pain. Right hand less swollen.   Objective: Vitals:   07/25/22 1600 07/25/22 1949 07/26/22 0426 07/26/22 0848  BP:  97/71 109/79 105/78  Pulse: 100 83 96 94  Resp:  _0 Temp:  98.1 F (36.7 C) 97.9 F (36.6 C) 98 F (36.7 C)  TempSrc:  Oral Axillary Oral  SpO2:  95% 96% 96%  Weight:      Height:        Intake/Output Summary (Last 24 hours) at 07/26/2022 1240 Last data filed at 07/26/2022 1201 Gross per 24 hour  Intake 560 ml  Output 950 ml  Net -390 ml    Filed Weights   07/19/22 0500 07/21/22 0500 07/22/22 0500  Weight: 51.2 kg 51.7 kg 54.8 kg    Examination:  General exam: NAD Respiratory system: CTA Cardiovascular system: S 1, S 2 RRR Gastrointestinal system: BS present, soft nt Central nervous system: Sleepy  Extremities:  Right hand with less swelling.   Data Reviewed: I have personally reviewed following labs and imaging studies  CBC: Recent Labs  Lab 07/24/22 0252  WBC 5.9  HGB 8.0*  HCT 25.0*  MCV 69.6*  PLT 841    Basic Metabolic Panel: Recent Labs  Lab 07/21/22 0309 07/22/22 0338 07/23/22 0519 07/24/22 0252 07/26/22 0230  NA 134* 136 133* 133* 131*  K 3.8 4.2 3.8 4.3 5.3*  CL 102 104 101 98 99  CO2 _1 GLUCOSE 138* 170* 140* 144* 270*  BUN _2 CREATININE 0.77 0.82 0.74 0.81 1.08  CALCIUM 9.8 9.2 9.1 9.4 9.6  MG 1.6* 1.5* 1.8 2.3 1.8  PHOS 2.9 3.5 4.1 4.9* 3.3    GFR: Estimated Creatinine Clearance: 53.4 mL/min (by C-G formula based on SCr of 1.08 mg/dL). Liver Function Tests: Recent Labs  Lab 07/20/22 0640 07/21/22 0309 07/22/22 0338 07/23/22 0519  07/24/22 0252  AST  --   --  _3 ALT  --   --  _4 ALKPHOS  --   --  76 79 76  BILITOT  --   --  0.3 0.3 0.3  PROT  --   --  5.8* 6.6 6.1*  ALBUMIN <1.5* <1.5* <1.5* <1.5* <1.5*    No results for input(s): "LIPASE", "AMYLASE" in the last 168 hours. No results for input(s): "AMMONIA" in the last 168 hours. Coagulation Profile: No results for input(s): "INR", "PROTIME" in the last 168 hours. Cardiac Enzymes: No results for input(s): "CKTOTAL", "CKMB", "CKMBINDEX", "TROPONINI" in the last 168 hours. BNP (last 3 results) No results for input(s): "PROBNP" in the last 8760 hours. HbA1C: No results for input(s): "HGBA1C" in the last 72 hours. CBG: Recent Labs  Lab 07/25/22 1949 07/25/22 2326 07/26/22 0423 07/26/22 0751 07/26/22 1102  GLUCAP 266* 279* 262* 235* 213*    Lipid Profile: No results for input(s): "CHOL", "HDL", "LDLCALC", "TRIG", "CHOLHDL", "LDLDIRECT" in the last 72 hours. Thyroid Function Tests: No results for input(s): "  TSH", "T4TOTAL", "FREET4", "T3FREE", "THYROIDAB" in the last 72 hours. Anemia Panel: No results for input(s): "VITAMINB12", "FOLATE", "FERRITIN", "TIBC", "IRON", "RETICCTPCT" in the last 72 hours. Sepsis Labs: No results for input(s): "PROCALCITON", "LATICACIDVEN" in the last 168 hours.  Recent Results (from the past 240 hour(s))  Aerobic/Anaerobic Culture w Gram Stain (surgical/deep wound)     Status: None (Preliminary result)   Collection Time: 07/24/22  2:51 PM   Specimen: PATH Cytology Misc. fluid; Body Fluid  Result Value Ref Range Status   Specimen Description FLUID RIGHT WRIST  Final   Special Requests NONE  Final   Gram Stain NO WBC SEEN NO ORGANISMS SEEN   Final   Culture   Final    NO GROWTH 2 DAYS NO ANAEROBES ISOLATED; CULTURE IN PROGRESS FOR 5 DAYS Performed at [facility], [place] [address] [place], [place] [address]    Report Status PENDING  Incomplete         Radiology Studies: DG FLUORO GUIDED NEEDLE  PLC ASPIRATION/INJECTION LOC  Result Date: 07/24/2022 CLINICAL DATA:  Right wrist pain and swelling. Evidence of an inflammatory or septic arthritis on MRI. EXAM: RIGHT WRIST ARTHROCENTESIS TECHNIQUE: Informed consent was obtained with the assistance of an interpreter and a time out performed. An appropriate skin entrance site was determined. The site was marked, prepped with Betadine, draped in the usual sterile fashion, and infiltrated locally with 1% Lidocaine. Multiple attempts were made using a 21-gauge needle to gain intra-articular access to the radiocarpal joint under intermittent fluoroscopy, however no fluid could be aspirated. Injection of a small amount of Omnipaque 300 confirmed intra-articular placement, and an image was saved as documentation. As joint fluid could still not be readily aspirated, 1 mL of normal saline was then injected into the joint space and subsequent aspiration yielded 1 mL of pink-tinged fluid. The needle was removed, and the site was bandaged. There was no immediate complication. This exam was performed by Pasty Spillers, PA-C, and was supervised and interpreted by Logan Bores, MD. FLUOROSCOPY: Radiation Exposure Index (as provided by the fluoroscopic device): 0.30 mGy Kerma FINDINGS: 1 mL of joint washings obtained for laboratory testing IMPRESSION: Technically successful but challenging right wrist arthrocentesis. Electronically Signed   By: Logan Bores M.D.   On: 07/24/2022 15:23        Scheduled Meds:  atorvastatin  40 mg Per Tube Daily   Chlorhexidine Gluconate Cloth  6 each Topical Daily   colchicine  0.3 mg Per Tube BID   sennosides  5 mL Per Tube BID   And   docusate  100 mg Per Tube BID   donepezil  5 mg Per Tube QHS   famotidine  40 mg Per Tube Daily   feeding supplement  237 mL Per Tube BID BM   ferrous sulfate  300 mg Per Tube Q breakfast   finasteride  5 mg Oral Daily   gabapentin  200 mg Per Tube Q8H   insulin aspart  0-6 Units Subcutaneous  TID WC   lidocaine  1 patch Transdermal Q24H   lidocaine  1 patch Transdermal Q24H   magnesium gluconate  500 mg Per Tube BID   metoprolol succinate  12.5 mg Oral Daily   mirtazapine  15 mg Per Tube QHS   nystatin  5 mL Oral QID   pantoprazole  40 mg Oral BID   polyethylene glycol  17 g Per Tube BID   predniSONE  60 mg Oral Q breakfast   tamsulosin  0.4 mg Oral QPC supper   thiamine  100 mg Per Tube Daily   Continuous Infusions:  sodium chloride 75 mL/hr at 07/26/22 1017   feeding supplement (OSMOLITE 1.5 CAL) 1,000 mL (07/23/22 1752)     LOS: 61 days    Time spent: 35 minutes.     Elmarie Shiley, MD [facility]   If 7PM-7AM, please contact night-coverage [email or web address]  07/26/2022, 12:40 PM

## 2022-07-27 LAB — CBC
HCT: 23.8 % — ABNORMAL LOW (ref 39.0–52.0)
Hemoglobin: 7.6 g/dL — ABNORMAL LOW (ref 13.0–17.0)
MCH: 22.8 pg — ABNORMAL LOW (ref 26.0–34.0)
MCHC: 31.9 g/dL (ref 30.0–36.0)
MCV: 71.5 fL — ABNORMAL LOW (ref 80.0–100.0)
Platelets: 437 10*3/uL — ABNORMAL HIGH (ref 150–400)
RBC: 3.33 MIL/uL — ABNORMAL LOW (ref 4.22–5.81)
RDW: 22.1 % — ABNORMAL HIGH (ref 11.5–15.5)
WBC: 12.1 10*3/uL — ABNORMAL HIGH (ref 4.0–10.5)
nRBC: 0 % (ref 0.0–0.2)

## 2022-07-27 LAB — GLUCOSE, CAPILLARY
Glucose-Capillary: 114 mg/dL — ABNORMAL HIGH (ref 70–99)
Glucose-Capillary: 195 mg/dL — ABNORMAL HIGH (ref 70–99)
Glucose-Capillary: 274 mg/dL — ABNORMAL HIGH (ref 70–99)
Glucose-Capillary: 231 mg/dL — ABNORMAL HIGH (ref 70–99)

## 2022-07-27 LAB — BASIC METABOLIC PANEL
Anion gap: 8 (ref 5–15)
BUN: 24 mg/dL — ABNORMAL HIGH (ref 6–20)
CO2: 24 mmol/L (ref 22–32)
Calcium: 8.8 mg/dL — ABNORMAL LOW (ref 8.9–10.3)
Chloride: 101 mmol/L (ref 98–111)
Creatinine, Ser: 0.92 mg/dL (ref 0.61–1.24)
GFR, Estimated: >60 mL/min (ref >60)
Glucose, Bld: 235 mg/dL — ABNORMAL HIGH (ref 70–99)
Potassium: 4.6 mmol/L (ref 3.5–5.1)
Sodium: 133 mmol/L — ABNORMAL LOW (ref 135–145)

## 2022-07-27 LAB — RHEUMATOID FACTOR: Rheumatoid fact SerPl-aCnc: >650 IU/mL — ABNORMAL HIGH (ref <14.0)

## 2022-07-27 MED ORDER — LOPERAMIDE HCL 2 MG PO CAPS
4.0000 mg | ORAL_CAPSULE | ORAL | Status: DC | PRN
  Administered 2022-07-27 – 2022-07-29 (×3): 4 mg via ORAL
  Filled 2022-07-27 (×3): qty 2

## 2022-07-27 MED ORDER — COLCHICINE 0.3 MG HALF TABLET
0.3000 mg | ORAL_TABLET | Freq: Every day | ORAL | Status: DC
  Administered 2022-07-28 – 2022-07-31 (×4): 0.3 mg
  Filled 2022-07-27 (×4): qty 1

## 2022-07-27 MED ORDER — PREDNISONE 20 MG PO TABS
40.0000 mg | ORAL_TABLET | Freq: Every day | ORAL | Status: DC
  Administered 2022-07-28: 40 mg via ORAL
  Filled 2022-07-27: qty 2

## 2022-07-27 NOTE — Progress Notes (Signed)
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  Neurosurgery and orthopedics were consulted recommended TLSO brace and pain control initially.  IR consulted for kyphoplasty, completed on 06/18/2022. There was some concern for right shoulder septic arthritis.  ID consulted, and orthopedic. IR aspirated joint with no finding suggestive of infection. Antibiotics were discontinued. Due to weight loss and poor oral intake he was seen by dietitian on 11/10 and started on core track tube feeds.    Assessment & Plan:   Principal Problem:   Pyogenic arthritis of right shoulder region [facility]) Active Problems:   Spinal stenosis of lumbar region with neurogenic claudication   T12 compression fracture (HCC)   AKI (acute kidney injury) ([place])   Hyponatremia   Acute urinary retention   Type 2 diabetes mellitus without complication, without long-term current use of insulin (HCC)   Leukocytosis   Hypertension associated with diabetes ([place])   Iron deficiency anemia   Hyperlipidemia associated with type 2 diabetes mellitus (HCC)   Mild cognitive impairment   Thrush, oral   Right shoulder pain   Discitis of lumbar region   Chronic low back pain with bilateral sciatica   Protein-calorie malnutrition, severe   Oropharyngeal dysphagia   1-Dysphagia Esophageal Dysmotility.  Poor oral Intake:  Severe Protein caloric Malnutrition:  Evaluated by GI, does not recommend endoscopy. Is more oropharyngeal dysphagia  Continue with speech swallow.  Esophagogram; Esophageal  dysmotility disorder.  On core track until oral intake and nutrition improves.  Encourage oral intake.  Plan to advanced diet to mechanical soft, to see if patient would eat more.   Oral thrush: Continue with nystatin.  Hypoalbuminemia: Continue with tube feed  Hypomagnesemia:  Replaced.   T12 compression fracture, back pain Lumbar spinal stenosis MRI negative for cord injury Neurosurgery recommended conservative measure IR was consulted and underwent kyphoplasty on 10/11  Chronic right Shoulder pain Right Wrist pain;  Right of the shoulder was concerning with septic arthritis.  ID was consulted and work-up was negative for any infection.  I believe the antibiotics were discontinued. He had aspiration of right shoulder by IR with negative synovial fluid. MRI wrist showed inflammatory arthropathy versus septic arthritis. ESR and CRP continue to be elevated uric acid normal.  Discussed with Ortho Pa. They recommend wrist aspiration by IR to rule out infection. IR consulted Received Toradol for 2 days.  Discontinue MS contin to avoid oversedation, Robaxin to PRN Synovial fluid; No significant WBC, no growth  Pain and swelling improved on prednisone. Patient has been using more right hand.  RF pending.   Acute urinary retention: He has failed voiding trial 3 times. Continue with Flomax and Proscar  Constipation: Continue with bowel regimen Now ith diarrhea. Hold BM regimen    Nutrition Problem: Severe Malnutrition Etiology: acute illness    Signs/Symptoms: energy intake < or equal to 50% for > or equal to 5 days, severe fat depletion, severe muscle depletion, percent weight loss (20% wt loss in 2 months) Percent weight loss: 20 %    Interventions: Tube feeding  Estimated body mass index is 23.59 kg/m as calculated from the following:  Height as of this encounter: 5' (1.524 m).   Weight as of this encounter: 54.8 kg.   DVT prophylaxis: SCD Code Status: Full  code Family Communication: Care discussed with patient.  Disposition Plan:  Status is: Inpatient Remains inpatient appropriate because: dysphagia, joint pain     Consultants:  ID Ortho  Procedures:    Antimicrobials:    Subjective: Calm moving right hand, less swelling  Objective: Vitals:   07/26/22 1412 07/26/22 1947 07/27/22 0440 07/27/22 0735  BP: 114/81 125/73 (!) 157/84 130/85  Pulse: 83 76 62 80  Resp:  _0 Temp: 97.8 F (36.6 C) 98.4 F (36.9 C)  98.4 F (36.9 C)  TempSrc: Oral Oral    SpO2: 95% 98% 95% 100%  Weight:      Height:        Intake/Output Summary (Last 24 hours) at 07/27/2022 1311 Last data filed at 07/27/2022 0310 Gross per 24 hour  Intake 3846.1 ml  Output 800 ml  Net 3046.1 ml    Filed Weights   07/19/22 0500 07/21/22 0500 07/22/22 0500  Weight: 51.2 kg 51.7 kg 54.8 kg    Examination:  General exam: NAD Respiratory system: CTA Cardiovascular system: S 1, S 2 RR Gastrointestinal system: BS present, soft, nt Central nervous system: Alert, follows command Extremities:  Right hand with less swelling   Data Reviewed: I have personally reviewed following labs and imaging studies  CBC: Recent Labs  Lab 07/24/22 0252 07/27/22 0235  WBC 5.9 12.1*  HGB 8.0* 7.6*  HCT 25.0* 23.8*  MCV 69.6* 71.5*  PLT 337 437*    Basic Metabolic Panel: Recent Labs  Lab 07/21/22 0309 07/22/22 0338 07/23/22 0519 07/24/22 0252 07/26/22 0230 07/27/22 0235  NA 134* 136 133* 133* 131* 133*  K 3.8 4.2 3.8 4.3 5.3* 4.6  CL 102 104 101 98 99 101  CO2 _1 GLUCOSE 138* 170* 140* 144* 270* 235*  BUN _2 24*  CREATININE 0.77 0.82 0.74 0.81 1.08 0.92  CALCIUM 9.8 9.2 9.1 9.4 9.6 8.8*  MG 1.6* 1.5* 1.8 2.3 1.8  --   PHOS 2.9 3.5 4.1 4.9* 3.3  --     GFR: Estimated Creatinine Clearance: 62.7 mL/min (by C-G formula based on SCr of 0.92 mg/dL). Liver Function Tests: Recent Labs  Lab 07/21/22 0309 07/22/22 0338  07/23/22 0519 07/24/22 0252  AST  --  _3 ALT  --  _4 ALKPHOS  --  76 79 76  BILITOT  --  0.3 0.3 0.3  PROT  --  5.8* 6.6 6.1*  ALBUMIN <1.5* <1.5* <1.5* <1.5*    No results for input(s): "LIPASE", "AMYLASE" in the last 168 hours. No results for input(s): "AMMONIA" in the last 168 hours. Coagulation Profile: No results for input(s): "INR", "PROTIME" in the last 168 hours. Cardiac Enzymes: No results for input(s): "CKTOTAL", "CKMB", "CKMBINDEX", "TROPONINI" in the last 168 hours. BNP (last 3 results) No results for input(s): "PROBNP" in the last 8760 hours. HbA1C: No results for input(s): "HGBA1C" in the last 72 hours. CBG: Recent Labs  Lab 07/26/22 1102 07/26/22 1528 07/26/22 1945 07/27/22 0733 07/27/22 1151  GLUCAP 213* 271* 231* 114* 195*    Lipid Profile: No results for input(s): "CHOL", "HDL", "LDLCALC", "TRIG", "CHOLHDL", "LDLDIRECT" in the last 72 hours. Thyroid Function Tests: No results for input(s): "TSH", "T4TOTAL", "FREET4", "T3FREE", "THYROIDAB" in the last 72 hours. Anemia Panel:  No results for input(s): "VITAMINB12", "FOLATE", "FERRITIN", "TIBC", "IRON", "RETICCTPCT" in the last 72 hours. Sepsis Labs: No results for input(s): "PROCALCITON", "LATICACIDVEN" in the last 168 hours.  Recent Results (from the past 240 hour(s))  Aerobic/Anaerobic Culture w Gram Stain (surgical/deep wound)     Status: None (Preliminary result)   Collection Time: 07/24/22  2:51 PM   Specimen: PATH Cytology Misc. fluid; Body Fluid  Result Value Ref Range Status   Specimen Description FLUID RIGHT WRIST  Final   Special Requests NONE  Final   Gram Stain NO WBC SEEN NO ORGANISMS SEEN   Final   Culture   Final    NO GROWTH 3 DAYS NO ANAEROBES ISOLATED; CULTURE IN PROGRESS FOR 5 DAYS Performed at [facility], [address] [place], [place] [address]    Report Status PENDING  Incomplete         Radiology Studies: No results found.      Scheduled  Meds:  atorvastatin  40 mg Per Tube Daily   Chlorhexidine Gluconate Cloth  6 each Topical Daily   [START ON 07/28/2022] colchicine  0.3 mg Per Tube Daily   donepezil  5 mg Per Tube QHS   famotidine  40 mg Per Tube Daily   feeding supplement  237 mL Per Tube BID BM   ferrous sulfate  300 mg Per Tube Q breakfast   finasteride  5 mg Oral Daily   gabapentin  200 mg Per Tube Q8H   insulin aspart  0-6 Units Subcutaneous TID WC   lidocaine  1 patch Transdermal Q24H   lidocaine  1 patch Transdermal Q24H   magnesium gluconate  500 mg Per Tube BID   metoprolol succinate  12.5 mg Oral Daily   mirtazapine  15 mg Per Tube QHS   nystatin  5 mL Oral QID   pantoprazole  40 mg Oral BID   predniSONE  60 mg Oral Q breakfast   tamsulosin  0.4 mg Oral QPC supper   thiamine  100 mg Per Tube Daily   Continuous Infusions:  sodium chloride 75 mL/hr (07/27/22 1302)   feeding supplement (OSMOLITE 1.5 CAL) 50 mL/hr at 07/26/22 1829     LOS: 62 days    Time spent: 35 minutes.     Elmarie Shiley, MD [facility]   If 7PM-7AM, please contact night-coverage [email or web address]  07/27/2022, 1:11 PM

## 2022-07-28 LAB — CBC
HCT: 21.8 % — ABNORMAL LOW (ref 39.0–52.0)
Hemoglobin: 7.1 g/dL — ABNORMAL LOW (ref 13.0–17.0)
MCH: 22.7 pg — ABNORMAL LOW (ref 26.0–34.0)
MCHC: 32.6 g/dL (ref 30.0–36.0)
MCV: 69.6 fL — ABNORMAL LOW (ref 80.0–100.0)
Platelets: 494 10*3/uL — ABNORMAL HIGH (ref 150–400)
RBC: 3.13 MIL/uL — ABNORMAL LOW (ref 4.22–5.81)
RDW: 22.1 % — ABNORMAL HIGH (ref 11.5–15.5)
WBC: 10 10*3/uL (ref 4.0–10.5)
nRBC: 0 % (ref 0.0–0.2)

## 2022-07-28 LAB — BASIC METABOLIC PANEL
Anion gap: 6 (ref 5–15)
BUN: 23 mg/dL — ABNORMAL HIGH (ref 6–20)
CO2: 26 mmol/L (ref 22–32)
Calcium: 8.7 mg/dL — ABNORMAL LOW (ref 8.9–10.3)
Chloride: 106 mmol/L (ref 98–111)
Creatinine, Ser: 0.78 mg/dL (ref 0.61–1.24)
GFR, Estimated: >60 mL/min (ref >60)
Glucose, Bld: 95 mg/dL (ref 70–99)
Potassium: 4 mmol/L (ref 3.5–5.1)
Sodium: 138 mmol/L (ref 135–145)

## 2022-07-28 LAB — GLUCOSE, CAPILLARY
Glucose-Capillary: 175 mg/dL — ABNORMAL HIGH (ref 70–99)
Glucose-Capillary: 148 mg/dL — ABNORMAL HIGH (ref 70–99)
Glucose-Capillary: 96 mg/dL (ref 70–99)
Glucose-Capillary: 99 mg/dL (ref 70–99)
Glucose-Capillary: 227 mg/dL — ABNORMAL HIGH (ref 70–99)
Glucose-Capillary: 194 mg/dL — ABNORMAL HIGH (ref 70–99)

## 2022-07-28 MED ORDER — PREDNISONE 20 MG PO TABS
30.0000 mg | ORAL_TABLET | Freq: Every day | ORAL | Status: DC
  Administered 2022-07-29: 30 mg via ORAL
  Filled 2022-07-28: qty 1

## 2022-07-28 NOTE — Progress Notes (Signed)
Mobility Specialist Progress Note   07/28/22 1200  Mobility  Activity Transferred to/from [facility] (Deferred sitting in recliner second to pain)  Level of Assistance Moderate assist, patient does 50-74%  Assistive Device Front wheel walker  Range of Motion/Exercises Active;All extremities  Activity Response Tolerated well   Patient received in supine, declined OOB to recliner but requested assistance to [facility] for BM. While resting patient had no complaints of pain, but upon dangling EOB pt c/o severe pain. Transferred to and from [facility] with modA. Was unable to have BM so returned to room without incident. Pain still very limiting as he was unable to stand with upright position without grimacing in pain. Was assisted in supine with mod-max A and was left with all needs met, call bell in reach.   [place] Brieanne Mignone, BS EXP Mobility Specialist Please contact via SecureChat or Rehab office at [phone]

## 2022-07-28 NOTE — Progress Notes (Signed)
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  Neurosurgery and orthopedics were consulted recommended TLSO brace and pain control initially.  IR consulted for kyphoplasty, completed on 06/18/2022. There was some concern for right shoulder septic arthritis.  ID consulted, and orthopedic. IR aspirated joint with no finding suggestive of infection. Antibiotics were discontinued. Due to weight loss and poor oral intake he was seen by dietitian on 11/10 and started on core track tube feeds.    Assessment & Plan:   Principal Problem:   Pyogenic arthritis of right shoulder region [facility]) Active Problems:   Spinal stenosis of lumbar region with neurogenic claudication   T12 compression fracture (HCC)   AKI (acute kidney injury) ([place])   Hyponatremia   Acute urinary retention   Type 2 diabetes mellitus without complication, without long-term current use of insulin (HCC)   Leukocytosis   Hypertension associated with diabetes ([place])   Iron deficiency anemia   Hyperlipidemia associated with type 2 diabetes mellitus (HCC)   Mild cognitive impairment   Thrush, oral   Right shoulder pain   Discitis of lumbar region   Chronic low back pain with bilateral sciatica   Protein-calorie malnutrition, severe   Oropharyngeal dysphagia   1-Dysphagia Esophageal Dysmotility.  Poor oral Intake:  Severe Protein caloric Malnutrition:  Evaluated by GI, does not recommend endoscopy. Is more oropharyngeal dysphagia  Continue with speech swallow.  Esophagogram; Esophageal  dysmotility disorder.  On core track until oral intake and nutrition improves.  Encourage oral intake.  Patient report only been able to swallow liquids, after few more spoon he has to spit, vomit food, it gets stuck.  I will contact GI for further evaluation.   Oral thrush: Continue with nystatin.  Hypoalbuminemia: Continue with tube feed  Hypomagnesemia:  Replaced.   T12 compression fracture, back pain Lumbar spinal stenosis MRI negative for cord injury Neurosurgery recommended conservative measure IR was consulted and underwent kyphoplasty on 10/11  RA exacerbation:  Chronic right Shoulder pain: Right Wrist pain;  Right of the shoulder was concerning with septic arthritis.  ID was consulted and work-up was negative for any infection.  I believe the antibiotics were discontinued. He had aspiration of right shoulder by IR with negative synovial fluid. MRI wrist showed inflammatory arthropathy versus septic arthritis. ESR and CRP continue to be elevated uric acid normal.  Discussed with Ortho Pa. They recommend wrist aspiration by IR to rule out infection. IR consulted Received Toradol for 2 days.  Discontinue MS contin to avoid oversedation, Robaxin to PRN Synovial fluid; No significant WBC, no growth  Pain and swelling improved on prednisone. Patient has been using more right hand.  RF elevated more than 785  Will check Cyclic Citrul peptide antibody.   Acute urinary retention: He has failed voiding trial 3 times. Continue with Flomax and Proscar.  Constipation: Continue with bowel regimen Now with  diarrhea. Hold BM regimen   Anemia; likely of chronic disease, and acute illness:  Ferritin 885, iron 49, folic acid 10, B 12 0277 Repeat hb, Blood transfusion for hb less than 7  Nutrition Problem: Severe Malnutrition Etiology: acute illness  Signs/Symptoms: energy intake < or equal to 50% for > or equal to 5 days, severe fat depletion, severe muscle depletion,  percent weight loss (20% wt loss in 2 months) Percent weight loss: 20 %    Interventions: Tube feeding  Estimated body mass index is 23.81 kg/m as calculated from the following:   Height as of this encounter: 5' (1.524 m).   Weight as of this encounter: 55.3 kg.   DVT prophylaxis: SCD Code Status: Full code Family Communication: Care discussed with patient.  Disposition Plan:  Status is: Inpatient Remains inpatient appropriate because: dysphagia, joint pain     Consultants:  ID Ortho  Procedures:    Antimicrobials:    Subjective: He continue to complaint of pain with movement, but he is now able to moves more his hand.    Objective: Vitals:   07/28/22 0232 07/28/22 0447 07/28/22 0748 07/28/22 1010  BP: (!) 158/88  (!) 152/97 (!) 149/88  Pulse: 74  70 83  Resp: 16   16  Temp: 97.8 F (36.6 C)  98.3 F (36.8 C) 97.6 F (36.4 C)  TempSrc: Oral  Oral Oral  SpO2: 96%  99% 98%  Weight:  55.3 kg    Height:        Intake/Output Summary (Last 24 hours) at 07/28/2022 1413 Last data filed at 07/28/2022 0653 Gross per 24 hour  Intake --  Output 1825 ml  Net -1825 ml    Filed Weights   07/21/22 0500 07/22/22 0500 07/28/22 0447  Weight: 51.7 kg 54.8 kg 55.3 kg    Examination:  General exam: NAD Respiratory system: CTA Cardiovascular system: S 1, S 2 RRR Gastrointestinal system: BS present, soft nt Central nervous system: Alert, follows command Extremities:  Right hand with less swelling   Data Reviewed: I have personally reviewed following labs and imaging studies  CBC: Recent Labs  Lab 07/24/22 0252 07/27/22 0235 07/28/22 0721  WBC 5.9 12.1* 10.0  HGB 8.0* 7.6* 7.1*  HCT 25.0* 23.8* 21.8*  MCV 69.6* 71.5* 69.6*  PLT 337 437* 494*    Basic Metabolic Panel: Recent Labs  Lab 07/22/22 0338 07/23/22 0519 07/24/22 0252 07/26/22 0230 07/27/22 0235 07/28/22 0721  NA 136 133* 133* 131* 133* 138  K 4.2 3.8 4.3 5.3* 4.6 4.0  CL 104 101 98 99  101 106  CO2 _0 GLUCOSE 170* 140* 144* 270* 235* 95  BUN _1 24* 23*  CREATININE 0.82 0.74 0.81 1.08 0.92 0.78  CALCIUM 9.2 9.1 9.4 9.6 8.8* 8.7*  MG 1.5* 1.8 2.3 1.8  --   --   PHOS 3.5 4.1 4.9* 3.3  --   --     GFR: Estimated Creatinine Clearance: 72 mL/min (by C-G formula based on SCr of 0.78 mg/dL). Liver Function Tests: Recent Labs  Lab 07/22/22 0338 07/23/22 0519 07/24/22 0252  AST _2 ALT _3 ALKPHOS 76 79 76  BILITOT 0.3 0.3 0.3  PROT 5.8* 6.6 6.1*  ALBUMIN <1.5* <1.5* <1.5*    No results for input(s): "LIPASE", "AMYLASE" in the last 168 hours. No results for input(s): "AMMONIA" in the last 168 hours. Coagulation Profile: No results for input(s): "INR", "PROTIME" in the last 168 hours. Cardiac Enzymes: No results for input(s): "CKTOTAL", "CKMB", "CKMBINDEX", "TROPONINI" in the last 168 hours. BNP (last 3 results) No results for input(s): "PROBNP" in the last 8760 hours. HbA1C: No results for input(s): "HGBA1C" in  the last 72 hours. CBG: Recent Labs  Lab 07/27/22 1931 07/28/22 0226 07/28/22 0448 07/28/22 0749 07/28/22 1136  GLUCAP 231* 175* 148* 96 99    Lipid Profile: No results for input(s): "CHOL", "HDL", "LDLCALC", "TRIG", "CHOLHDL", "LDLDIRECT" in the last 72 hours. Thyroid Function Tests: No results for input(s): "TSH", "T4TOTAL", "FREET4", "T3FREE", "THYROIDAB" in the last 72 hours. Anemia Panel: No results for input(s): "VITAMINB12", "FOLATE", "FERRITIN", "TIBC", "IRON", "RETICCTPCT" in the last 72 hours. Sepsis Labs: No results for input(s): "PROCALCITON", "LATICACIDVEN" in the last 168 hours.  Recent Results (from the past 240 hour(s))  Aerobic/Anaerobic Culture w Gram Stain (surgical/deep wound)     Status: None (Preliminary result)   Collection Time: 07/24/22  2:51 PM   Specimen: PATH Cytology Misc. fluid; Body Fluid  Result Value Ref Range Status   Specimen Description FLUID RIGHT WRIST  Final   Special  Requests NONE  Final   Gram Stain NO WBC SEEN NO ORGANISMS SEEN   Final   Culture   Final    NO GROWTH 4 DAYS NO ANAEROBES ISOLATED; CULTURE IN PROGRESS FOR 5 DAYS Performed at [facility], [address], [place], [place] [address]    Report Status PENDING  Incomplete         Radiology Studies: No results found.      Scheduled Meds:  atorvastatin  40 mg Per Tube Daily   Chlorhexidine Gluconate Cloth  6 each Topical Daily   colchicine  0.3 mg Per Tube Daily   donepezil  5 mg Per Tube QHS   feeding supplement  237 mL Per Tube BID BM   ferrous sulfate  300 mg Per Tube Q breakfast   finasteride  5 mg Oral Daily   gabapentin  200 mg Per Tube Q8H   insulin aspart  0-6 Units Subcutaneous TID WC   lidocaine  1 patch Transdermal Q24H   lidocaine  1 patch Transdermal Q24H   magnesium gluconate  500 mg Per Tube BID   metoprolol succinate  12.5 mg Oral Daily   mirtazapine  15 mg Per Tube QHS   nystatin  5 mL Oral QID   pantoprazole  40 mg Oral BID   predniSONE  40 mg Oral Q breakfast   tamsulosin  0.4 mg Oral QPC supper   thiamine  100 mg Per Tube Daily   Continuous Infusions:  sodium chloride 75 mL/hr (07/27/22 1302)   feeding supplement (OSMOLITE 1.5 CAL) 1,000 mL (07/27/22 1457)     LOS: 63 days    Time spent: 35 minutes.     Elmarie Shiley, MD [facility]   If 7PM-7AM, please contact night-coverage [email or web address]  07/28/2022, 2:13 PM

## 2022-07-28 NOTE — Plan of Care (Signed)

## 2022-07-28 NOTE — Progress Notes (Signed)
Physical Therapy Treatment Patient Details Name: Wayne Stokes MRN: [id number] DOB: [birth year] Today's Date: 07/28/2022   History of Present Illness 56 y.o. male presented 05/26/22 with c/o severe and progressively worsening low back pain that radiates into his left buttock and down his LLE. Imaging +acute T12 compression fracture and  L4/5 there is multifactorial severe spinal stenosis and severe bilateral foraminal stenosis. Neurosurgery consult with no acute surgical intervention needed. IR consulted for possible kyphoplasty bu twas delayed due to fever; S/p T12 KP with biopsy on 10/11. Pt coughing with water consumption 11/9 DO notified he may need SLP consult. 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    PT Comments    Pt received in supine, agreeable to therapy session, translator [name] present during session to interpret. Pt making good progress toward PT goals, able to progress to short household distance gait trials x3 with chair follow for safety and up to minA for gait stability with RW and lift assist with transfers. He reports pain has improved to moderate, which is better than the severe pain he reported in his last session. Pt with tachycardia with exertion, MD notified, of note pt Hgb low. Pt with loose stools/bowel incontinence and reports he has been unable to tolerate solid foods that arrived for lunch because it is causing vomiting, RN/MD notified. Pt continues to benefit from PT services to progress toward functional mobility goals.   Recommendations for follow up therapy are one component of a multi-disciplinary discharge planning process, led by the attending physician.  Recommendations may be updated based on patient status, additional functional criteria and insurance authorization.  Follow Up Recommendations  Skilled nursing-short term rehab (<3 hours/day) Can patient  physically be transported by private vehicle: No   Assistance Recommended at Discharge Frequent or constant Supervision/Assistance  Patient can return home with the following A little help with bathing/dressing/bathroom;Assistance with cooking/housework;Direct supervision/assist for medications management;Direct supervision/assist for financial management;Assist for transportation;Help with stairs or ramp for entrance;A lot of help with walking and/or transfers   Equipment Recommendations  BSC/3in1;Rollator (4 wheels)    Recommendations for Other Services       Precautions / Restrictions Precautions Precautions: Fall;Back Precaution Booklet Issued: No Precaution Comments: Back precautions for comfort Required Braces or Orthoses: Spinal Brace Spinal Brace: Lumbar corset;Applied in sitting position Restrictions Weight Bearing Restrictions: No     Mobility  Bed Mobility Overal bed mobility: Needs Assistance Bed Mobility: Rolling, Sidelying to Sit Rolling: Min guard Sidelying to sit: Min assist       General bed mobility comments: increased time and multimodal cues to initiate and sequence for log rolling; minA via HHA to raise trunk, pt also using rail    Transfers Overall transfer level: Needs assistance Equipment used: Rolling walker (2 wheels) Transfers: Sit to/from Stand Sit to Stand: Min assist   Step pivot transfers: Min assist, +2 safety/equipment       General transfer comment: pt holding RW too far advanced while pivoting, improves with cues; interpreter present and assisting to manage IV pole for pt safety.    Ambulation/Gait Ambulation/Gait assistance: Min assist, +2 safety/equipment Gait Distance (Feet): 50 Feet (50ft x3 with seated break ~2-5 mins between trials) Assistive device: Rolling walker (2 wheels) Gait Pattern/deviations: Step-through pattern, Decreased stride length, Trunk flexed, Drifts right/left       General Gait Details: Pt needs mod  cues for proximity to RW and safety/activity pacing; chair  follow for safety, pt taking x2 seated breaks (3 total trials). Pt reports improved knee pain and hip pain today. Pt HR to 140 bpm with exertion and decreased to 110 bpm with seated break, MD notified pt tachy today.     Balance Overall balance assessment: Needs assistance Sitting-balance support: Feet supported, No upper extremity supported Sitting balance-Leahy Scale: Fair Sitting balance - Comments: able to maintain sitting balance while donning back brace   Standing balance support: During functional activity, Bilateral upper extremity supported, Reliant on assistive device for balance Standing balance-Leahy Scale: Poor Standing balance comment: Reliant on RW and external assist           Cognition Arousal/Alertness: Awake/alert Behavior During Therapy: Flat affect Overall Cognitive Status: Impaired/Different from baseline Area of Impairment: Safety/judgement, Following commands, Problem solving, Memory   Memory: Decreased recall of precautions, Decreased short-term memory Following Commands: Follows multi-step commands inconsistently, Follows one step commands with increased time Safety/Judgement: Decreased awareness of safety, Decreased awareness of deficits Awareness: Intellectual Problem Solving: Requires verbal cues, Difficulty sequencing, Requires tactile cues General Comments: Pt following simple instructions well today with translator to interpret, pt instructed prior to mobility not to bend >90* or twist at back but often forgetting during mobility needs reinforcement. With MD in room briefly, pt reports pain is the same but with discussion, he agrees it is a bit better.        Exercises General Exercises - Lower Extremity Long Arc Quad: AROM, Both, AAROM, Seated, 20 reps Hip Flexion/Marching: AROM, Both, Seated, 20 reps (tactile cues)    General Comments General comments (skin integrity, edema, etc.): HR 110  bpm seated and up to 140 bpm with exertion, MD notified; SpO2 WFL on RA      Pertinent Vitals/Pain Pain Assessment Pain Assessment: 0-10 Pain Score: 5  Breathing: normal Negative Vocalization: none Facial Expression: smiling or inexpressive Body Language: tense, distressed pacing, fidgeting Consolability: no need to console PAINAD Score: 1 Pain Location: B shoulders and a little at his hands, pt admits during gait trial that his knees feel better today. Pain Descriptors / Indicators: Grimacing, Sore, Discomfort Pain Intervention(s): Monitored during session, Repositioned, Premedicated before session           PT Goals (current goals can now be found in the care plan section) Acute Rehab PT Goals Patient Stated Goal: decreased pain PT Goal Formulation: With patient Time For Goal Achievement: 08/06/22 (updated per PT did progress note on 11/15.) Progress towards PT goals: Progressing toward goals    Frequency    Min 3X/week      PT Plan Current plan remains appropriate       AM-PAC PT "6 Clicks" Mobility   Outcome Measure  Help needed turning from your back to your side while in a flat bed without using bedrails?: A Little Help needed moving from lying on your back to sitting on the side of a flat bed without using bedrails?: A Lot Help needed moving to and from a bed to a chair (including a wheelchair)?: A Lot Help needed standing up from a chair using your arms (e.g., wheelchair or bedside chair)?: A Lot Help needed to walk in hospital room?: A Lot Help needed climbing 3-5 steps with a railing? : Total 6 Click Score: 12    End of Session Equipment Utilized During Treatment: Gait belt;Back brace Activity Tolerance: Patient tolerated treatment well Patient left: in chair;with call bell/phone within reach;with chair alarm set;Other (comment) (NT entering the room, translator present)  Nurse Communication: Mobility status;Precautions;Other (comment) (needs new foam  dressing, the one on his bottom was soiled) PT Visit Diagnosis: Other abnormalities of gait and mobility (R26.89);Muscle weakness (generalized) (M62.81);Pain Pain - part of body:  (B knees, back, shoulders)     Time: 7017-7939 PT Time Calculation (min) (ACUTE ONLY): 47 min  Charges:  $Gait Training: 23-37 mins $Therapeutic Exercise: 8-22 mins                     Niklas Chretien P., PTA Acute Rehabilitation Services Secure Chat Preferred 9a-5:30pm Office: [phone]    Dorathy Kinsman [facility] 07/28/2022, 3:07 PM

## 2022-07-29 LAB — COMPREHENSIVE METABOLIC PANEL
ALT: 21 U/L (ref 0–44)
AST: 42 U/L — ABNORMAL HIGH (ref 15–41)
Albumin: <1.5 g/dL — ABNORMAL LOW (ref 3.5–5.0)
Alkaline Phosphatase: 68 U/L (ref 38–126)
Anion gap: 6 (ref 5–15)
BUN: 18 mg/dL (ref 6–20)
CO2: 28 mmol/L (ref 22–32)
Calcium: 8.3 mg/dL — ABNORMAL LOW (ref 8.9–10.3)
Chloride: 103 mmol/L (ref 98–111)
Creatinine, Ser: 0.73 mg/dL (ref 0.61–1.24)
GFR, Estimated: >60 mL/min (ref >60)
Glucose, Bld: 113 mg/dL — ABNORMAL HIGH (ref 70–99)
Potassium: 3.6 mmol/L (ref 3.5–5.1)
Sodium: 137 mmol/L (ref 135–145)
Total Bilirubin: 0.5 mg/dL (ref 0.3–1.2)
Total Protein: 5.5 g/dL — ABNORMAL LOW (ref 6.5–8.1)

## 2022-07-29 LAB — CBC
HCT: 21.9 % — ABNORMAL LOW (ref 39.0–52.0)
Hemoglobin: 7.2 g/dL — ABNORMAL LOW (ref 13.0–17.0)
MCH: 23.1 pg — ABNORMAL LOW (ref 26.0–34.0)
MCHC: 32.9 g/dL (ref 30.0–36.0)
MCV: 70.2 fL — ABNORMAL LOW (ref 80.0–100.0)
Platelets: 487 10*3/uL — ABNORMAL HIGH (ref 150–400)
RBC: 3.12 MIL/uL — ABNORMAL LOW (ref 4.22–5.81)
RDW: 22.1 % — ABNORMAL HIGH (ref 11.5–15.5)
WBC: 7.8 10*3/uL (ref 4.0–10.5)
nRBC: 0.5 % — ABNORMAL HIGH (ref 0.0–0.2)

## 2022-07-29 LAB — AEROBIC/ANAEROBIC CULTURE W GRAM STAIN (SURGICAL/DEEP WOUND)
Culture: NO GROWTH
Gram Stain: NONE SEEN

## 2022-07-29 LAB — GLUCOSE, CAPILLARY
Glucose-Capillary: 116 mg/dL — ABNORMAL HIGH (ref 70–99)
Glucose-Capillary: 149 mg/dL — ABNORMAL HIGH (ref 70–99)
Glucose-Capillary: 157 mg/dL — ABNORMAL HIGH (ref 70–99)
Glucose-Capillary: 163 mg/dL — ABNORMAL HIGH (ref 70–99)
Glucose-Capillary: 204 mg/dL — ABNORMAL HIGH (ref 70–99)

## 2022-07-29 LAB — HEPATITIS PANEL, ACUTE
HCV Ab: NONREACTIVE
Hep A IgM: NONREACTIVE
Hep B C IgM: NONREACTIVE
Hepatitis B Surface Ag: NONREACTIVE

## 2022-07-29 LAB — CYCLIC CITRUL PEPTIDE ANTIBODY, IGG/IGA: CCP Antibodies IgG/IgA: >250 units — ABNORMAL HIGH (ref 0–19)

## 2022-07-29 MED ORDER — PREDNISONE 5 MG PO TABS: 5.0000 mg | ORAL_TABLET | Freq: Every day | ORAL | Status: DC

## 2022-07-29 MED ORDER — FREE WATER
100.0000 mL | Status: DC
  Administered 2022-07-29 – 2022-07-30 (×9): 100 mL

## 2022-07-29 MED ORDER — PREDNISONE 10 MG PO TABS: 10.0000 mg | ORAL_TABLET | Freq: Every day | ORAL | Status: DC

## 2022-07-29 MED ORDER — PREDNISONE 5 MG PO TABS: 15.0000 mg | ORAL_TABLET | Freq: Every day | ORAL | Status: DC

## 2022-07-29 MED ORDER — OSMOLITE 1.5 CAL PO LIQD
1000.0000 mL | ORAL | Status: DC
  Administered 2022-07-29: 1000 mL

## 2022-07-29 MED ORDER — PREDNISONE 20 MG PO TABS
20.0000 mg | ORAL_TABLET | Freq: Every day | ORAL | Status: DC
  Administered 2022-07-30 – 2022-08-01 (×3): 20 mg via ORAL
  Filled 2022-07-29 (×3): qty 1

## 2022-07-29 NOTE — Patient Instructions (Addendum)
[facility]

## 2022-07-29 NOTE — Plan of Care (Signed)
Problem: Education: Goal: Knowledge of General Education information will improve Description: Including pain rating scale, medication(s)/side effects and non-pharmacologic comfort measures 07/29/2022 0455 by Dennard Schaumann, RN Outcome: Progressing 07/29/2022 0455 by Dennard Schaumann, RN Outcome: Progressing   Problem: Health Behavior/Discharge Planning: Goal: Ability to manage health-related needs will improve 07/29/2022 0455 by Dennard Schaumann, RN Outcome: Progressing 07/29/2022 0455 by Dennard Schaumann, RN Outcome: Progressing   Problem: Clinical Measurements: Goal: Ability to maintain clinical measurements within normal limits will improve 07/29/2022 0455 by Dennard Schaumann, RN Outcome: Progressing 07/29/2022 0455 by Dennard Schaumann, RN Outcome: Progressing Goal: Diagnostic test results will improve 07/29/2022 0455 by Dennard Schaumann, RN Outcome: Progressing 07/29/2022 0455 by Dennard Schaumann, RN Outcome: Progressing   Problem: Activity: Goal: Risk for activity intolerance will decrease 07/29/2022 0455 by Dennard Schaumann, RN Outcome: Progressing 07/29/2022 0455 by Dennard Schaumann, RN Outcome: Progressing   Problem: Coping: Goal: Level of anxiety will decrease 07/29/2022 0455 by Dennard Schaumann, RN Outcome: Progressing 07/29/2022 0455 by Dennard Schaumann, RN Outcome: Progressing   Problem: Elimination: Goal: Will not experience complications related to bowel motility 07/29/2022 0455 by Dennard Schaumann, RN Outcome: Progressing 07/29/2022 0455 by Dennard Schaumann, RN Outcome: Progressing Goal: Will not experience complications related to urinary retention 07/29/2022 0455 by Dennard Schaumann, RN Outcome: Progressing 07/29/2022 0455 by Dennard Schaumann, RN Outcome: Progressing   Problem: Pain Managment: Goal: General experience of comfort will improve 07/29/2022 0455 by Dennard Schaumann, RN Outcome: Progressing 07/29/2022 0455 by  Dennard Schaumann, RN Outcome: Progressing   Problem: Safety: Goal: Ability to remain free from injury will improve 07/29/2022 0455 by Dennard Schaumann, RN Outcome: Progressing 07/29/2022 0455 by Dennard Schaumann, RN Outcome: Progressing   Problem: Skin Integrity: Goal: Risk for impaired skin integrity will decrease 07/29/2022 0455 by Dennard Schaumann, RN Outcome: Progressing 07/29/2022 0455 by Dennard Schaumann, RN Outcome: Progressing   Problem: Education: Goal: Ability to describe self-care measures that may prevent or decrease complications (Diabetes Survival Skills Education) will improve 07/29/2022 0455 by Dennard Schaumann, RN Outcome: Progressing 07/29/2022 0455 by Dennard Schaumann, RN Outcome: Progressing Goal: Individualized Educational Video(s) 07/29/2022 0455 by Dennard Schaumann, RN Outcome: Progressing 07/29/2022 0455 by Dennard Schaumann, RN Outcome: Progressing   Problem: Coping: Goal: Ability to adjust to condition or change in health will improve 07/29/2022 0455 by Dennard Schaumann, RN Outcome: Progressing 07/29/2022 0455 by Dennard Schaumann, RN Outcome: Progressing   Problem: Fluid Volume: Goal: Ability to maintain a balanced intake and output will improve 07/29/2022 0455 by Dennard Schaumann, RN Outcome: Progressing 07/29/2022 0455 by Dennard Schaumann, RN Outcome: Progressing   Problem: Health Behavior/Discharge Planning: Goal: Ability to identify and utilize available resources and services will improve 07/29/2022 0455 by Dennard Schaumann, RN Outcome: Progressing 07/29/2022 0455 by Dennard Schaumann, RN Outcome: Progressing Goal: Ability to manage health-related needs will improve 07/29/2022 0455 by Dennard Schaumann, RN Outcome: Progressing 07/29/2022 0455 by Dennard Schaumann, RN Outcome: Progressing   Problem: Metabolic: Goal: Ability to maintain appropriate glucose levels will improve 07/29/2022 0455 by Dennard Schaumann,  RN Outcome: Progressing 07/29/2022 0455 by Dennard Schaumann, RN Outcome: Progressing   Problem: Nutritional: Goal: Maintenance of adequate nutrition will improve 07/29/2022 0455 by Dennard Schaumann, RN Outcome: Progressing 07/29/2022 0455 by Dennard Schaumann, RN Outcome: Progressing Goal: Progress toward achieving an optimal weight will improve 07/29/2022  4628 by John Giovanni, RN Outcome: Progressing 07/29/2022 0455 by John Giovanni, RN Outcome: Progressing   Problem: Skin Integrity: Goal: Risk for impaired skin integrity will decrease 07/29/2022 0455 by John Giovanni, RN Outcome: Progressing 07/29/2022 0455 by John Giovanni, RN Outcome: Progressing   Problem: Tissue Perfusion: Goal: Adequacy of tissue perfusion will improve 07/29/2022 0455 by John Giovanni, RN Outcome: Progressing 07/29/2022 0455 by John Giovanni, RN Outcome: Progressing

## 2022-07-29 NOTE — Progress Notes (Signed)
Nutrition Follow-up  DOCUMENTATION CODES:   Severe malnutrition in context of acute illness/injury  INTERVENTION:  - Modify TF to nocturnal feeds.  - Osmolite 1.5 @ 75 mL/hr over 16 hrs (6pm-10 am)  - This will provide the pt with 1800 kcals and 75 gm protein 915 mL free water.  - Add FWF of 100 mL Q2H (only run while TF is running). This will provide 800 mL free water total.   NUTRITION DIAGNOSIS:   Severe Malnutrition related to acute illness as evidenced by energy intake < or equal to 50% for > or equal to 5 days, severe fat depletion, severe muscle depletion, percent weight loss (20% wt loss in 2 months).  GOAL:   Patient will meet greater than or equal to 90% of their needs  MONITOR:   PO intake, Supplement acceptance, TF tolerance  REASON FOR ASSESSMENT:   Consult Assessment of nutrition requirement/status  ASSESSMENT:   56 y.o. male admits related to evaluation of worsening lower back pain. PMH includes: T2DM, HTN, anemia, AKI. 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  Meds include: ferrous sulfate, sliding scale insulin, remeron, thiamine. Labs reviewed:   Pt was present with interpreter, MD and SLP in the room at time of assessment. MD was discussing with the pt of possibility of PEG considering his still poor PO intakes. Pt's diet was changed to Dys 2 with thin liquids since last assessment. Pt remains with Cortrak in place and receiving TF at goal rate. RN reports that the pt has been complaining of abdominal pain. RD will modify tube feed to nocturnal to provide pt with some time off of the pump to see if he will eat more of his food. Pt also still receiving NS @ 75 mL/hr. Discussed with MD that RD will add FWF. Will continue to monitor TF tolerance and PO intakes.   Diet Order:   Diet Order             DIET DYS 2 Room service appropriate? Yes; Fluid  consistency: Thin  Diet effective now                   EDUCATION NEEDS:   Education needs have been addressed  Skin:  Skin Assessment: Reviewed RN Assessment  Last BM:  11/21  Height:   Ht Readings from Last 1 Encounters:  06/01/22 5' (1.524 m)    Weight:   Wt Readings from Last 1 Encounters:  07/29/22 56.4 kg    Ideal Body Weight:  48.2 kg  BMI:  Body mass index is 24.28 kg/m.  Estimated Nutritional Needs:   Kcal:  1600-1800  Protein:  80-95g  Fluid:  >/=1.6L  Harpreet Pompey [place], RD, LDN, CNSC.

## 2022-07-29 NOTE — Progress Notes (Signed)
Occupational Therapy Treatment Patient Details Name: Wayne Stokes MRN: [id number] DOB: 1966/07/01 Today's Date: 07/29/2022   History of present illness 56 y.o. male presented 05/26/22 with c/o severe and progressively worsening low back pain that radiates into his left buttock and down his LLE. Imaging +acute T12 compression fracture and  L4/5 there is multifactorial severe spinal stenosis and severe bilateral foraminal stenosis. Neurosurgery consult with no acute surgical intervention needed. IR consulted for possible kyphoplasty bu twas delayed due to fever; S/p T12 KP with biopsy on 10/11. Pt coughing with water consumption 11/9 DO notified he may need SLP consult. 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   OT comments  Pt in bed upon therapy arrival brushing his teeth with nursing providing set-up of supplies. Pt agreeable to participate in OT treatment session. Interpreter present to assist with session. Pt continues to demonstrate decreased BUE strength, activity tolerance and endurance requiring increased physical assist and increased time to complete BADL tasks and functional transfers. Pt provided with hand strengthening HEP with verbal instructions on completion. OT will continue to follow patient acutely.    Recommendations for follow up therapy are one component of a multi-disciplinary discharge planning process, led by the attending physician.  Recommendations may be updated based on patient status, additional functional criteria and insurance authorization.    Follow Up Recommendations  Skilled nursing-short term rehab (<3 hours/day)     Assistance Recommended at Discharge Frequent or constant Supervision/Assistance  Patient can return home with the following  A little help with walking and/or transfers;Assistance with cooking/housework;Assist for transportation;Help with  stairs or ramp for entrance;A lot of help with bathing/dressing/bathroom;Direct supervision/assist for medications management   Equipment Recommendations  BSC/3in1       Precautions / Restrictions Precautions Precautions: Fall;Back Precaution Comments: Back precautions for comfort Required Braces or Orthoses: Spinal Brace Spinal Brace: Lumbar corset;Applied in sitting position Restrictions Weight Bearing Restrictions: No       Mobility Bed Mobility Overal bed mobility: Modified Independent       Supine to sit: Modified independent (Device/Increase time), HOB elevated Sit to supine: Modified independent (Device/Increase time)     Patient Response: Flat affect, Cooperative  Transfers Overall transfer level: Needs assistance Equipment used: None Transfers: Sit to/from Stand Sit to Stand: Min guard     Balance Overall balance assessment: Needs assistance Sitting-balance support: Feet supported, No upper extremity supported Sitting balance-Leahy Scale: Fair     Standing balance support: Bilateral upper extremity supported, During functional activity Standing balance-Leahy Scale: Poor       ADL either performed or assessed with clinical judgement   ADL Overall ADL's : Needs assistance/impaired Eating/Feeding: Set up;Sitting   Grooming: Wash/dry hands;Wash/dry face;Oral care;Set up;Sitting   Upper Body Bathing: Set up;Sitting   Lower Body Bathing: Minimal assistance;Sit to/from stand;Sitting/lateral leans   Upper Body Dressing : Set up;Sitting   Lower Body Dressing: Minimal assistance;Sitting/lateral leans;Sit to/from stand   Toilet Transfer: Min guard;BSC/3in1;Rolling walker (2 wheels)   Toileting- Clothing Manipulation and Hygiene: Minimal assistance;Sit to/from stand        Extremity/Trunk Assessment Upper Extremity Assessment Upper Extremity Assessment: RUE deficits/detail;LUE deficits/detail RUE Deficits / Details: right shoulder pain with limited  A/ROM shoulder flexion to ~90 degrees. Elbow and wrist A/ROM [facility] in all ranges. Impaired gross grasp with inability to make a tight fist. RUE Coordination: decreased fine motor;decreased gross motor LUE Deficits / Details: left shoulder pain with  limited A/ROM shoulder flexion to ~90 degrees. Elbow and wrist A/ROM [facility] in all ranges. Impaired gross grasp with inability to make a tight fist. LUE Coordination: decreased fine motor;decreased gross motor            Vision Baseline Vision/History: 0 No visual deficits Ability to See in Adequate Light: 0 Adequate            Cognition Arousal/Alertness: Awake/alert Behavior During Therapy: Flat affect Overall Cognitive Status: Within Functional Limits for tasks assessed (previous sessions report impairment. Appropriate cognitive status during tasks this session)           Following Commands: Follows one step commands consistently          Exercises Other Exercises Other Exercises: Hand strengthening: Tan putty, grip, 3 point pinch, lateral pinch; 10X each hand. Verbal instructions provided for HEP.            Pertinent Vitals/ Pain       Pain Assessment Pain Assessment: Faces Faces Pain Scale: Hurts a little bit Pain Location: bilateral shoulders during mobility Pain Descriptors / Indicators: Grimacing, Discomfort, Sore Pain Intervention(s): Monitored during session         Frequency  Min 2X/week        Progress Toward Goals  OT Goals(current goals can now be found in the care plan section)  Progress towards OT goals: Progressing toward goals  Acute Rehab OT Goals Time For Goal Achievement: 08/07/22 Potential to Achieve Goals: Good  Plan Discharge plan remains appropriate;Frequency remains appropriate       AM-PAC OT "6 Clicks" Daily Activity     Outcome Measure   Help from another person eating meals?: A Little Help from another person taking care of personal grooming?: A Little Help from another person  toileting, which includes using toliet, bedpan, or urinal?: A Little Help from another person bathing (including washing, rinsing, drying)?: A Little Help from another person to put on and taking off regular upper body clothing?: A Little Help from another person to put on and taking off regular lower body clothing?: A Lot 6 Click Score: 17    End of Session    OT Visit Diagnosis: Other abnormalities of gait and mobility (R26.89);Muscle weakness (generalized) (M62.81);Pain Pain - Right/Left: Right (both) Pain - part of body: Shoulder   Activity Tolerance Patient tolerated treatment well   Patient Left in bed;with call bell/phone within reach           Time: 1156-1224 OT Time Calculation (min): 28 min  Charges: OT General Charges $OT Visit: 1 Visit OT Treatments $Self Care/Home Management : 8-22 mins $Therapeutic Activity: 8-22 mins  Ailene Ravel, OTR/L,CBIS  Supplemental OT - [facility] and [facility]   Antinette Keough, Clarene Duke 07/29/2022, 3:58 PM

## 2022-07-29 NOTE — Progress Notes (Signed)
Speech Language Pathology Treatment: Dysphagia  Patient Details Name: Wayne Stokes MRN: [id number] DOB: 21-Nov-1965 Today's Date: 07/29/2022 Time: 1115-1200 SLP Time Calculation (min) (ACUTE ONLY): 45 min  Assessment / Plan / Recommendation Clinical Impression  Visited pt with MD and interpreter at bedside also with home health RN on phone. Pt intake has remained poor. SLP observed pt today drink water without signs of asrpiation, also masticate orange slice and tolerate mashed potatoes. Had to encourage every bite. Pt pointed to neck at times and reported he was choking, though he was not coughing or throat clearing, no change in vocal quality. Swallow present. Tried to introduce concept of globus sensation and encourage pt from a safety standpoint. MD and SLP reiterated basic esophageal precautions, upright for meals, follow bites with sips, eat snacks often if a big meal is not possible. Overall there is no finding of oropharyngeal dysphagia and likely an underlying esophageal motility disorder with solids in particular, but no stricture that could be treated. Pt to f/u with palliative care team for goals of care in setting of malnutrition. SLP will sign off.    HPI HPI: Wayne Stokes is an 56 y.o. male from [place] who speaks [place]isl[place] Spire and was admitted for uncontrolled back pain and severe spinal lumbar stenosis orthopedic, neurosurgery evaluated the patient and recommended TLC L for pain control and outpatient follow-up.  Physical therapy recommended inpatient rehab difficult to place due to lack of insurance.  IR was consulted to perform kyphoplasty of T12 compression fracture on 06/18/2022. CT neck showed Moderate degenerative disc disease is noted at C4-5 and  C5-6.  Pt reprots difficulty swallowing, has severe weight loss  Esophagram 11/13: "Limited single contrast esophagram. Mild-to-moderate esophageal  dysmotility. No hiatal hernia or gastroesophageal reflux."  PMH: diabetes mellitus type 2,  essential hypertension with lumbar disc disease with spinal stenosis, T12 compression fracture and mild cognitive impairment      SLP Plan  Continue with current plan of care      Recommendations for follow up therapy are one component of a multi-disciplinary discharge planning process, led by the attending physician.  Recommendations may be updated based on patient status, additional functional criteria and insurance authorization.    Recommendations  Diet recommendations: Dysphagia 3 (mechanical soft);Thin liquid Liquids provided via: Cup;Straw Medication Administration: Crushed with puree Supervision: Patient able to self feed Compensations: Slow rate;Small sips/bites;Follow solids with liquid Postural Changes and/or Swallow Maneuvers: Seated upright 90 degrees;Upright 30-60 min after meal                Plan: Continue with current plan of care           [name], [name]  07/29/2022, 1:14 PM

## 2022-07-29 NOTE — Progress Notes (Signed)
PROGRESS NOTE    Wayne Stokes  YSA:[id number] DOB: 09-22-1965 DOA: 05/26/2022 PCP: Wayne Merino I, NP   Brief Narrative: 56 year old [place] Wayne Stokes speaking, past medical history significant for diabetes type 2, hypertension, anemia, lumbar DDD with spinal stenosis and claudication, T12 vertebral compression fracture and mild cognitive impairment who was admitted with uncontrolled lower back pain and ambulatory difficulty due to severe lumbar spinal stenosis and T12 compression fracture.  Neurosurgery and orthopedics were consulted recommended TLSO brace and pain control initially.  IR consulted for kyphoplasty, completed on 06/18/2022. There was some concern for right shoulder septic arthritis.  ID consulted, and orthopedic. IR aspirated joint with no finding suggestive of infection. Antibiotics were discontinued. Due to weight loss and poor oral intake he was seen by dietitian on 11/10 and started on core track tube feeds. He is thought to have esophageal dysmotility disorder, no evidence of oropharyngeal dysphagia per speech.  Plan to try dysphagia 2 diet. He will need Peg tube to sustain nutrition. Palliative will be consulted for goals of care.      Assessment & Plan:   Principal Problem:   Pyogenic arthritis of right shoulder region [facility]) Active Problems:   Spinal stenosis of lumbar region with neurogenic claudication   T12 compression fracture (HCC)   AKI (acute kidney injury) ([place])   Hyponatremia   Acute urinary retention   Type 2 diabetes mellitus without complication, without long-term current use of insulin (HCC)   Leukocytosis   Hypertension associated with diabetes ([place])   Iron deficiency anemia   Hyperlipidemia associated with type 2 diabetes mellitus (HCC)   Mild cognitive impairment   Thrush, oral   Right shoulder pain   Discitis of lumbar region   Chronic low back pain with bilateral sciatica   Protein-calorie malnutrition, severe   Oropharyngeal  dysphagia   1-Dysphagia Esophageal Dysmotility.  Poor oral Intake:  Severe Protein caloric Malnutrition:  -Evaluated by GI, does not recommend endoscopy. -Esophagogram; Esophageal dysmotility disorder.  -On core track until oral intake and nutrition improves.  -Patient report only been able to swallow liquids, after few more spoon he has to spit, vomit food, it gets stuck.  -Discussed with GI, patient probably has esophageal dysmotility disorder, no evince of stricture on esophagogram. If poor oral intake consider PEG tube placement.  Palliative care consulted for goals of care, he initially said no to peg, then said maybe or yes. Will need to involve family-friends.  Plan to transition tube feeding to Nocturnal.   Oral thrush: Continue with nystatin.  Hypoalbuminemia: Continue with tube feed  Hypomagnesemia:  Replaced.   T12 compression fracture, back pain Lumbar spinal stenosis MRI negative for cord injury Neurosurgery recommended conservative measure IR was consulted and underwent kyphoplasty on 10/11  Inflammatory Arthritis:  Chronic right Shoulder pain: Right Wrist pain;  -Right of the shoulder was concerning with septic arthritis.  ID was consulted and work-up was negative for any infection.  Antibiotics were discontinued. -He had aspiration of right shoulder by IR with negative synovial fluid. -MRI wrist showed inflammatory arthropathy versus septic arthritis. -ESR and CRP continue to be elevated. uric acid normal.  -Underwent aspiration right wrist: fluid negative for infection, culture no growth.  -Received Toradol for 2 days.  -Discontinue MS contin to avoid oversedation, Robaxin to PRN -Pain and swelling improved on prednisone. Patient has been using more right hand.  -RF elevated more than 323  -Cyclic Citrul peptide antibody pending.  -Discussed with Rheumatology from [facility], RA diagnosis is clinical  and with serology. RF can be elevated in multiples  condition like infection and inflammatory. Cyclic Citrul peptide antibody  is more specific for RA.  -Patient  could have inflammatory Arthritis-- will check cryoglobulin, Double strand DNA, Scleroderma antibody and Anti Jo.  -Will do prednisone taper by 5 every 5 days, start with dose of 20 mg. Patient will need follow up with Rheumatology.    Acute urinary retention: He has failed voiding trial 3 times. Continue with Flomax and Proscar.  Constipation: Bowel regimen held due to diarrhea.  Diarrhea resolved.   Anemia; likely of chronic disease, and acute illness:  Ferritin 914, iron 49, folic acid 10, B 12 7829 Repeat hb, Blood transfusion for hb less than 7  Nutrition Problem: Severe Malnutrition Etiology: acute illness    Signs/Symptoms: energy intake < or equal to 50% for > or equal to 5 days, severe fat depletion, severe muscle depletion, percent weight loss (20% wt loss in 2 months) Percent weight loss: 20 %    Interventions: Tube feeding  Estimated body mass index is 24.28 kg/m as calculated from the following:   Height as of this encounter: 5' (1.524 m).   Weight as of this encounter: 56.4 kg.   DVT prophylaxis: SCD Code Status: Full code Family Communication: Care discussed with patient.  Disposition Plan:  Status is: Inpatient Remains inpatient appropriate because: dysphagia, joint pain     Consultants:  ID Ortho  Procedures:    Antimicrobials:    Subjective: Patient seen with speech and translator. He report food  get stuck after few spoon. He has always eating small amount of food. He initially said not to Peg tube, then he said maybe. Stokes explain to him that he has esophageal dysmotility, he will need more time to eats, to drink water if that help getting food down.   His pain has improved.    Objective: Vitals:   07/28/22 1010 07/28/22 1504 07/29/22 0417 07/29/22 0500  BP: (!) 149/88 (!) 143/102 (!) 140/99   Pulse: 83 99 89   Resp: 16  18    Temp: 97.6 F (36.4 C) (!) 97.4 F (36.3 C) (!) 96.9 F (36.1 C)   TempSrc: Oral Oral    SpO2: 98% 97%    Weight:    56.4 kg  Height:        Intake/Output Summary (Last 24 hours) at 07/29/2022 0717 Last data filed at 07/29/2022 5621 Gross per 24 hour  Intake 360 ml  Output 2450 ml  Net -2090 ml    Filed Weights   07/22/22 0500 07/28/22 0447 07/29/22 0500  Weight: 54.8 kg 55.3 kg 56.4 kg    Examination:  General exam: NAD Respiratory system: CTA Cardiovascular system: S 1, S 2 RRR Gastrointestinal system: BS present, soft, nt Central nervous system: Alert, follows command Extremities:  Right hand with no edema.   Data Reviewed: Stokes have personally reviewed following labs and imaging studies  CBC: Recent Labs  Lab 07/24/22 0252 07/27/22 0235 07/28/22 0721  WBC 5.9 12.1* 10.0  HGB 8.0* 7.6* 7.1*  HCT 25.0* 23.8* 21.8*  MCV 69.6* 71.5* 69.6*  PLT 337 437* 494*    Basic Metabolic Panel: Recent Labs  Lab 07/23/22 0519 07/24/22 0252 07/26/22 0230 07/27/22 0235 07/28/22 0721  NA 133* 133* 131* 133* 138  K 3.8 4.3 5.3* 4.6 4.0  CL 101 98 99 101 106  CO2 _0 GLUCOSE 140* 144* 270* 235* 95  BUN 6 10  20 24* 23*  CREATININE 0.74 0.81 1.08 0.92 0.78  CALCIUM 9.1 9.4 9.6 8.8* 8.7*  MG 1.8 2.3 1.8  --   --   PHOS 4.1 4.9* 3.3  --   --     GFR: Estimated Creatinine Clearance: 72 mL/min (by C-G formula based on SCr of 0.78 mg/dL). Liver Function Tests: Recent Labs  Lab 07/23/22 0519 07/24/22 0252  AST 26 23  ALT 8 7  ALKPHOS 79 76  BILITOT 0.3 0.3  PROT 6.6 6.1*  ALBUMIN <1.5* <1.5*    No results for input(s): "LIPASE", "AMYLASE" in the last 168 hours. No results for input(s): "AMMONIA" in the last 168 hours. Coagulation Profile: No results for input(s): "INR", "PROTIME" in the last 168 hours. Cardiac Enzymes: No results for input(s): "CKTOTAL", "CKMB", "CKMBINDEX", "TROPONINI" in the last 168 hours. BNP (last 3 results) No results  for input(s): "PROBNP" in the last 8760 hours. HbA1C: No results for input(s): "HGBA1C" in the last 72 hours. CBG: Recent Labs  Lab 07/28/22 1136 07/28/22 1643 07/28/22 2004 07/29/22 0022 07/29/22 0610  GLUCAP 99 227* 194* 149* 116*    Lipid Profile: No results for input(s): "CHOL", "HDL", "LDLCALC", "TRIG", "CHOLHDL", "LDLDIRECT" in the last 72 hours. Thyroid Function Tests: No results for input(s): "TSH", "T4TOTAL", "FREET4", "T3FREE", "THYROIDAB" in the last 72 hours. Anemia Panel: No results for input(s): "VITAMINB12", "FOLATE", "FERRITIN", "TIBC", "IRON", "RETICCTPCT" in the last 72 hours. Sepsis Labs: No results for input(s): "PROCALCITON", "LATICACIDVEN" in the last 168 hours.  Recent Results (from the past 240 hour(s))  Aerobic/Anaerobic Culture w Gram Stain (surgical/deep wound)     Status: None (Preliminary result)   Collection Time: 07/24/22  2:51 PM   Specimen: PATH Cytology Misc. fluid; Body Fluid  Result Value Ref Range Status   Specimen Description FLUID RIGHT WRIST  Final   Special Requests NONE  Final   Gram Stain NO WBC SEEN NO ORGANISMS SEEN   Final   Culture   Final    NO GROWTH 4 DAYS NO ANAEROBES ISOLATED; CULTURE IN PROGRESS FOR 5 DAYS Performed at [facility], [address] [place], [place] [address]    Report Status PENDING  Incomplete         Radiology Studies: No results found.      Scheduled Meds:  atorvastatin  40 mg Per Tube Daily   Chlorhexidine Gluconate Cloth  6 each Topical Daily   colchicine  0.3 mg Per Tube Daily   donepezil  5 mg Per Tube QHS   feeding supplement  237 mL Per Tube BID BM   ferrous sulfate  300 mg Per Tube Q breakfast   finasteride  5 mg Oral Daily   gabapentin  200 mg Per Tube Q8H   insulin aspart  0-6 Units Subcutaneous TID WC   lidocaine  1 patch Transdermal Q24H   lidocaine  1 patch Transdermal Q24H   magnesium gluconate  500 mg Per Tube BID   metoprolol succinate  12.5 mg Oral Daily    mirtazapine  15 mg Per Tube QHS   nystatin  5 mL Oral QID   pantoprazole  40 mg Oral BID   predniSONE  30 mg Oral Q breakfast   tamsulosin  0.4 mg Oral QPC supper   thiamine  100 mg Per Tube Daily   Continuous Infusions:  sodium chloride 75 mL/hr at 07/28/22 1700   feeding supplement (OSMOLITE 1.5 CAL) 1,000 mL (07/28/22 1708)     LOS: 64  days    Time spent: 35 minutes.     Elmarie Shiley, MD [facility]   If 7PM-7AM, please contact night-coverage [email or web address]  07/29/2022, 7:17 AM

## 2022-07-29 NOTE — Plan of Care (Signed)
  Problem: Education: Goal: Knowledge of General Education information will improve Description: Including pain rating scale, medication(s)/side effects and non-pharmacologic comfort measures Outcome: Progressing   Problem: Health Behavior/Discharge Planning: Goal: Ability to manage health-related needs will improve Outcome: Progressing   Problem: Clinical Measurements: Goal: Ability to maintain clinical measurements within normal limits will improve Outcome: Progressing Goal: Diagnostic test results will improve Outcome: Progressing   Problem: Activity: Goal: Risk for activity intolerance will decrease Outcome: Progressing   Problem: Coping: Goal: Level of anxiety will decrease Outcome: Progressing   Problem: Elimination: Goal: Will not experience complications related to bowel motility Outcome: Progressing Goal: Will not experience complications related to urinary retention Outcome: Progressing   Problem: Pain Managment: Goal: General experience of comfort will improve Outcome: Progressing   Problem: Safety: Goal: Ability to remain free from injury will improve Outcome: Progressing   Problem: Skin Integrity: Goal: Risk for impaired skin integrity will decrease Outcome: Progressing   Problem: Education: Goal: Ability to describe self-care measures that may prevent or decrease complications (Diabetes Survival Skills Education) will improve Outcome: Progressing Goal: Individualized Educational Video(s) Outcome: Progressing   Problem: Coping: Goal: Ability to adjust to condition or change in health will improve Outcome: Progressing   Problem: Fluid Volume: Goal: Ability to maintain a balanced intake and output will improve Outcome: Progressing   Problem: Health Behavior/Discharge Planning: Goal: Ability to identify and utilize available resources and services will improve Outcome: Progressing Goal: Ability to manage health-related needs will improve Outcome:  Progressing   Problem: Metabolic: Goal: Ability to maintain appropriate glucose levels will improve Outcome: Progressing   Problem: Nutritional: Goal: Maintenance of adequate nutrition will improve Outcome: Progressing Goal: Progress toward achieving an optimal weight will improve Outcome: Progressing   Problem: Skin Integrity: Goal: Risk for impaired skin integrity will decrease Outcome: Progressing   Problem: Tissue Perfusion: Goal: Adequacy of tissue perfusion will improve Outcome: Progressing   

## 2022-07-30 DIAGNOSIS — Z7189 Other specified counseling: Secondary | ICD-10-CM

## 2022-07-30 DIAGNOSIS — E43 Unspecified severe protein-calorie malnutrition: Secondary | ICD-10-CM

## 2022-07-30 DIAGNOSIS — Z515 Encounter for palliative care: Secondary | ICD-10-CM

## 2022-07-30 LAB — CBC
HCT: 27 % — ABNORMAL LOW (ref 39.0–52.0)
Hemoglobin: 8.4 g/dL — ABNORMAL LOW (ref 13.0–17.0)
MCH: 22.3 pg — ABNORMAL LOW (ref 26.0–34.0)
MCHC: 31.1 g/dL (ref 30.0–36.0)
MCV: 71.6 fL — ABNORMAL LOW (ref 80.0–100.0)
Platelets: 606 10*3/uL — ABNORMAL HIGH (ref 150–400)
RBC: 3.77 MIL/uL — ABNORMAL LOW (ref 4.22–5.81)
RDW: 22.4 % — ABNORMAL HIGH (ref 11.5–15.5)
WBC: 10.5 10*3/uL (ref 4.0–10.5)
nRBC: 0.9 % — ABNORMAL HIGH (ref 0.0–0.2)

## 2022-07-30 LAB — BASIC METABOLIC PANEL
Anion gap: 4 — ABNORMAL LOW (ref 5–15)
BUN: 17 mg/dL (ref 6–20)
CO2: 30 mmol/L (ref 22–32)
Calcium: 8.3 mg/dL — ABNORMAL LOW (ref 8.9–10.3)
Chloride: 104 mmol/L (ref 98–111)
Creatinine, Ser: 0.62 mg/dL (ref 0.61–1.24)
GFR, Estimated: >60 mL/min (ref >60)
Glucose, Bld: 111 mg/dL — ABNORMAL HIGH (ref 70–99)
Potassium: 3.9 mmol/L (ref 3.5–5.1)
Sodium: 138 mmol/L (ref 135–145)

## 2022-07-30 LAB — GLUCOSE, CAPILLARY
Glucose-Capillary: 133 mg/dL — ABNORMAL HIGH (ref 70–99)
Glucose-Capillary: 138 mg/dL — ABNORMAL HIGH (ref 70–99)
Glucose-Capillary: 167 mg/dL — ABNORMAL HIGH (ref 70–99)
Glucose-Capillary: 163 mg/dL — ABNORMAL HIGH (ref 70–99)
Glucose-Capillary: 110 mg/dL — ABNORMAL HIGH (ref 70–99)

## 2022-07-30 LAB — ANTI-SCLERODERMA ANTIBODY: Scleroderma (Scl-70) (ENA) Antibody, IgG: <0.2 AI (ref 0.0–0.9)

## 2022-07-30 LAB — ANTI-JO 1 ANTIBODY, IGG: Anti JO-1: <0.2 AI (ref 0.0–0.9)

## 2022-07-30 LAB — ANTI-DNA ANTIBODY, DOUBLE-STRANDED: ds DNA Ab: <1 IU/mL (ref 0–9)

## 2022-07-30 NOTE — Progress Notes (Signed)
Nutrition Brief Note  RD received consult for calorie count to start today.   Discussed with RN.   Please hang calorie count envelope on the patient's door. Document percent consumed for each item on the patient's meal tray ticket and keep in envelope. Also document percent of any supplement or snack pt consumes and keep documentation in envelope for RD to review.   Results will follow up on Friday and provide results of calorie count at that time.   Drusilla Kanner, RDN, LDN Clinical Nutrition

## 2022-07-30 NOTE — Progress Notes (Signed)
Pt found multiple times with bed laid back past 30 degrees.  Pt given education each time.

## 2022-07-30 NOTE — Progress Notes (Addendum)
Physical Therapy Treatment Patient Details Name: Wayne Stokes MRN: [id number] DOB: 1966-04-09 Today's Date: 07/30/2022   History of Present Illness 56 year old male (speaking language [name]) admitted with uncontrolled lower back pain and ambulatory difficulty due to severe lumbar spinal stenosis and T12 compression fracture.  Neurosurgery and orthopedics were consulted recommended TLSO brace and pain control initially.  IR consulted for kyphoplasty, completed on 06/18/2022.  There was some concern for right shoulder septic arthritis. ID consulted, and orthopedic. IR aspirated joint with no finding suggestive of infection. Antibiotics were discontinued. Due to weight loss and poor oral intake he was seen by dietitian on 11/10 and started on cortrak tube feeds.  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    PT Comments    Pt received in recliner, attempting to get up unassisted despite call bell next to him in chair, RN not available so PTA assisted pt for transfers and additional gait trial in hallway as pt reporting eager to progress strength this date after premedication for pain between sessions and he had not ambulated far in previous session. Pt instructed on activity pacing, use of RW, transfer safety and close monitoring of VS. Pt needing up to minA with max safety/sequencing cues for gait and for transfers due to poor safety awareness. Pt resting in supine with bed alarm on for safety and x2 warm blankets at end of session. Pt continues to benefit from PT services to progress toward functional mobility goals.   Recommendations for follow up therapy are one component of a multi-disciplinary discharge planning process, led by the attending physician.  Recommendations may be updated based on patient status, additional functional criteria and insurance authorization.  Follow Up  Recommendations  Skilled nursing-short term rehab (<3 hours/day) Can patient physically be transported by private vehicle: Yes   Assistance Recommended at Discharge Frequent or constant Supervision/Assistance  Patient can return home with the following A little help with bathing/dressing/bathroom;Assistance with cooking/housework;Direct supervision/assist for medications management;Direct supervision/assist for financial management;Assist for transportation;Help with stairs or ramp for entrance;A lot of help with walking and/or transfers   Equipment Recommendations  BSC/3in1;Rollator (4 wheels)    Recommendations for Other Services       Precautions / Restrictions Precautions Precautions: Fall;Back Precaution Booklet Issued: No Precaution Comments: Back precautions for comfort Required Braces or Orthoses: Spinal Brace Spinal Brace: Lumbar corset;Applied in sitting position Restrictions Weight Bearing Restrictions: No     Mobility  Bed Mobility Overal bed mobility: Needs Assistance Bed Mobility: Rolling, Sit to Sidelying Rolling: Modified independent (Device/Increase time)     Sit to sidelying: Modified independent (Device/Increase time) General bed mobility comments: no physical assist needed    Transfers Overall transfer level: Needs assistance Equipment used: Rolling walker (2 wheels), None Transfers: Sit to/from Stand Sit to Stand: Min assist, Min guard     General transfer comment: cues for safe UE placement, pt at times pulls on RW; hand over hand assist to reach back when sitting on EOB for safety, minA for poor eccentric control to sit back down; min guard for standing from recliner    Ambulation/Gait Ambulation/Gait assistance: Min assist Gait Distance (Feet): 80 Feet Assistive device: Rolling walker (2 wheels) Gait Pattern/deviations: Step-through pattern, Decreased stride length, Trunk flexed, Drifts right/left Gait velocity: decreased             Balance Overall balance assessment: Needs assistance Sitting-balance support: Feet supported, No upper extremity supported  Sitting balance-Leahy Scale: Fair Sitting balance - Comments: impulsive to flex trunk >90*   Standing balance support: Bilateral upper extremity supported, During functional activity Standing balance-Leahy Scale: Poor Standing balance comment: Reliant on RW and external assist                            Cognition Arousal/Alertness: Awake/alert Behavior During Therapy: Flat affect, Impulsive (mildly impulsive) Overall Cognitive Status: Within Functional Limits for tasks assessed (previous sessions report impairment. Appropriate cognitive status during tasks this session) Area of Impairment: Safety/judgement, Following commands, Problem solving, Memory           Memory: Decreased recall of precautions Following Commands: Follows one step commands consistently, Follows multi-step commands inconsistently Safety/Judgement: Decreased awareness of safety Awareness: Emergent Problem Solving: Requires verbal cues General Comments: Pt following simple instructions well today, pt instructed prior to mobility not to bend >90* or twist at back but often forgetting during mobility; needs reinforcement. Pt expressing eagerness to progress strength/endurance and ambulate more today, and seems more aware of situation/less distracted by pain than in prior week sessions.           General Comments General comments (skin integrity, edema, etc.): HR remains elevated with exertional tasks, RN/MD aware      Pertinent Vitals/Pain Pain Assessment Pain Assessment: Faces Faces Pain Scale: Hurts even more Pain Location: low back Pain Descriptors / Indicators: Grimacing, Discomfort, Sore, Guarding Pain Intervention(s): Monitored during session, Repositioned, Premedicated before session           PT Goals (current goals can now be found in the care plan section)  Acute Rehab PT Goals Patient Stated Goal: to move around better, less pain PT Goal Formulation: With patient Time For Goal Achievement: 08/06/22 Progress towards PT goals: Progressing toward goals    Frequency    Min 3X/week      PT Plan Current plan remains appropriate       AM-PAC PT "6 Clicks" Mobility   Outcome Measure  Help needed turning from your back to your side while in a flat bed without using bedrails?: None Help needed moving from lying on your back to sitting on the side of a flat bed without using bedrails?: A Little Help needed moving to and from a bed to a chair (including a wheelchair)?: A Little Help needed standing up from a chair using your arms (e.g., wheelchair or bedside chair)?: A Little Help needed to walk in hospital room?: A Lot (mod safety cues) Help needed climbing 3-5 steps with a railing? : Total 6 Click Score: 16    End of Session Equipment Utilized During Treatment: Gait belt;Back brace Activity Tolerance: Patient tolerated treatment well Patient left: with call bell/phone within reach;in bed;with bed alarm set Nurse Communication: Mobility status;Precautions (tachycardia) PT Visit Diagnosis: Other abnormalities of gait and mobility (R26.89);Muscle weakness (generalized) (M62.81);Pain Pain - Right/Left: Right Pain - part of body:  (back)     Time: 1310-1320 PT Time Calculation (min) (ACUTE ONLY): 10 min  Charges:  $Gait Training: 8-22 mins                     Gwendloyn Forsee P., PTA Acute Rehabilitation Services Secure Chat Preferred 9a-5:30pm Office: [phone]    Angus Palms 07/30/2022, 2:43 PM

## 2022-07-30 NOTE — Progress Notes (Signed)
[facility] Progress Note  Patient: Wayne Stokes     [id number]  DOA: 05/26/2022   PCP: [name], NP       Brief hospital course: Is a 56 year old male from [place] who does not speak English and has diabetes mellitus, hypertension and anemia.  The patient presented to the hospital for uncontrolled lower back pain and difficulty ambulating. In the ED, he was noted to have a T12 compression fracture with 30% loss in height which appeared acute.  He was also found to have degenerative disc disease and facet arthrosis at L4-L5, severe spinal stenosis and severe bilateral L4 foraminal narrowing. On 06/18/2022 the patient underwent a kyphoplasty of T12.  During his hospital stay, he also admitted to chronic right shoulder pain, right wrist pain and was found to have severely poor oral intake.   11/10-core track feeding tube was placed due to continued poor oral intake and dysphagia  Subjective:  Patient complains of ongoing pain and bilateral shoulders.  He has no other complaints.  Assessment and Plan: Principal Problem:   T12 compression fracture  Lumbar spinal stenosis -Status post kyphoplasty - He will transition to skilled nursing facility upon discharge  Active Problems: Dysphagia and poor oral intake-severe protein calorie malnutrition - Patient has severe muscle and fat depletion and had poor oral intake prior to the hospital stay - The patient ate 75% of his breakfast today - I have discontinued his tube feeds and his IV fluids and we have had a long discussion about trying to improve his oral intake - The patient currently states that he does not want a PEG tube - We will leave core track in place in case oral intake remains poor and at that point, we will have to have further discussions regarding a PEG tube  Urinary retention - The patient has failed a voiding trial 3 times and will need to discharge with a Foley catheter and follow-up with urology as  outpatient - Continue Flomax and Proscar  Chronic right shoulder pain and right wrist pain - MRI imaging on 06/04/2022 revealed a large complex joint effusion with thick synovial enhancement suggesting septic arthritis, septic subacromial and subdeltoid bursitis and probable septic tendinopathy involving the rotator cuff tendons and the long head of the biceps tendon and surrounding myofasciitis -MRI imaging of the right wrist on 07/09/2022 revealed a joint effusion, synovitis, suspected erosive changes and marrow edema suggestive of inflammatory versus septic arthritis - Further work-up done including aspiration of the right wrist on 11/16-no signs of septic arthritis - CCP antibody noted to be elevated, rheumatoid factor also elevated - Double-stranded DNA scleroderma antibody and anti-Jo-ordered and are pending - The patient is currently receiving prednisone with a plan to taper - He will need to follow-up with rheumatology as outpatient     Type 2 diabetes mellitus without complication, without long-term current use of insulin ([place]) - Continue NovoLog while in the hospital - Most recent A1c on 06/28/2022 was 6.2   Essential hypertension - Continue Toprol  Mild cognitive impairment - Continue Aricept -On the most part, the patient appears to understand his medical issues  Social concerns: I have had an extensive conversation with the patient today via an interpreter.  Due to the patient's back and shoulder pain, he is no longer able to work and no longer able to cook for himself.  He states that he lives with a roommate who may not be able to take him back if he continues to be  unable to work.  He will ultimately need social work assistance as he will likely be homeless once he is discharged from the skilled nursing facility.  I have discussed this with our Child psychotherapist.    Code Status: Full Code DVT prophylaxis:  SCDs Start: 05/26/22 2254    Consultants: GI, infectious disease,  radiology Level of Care: Level of care: Med-Surg Total time on patient care: 45 min   Objective:   Vitals:   07/29/22 2010 07/30/22 0721 07/30/22 1300 07/30/22 1340  BP: 126/87 (!) 140/96    Pulse: (!) 102 (!) 104 (!) 148 86  Resp:      Temp: 97.9 F (36.6 C) (!) 97.5 F (36.4 C)    TempSrc: Oral Oral    SpO2: 98% 99% 100%   Weight:      Height:       Filed Weights   07/22/22 0500 07/28/22 0447 07/29/22 0500  Weight: 54.8 kg 55.3 kg 56.4 kg   Exam: General exam: Appears comfortable  HEENT: oral mucosa moist Respiratory system: Clear to auscultation.  Cardiovascular system: S1 & S2 heard  Gastrointestinal system: Abdomen soft, non-tender, nondistended. Normal bowel sounds   Extremities: No cyanosis, clubbing or edema Psychiatry:  Mood & affect appropriate.      CBC: Recent Labs  Lab 07/24/22 0252 07/27/22 0235 07/28/22 0721 07/29/22 0813 07/30/22 0438  WBC 5.9 12.1* 10.0 7.8 10.5  HGB 8.0* 7.6* 7.1* 7.2* 8.4*  HCT 25.0* 23.8* 21.8* 21.9* 27.0*  MCV 69.6* 71.5* 69.6* 70.2* 71.6*  PLT 337 437* 494* 487* 606*   Basic Metabolic Panel: Recent Labs  Lab 07/24/22 0252 07/26/22 0230 07/27/22 0235 07/28/22 0721 07/29/22 0813 07/30/22 0438  NA 133* 131* 133* 138 137 138  K 4.3 5.3* 4.6 4.0 3.6 3.9  CL 98 99 101 106 103 104  CO2 27 26 24 26 28 30   GLUCOSE 144* 270* 235* 95 113* 111*  BUN 10 20 24* 23* 18 17  CREATININE 0.81 1.08 0.92 0.78 0.73 0.62  CALCIUM 9.4 9.6 8.8* 8.7* 8.3* 8.3*  MG 2.3 1.8  --   --   --   --   PHOS 4.9* 3.3  --   --   --   --    GFR: Estimated Creatinine Clearance: 72 mL/min (by C-G formula based on SCr of 0.62 mg/dL).  Scheduled Meds:  atorvastatin  40 mg Per Tube Daily   Chlorhexidine Gluconate Cloth  6 each Topical Daily   colchicine  0.3 mg Per Tube Daily   donepezil  5 mg Per Tube QHS   ferrous sulfate  300 mg Per Tube Q breakfast   finasteride  5 mg Oral Daily   gabapentin  200 mg Per Tube Q8H   insulin aspart  0-6  Units Subcutaneous TID WC   lidocaine  1 patch Transdermal Q24H   lidocaine  1 patch Transdermal Q24H   magnesium gluconate  500 mg Per Tube BID   metoprolol succinate  12.5 mg Oral Daily   mirtazapine  15 mg Per Tube QHS   pantoprazole  40 mg Oral BID   predniSONE  20 mg Oral Q breakfast   Followed by   ON 08/04/2022] predniSONE  15 mg Oral Q breakfast   Followed by   08/06/2022 ON 08/09/2022] predniSONE  10 mg Oral Q breakfast   Followed by   14/10/2021 ON 08/14/2022] predniSONE  5 mg Oral Q breakfast   tamsulosin  0.4 mg Oral QPC supper  thiamine  100 mg Per Tube Daily   Continuous Infusions: Imaging and lab data was personally reviewed No results found.  LOS: 65 days   Author: Debbe Odea  07/30/2022 2:28 PM  To contact [facility]>   Check the care team in [facility] and look for the attending/consulting [facility] provider listed  Log into [email or web address] and use [place]'s universal password   Go to> "[facility]"  and find provider  If you still have difficulty reaching the provider, please page the [facility] (Director on Call) for the Hospitalists listed on amion

## 2022-07-30 NOTE — Progress Notes (Signed)
Physical Therapy Treatment Patient Details Name: Wayne Stokes MRN: [id number] DOB: 03-10-1966 Today's Date: 07/30/2022   History of Present Illness 56 year old male (speaking language [name]) admitted with uncontrolled lower back pain and ambulatory difficulty due to severe lumbar spinal stenosis and T12 compression fracture.  Neurosurgery and orthopedics were consulted recommended TLSO brace and pain control initially.  IR consulted for kyphoplasty, completed on 06/18/2022.  There was some concern for right shoulder septic arthritis. ID consulted, and orthopedic. IR aspirated joint with no finding suggestive of infection. Antibiotics were discontinued. Due to weight loss and poor oral intake he was seen by dietitian on 11/10 and started on cortrak tube feeds.  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    PT Comments    Pt received in supine, agreeable to therapy session and requesting to use bathroom at beginning of session. Pt making good progress toward goals, limited due to fatigue but checked orthostatics and BP stable sitting/standing at 1 minute, plan to check again standing after 3-5 minutes next session to ensure stability, pt HR elevated to 148 bpm with standing tasks/exertion, updated in flowsheet. Pt needing up to min guard for bed mobility/transfers with increased time/effort and still needs assist to don/doff back brace for comfort. Pt needing minA for gait due to difficulty managing RW at times. Pt reports moderate pain but increased to severe pain/fatigue at end of session and pt c/o both ears "feeling heavy". No nystagmus observed. Pt continues to benefit from PT services to progress toward functional mobility goals. Anticipate pt would be able to transport via private vehicle once D/C to SNF, according to current progress, discussed with supervising PT and updated below.    Recommendations for follow up therapy are one component of a multi-disciplinary discharge planning process, led by the attending physician.  Recommendations may be updated based on patient status, additional functional criteria and insurance authorization.  Follow Up Recommendations  Skilled nursing-short term rehab (<3 hours/day) Can patient physically be transported by private vehicle: Yes   Assistance Recommended at Discharge Frequent or constant Supervision/Assistance  Patient can return home with the following A little help with bathing/dressing/bathroom;Assistance with cooking/housework;Direct supervision/assist for medications management;Direct supervision/assist for financial management;Assist for transportation;Help with stairs or ramp for entrance;A lot of help with walking and/or transfers   Equipment Recommendations  BSC/3in1;Rollator (4 wheels)    Recommendations for Other Services       Precautions / Restrictions Precautions Precautions: Fall;Back Precaution Booklet Issued: No Precaution Comments: Back precautions for comfort Required Braces or Orthoses: Spinal Brace Spinal Brace: Lumbar corset;Applied in sitting position Restrictions Weight Bearing Restrictions: No     Mobility  Bed Mobility Overal bed mobility: Needs Assistance Bed Mobility: Rolling, Sidelying to Sit Rolling: Modified independent (Device/Increase time) Sidelying to sit: Supervision       General bed mobility comments: good recall of back precs from previous session, supervision wtih min cues for safety and pt needing assist with linens/lines    Transfers Overall transfer level: Needs assistance Equipment used: Rolling walker (2 wheels), None Transfers: Sit to/from Stand Sit to Stand: Min guard           General transfer comment: cues for safe UE placement, pt at times pulls on RW; able to perform x5 reps from chair reciprocal with min guard also from low toilet seat with cues to use  wall rail    Ambulation/Gait Ambulation/Gait assistance: Min assist Gait  Distance (Feet): 25 Feet (17ft x2 with seated break in bathroom) Assistive device: Rolling walker (2 wheels) Gait Pattern/deviations: Step-through pattern, Decreased stride length, Trunk flexed, Drifts right/left Gait velocity: decreased            Balance Overall balance assessment: Needs assistance Sitting-balance support: Feet supported, No upper extremity supported Sitting balance-Leahy Scale: Fair Sitting balance - Comments: impulsive to flex trunk >90*   Standing balance support: Bilateral upper extremity supported, During functional activity Standing balance-Leahy Scale: Poor Standing balance comment: Reliant on RW and external assist                            Cognition Arousal/Alertness: Awake/alert Behavior During Therapy: Flat affect, Impulsive (mildly impulsive) Overall Cognitive Status: Within Functional Limits for tasks assessed (previous sessions report impairment. Appropriate cognitive status during tasks this session) Area of Impairment: Safety/judgement, Following commands, Problem solving, Memory                     Memory: Decreased recall of precautions Following Commands: Follows one step commands consistently, Follows multi-step commands inconsistently Safety/Judgement: Decreased awareness of safety Awareness: Emergent Problem Solving: Requires verbal cues General Comments: Pt following simple instructions well today, pt instructed prior to mobility not to bend >90* or twist at back but often forgetting during mobility; needs reinforcement. Pt expressing eagerness to progress strength/endurance and ambulate more today, and seems more aware of situation/less distracted by pain than in prior week sessions.        Exercises General Exercises - Lower Extremity Long Arc Quad: AROM, Both, 15 reps, Seated Heel Slides: AROM, Both, 5 reps, Supine Hip  Flexion/Marching: AROM, Both, Seated, 20 reps Mini-Sqauts: AROM, 5 reps, Standing (pain limiting) Other Exercises Other Exercises: standing HS curls x5 reps ea, fatigue limiting Other Exercises: STS x 5 reps reciprocal from chair for strengthening    General Comments General comments (skin integrity, edema, etc.): BP 127/102 sitting post-exertion, 139/107 (118) standing (pt c/o ears "feeling heavy") HR to 148 bpm with gait/standing and 110-120's bpm sitting; SpO2 WFL on RA      Pertinent Vitals/Pain Pain Assessment Pain Assessment: Faces Faces Pain Scale: Hurts little more Pain Location: low back Pain Descriptors / Indicators: Grimacing, Discomfort, Sore, Guarding Pain Intervention(s): Monitored during session, Repositioned, Patient requesting pain meds-RN notified           PT Goals (current goals can now be found in the care plan section) Acute Rehab PT Goals Patient Stated Goal: to move around better, less pain PT Goal Formulation: With patient Time For Goal Achievement: 08/06/22 Progress towards PT goals: Progressing toward goals    Frequency    Min 3X/week      PT Plan Current plan remains appropriate       AM-PAC PT "6 Clicks" Mobility   Outcome Measure  Help needed turning from your back to your side while in a flat bed without using bedrails?: None Help needed moving from lying on your back to sitting on the side of a flat bed without using bedrails?: A Little Help needed moving to and from a bed to a chair (including a wheelchair)?: A Little Help needed standing up from a chair using your arms (e.g., wheelchair or bedside chair)?: A Little Help needed to walk in hospital room?: A Lot (mod safety cues) Help needed climbing 3-5 steps with a railing? : Total 6 Click Score: 16    End of Session Equipment  Utilized During Treatment: Gait belt;Back brace Activity Tolerance: Patient tolerated treatment well Patient left: in chair;with call bell/phone within  reach;with chair alarm set Nurse Communication: Mobility status;Patient requests pain meds;Precautions PT Visit Diagnosis: Other abnormalities of gait and mobility (R26.89);Muscle weakness (generalized) (M62.81);Pain Pain - Right/Left: Right Pain - part of body:  (back)     Time: 8546-2703 PT Time Calculation (min) (ACUTE ONLY): 28 min  Charges:  $Gait Training: 8-22 mins $Therapeutic Exercise: 8-22 mins                     Karlei Waldo P., PTA Acute Rehabilitation Services Secure Chat Preferred 9a-5:30pm Office: [phone]    Dorathy Kinsman [facility] 07/30/2022, 2:30 PM

## 2022-07-30 NOTE — Progress Notes (Signed)
     Consult received. Chart reviewed. Discussed with Dr. Sunnie Nielsen by phone.  I spoke with interpreter services [phone]) in efforts to arrange in-person [place] interpreter to assist with goals of care discussion.  Interpreter services will call me back in the morning to let me know what time the interpreter can be present.    Sherlean Foot, NP-C Palliative Medicine   Please call Palliative Medicine team phone with any questions [phone]. For individual providers please see AMION.   No charge

## 2022-07-30 NOTE — Consult Note (Cosign Needed Addendum)
Palliative Care Consult Note                                  Date: 07/30/2022   Patient Name: Wayne Stokes  DOB: 1965-12-28  MRN: [id number]  Age / Sex: 56 y.o., male  PCP: Teena Dunk, NP Referring Physician: Debbe Odea, MD  Reason for Consultation: Establishing goals of care  HPI/Patient Profile: 56 y.o. male  with past medical history of diabetes type 2, hypertension, anemia, lumbar DDD with spinal stenosis and claudication, T12 vertebral compression fracture, and mild cognitive impairment presented to [facility] ED on 05/26/2022 with uncontrolled lower back pain and difficulty walking.  Neurosurgery and orthopedics were consulted and initially recommended TLSO brace and pain control.  IR was consulted for kyphoplasty, which was completed on 06/18/2022.  There was concern for right shoulder septic arthritis and ID was consulted.  IR aspirated this joint with no findings suggestive of infection. Palliative Medicine has been consulted for goals of care in the setting of severe protein-calorie nutrition.    Subjective:   I have reviewed medical records including progress notes, labs and imaging, and arranged in-person interpreter services.  Per chart review, there is a case worker [name]) listed under DPR. I spoke with [name] by phone. He has been assigned to patient since last year but is not able to tell me much about his situation.   Dr. Wynelle Cleveland and myself met today at bedside with patient. In-person [name] interpreter [name] was present throughout the encounter.  Patient has just returned to his room after ambulating in the hallway. He reports ongoing right shoulder pain, but that it has improved. He reports having an appetite.   Patient was born in [place]. He has no family, other than someone who he refers to as his "niece", but the interpreter explains is actually his cousin (their mothers were sisters). This niece lives in  [place], but patient cannot tell us her full name or phone number. He reports she may visit this weekend. We give him paper and ask him to have her write down her contact information.   Dr. Wynelle Cleveland offered education on patient's current medical conditions.  With regard to malnutrition, it is explained that if he does not eat enough then he will need a permanent feeding tube. Emphasized that without nutrition, he will become progressively weaker and more debilitated.   With regard to right shoulder pain, it it explained that he may have rheumatoid arthritis and will need to see a specialist outside the hospital. It is explained he will need to keep taking the special medication until he can see the specialist.   When asked to repeat information back, it appears patient is only understanding the bare minimum of what he has been told.   Further discussion was had regarding patient's social situation in the context of his medical conditions. He reports he has been living with a friend, but is unsure he can return there. He previously worked in [employer], but can no longer work due to pain. He has also been able to prepare food due to pain, which is likely contributing to malnutrition.   The interpreter shares that there is very little assistance available within the [place] community. He explains that there is a Equities trader [name]) that offers services one day per week at the [employer]. It is unclear   There are multiple social issues complicating  this situation.   Code status discussion was deferred today.   Review of Systems  Musculoskeletal:        R shoulder pain    Objective:   Primary Diagnoses: Present on Admission:  Spinal stenosis of lumbar region with neurogenic claudication  T12 compression fracture (HCC)  Hyponatremia  AKI (acute kidney injury) ([place])  Iron deficiency anemia  Hypertension associated with diabetes ([place])  Hyperlipidemia associated  with type 2 diabetes mellitus (HCC)  Mild cognitive impairment  Leukocytosis  Acute urinary retention   Physical Exam Vitals reviewed.  Constitutional:      General: He is not in acute distress.    Comments: Chronically ill-appearing Frail  HENT:     Head:     Comments: Cortrak in place Pulmonary:     Effort: Pulmonary effort is normal.  Neurological:     Mental Status: He is alert and oriented to person, place, and time.     Vital Signs:  BP (!) 140/96 (BP Location: Left Arm)   Pulse (!) 104   Temp (!) 97.5 F (36.4 C) (Oral)   Resp 18   Ht 5' (1.524 m)   Wt 56.4 kg   SpO2 99%   BMI 24.28 kg/m   Palliative Assessment/Data: PPS 50%     Assessment & Plan:   SUMMARY OF RECOMMENDATIONS   Full code - discussion was deferred Continue full scope interventions Very poor social situation Contact information for case manager and community nurse has been added to demographics Need to obtain contact information for "niece" (cousin) and see if she is willing to serve as patient's health care agent  This NP will follow-up on Sunday 11/26, please call [phone] for any urgent needs.   Primary Decision Maker: PATIENT  Prognosis:  Unable to determine  Discharge Planning:  To Be Determined    Thank you for allowing the Palliative Medicine Team to assist in the care of this patient.   Greater than 50%  of this time was spent counseling and coordinating care related to the above assessment and plan.  Total time: 90 minutes   [name], NP Palliative Medicine   Please contact Palliative Medicine Team phone at [phone] for questions and concerns.  For individual provider, see AMION.

## 2022-07-31 LAB — GLUCOSE, CAPILLARY
Glucose-Capillary: 125 mg/dL — ABNORMAL HIGH (ref 70–99)
Glucose-Capillary: 132 mg/dL — ABNORMAL HIGH (ref 70–99)
Glucose-Capillary: 189 mg/dL — ABNORMAL HIGH (ref 70–99)
Glucose-Capillary: 220 mg/dL — ABNORMAL HIGH (ref 70–99)
Glucose-Capillary: 65 mg/dL — ABNORMAL LOW (ref 70–99)
Glucose-Capillary: 77 mg/dL (ref 70–99)
Glucose-Capillary: 84 mg/dL (ref 70–99)

## 2022-07-31 MED ORDER — LOSARTAN POTASSIUM 50 MG PO TABS
50.0000 mg | ORAL_TABLET | Freq: Every day | ORAL | Status: DC
  Administered 2022-07-31 – 2022-08-01 (×2): 50 mg via ORAL
  Filled 2022-07-31 (×2): qty 1

## 2022-07-31 MED ORDER — IBUPROFEN 200 MG PO TABS: 400.0000 mg | ORAL_TABLET | ORAL | Status: DC | PRN

## 2022-07-31 NOTE — Progress Notes (Signed)
[facility] Progress Note  Patient: Wayne Stokes     WUJ:[id number]  DOA: 05/26/2022   PCP: [name], NP       Brief hospital course: Is a 56 year old male from [place] who does not speak English and has diabetes mellitus, hypertension and anemia.  The patient presented to the hospital for uncontrolled lower back pain and difficulty ambulating. In the ED, he was noted to have a T12 compression fracture with 30% loss in height which appeared acute.  He was also found to have degenerative disc disease and facet arthrosis at L4-L5, severe spinal stenosis and severe bilateral L4 foraminal narrowing. On 06/18/2022 the patient underwent a kyphoplasty of T12.  During his hospital stay, he also admitted to chronic right shoulder pain, right wrist pain and was found to have severely poor oral intake.   11/10-core track feeding tube was placed due to continued poor oral intake and dysphagia  Subjective:  Complains of lower back pain today.  He has no other complaints.  Assessment and Plan: Principal Problem:   T12 compression fracture  Lumbar spinal stenosis -Status post kyphoplasty - He will transition to skilled nursing facility upon discharge  Active Problems: Dysphagia and poor oral intake-severe protein calorie malnutrition - Patient has severe muscle and fat depletion and had poor oral intake prior to the hospital stay - 11/22> discontinued his tube feeds and his IV fluids and we have had a long discussion about trying to improve his oral intake - 11/23- has excellent PO intake- dc cortrak today   Urinary retention - The patient has failed a voiding trial 3 times and will need to discharge with a Foley catheter and follow-up with urology as outpatient - Continue Flomax and Proscar  Chronic right shoulder pain and right wrist pain - MRI imaging on 06/04/2022 revealed a large complex joint effusion with thick synovial enhancement suggesting septic arthritis, septic  subacromial and subdeltoid bursitis and probable septic tendinopathy involving the rotator cuff tendons and the long head of the biceps tendon and surrounding myofasciitis -MRI imaging of the right wrist on 07/09/2022 revealed a joint effusion, synovitis, suspected erosive changes and marrow edema suggestive of inflammatory versus septic arthritis - Further work-up done including aspiration of the right wrist on 11/16-no signs of septic arthritis - CCP antibody noted to be elevated, rheumatoid factor also elevated - Double-stranded DNA scleroderma antibody and anti-Jo-ordered and are pending - The patient is currently receiving prednisone with a plan to taper - He will need to follow-up with rheumatology as outpatient     Type 2 diabetes mellitus without complication, without long-term current use of insulin (HCC) - Continue NovoLog while in the hospital - Most recent A1c on 06/28/2022 was 6.2   Essential hypertension - Continue Toprol and Losaran  Mild cognitive impairment - Continue Aricept -On the most part, the patient appears to understand his medical issues  Social concerns: I have had an extensive conversation with the patient today via an interpreter.  Due to the patient's back and shoulder pain, he is no longer able to work and no longer able to cook for himself.  He states that he lives with a roommate who may not be able to take him back if he continues to be unable to work.  He will ultimately need social work assistance as he will likely be homeless once he is discharged from the skilled nursing facility.  I have discussed this with our Child psychotherapist.    Code Status: Full Code  DVT prophylaxis:  SCDs Start: 05/26/22 2254    Consultants: GI, infectious disease, radiology Level of Care: Level of care: Med-Surg Total time on patient care: 35 min   Objective:   Vitals:   07/30/22 1340 07/30/22 1448 07/30/22 2025 07/31/22 0742  BP:  138/86 (!) 127/90 (!) 134/91  Pulse: 86 65  83 79  Resp:   16 17  Temp:  98.3 F (36.8 C) 98 F (36.7 C) 98.8 F (37.1 C)  TempSrc:  Oral Oral Oral  SpO2:  97% 98% 95%  Weight:      Height:       Filed Weights   07/22/22 0500 07/28/22 0447 07/29/22 0500  Weight: 54.8 kg 55.3 kg 56.4 kg   Exam: General exam: Appears comfortable  HEENT: oral mucosa moist Respiratory system: Clear to auscultation.  Cardiovascular system: S1 & S2 heard  Gastrointestinal system: Abdomen soft, non-tender, nondistended. Normal bowel sounds   Extremities: No cyanosis, clubbing or edema Psychiatry:  Mood & affect appropriate.      CBC: Recent Labs  Lab 07/27/22 0235 07/28/22 0721 07/29/22 0813 07/30/22 0438  WBC 12.1* 10.0 7.8 10.5  HGB 7.6* 7.1* 7.2* 8.4*  HCT 23.8* 21.8* 21.9* 27.0*  MCV 71.5* 69.6* 70.2* 71.6*  PLT 437* 494* 487* 606*    Basic Metabolic Panel: Recent Labs  Lab 07/26/22 0230 07/27/22 0235 07/28/22 0721 07/29/22 0813 07/30/22 0438  NA 131* 133* 138 137 138  K 5.3* 4.6 4.0 3.6 3.9  CL 99 101 106 103 104  CO2 26 24 26 28 30   GLUCOSE 270* 235* 95 113* 111*  BUN 20 24* 23* 18 17  CREATININE 1.08 0.92 0.78 0.73 0.62  CALCIUM 9.6 8.8* 8.7* 8.3* 8.3*  MG 1.8  --   --   --   --   PHOS 3.3  --   --   --   --     GFR: Estimated Creatinine Clearance: 72 mL/min (by C-G formula based on SCr of 0.62 mg/dL).  Scheduled Meds:  atorvastatin  40 mg Per Tube Daily   Chlorhexidine Gluconate Cloth  6 each Topical Daily   colchicine  0.3 mg Per Tube Daily   donepezil  5 mg Per Tube QHS   ferrous sulfate  300 mg Per Tube Q breakfast   finasteride  5 mg Oral Daily   gabapentin  200 mg Per Tube Q8H   insulin aspart  0-6 Units Subcutaneous TID WC   lidocaine  1 patch Transdermal Q24H   lidocaine  1 patch Transdermal Q24H   magnesium gluconate  500 mg Per Tube BID   metoprolol succinate  12.5 mg Oral Daily   mirtazapine  15 mg Per Tube QHS   pantoprazole  40 mg Oral BID   predniSONE  20 mg Oral Q breakfast    Followed by   ON 08/04/2022] predniSONE  15 mg Oral Q breakfast   Followed by   08/06/2022 ON 08/09/2022] predniSONE  10 mg Oral Q breakfast   Followed by   14/10/2021 ON 08/14/2022] predniSONE  5 mg Oral Q breakfast   tamsulosin  0.4 mg Oral QPC supper   thiamine  100 mg Per Tube Daily   Continuous Infusions: Imaging and lab data was personally reviewed No results found.  LOS: 66 days   Author: 14/03/2022  07/31/2022 11:45 AM  To contact [facility]>   Check the care team in [facility] and look for the attending/consulting [facility] provider listed  Log  into [email or web address] and use [place]'s universal password   Go to> "[facility]"  and find provider  If you still have difficulty reaching the provider, please page the [facility] (Director on Call) for the Hospitalists listed on amion

## 2022-07-31 NOTE — Progress Notes (Addendum)
Mobility Specialist Progress Note   07/31/22 1030  Mobility  Activity Ambulated with assistance in hallway  Level of Assistance Contact guard assist, steadying assist  Assistive Device Front wheel walker  Distance Ambulated (ft) 280 ft  Activity Response Tolerated well  $Mobility charge 1 Mobility   Received pt on EOB having slight c/o back pain but eager to walk this morning. MinA to donn brace but CGA during ambulation. X1 seated break for energy conservation. Returned to room w/o fault, left on EOB w/ needs met and call bell in reach.   Holland Falling Mobility Specialist Acute Rehab Office:  [phone]

## 2022-07-31 NOTE — Plan of Care (Signed)
  Problem: Education: Goal: Knowledge of General Education information will improve Description: Including pain rating scale, medication(s)/side effects and non-pharmacologic comfort measures Outcome: Not Progressing   Problem: Health Behavior/Discharge Planning: Goal: Ability to manage health-related needs will improve Outcome: Not Progressing   Problem: Clinical Measurements: Goal: Ability to maintain clinical measurements within normal limits will improve Outcome: Not Progressing Goal: Diagnostic test results will improve Outcome: Not Progressing   Problem: Activity: Goal: Risk for activity intolerance will decrease Outcome: Not Progressing   Problem: Coping: Goal: Level of anxiety will decrease Outcome: Not Progressing   Problem: Elimination: Goal: Will not experience complications related to bowel motility Outcome: Not Progressing Goal: Will not experience complications related to urinary retention Outcome: Not Progressing   Problem: Pain Managment: Goal: General experience of comfort will improve Outcome: Not Progressing   Problem: Safety: Goal: Ability to remain free from injury will improve Outcome: Not Progressing   Problem: Skin Integrity: Goal: Risk for impaired skin integrity will decrease Outcome: Not Progressing   Problem: Education: Goal: Ability to describe self-care measures that may prevent or decrease complications (Diabetes Survival Skills Education) will improve Outcome: Not Progressing Goal: Individualized Educational Video(s) Outcome: Not Progressing   Problem: Coping: Goal: Ability to adjust to condition or change in health will improve Outcome: Not Progressing   Problem: Fluid Volume: Goal: Ability to maintain a balanced intake and output will improve Outcome: Not Progressing   Problem: Health Behavior/Discharge Planning: Goal: Ability to identify and utilize available resources and services will improve Outcome: Not Progressing Goal:  Ability to manage health-related needs will improve Outcome: Not Progressing   Problem: Metabolic: Goal: Ability to maintain appropriate glucose levels will improve Outcome: Not Progressing   Problem: Nutritional: Goal: Maintenance of adequate nutrition will improve Outcome: Not Progressing Goal: Progress toward achieving an optimal weight will improve Outcome: Not Progressing   Problem: Skin Integrity: Goal: Risk for impaired skin integrity will decrease Outcome: Not Progressing   Problem: Tissue Perfusion: Goal: Adequacy of tissue perfusion will improve Outcome: Not Progressing

## 2022-08-01 ENCOUNTER — Other Ambulatory Visit: Payer: Self-pay

## 2022-08-01 DIAGNOSIS — I10 Essential (primary) hypertension: Secondary | ICD-10-CM

## 2022-08-01 DIAGNOSIS — M069 Rheumatoid arthritis, unspecified: Secondary | ICD-10-CM

## 2022-08-01 LAB — GLUCOSE, CAPILLARY
Glucose-Capillary: 147 mg/dL — ABNORMAL HIGH (ref 70–99)
Glucose-Capillary: 73 mg/dL (ref 70–99)
Glucose-Capillary: 78 mg/dL (ref 70–99)
Glucose-Capillary: 93 mg/dL (ref 70–99)

## 2022-08-01 MED ORDER — PANTOPRAZOLE SODIUM 40 MG PO TBEC: 40.0000 mg | DELAYED_RELEASE_TABLET | Freq: Every day | ORAL | Status: AC

## 2022-08-01 MED ORDER — OXYCODONE-ACETAMINOPHEN 5-325 MG PO TABS: 1.0000 | ORAL_TABLET | ORAL | 0 refills | Status: AC | PRN

## 2022-08-01 MED ORDER — METOPROLOL SUCCINATE ER 25 MG PO TB24
12.5000 mg | ORAL_TABLET | Freq: Every day | ORAL | 2 refills | Status: DC
  Filled 2022-08-01: qty 15, 30d supply, fill #0

## 2022-08-01 MED ORDER — FERROUS SULFATE 300 (60 FE) MG/5ML PO SOLN
300.0000 mg | Freq: Every day | ORAL | 3 refills | Status: DC
  Filled 2022-08-01: qty 150, 30d supply, fill #0

## 2022-08-01 MED ORDER — FINASTERIDE 5 MG PO TABS: 5.0000 mg | ORAL_TABLET | Freq: Every day | ORAL | Status: AC

## 2022-08-01 MED ORDER — BOOST HIGH PROTEIN PO LIQD
1.0000 | Freq: Three times a day (TID) | ORAL | 0 refills | Status: AC
  Filled 2022-08-01: qty 90, 1d supply, fill #0

## 2022-08-01 MED ORDER — OXYCODONE-ACETAMINOPHEN 5-325 MG PO TABS
1.0000 | ORAL_TABLET | ORAL | 0 refills | Status: DC | PRN
  Filled 2022-08-01: qty 12, 2d supply, fill #0

## 2022-08-01 MED ORDER — PREDNISONE 10 MG PO TABS
ORAL_TABLET | ORAL | 0 refills | Status: AC
  Filled 2022-08-01: qty 34, 43d supply, fill #0

## 2022-08-01 MED ORDER — BISACODYL 10 MG RE SUPP: 10.0000 mg | Freq: Every day | RECTAL | 0 refills | Status: AC | PRN

## 2022-08-01 MED ORDER — ACETAMINOPHEN 325 MG PO TABS: 650.0000 mg | ORAL_TABLET | Freq: Four times a day (QID) | ORAL | Status: AC | PRN

## 2022-08-01 MED ORDER — LOSARTAN POTASSIUM 50 MG PO TABS: 50.0000 mg | ORAL_TABLET | Freq: Every day | ORAL | Status: DC

## 2022-08-01 MED ORDER — TAMSULOSIN HCL 0.4 MG PO CAPS: 0.4000 mg | ORAL_CAPSULE | Freq: Every day | ORAL | Status: AC

## 2022-08-01 MED ORDER — GLUCOSE 4 G PO CHEW
CHEWABLE_TABLET | ORAL | Status: AC
  Filled 2022-08-01: qty 1

## 2022-08-01 NOTE — Discharge Summary (Signed)
Physician Discharge Summary  Wayne Stokes CWU:[id number] DOB: December 30, 1965 DOA: 05/26/2022  PCP: [name], NP  Admit date: 05/26/2022 Discharge date: 08/01/2022 Discharging to: SNF Recommendations for Outpatient Follow-up:  He will need social work f/u at [facility] for disability application  He will need f/u with rheumatology in 1 wk  He will need f/u with Urology in 1 wk Continue Foley until evaluated by Urology  Consults:  ID Radiology GI Palliative care  Procedures:  Kyphoplasty   Discharge Diagnoses:   Principal Problem:   T12 compression fracture (HCC) Active Problems:   Probable Rheumatoid arthritis (HCC)   Spinal stenosis of lumbar region with neurogenic claudication   Hyponatremia   Acute urinary retention   Type 2 diabetes mellitus without complication, without long-term current use of insulin (HCC)   Hypertension associated with diabetes (HCC)   Iron deficiency anemia   Hyperlipidemia associated with type 2 diabetes mellitus (HCC)   Mild cognitive impairment   Thrush, oral   Protein-calorie malnutrition, severe     Hospital Course:  Is a 56 year old male from [place] who does not speak English and has diabetes mellitus, hypertension and anemia.  The patient presented to the hospital for uncontrolled lower back pain and difficulty ambulating. In the ED, he was noted to have a T12 compression fracture with 30% loss in height which appeared acute.  He was also found to have degenerative disc disease and facet arthrosis at L4-L5, severe spinal stenosis and severe bilateral L4 foraminal narrowing. On 06/18/2022 the patient underwent a kyphoplasty of T12.   During his hospital stay, he also admitted to chronic right shoulder pain, right wrist pain and was found to have severely poor oral intake.   11/10-core track feeding tube was placed due to continued poor oral intake and dysphagia    Principal Problem:   T12 compression fracture  Lumbar spinal stenosis -Status  post kyphoplasty - He will transition to skilled nursing facility upon discharge   Active Problems: Dysphagia and poor oral intake-severe protein calorie malnutrition - Patient has severe muscle and fat depletion and had poor oral intake prior to the hospital stay with 20 % loss of total body weight in the past couple of months- he is no longer able to prepare his own food at home - A cortrak was placed and patient received nutrition via tube feeds temporarily - 11/23- has excellent PO intake- dc cortrak  - continues to eat reasonable well- will need to continue nutritional supplements that are high in protein   Urinary retention - The patient has failed a voiding trial 3 times and will need to discharge with a Foley catheter and follow-up with urology as outpatient - Continue Flomax and Proscar   Chronic right shoulder pain and right wrist pain- ? Rheumatoid arthritis - MRI imaging on 06/04/2022 revealed a large complex joint effusion with thick synovial enhancement suggesting septic arthritis, septic subacromial and subdeltoid bursitis and probable septic tendinopathy involving the rotator cuff tendons and the long head of the biceps tendon and surrounding myofasciitis -MRI imaging of the right wrist on 07/09/2022 revealed a joint effusion, synovitis, suspected erosive changes and marrow edema suggestive of inflammatory versus septic arthritis - Further work-up done including aspiration of the right wrist on 11/16-no signs of septic arthritis - CCP antibody noted to be elevated, rheumatoid factor also elevated - Double-stranded DNA, scleroderma antibody and anti-Jo-are negative - The patient is currently receiving prednisone with a plan to taper - He will need to follow-up with rheumatology  as outpatient- at this point, he is not able to work or take care of himself at home      Type 2 diabetes mellitus without complication, without long-term current use of insulin (HCC) - Continue NovoLog  while in the hospital - Most recent A1c on 06/28/2022 was 6.2   Essential hypertension - Continue Toprol and Losaran   Mild cognitive impairment - Continue Aricept   Social concerns: I have had an extensive conversation with the patient via an interpreter.  Due to the patient's back and shoulder pain, he is no longer able to work and no longer able to cook for himself.  He states that he lives with a roommate who may not be able to take him back if he continues to be unable to work.  He will ultimately need social work assistance as he will likely be homeless once he is discharged from the skilled nursing facility.  I have discussed this with our Child psychotherapist.   Discharge Instructions  Discharge Instructions     Increase activity slowly   Complete by: As directed    No wound care   Complete by: As directed       Allergies as of 08/01/2022       Reactions   Beef-derived Products Swelling   Shellfish Allergy Rash   Eggs Or Egg-derived Products Swelling   Other Other (See Comments)   Egg plant , pizza - swelling        Medication List     STOP taking these medications    amLODipine 5 MG tablet Commonly known as: NORVASC   clotrimazole-betamethasone cream Commonly known as: LOTRISONE   glipiZIDE 5 MG tablet Commonly known as: GLUCOTROL   losartan-hydrochlorothiazide 50-12.5 MG tablet Commonly known as: HYZAAR       TAKE these medications    acetaminophen 325 MG tablet Commonly known as: TYLENOL Take 2 tablets (650 mg total) by mouth every 6 (six) hours as needed for mild pain, fever or headache.   atorvastatin 40 MG tablet Commonly known as: LIPITOR Take 1 tablet (40 mg total) by mouth daily.   bisacodyl 10 MG suppository Commonly known as: DULCOLAX Place 1 suppository (10 mg total) rectally daily as needed for moderate constipation.   diclofenac 75 MG EC tablet Commonly known as: VOLTAREN Take 1 tablet (75 mg total) by mouth 2 (two) times daily.    donepezil 5 MG tablet Commonly known as: ARICEPT Take 1 tablet (5 mg total) by mouth at bedtime.   feeding supplement Liqd Take 237 mLs by mouth 3 (three) times daily between meals.   ferrous sulfate 300 (60 Fe) MG/5ML syrup Take 5 mLs (300 mg total) by mouth daily with breakfast.   finasteride 5 MG tablet Commonly known as: PROSCAR Take 1 tablet (5 mg total) by mouth daily.   gabapentin 300 MG capsule Commonly known as: NEURONTIN Take 1 capsule (300 mg total) by mouth 3 (three) times daily.   losartan 50 MG tablet Commonly known as: COZAAR Take 1 tablet (50 mg total) by mouth daily. Start taking on: August 02, 2022   metFORMIN 500 MG tablet Commonly known as: GLUCOPHAGE Take 1 tablet (500 mg total) by mouth 2 (two) times daily with a meal.   metoprolol succinate 25 MG 24 hr tablet Commonly known as: TOPROL-XL Take 0.5 tablets (12.5 mg total) by mouth daily. What changed: how much to take   Omega 3 1000 MG Caps Take 1,000 mg by mouth daily.   oxyCODONE-acetaminophen  5-325 MG tablet Commonly known as: Percocet Take 1 tablet by mouth every 4 (four) hours as needed for severe pain.   pantoprazole 40 MG tablet Commonly known as: PROTONIX Take 1 tablet (40 mg total) by mouth daily.   predniSONE 10 MG tablet Commonly known as: DELTASONE Take 2 tablets (20 mg total) by mouth daily for 3 days, THEN 1.5 tablets (15 mg total) daily for 5 days, THEN 1 tablet (10 mg total) daily for 5 days, THEN 0.5 tablets (5 mg total) daily. Start taking on: August 01, 2022   tamsulosin 0.4 MG Caps capsule Commonly known as: FLOMAX Take 1 capsule (0.4 mg total) by mouth daily after supper.        Follow-up Information     [facility] Follow up.   Specialty: Home Health Services Why: For home health services Contact information: [address] [place] [address] [phone]         Eldred Manges, MD Follow up in 2 week(s).   Specialty: Orthopedic  Surgery Contact information: [address] [place] [address] [phone]                    The results of significant diagnostics from this hospitalization (including imaging, microbiology, ancillary and laboratory) are listed below for reference.    DG FLUORO GUIDED NEEDLE PLC ASPIRATION/INJECTION LOC  Result Date: 07/24/2022 CLINICAL DATA:  Right wrist pain and swelling. Evidence of an inflammatory or septic arthritis on MRI. EXAM: RIGHT WRIST ARTHROCENTESIS TECHNIQUE: Informed consent was obtained with the assistance of an interpreter and a time out performed. An appropriate skin entrance site was determined. The site was marked, prepped with Betadine, draped in the usual sterile fashion, and infiltrated locally with 1% Lidocaine. Multiple attempts were made using a 21-gauge needle to gain intra-articular access to the radiocarpal joint under intermittent fluoroscopy, however no fluid could be aspirated. Injection of a small amount of Omnipaque 300 confirmed intra-articular placement, and an image was saved as documentation. As joint fluid could still not be readily aspirated, 1 mL of normal saline was then injected into the joint space and subsequent aspiration yielded 1 mL of pink-tinged fluid. The needle was removed, and the site was bandaged. There was no immediate complication. This exam was performed by Sheliah Plane, PA-C, and was supervised and interpreted by Sebastian Ache, MD. FLUOROSCOPY: Radiation Exposure Index (as provided by the fluoroscopic device): 0.30 mGy Kerma FINDINGS: 1 mL of joint washings obtained for laboratory testing IMPRESSION: Technically successful but challenging right wrist arthrocentesis. Electronically Signed   By: Sebastian Ache M.D.   On: 07/24/2022 15:23   DG ESOPHAGUS W SINGLE CM (SOL OR THIN BA)  Result Date: 07/21/2022 CLINICAL DATA:  Poor following function and stasis of barium seen throughout the thoracic esophagus on modified barium  swallow. Request for esophagram. The study is limited secondary to patient's immobility. EXAM: ESOPHAGUS/BARIUM SWALLOW/TABLET STUDY TECHNIQUE: Single contrast examination was performed using thin liquid barium. This exam was performed by Corrin Parker, PA-C, and was supervised and interpreted by Caprice Renshaw, MD. FLUOROSCOPY: Radiation Exposure Index (as provided by the fluoroscopic device): 8.10 mGy Kerma COMPARISON:  Modified barium swallow done July 20, 2022 FINDINGS: Limited single-contrast esophagram with nasoenteric feeding tube in place. Mild to moderate esophageal dysmotility. No gastroesophageal reflux occurred during the exam. No evidence of hiatal hernia. A 13 mm barium tablet was not given. Prior lumbar kyphoplasty noted. IMPRESSION: Limited single contrast esophagram. Mild-to-moderate esophageal dysmotility. No hiatal  hernia or gastroesophageal reflux. Electronically Signed   By: Caprice Renshaw M.D.   On: 07/21/2022 13:42   DG Swallowing Func-Speech Pathology  Result Date: 07/20/2022 Table formatting from the original result was not included. Images from the original result were not included. Objective Swallowing Evaluation: Type of Study: MBS-Modified Barium Swallow Study  Patient Details Name: Tyrese Capriotti MRN: [id number] Date of Birth: Nov 25, 1965 Today's Date: 07/20/2022 Time: SLP Start Time (ACUTE ONLY): 1250 -SLP Stop Time (ACUTE ONLY): 1311 SLP Time Calculation (min) (ACUTE ONLY): 21 min Past Medical History: Past Medical History: Diagnosis Date  Diabetes mellitus without complication (HCC)   Hypertension  Past Surgical History: Past Surgical History: Procedure Laterality Date  IR KYPHO LUMBAR INC FX REDUCE BONE BX UNI/BIL CANNULATION INC/IMAGING  06/18/2022  IR LUMBAR DISC ASPIRATION W/IMG GUIDE  06/02/2022 HPI: Brockton Nuno is an 56 y.o. male from [place] who speaks [place]anisl[place] Spire and was admitted for uncontrolled back pain and severe spinal lumbar stenosis orthopedic, neurosurgery evaluated the patient  and recommended TLC L for pain control and outpatient follow-up.  Physical therapy recommended inpatient rehab difficult to place due to lack of insurance.  IR was consulted to perform kyphoplasty of T12 compression fracture on 06/18/2022. CT neck showed Moderate degenerative disc disease is noted at C4-5 and  C5-6.  Pt reprots difficulty swallowing, has severe weight loss. PMH: diabetes mellitus type 2, essential hypertension with lumbar disc disease with spinal stenosis, T12 compression fracture and mild cognitive impairment  No data recorded  Recommendations for follow up therapy are one component of a multi-disciplinary discharge planning process, led by the attending physician.  Recommendations may be updated based on patient status, additional functional criteria and insurance authorization. Assessment / Plan / Recommendation   07/20/2022   2:33 PM Clinical Impressions Clinical Impression SLP contacted [name] to coordinate the timing of the study with him, but no response was received and no response was received to SLP's voice mail. The study was therefore completed without the use of the interpreter and changes to the pt's diet will be deferred until explanation can be provided with use of interpretation. Pt presents with oropharyngeal dysphagia characterized by weak bolus manipulation, impaired posterior propulsion, reduced bolus cohesion, a pharyngeal delay, and reduced anterior laryngeal movement. He demonstrated difficulty sucking from a straw, premature spillage to the valleculae, vallecular residue, pyriform sinus residue, and the swallow was often triggered with most of the liquid boluses at the level of the pyriform sinuses. Penetration (PAS 3) was noted with thin liquids after the swallow due to phrayngeal residue, and spillover of residue in the pyriform sinuses. Penetration was eliminated with individual boluses of thin liquids via cup and with a chin tuck posture which was  facilitated with use of a straw. Visualization of pt's esophagus revealed stasis of barium throughout the thoracic esophagus (see image in progress note) which did not clear with time liquid boluses. The etiology of this cannot be determined with this study; SLP therefore recommends esophageal assessment and/or GI consult. Pt's current diet will be continued until there can be further communication with the pt, but SLP anticipates that advancement to thin liquids is likely if precautions can be observed, and that, pending assessment/management of pt's esophageal dysfunction, solid advancement may be possible. SLP Visit Diagnosis Dysphagia, oropharyngeal phase (R13.12) Impact on safety and function Risk for inadequate nutrition/hydration     07/20/2022   2:33 PM Treatment Recommendations Treatment Recommendations Therapy as outlined in treatment plan below  No data to display      07/20/2022   2:33 PM Diet Recommendations SLP Diet Recommendations Dysphagia 1 (Puree) solids;Nectar thick liquid Liquid Administration via Cup;Straw Medication Administration Crushed with puree Compensations Slow rate;Small sips/bites Postural Changes Seated upright at 90 degrees     07/20/2022   2:33 PM Other Recommendations Oral Care Recommendations Oral care BID Follow Up Recommendations Skilled nursing-short term rehab (<3 hours/day) Assistance recommended at discharge Intermittent Supervision/Assistance Functional Status Assessment Patient has had a recent decline in their functional status and demonstrates the ability to make significant improvements in function in a reasonable and predictable amount of time.   07/20/2022   2:33 PM Frequency and Duration  Speech Therapy Frequency (ACUTE ONLY) min 2x/week Treatment Duration 2 weeks     07/20/2022   2:33 PM Oral Phase Oral Phase Impaired Oral - Nectar Cup Reduced posterior propulsion;Weak lingual manipulation;Decreased bolus cohesion;Premature spillage Oral - Nectar Straw Reduced  posterior propulsion;Weak lingual manipulation;Decreased bolus cohesion;Premature spillage Oral - Thin Cup Reduced posterior propulsion;Weak lingual manipulation;Decreased bolus cohesion;Premature spillage Oral - Thin Straw Reduced posterior propulsion;Weak lingual manipulation;Decreased bolus cohesion;Premature spillage Oral - Puree Reduced posterior propulsion;Weak lingual manipulation;Decreased bolus cohesion;Premature spillage Oral - Regular Reduced posterior propulsion;Decreased bolus cohesion;Premature spillage;Impaired mastication;Weak lingual manipulation    07/20/2022   2:33 PM Pharyngeal Phase Pharyngeal Phase Impaired Pharyngeal- Nectar Cup Pharyngeal residue - valleculae;Pharyngeal residue - pyriform;Reduced anterior laryngeal mobility;Delayed swallow initiation-pyriform sinuses;Delayed swallow initiation-vallecula Pharyngeal- Nectar Straw Pharyngeal residue - valleculae;Pharyngeal residue - pyriform;Reduced anterior laryngeal mobility;Delayed swallow initiation-pyriform sinuses;Delayed swallow initiation-vallecula Pharyngeal- Thin Cup Pharyngeal residue - valleculae;Pharyngeal residue - pyriform;Reduced anterior laryngeal mobility;Delayed swallow initiation-pyriform sinuses;Delayed swallow initiation-vallecula;Penetration/Apiration after swallow Pharyngeal Material enters airway, remains ABOVE vocal cords and not ejected out Pharyngeal- Thin Straw Pharyngeal residue - valleculae;Pharyngeal residue - pyriform;Reduced anterior laryngeal mobility;Delayed swallow initiation-pyriform sinuses;Delayed swallow initiation-vallecula;Penetration/Apiration after swallow Pharyngeal Material enters airway, remains ABOVE vocal cords and not ejected out Pharyngeal- Puree Pharyngeal residue - valleculae;Pharyngeal residue - pyriform;Reduced anterior laryngeal mobility;Delayed swallow initiation-pyriform sinuses;Delayed swallow initiation-vallecula Pharyngeal- Regular Pharyngeal residue - valleculae;Pharyngeal residue -  pyriform;Reduced anterior laryngeal mobility;Delayed swallow initiation-pyriform sinuses;Delayed swallow initiation-vallecula     No data to display    Shanika I. Vear Clock, MS, CCC-SLP Acute Rehabilitation Services Office number [phone] Scheryl Marten 07/20/2022, 3:05 PM                     DG Abd Portable 1V  Result Date: 07/18/2022 CLINICAL DATA:  Encounter for feeding tube placement. EXAM: PORTABLE ABDOMEN - 1 VIEW COMPARISON:  06/20/2022 FINDINGS: The endotracheal tube tip is well below the level of the GE junction within the right upper quadrant of the abdomen. The tip is in the expected location of the antropyloric junction. Bowel gas pattern appears nonobstructed. Signs of previous vertebroplasty identified at the T12 level. IMPRESSION: Enteric tube tip is in the expected location of the antropyloric junction. Electronically Signed   By: Signa Kell M.D.   On: 07/18/2022 15:33   MR WRIST RIGHT W WO CONTRAST  Result Date: 07/09/2022 CLINICAL DATA:  Wrist pain and swelling. EXAM: MR OF THE RIGHT WRIST WITHOUT AND WITH CONTRAST TECHNIQUE: Multiplanar multisequence MR imaging of the right wrist was performed both before and after the administration of intravenous contrast. CONTRAST:  45mL GADAVIST GADOBUTROL 1 MMOL/ML IV SOLN COMPARISON:  None Available. FINDINGS: Examination is limited by patient motion. MR findings suggest a significant inflammatory arthropathy involving the wrist. There is a joint effusion, synovitis and suspected erosive changes  and marrow edema and osseous enhancement after contrast. Certainly could not exclude the possibility of septic arthritis and recommend correlation with clinical findings. No discrete drainable abscess is identified. Mild associated myofasciitis but no evidence of pyomyositis. No findings suspicious for septic tenosynovitis. IMPRESSION: 1. Inflammatory arthropathy versus septic arthritis. Recommend correlation with clinical findings. 2. No discrete  drainable abscess is identified. Mild associated myofasciitis but no evidence of pyomyositis. No findings suspicious for septic tenosynovitis. Electronically Signed   By: Rudie Meyer M.D.   On: 07/09/2022 10:20   DG Shoulder Right Port  Result Date: 07/07/2022 CLINICAL DATA:  Right shoulder pain. EXAM: RIGHT SHOULDER - 1 VIEW COMPARISON:  Shoulder MRI 1 month ago, 06/04/2022, radiograph 06/01/2022. FINDINGS: Glenohumeral joint space narrowing with inferior spurring of the humeral head/neck. No definite bony destructive change or erosions. Chronic changes at the acromioclavicular joint. Inferior positioning of the humeral head may be seen in the setting of joint effusion. IMPRESSION: 1. Glenohumeral osteoarthritis. 2. Inferior positioning of the humeral head may be seen with joint effusion. Patient with recent MRI findings suspicious for septic arthritis. No radiographic findings of osteomyelitis. 3. Chronic degenerative change at the acromioclavicular joint. Electronically Signed   By: Narda Rutherford M.D.   On: 07/07/2022 16:15   DG Wrist Complete Right  Result Date: 07/07/2022 CLINICAL DATA:  Right wrist swelling. EXAM: RIGHT WRIST - COMPLETE 3+ VIEW COMPARISON:  None Available. FINDINGS: No acute fracture or dislocation. Cystic changes and possible erosion involving the ulna styloid. No additional erosive changes. Occasional cystic changes in the carpal bones, typically degenerative. Minimal radiocarpal spurring. There is mild generalized soft tissue edema. Vascular calcifications, no other soft tissue calcifications are seen. IMPRESSION: 1. Cystic changes and possible erosion involving the ulna styloid, suspicious for inflammatory arthropathy. Recommend clinical correlation for infection. 2. Mild generalized soft tissue edema. 3. Occasional cystic changes in the carpal bones, typically degenerative. Electronically Signed   By: Narda Rutherford M.D.   On: 07/07/2022 16:13   Labs:   Basic Metabolic  Panel: Recent Labs  Lab 07/26/22 0230 07/27/22 0235 07/28/22 0721 07/29/22 0813 07/30/22 0438  NA 131* 133* 138 137 138  K 5.3* 4.6 4.0 3.6 3.9  CL 99 101 106 103 104  CO2 26 24 26 28 30   GLUCOSE 270* 235* 95 113* 111*  BUN 20 24* 23* 18 17  CREATININE 1.08 0.92 0.78 0.73 0.62  CALCIUM 9.6 8.8* 8.7* 8.3* 8.3*  MG 1.8  --   --   --   --   PHOS 3.3  --   --   --   --      CBC: Recent Labs  Lab 07/27/22 0235 07/28/22 0721 07/29/22 0813 07/30/22 0438  WBC 12.1* 10.0 7.8 10.5  HGB 7.6* 7.1* 7.2* 8.4*  HCT 23.8* 21.8* 21.9* 27.0*  MCV 71.5* 69.6* 70.2* 71.6*  PLT 437* 494* 487* 606*         SIGNED:   Calvert Cantor, MD  [facility] 08/01/2022, 10:49 AM

## 2022-08-01 NOTE — Progress Notes (Signed)
Occupational Therapy Treatment Patient Details Name: Wayne Stokes MRN: [id number] DOB: 1965-09-23 Today's Date: 08/01/2022   History of present illness 55 year old male (speaking language [name]) admitted with uncontrolled lower back pain and ambulatory difficulty due to severe lumbar spinal stenosis and T12 compression fracture.  Neurosurgery and orthopedics were consulted recommended TLSO brace and pain control initially.  IR consulted for kyphoplasty, completed on 06/18/2022.  There was some concern for right shoulder septic arthritis. ID consulted, and orthopedic. IR aspirated joint with no finding suggestive of infection. Antibiotics were discontinued. Due to weight loss and poor oral intake he was seen by dietitian on 11/10 and started on cortrak tube feeds.  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   OT comments  Patient received in supine and able to get to EOB from sidelying without assistance. Patient was min guard to walk to bathroom and performed grooming and bathing tasks standing at sink. Patient donned T-shirt seated and required assistance to thread legs into pants and was able to pull up without assistance. Patient performed mobility in hallway before returning to bed. Patient is making good progress with decreased complaints of pain. Acute OT to continue to follow.    Recommendations for follow up therapy are one component of a multi-disciplinary discharge planning process, led by the attending physician.  Recommendations may be updated based on patient status, additional functional criteria and insurance authorization.    Follow Up Recommendations  Skilled nursing-short term rehab (<3 hours/day)     Assistance Recommended at Discharge Frequent or constant Supervision/Assistance  Patient can return home with the following  A little help with walking and/or  transfers;Assistance with cooking/housework;Assist for transportation;Help with stairs or ramp for entrance;A lot of help with bathing/dressing/bathroom;Direct supervision/assist for medications management   Equipment Recommendations  BSC/3in1    Recommendations for Other Services      Precautions / Restrictions Precautions Precautions: Fall;Back Precaution Booklet Issued: No Precaution Comments: Back precautions for comfort Required Braces or Orthoses: Spinal Brace Spinal Brace: Lumbar corset;Applied in sitting position Restrictions Weight Bearing Restrictions: No       Mobility Bed Mobility Overal bed mobility: Needs Assistance Bed Mobility: Rolling, Sidelying to Sit, Sit to Sidelying Rolling: Modified independent (Device/Increase time) Sidelying to sit: Supervision     Sit to sidelying: Modified independent (Device/Increase time) General bed mobility comments: no physical assist needed    Transfers Overall transfer level: Needs assistance Equipment used: Rolling walker (2 wheels) Transfers: Sit to/from Stand Sit to Stand: Min guard           General transfer comment: min guard to stand and with mobility     Balance Overall balance assessment: Needs assistance Sitting-balance support: Feet supported, No upper extremity supported Sitting balance-Leahy Scale: Fair     Standing balance support: Bilateral upper extremity supported, During functional activity, Single extremity supported Standing balance-Leahy Scale: Poor Standing balance comment: able to stand at sink for self care tasks with one extremity assist for balance                           ADL either performed or assessed with clinical judgement   ADL Overall ADL's : Needs assistance/impaired     Grooming: Wash/dry hands;Wash/dry face;Oral care;Applying deodorant;Min guard;Standing Grooming Details (indicate cue type and reason): standing at sink Upper Body Bathing: Min  guard;Standing Upper Body Bathing Details (indicate cue type and  reason): standing at sink Lower Body Bathing: Minimal assistance;Sit to/from stand;Sitting/lateral leans Lower Body Bathing Details (indicate cue type and reason): performed peri care cleaning while standing Upper Body Dressing : Set up;Sitting Upper Body Dressing Details (indicate cue type and reason): donned T-shirt Lower Body Dressing: Minimal assistance;Sitting/lateral leans;Sit to/from stand Lower Body Dressing Details (indicate cue type and reason): assistance to thread legs into clothing               General ADL Comments: decreased pain allowing patient to perform self care tasks standing at sink    Extremity/Trunk Assessment Upper Extremity Assessment RUE Deficits / Details: right shoulder pain with limited A/ROM shoulder flexion to ~90 degrees. Elbow and wrist A/ROM [facility] in all ranges. Impaired gross grasp with inability to make a tight fist. RUE Coordination: decreased fine motor;decreased gross motor LUE Deficits / Details: left shoulder pain with limited A/ROM shoulder flexion to ~90 degrees. Elbow and wrist A/ROM [facility] in all ranges. Impaired gross grasp with inability to make a tight fist. LUE Coordination: decreased fine motor;decreased gross motor            Vision       Perception     Praxis      Cognition Arousal/Alertness: Awake/alert Behavior During Therapy: Flat affect Overall Cognitive Status: Within Functional Limits for tasks assessed Area of Impairment: Safety/judgement, Following commands, Problem solving, Memory                     Memory: Decreased recall of precautions Following Commands: Follows one step commands consistently, Follows multi-step commands inconsistently Safety/Judgement: Decreased awareness of safety Awareness: Emergent Problem Solving: Requires verbal cues General Comments: able to follow commands with decreased complaints of pain        Exercises       Shoulder Instructions       General Comments      Pertinent Vitals/ Pain       Pain Assessment Pain Assessment: Faces Faces Pain Scale: Hurts little more Pain Location: low back Pain Descriptors / Indicators: Grimacing, Discomfort, Sore, Guarding Pain Intervention(s): Limited activity within patient's tolerance, Monitored during session, Repositioned  Home Living                                          Prior Functioning/Environment              Frequency  Min 2X/week        Progress Toward Goals  OT Goals(current goals can now be found in the care plan section)  Progress towards OT goals: Progressing toward goals  Acute Rehab OT Goals Patient Stated Goal: get better OT Goal Formulation: With patient Time For Goal Achievement: 08/07/22 Potential to Achieve Goals: Good ADL Goals Pt Will Perform Grooming: with supervision;standing Pt Will Perform Lower Body Dressing: with supervision;sit to/from stand Pt Will Transfer to Toilet: with supervision;ambulating;regular height toilet Pt Will Perform Toileting - Clothing Manipulation and hygiene: with supervision;sit to/from stand Pt/caregiver will Perform Home Exercise Program: Increased strength;Both right and left upper extremity;With theraputty Additional ADL Goal #1: Pt will perform bed mobility independently in preparation for ADLs. Additional ADL Goal #2: pt will participate in 30 minutes of activity with intermittent rest breaks. Additional ADL Goal #3: Pt will follow 2 step commands with 50% accuracy with interpreter.  Plan Discharge plan remains appropriate;Frequency remains appropriate    Co-evaluation  AM-PAC OT "6 Clicks" Daily Activity     Outcome Measure   Help from another person eating meals?: A Little Help from another person taking care of personal grooming?: A Little Help from another person toileting, which includes using toliet, bedpan, or urinal?:  A Little Help from another person bathing (including washing, rinsing, drying)?: A Little Help from another person to put on and taking off regular upper body clothing?: A Little Help from another person to put on and taking off regular lower body clothing?: A Lot 6 Click Score: 17    End of Session Equipment Utilized During Treatment: Gait belt;Rolling walker (2 wheels);Back brace  OT Visit Diagnosis: Other abnormalities of gait and mobility (R26.89);Muscle weakness (generalized) (M62.81);Pain Pain - Right/Left: Left (both) Pain - part of body: Shoulder   Activity Tolerance Patient tolerated treatment well   Patient Left in bed;with call bell/phone within reach;with bed alarm set   Nurse Communication Mobility status        Time: 7948-0165 OT Time Calculation (min): 31 min  Charges: OT General Charges $OT Visit: 1 Visit OT Treatments $Self Care/Home Management : 23-37 mins  [name], OTA Acute Rehabilitation Services  Office [phone]   [name] 08/01/2022, 1:09 PM

## 2022-08-01 NOTE — Progress Notes (Signed)
Calorie Count Note  48 hour calorie count ordered.  Diet: dysphagia 2 Supplements: Mighty Shake TID with meals, each supplement provides 330 kcals and 9 grams of protein   Only 1 meal ticket received from 48 hour calorie count. Pt consumed 400 kcal and 14g protein from his dinner meal on 11/22.  Calorie count inconclusive d/t limited documentation of meal completions.   Nutrition Dx:  Severe Malnutrition related to acute illness as evidenced by energy intake < or equal to 50% for > or equal to 5 days, severe fat depletion, severe muscle depletion, percent weight loss (20% wt loss in 2 months).   Goal: Patient will meet greater than or equal to 90% of their needs   Intervention:  Continue to encourage adequate PO intake Mighty Shake TID with meals, each supplement provides 330 kcals and 9 grams of protein  Drusilla Kanner, RDN, LDN Clinical Nutrition

## 2022-08-01 NOTE — Progress Notes (Signed)
Report given to [place].

## 2022-08-01 NOTE — TOC Transition Note (Signed)
Transition of Care [facility]) - CM/SW Discharge Note   Patient Details  Name: Wayne Stokes MRN: [id number] Date of Birth: [birth year]  Transition of Care [facility]) CM/SW Contact:  Deatra Robinson, LCSW Phone Number: 08/01/2022, 11:59 AM   Clinical Narrative:  Pt for dc to [facility] today. Spoke to [place] in admissions who confirmed they are prepared to admit pt to room 414B. Pt aware of dc and reports agreeable. [facility] arranged for transport and RN provided with number for report. SW signing off at dc.   Dellie Burns, MSW, LCSW [phone] (coverage)       Final next level of care: Skilled Nursing Facility Barriers to Discharge: No Barriers Identified   Patient Goals and CMS Choice   CMS Medicare.gov Compare Post Acute Care list provided to:: Patient Choice offered to / list presented to : Patient  Discharge Placement              Patient chooses bed at: [facility] Patient to be transferred to facility by: [facility]   Patient and family notified of of transfer: 08/01/22  Discharge Plan and Services In-house Referral: Artist Discharge Planning Services: CM Consult                                 Social Determinants of Health (SDOH) Interventions Food Insecurity Interventions: Inpatient TOC Transportation Interventions: Inpatient TOC   Readmission Risk Interventions     No data to display

## 2022-08-01 NOTE — Plan of Care (Signed)
  Problem: Education: Goal: Knowledge of General Education information will improve Description: Including pain rating scale, medication(s)/side effects and non-pharmacologic comfort measures Outcome: Progressing   Problem: Health Behavior/Discharge Planning: Goal: Ability to manage health-related needs will improve Outcome: Progressing   Problem: Clinical Measurements: Goal: Ability to maintain clinical measurements within normal limits will improve Outcome: Progressing Goal: Diagnostic test results will improve Outcome: Progressing   Problem: Activity: Goal: Risk for activity intolerance will decrease Outcome: Progressing   Problem: Coping: Goal: Level of anxiety will decrease Outcome: Progressing   Problem: Elimination: Goal: Will not experience complications related to bowel motility Outcome: Progressing Goal: Will not experience complications related to urinary retention Outcome: Progressing   Problem: Pain Managment: Goal: General experience of comfort will improve Outcome: Progressing   Problem: Safety: Goal: Ability to remain free from injury will improve Outcome: Progressing   Problem: Skin Integrity: Goal: Risk for impaired skin integrity will decrease Outcome: Progressing   Problem: Education: Goal: Ability to describe self-care measures that may prevent or decrease complications (Diabetes Survival Skills Education) will improve Outcome: Progressing Goal: Individualized Educational Video(s) Outcome: Progressing   Problem: Coping: Goal: Ability to adjust to condition or change in health will improve Outcome: Progressing   Problem: Fluid Volume: Goal: Ability to maintain a balanced intake and output will improve Outcome: Progressing   Problem: Health Behavior/Discharge Planning: Goal: Ability to identify and utilize available resources and services will improve Outcome: Progressing Goal: Ability to manage health-related needs will improve Outcome:  Progressing   Problem: Metabolic: Goal: Ability to maintain appropriate glucose levels will improve Outcome: Progressing   Problem: Nutritional: Goal: Maintenance of adequate nutrition will improve Outcome: Progressing Goal: Progress toward achieving an optimal weight will improve Outcome: Progressing   Problem: Skin Integrity: Goal: Risk for impaired skin integrity will decrease Outcome: Progressing   Problem: Tissue Perfusion: Goal: Adequacy of tissue perfusion will improve Outcome: Progressing   

## 2022-08-03 LAB — CRYOGLOBULIN

## 2022-08-04 ENCOUNTER — Telehealth: Payer: Self-pay

## 2022-08-04 NOTE — Telephone Encounter (Signed)
Transition Care Management Unsuccessful Follow-up Telephone Call  Date of discharge and from where:  08/01/22  Attempts:  1st Attempt  Reason for unsuccessful TCM follow-up call:  Unable to reach patient  Renelda Loma RMA

## 2022-08-08 ENCOUNTER — Other Ambulatory Visit: Payer: Self-pay

## 2022-08-12 ENCOUNTER — Other Ambulatory Visit: Payer: Self-pay

## 2022-08-12 ENCOUNTER — Emergency Department (HOSPITAL_COMMUNITY): Payer: Medicaid Other

## 2022-08-12 ENCOUNTER — Emergency Department (HOSPITAL_COMMUNITY)
Admission: EM | Admit: 2022-08-12 | Discharge: 2022-08-12 | Disposition: A | Discharge status: Home / Self Care | Payer: Medicaid Other | Attending: Emergency Medicine | Admitting: Emergency Medicine

## 2022-08-12 DIAGNOSIS — Z7984 Long term (current) use of oral hypoglycemic drugs: Secondary | ICD-10-CM | POA: Insufficient documentation

## 2022-08-12 DIAGNOSIS — G8929 Other chronic pain: Secondary | ICD-10-CM

## 2022-08-12 DIAGNOSIS — M549 Dorsalgia, unspecified: Secondary | ICD-10-CM | POA: Diagnosis not present

## 2022-08-12 DIAGNOSIS — E119 Type 2 diabetes mellitus without complications: Secondary | ICD-10-CM | POA: Diagnosis not present

## 2022-08-12 DIAGNOSIS — R079 Chest pain, unspecified: Secondary | ICD-10-CM

## 2022-08-12 LAB — CBC WITH DIFFERENTIAL/PLATELET
Abs Immature Granulocytes: 0.05 10*3/uL (ref 0.00–0.07)
Basophils Absolute: 0 10*3/uL (ref 0.0–0.1)
Basophils Relative: 0 %
Eosinophils Absolute: 0 10*3/uL (ref 0.0–0.5)
Eosinophils Relative: 0 %
HCT: 29.3 % — ABNORMAL LOW (ref 39.0–52.0)
Hemoglobin: 9.1 g/dL — ABNORMAL LOW (ref 13.0–17.0)
Immature Granulocytes: 1 %
Lymphocytes Relative: 16 %
Lymphs Abs: 1.6 10*3/uL (ref 0.7–4.0)
MCH: 23.3 pg — ABNORMAL LOW (ref 26.0–34.0)
MCHC: 31.1 g/dL (ref 30.0–36.0)
MCV: 75.1 fL — ABNORMAL LOW (ref 80.0–100.0)
Monocytes Absolute: 0.9 10*3/uL (ref 0.1–1.0)
Monocytes Relative: 9 %
Neutro Abs: 7.3 10*3/uL (ref 1.7–7.7)
Neutrophils Relative %: 74 %
Platelets: 475 10*3/uL — ABNORMAL HIGH (ref 150–400)
RBC: 3.9 MIL/uL — ABNORMAL LOW (ref 4.22–5.81)
RDW: 21.3 % — ABNORMAL HIGH (ref 11.5–15.5)
WBC: 9.9 10*3/uL (ref 4.0–10.5)
nRBC: 0 % (ref 0.0–0.2)

## 2022-08-12 LAB — COMPREHENSIVE METABOLIC PANEL
ALT: 8 U/L (ref 0–44)
AST: 21 U/L (ref 15–41)
Albumin: 2.1 g/dL — ABNORMAL LOW (ref 3.5–5.0)
Alkaline Phosphatase: 71 U/L (ref 38–126)
Anion gap: 12 (ref 5–15)
BUN: 22 mg/dL — ABNORMAL HIGH (ref 6–20)
CO2: 20 mmol/L — ABNORMAL LOW (ref 22–32)
Calcium: 8.8 mg/dL — ABNORMAL LOW (ref 8.9–10.3)
Chloride: 104 mmol/L (ref 98–111)
Creatinine, Ser: 1.12 mg/dL (ref 0.61–1.24)
GFR, Estimated: >60 mL/min (ref >60)
Glucose, Bld: 171 mg/dL — ABNORMAL HIGH (ref 70–99)
Potassium: 3.9 mmol/L (ref 3.5–5.1)
Sodium: 136 mmol/L (ref 135–145)
Total Bilirubin: 0.4 mg/dL (ref 0.3–1.2)
Total Protein: 6.2 g/dL — ABNORMAL LOW (ref 6.5–8.1)

## 2022-08-12 LAB — LIPASE, BLOOD: Lipase: 31 U/L (ref 11–51)

## 2022-08-12 LAB — TROPONIN I (HIGH SENSITIVITY)
Troponin I (High Sensitivity): 5 ng/L (ref <18)
Troponin I (High Sensitivity): 7 ng/L (ref <18)

## 2022-08-12 MED ORDER — LIDOCAINE 5 % EX PTCH
1.0000 | MEDICATED_PATCH | CUTANEOUS | Status: DC
  Administered 2022-08-12: 1 via TRANSDERMAL
  Filled 2022-08-12: qty 1

## 2022-08-12 NOTE — ED Notes (Signed)
RN attempted to call report to [place] x3

## 2022-08-12 NOTE — ED Provider Triage Note (Signed)
Emergency Medicine Provider Triage Evaluation Note  Wayne Stokes , a 56 y.o. male  was evaluated in triage.  Pt from [facility].  History difficult as patient speaks [place], no interpreter available at this hour.  Reports chest pain and motions towards his abdomen.  No vomiting with EMS or at facility.  Extensive hospitalization from 05/26/22 - 08/01/22 due to compression fractures.  Underwent kyphoplasty and temporarily had feeding tube due to malnutrition. 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  Review of Systems  Positive: Chest pain Negative: fever  Physical Exam  BP 113/86 (BP Location: Right Arm)   Pulse 82   Temp 98.2 F (36.8 C)   Resp 16   SpO2 96%   Gen:   Awake, no distress   Resp:  Normal effort  MSK:   Moves extremities without difficulty  Other:    Medical Decision Making  Medically screening exam initiated at 4:51 AM.  Appropriate orders placed.  Sheri Otwell was informed that the remainder of the evaluation will be completed by another provider, this initial triage assessment does not replace that evaluation, and the importance of remaining in the ED until their evaluation is complete.  ? Chest/abdominal pain.  History difficulty due to language barrier and no available interpreter at this hour.  EKG, labs, CXR ordered.   Garlon Hatchet, PA-C 08/12/22 [phone]

## 2022-08-12 NOTE — ED Provider Notes (Signed)
[facility] EMERGENCY DEPARTMENT Provider Note   CSN: [id number] Arrival date & time: 08/12/22  0451     History  Chief Complaint  Patient presents with   Chest Pain    Wayne Stokes is a 56 y.o. male.  56 yo male brought in by EMS from [facility].  Interpreter is used to assist with history and physical exam today.  Patient reports that he had a fall and was in the hospital.  He continues to have back pain which is unchanged without report of recent falls or injuries.  He did have chest pain prior to arrival, this resolved and has not returned.  He denies nausea, vomiting, changes in bowel or bladder habits.  Pain in his back is worse with movement.  He is at a facility, he is unsure if he is given pain medications for his back.   Recent hospitalization from 05/26/22-08/01/22 due to compression fractures treated with kyphoplasty with temporary feeding tube due to malnutrition. With chronic indwelling foley due to urinary retention.        Home Medications Prior to Admission medications   Medication Sig Start Date End Date Taking? Authorizing Provider  acetaminophen (TYLENOL) 325 MG tablet Take 2 tablets (650 mg total) by mouth every 6 (six) hours as needed for mild pain, fever or headache. 08/01/22   Calvert Cantor, MD  atorvastatin (LIPITOR) 40 MG tablet Take 1 tablet (40 mg total) by mouth daily. 11/18/21   [name], [name], NP  bisacodyl (DULCOLAX) 10 MG suppository Place 1 suppository (10 mg total) rectally daily as needed for moderate constipation. 08/01/22   Calvert Cantor, MD  diclofenac (VOLTAREN) 75 MG EC tablet Take 1 tablet (75 mg total) by mouth 2 (two) times daily. 05/19/22   Elson Areas, PA-C  donepezil (ARICEPT) 5 MG tablet Take 1 tablet (5 mg total) by mouth at bedtime. 12/12/21   Windell Norfolk, MD  feeding supplement (BOOST HIGH PROTEIN) LIQD Take 237 mLs by mouth 3 (three) times daily between meals. 08/01/22   Calvert Cantor, MD  ferrous sulfate  300 (60 Fe) MG/5ML syrup Take 5 mLs (300 mg total) by mouth daily with breakfast. 08/01/22   Calvert Cantor, MD  finasteride (PROSCAR) 5 MG tablet Take 1 tablet (5 mg total) by mouth daily. 08/01/22   Calvert Cantor, MD  gabapentin (NEURONTIN) 300 MG capsule Take 1 capsule (300 mg total) by mouth 3 (three) times daily. 05/15/22 08/13/22  Ivonne Andrew, NP  losartan (COZAAR) 50 MG tablet Take 1 tablet (50 mg total) by mouth daily. 08/02/22   Calvert Cantor, MD  metFORMIN (GLUCOPHAGE) 500 MG tablet Take 1 tablet (500 mg total) by mouth 2 (two) times daily with a meal. 11/15/21   [name], [name], NP  metoprolol succinate (TOPROL-XL) 25 MG 24 hr tablet Take 0.5 tablets (12.5 mg total) by mouth daily. 08/01/22 10/30/22  Calvert Cantor, MD  Omega 3 1000 MG CAPS Take 1,000 mg by mouth daily.    [provider]  oxyCODONE-acetaminophen (PERCOCET) 5-325 MG tablet Take 1 tablet by mouth every 4 (four) hours as needed for severe pain. 08/01/22   Calvert Cantor, MD  pantoprazole (PROTONIX) 40 MG tablet Take 1 tablet (40 mg total) by mouth daily. 08/01/22   Calvert Cantor, MD  predniSONE (DELTASONE) 10 MG tablet Take 2 tablets (20 mg total) by mouth daily for 3 days, THEN 1.5 tablets (15 mg total) daily for 5 days, THEN 1 tablet (10 mg total) daily for  5 days, THEN 0.5 tablets (5 mg total) daily. 08/01/22 09/13/22  Calvert Cantor, MD  tamsulosin (FLOMAX) 0.4 MG CAPS capsule Take 1 capsule (0.4 mg total) by mouth daily after supper. 08/01/22   Calvert Cantor, MD      Allergies    Beef-derived products, Shellfish allergy, Eggs or egg-derived products, and Other    Review of Systems   Review of Systems Negative except as per HPI Physical Exam Updated Vital Signs BP 119/81   Pulse 66   Temp 97.7 F (36.5 C) (Oral)   Resp 13   SpO2 99%  Physical Exam Vitals and nursing note reviewed.  Constitutional:      General: He is not in acute distress.    Appearance: He is well-developed. He is not diaphoretic.   HENT:     Head: Normocephalic and atraumatic.  Cardiovascular:     Rate and Rhythm: Normal rate and regular rhythm.     Heart sounds: Normal heart sounds.  Pulmonary:     Effort: Pulmonary effort is normal.     Breath sounds: Normal breath sounds.  Chest:     Chest wall: No tenderness.  Abdominal:     Palpations: Abdomen is soft.     Tenderness: There is no abdominal tenderness.  Musculoskeletal:       Back:     Right lower leg: No tenderness. No edema.     Left lower leg: No tenderness. No edema.  Skin:    General: Skin is warm and dry.     Findings: No erythema or rash.  Neurological:     Mental Status: He is alert and oriented to person, place, and time.  Psychiatric:        Behavior: Behavior normal.     ED Results / Procedures / Treatments   Labs (all labs ordered are listed, but only abnormal results are displayed) Labs Reviewed  CBC WITH DIFFERENTIAL/PLATELET - Abnormal; Notable for the following components:      Result Value   RBC 3.90 (*)    Hemoglobin 9.1 (*)    HCT 29.3 (*)    MCV 75.1 (*)    MCH 23.3 (*)    RDW 21.3 (*)    Platelets 475 (*)    All other components within normal limits  COMPREHENSIVE METABOLIC PANEL - Abnormal; Notable for the following components:   CO2 20 (*)    Glucose, Bld 171 (*)    BUN 22 (*)    Calcium 8.8 (*)    Total Protein 6.2 (*)    Albumin 2.1 (*)    All other components within normal limits  LIPASE, BLOOD  URINALYSIS, ROUTINE W REFLEX MICROSCOPIC  PATHOLOGIST SMEAR REVIEW  TROPONIN I (HIGH SENSITIVITY)  TROPONIN I (HIGH SENSITIVITY)    EKG EKG Interpretation  Date/Time:  Tuesday August 12 2022 04:56:34 EST Ventricular Rate:  86 PR Interval:  142 QRS Duration: 96 QT Interval:  388 QTC Calculation: 464 R Axis:   -17 Text Interpretation: Normal sinus rhythm RSR' or QR pattern in V1 suggests right ventricular conduction delay Possible Anterior infarct , age undetermined Abnormal ECG When compared with ECG of  02-Jun-2022 08:48, PREVIOUS ECG IS PRESENT Confirmed by Kristine Royal [phone]) on 08/12/2022 1:14:27 PM  Radiology DG Chest 2 View  Result Date: 08/12/2022 CLINICAL DATA:  56 year old male with chest pain. EXAM: CHEST - 2 VIEW COMPARISON:  CT Chest, Abdomen, and Pelvis 05/07/2022. Portable chest 06/01/2022. FINDINGS: Upright AP and lateral views at 0506 hours. Augmented  T12 compression fracture since August. Low normal lung volumes. Normal cardiac size and mediastinal contours. Visualized tracheal air column is within normal limits. Both lungs appear clear. No pneumothorax or pleural effusion. Chronic appearing proximal right humerus deformity. No acute osseous abnormality identified. Negative visible bowel gas. IMPRESSION: 1. No acute cardiopulmonary abnormality. 2. Augmented T12 compression fracture. Electronically Signed   By: Odessa Fleming M.D.   On: 08/12/2022 05:31    Procedures Procedures    Medications Ordered in ED Medications  lidocaine (LIDODERM) 5 % 1 patch (1 patch Transdermal Patch Applied 08/12/22 1336)    ED Course/ Medical Decision Making/ A&P                           Medical Decision Making Risk Prescription drug management.   This patient presents to the ED for concern of chronic back pain, chest pain, this involves an extensive number of treatment options, and is a complaint that carries with it a high risk of complications and morbidity.  The differential diagnosis includes but not limited to acute on chronic pain ACS   Co morbidities that complicate the patient evaluation  Diabetes, T12 compression fracture treated with kyphoplasty, spinal stenosis lumbar region with neurogenic claudication, hypertension   Additional history obtained:  Additional history obtained from interpreter who contributes to history as above External records from outside source obtained and reviewed including discharge summary dated 08/01/2022   Lab Tests:  I Ordered, and personally  interpreted labs.  The pertinent results include: CBC with hemoglobin 9.1, not significantly changed from prior.  CMP without significant changes compared to prior.  Lipase normal.  Troponins x 2 unremarkable.   Imaging Studies ordered:  I ordered imaging studies including chest x-ray I independently visualized and interpreted imaging which showed no acute, augmented T12 compression fracture I agree with the radiologist interpretation   Cardiac Monitoring: / EKG:  The patient was maintained on a cardiac monitor.  I personally viewed and interpreted the cardiac monitored which showed an underlying rhythm of: Normal sinus rhythm, rate 86   Problem List / ED Course / Critical interventions / Medication management  56 year old male brought in by EMS from nursing facility with presenting complaint of chest pain, resolved during prolonged wait, now with complaint of ongoing back pain after fall resulting in T12 compression fracture and prolonged hospital admission. Work up today is reassuring. CP has resolved. Complaint is ongoing back pain, likely from recent compression fracture. Lidoderm applied to area. DC back to facility.  I ordered medication including Lidoderm patch for back pain Reevaluation of the patient after these medicines showed that the patient improved I have reviewed the patients home medicines and have made adjustments as needed   Social Determinants of Health:  Lives at nursing facility   Test / Admission - Considered:  Felt stable for discharge after comprehensive ER workup         Final Clinical Impression(s) / ED Diagnoses Final diagnoses:  Nonspecific chest pain  Chronic back pain, unspecified back location, unspecified back pain laterality    Rx / DC Orders ED Discharge Orders     None         Jeannie Fend, PA-C 08/12/22 1559    Wynetta Fines, MD 08/13/22 [phone]

## 2022-08-12 NOTE — Discharge Instructions (Signed)
Apply lidocaine patch to back as needed as prescribed for pain. Follow up with your care team as scheduled. Return to the ER for worsening or concerning symptoms.

## 2022-08-12 NOTE — ED Triage Notes (Signed)
Pt BIB [facility] from home c/o centralized CP but unsure of anything else because pt is from [facility] and does not speak english. Pt took 324 ASA. Pt points to the middle of chest and points downward for pain.   122/76

## 2022-08-12 NOTE — ED Notes (Signed)
Rn attempted to call report to [place] x2

## 2022-08-12 NOTE — ED Notes (Signed)
[facility] CALLED THEY WILL ARRIVE  AROUND 3PM

## 2022-08-12 NOTE — ED Notes (Signed)
RN attempted to give report to [employer]

## 2022-08-13 ENCOUNTER — Telehealth: Payer: Self-pay

## 2022-08-13 LAB — PATHOLOGIST SMEAR REVIEW

## 2022-08-13 NOTE — Telephone Encounter (Signed)
Transition Care Management Unsuccessful Follow-up Telephone Call  Date of discharge and from where:  08/12/22  Attempts:  1st Attempt  Reason for unsuccessful TCM follow-up call:  Unable to reach patient will try to call back to case worker around 4:15 pm to speak to pt .   Renelda Loma RMA

## 2022-08-15 ENCOUNTER — Telehealth: Payer: Self-pay

## 2022-08-15 NOTE — Progress Notes (Signed)
Encounter opened in error

## 2022-08-15 NOTE — Telephone Encounter (Signed)
Transition Care Management Unsuccessful Follow-up Telephone Call  Date of discharge and from where:  08/12/2022  Attempts:  2nd Attempt  Reason for unsuccessful TCM follow-up call:  Left voice message pt is in rehab.  Renelda Loma RMA

## 2022-08-20 NOTE — Congregational Nurse Program (Signed)
Visited patient at [facility] facility.  C/O epigastric pain.  CN advised his nurse who gave him MOM due to constipation from pain meds.  Pain subsided and patient in good spirits. CN took his mail to him and called DSS Medicaid worker regarding letter he had received.  Met with Social Worker Elayne Guerin [phone]) and gave her list of contact phone numbers including CN, [name], [name], and Liana Adrong. Plan to visit in approximately 1 week with interpreter Diu Hartshorn.  [name] RN, Congregational Nurse [phone]

## 2022-08-26 ENCOUNTER — Ambulatory Visit (INDEPENDENT_AMBULATORY_CARE_PROVIDER_SITE_OTHER): Payer: Medicaid Other | Admitting: Orthopaedic Surgery

## 2022-08-26 ENCOUNTER — Ambulatory Visit: Payer: Self-pay

## 2022-08-26 ENCOUNTER — Encounter: Payer: Self-pay | Admitting: Orthopaedic Surgery

## 2022-08-26 VITALS — BP 108/75 | HR 78 | Ht 60.0 in | Wt 122.0 lb

## 2022-08-26 DIAGNOSIS — M48062 Spinal stenosis, lumbar region with neurogenic claudication: Secondary | ICD-10-CM

## 2022-08-26 DIAGNOSIS — G8929 Other chronic pain: Secondary | ICD-10-CM | POA: Diagnosis not present

## 2022-08-26 DIAGNOSIS — E43 Unspecified severe protein-calorie malnutrition: Secondary | ICD-10-CM | POA: Diagnosis not present

## 2022-08-26 DIAGNOSIS — M545 Low back pain, unspecified: Secondary | ICD-10-CM | POA: Diagnosis not present

## 2022-08-26 NOTE — Progress Notes (Signed)
Office Visit Note   Patient: Wayne Stokes           Date of Birth: 1966/06/22           MRN: [id number] Visit Date: 08/26/2022              Requested by: Kathrynn Speed, NP [address] [place],  [place] [address] PCP: Kathrynn Speed, NP   Assessment & Plan: Visit Diagnoses:  1. Chronic bilateral low back pain, unspecified whether sciatica present   2. Protein-calorie malnutrition, severe   3. Spinal stenosis of lumbar region with neurogenic claudication     Plan: The interpreter was post come to his visit today was not present we could not get 1 over the phone to translate.  He has severe protein malnourishment and needs to have this corrected and has severe stenosis at L4-5 with osteoporosis.  Will have him see [name] since this is a complex case with multiple comorbidities.  Follow-Up Instructions: No follow-ups on file.   Orders:  Orders Placed This Encounter  Procedures   XR Lumbar Spine 2-3 Views   No orders of the defined types were placed in this encounter.     Procedures: No procedures performed   Clinical Data: No additional findings.   Subjective: Chief Complaint  Patient presents with   Lower Back - Pain    HPI 56 year old male who speaks [place] Spire returns with L4-5 severe stenosis neurogenic claudication and inability to walk.  He was able to walk short distance and had a fall with a T12 compression.  I saw him in the hospital at the time with the degenerative changes at L4-5 he had an aspiration of the disc which was negative for infection.  He has severe stenosis multifactorial with endplate erosion and anterolisthesis at L4-5.  Kyphoplasty was ordered by his primary care physician and did well.  He continues to have back pain leg pain.  Recent diagnosed with probable rheumatoid arthritis.  Unfortunately today multiple tries we were not able to get an interpreter over the phone who spoke his language.  Patient remains with severe stenosis at L4-5  documented on his second MRI scan 05/26/2022.  Patient been told he is got some diabetes 9 months ago A1c was 9.3 last one 1 month ago was 6.2.  Patient has an albumin list that is less than 1.5.Total protein is 6.1.  Review of Systems positive for diabetes hyperlipidemia hypertension mild cognitive impairment.   Objective: Vital Signs: BP 108/75   Pulse 78   Ht 5' (1.524 m)   Wt 122 lb (55.3 kg)   BMI 23.83 kg/m   Physical Exam Constitutional:      Appearance: He is well-developed.  HENT:     Head: Normocephalic and atraumatic.     Right Ear: External ear normal.     Left Ear: External ear normal.  Eyes:     Pupils: Pupils are equal, round, and reactive to light.  Neck:     Thyroid: No thyromegaly.     Trachea: No tracheal deviation.  Cardiovascular:     Rate and Rhythm: Normal rate.  Pulmonary:     Effort: Pulmonary effort is normal.     Breath sounds: No wheezing.  Abdominal:     General: Bowel sounds are normal.     Palpations: Abdomen is soft.  Musculoskeletal:     Cervical back: Neck supple.  Skin:    General: Skin is warm and dry.  Capillary Refill: Capillary refill takes less than 2 seconds.  Neurological:     Mental Status: He is alert and oriented to person, place, and time.  Psychiatric:        Behavior: Behavior normal.        Thought Content: Thought content normal.        Judgment: Judgment normal.     Ortho Exam  Specialty Comments:  EXAM: MRI LUMBAR SPINE WITHOUT CONTRAST   TECHNIQUE: Multiplanar, multisequence MR imaging of the lumbar spine was performed. No intravenous contrast was administered.   COMPARISON:  Radiographs December 23, 2021.   FINDINGS: Segmentation: A transitional lumbosacral vertebra is assumed to represent a partially sacralized L5 level. Careful correlation with this numbering strategy prior to any procedural intervention would be recommended. d.   Alignment:  Grade 1 anterolisthesis of L4 over L5.   Vertebrae: No  fracture, evidence of discitis, or bone lesion. Endplate degenerative changes at L4-5. Congenitally small spinal canal.   Conus medullaris and cauda equina: Conus extends to the L1-2 level. Conus and cauda equina appear normal.   Paraspinal and other soft tissues: Negative.   Disc levels:   T12-L1: No significant spinal canal or neural foraminal stenosis.   L1-2: Disc bulge and mild facet degenerative changes with associated epidural lipomatosis resulting in mild spinal canal stenosis and mild left neural foraminal narrowing.   L2-3: Disc bulge, mild to moderate facet degenerative changes and epidural lipomatosis resulting in moderate spinal canal stenosis and mild left neural foraminal narrowing.   L3-4: Disc bulge, mild-to-moderate facet degenerative changes and epidural lipomatosis resulting in moderate spinal canal stenosis, mild right and moderate left neural neural.   L4-5: Anterolisthesis, disc bulge with superimposed right foraminal disc protrusion and advanced facet degenerative changes with ligamentum flavum redundancy resulting in severe spinal canal stenosis, severe right and moderate left neural foraminal narrowing.   L5-S1: No spinal canal or neural foraminal stenosis.   IMPRESSION: 1. 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 2. 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 3. Severe right and moderate left neural foraminal narrowing at L4-5.     Electronically Signed   By: Baldemar Lenis M.D.   On: 02/26/2022 14:12  Imaging: No results found.   PMFS History: Patient Active Problem List   Diagnosis Date Noted   Probable Rheumatoid arthritis (HCC) 08/01/2022   Protein-calorie malnutrition, severe 07/19/2022   Thrush, oral 06/01/2022   T12 compression  fracture (HCC) 05/26/2022   Hyponatremia 05/26/2022   Iron deficiency anemia 05/26/2022   Hypertension associated with diabetes (HCC) 05/26/2022   Hyperlipidemia associated with type 2 diabetes mellitus (HCC) 05/26/2022   Mild cognitive impairment 05/26/2022   Acute urinary retention 05/26/2022   Thoracic compression fracture (HCC) 05/13/2022   Spinal stenosis of lumbar region with neurogenic claudication 05/13/2022   Type 2 diabetes mellitus without complication, without long-term current use of insulin (HCC) 03/17/2022   Past Medical History:  Diagnosis Date   Diabetes mellitus without complication (HCC)    Hypertension     Family History  Family history unknown: Yes    Past Surgical History:  Procedure Laterality Date   IR KYPHO LUMBAR INC FX REDUCE BONE BX UNI/BIL CANNULATION INC/IMAGING  06/18/2022   IR LUMBAR DISC ASPIRATION W/IMG GUIDE  06/02/2022   Social History   Occupational History   Not  on file  Tobacco Use   Smoking status: Never   Smokeless tobacco: Never  Vaping Use   Vaping Use: Never used  Substance and Sexual Activity   Alcohol use: Never   Drug use: Never   Sexual activity: Not Currently

## 2022-08-27 ENCOUNTER — Encounter: Payer: Self-pay | Admitting: Orthopedic Surgery

## 2022-08-27 ENCOUNTER — Ambulatory Visit (INDEPENDENT_AMBULATORY_CARE_PROVIDER_SITE_OTHER): Payer: Medicaid Other

## 2022-08-27 ENCOUNTER — Ambulatory Visit (INDEPENDENT_AMBULATORY_CARE_PROVIDER_SITE_OTHER): Payer: Medicaid Other | Admitting: Orthopedic Surgery

## 2022-08-27 VITALS — BP 143/80 | HR 74 | Ht 60.0 in | Wt 122.0 lb

## 2022-08-27 DIAGNOSIS — M545 Low back pain, unspecified: Secondary | ICD-10-CM

## 2022-08-27 DIAGNOSIS — G8929 Other chronic pain: Secondary | ICD-10-CM

## 2022-08-27 NOTE — Progress Notes (Signed)
Orthopedic Spine Surgery Office Note  Assessment: Patient is a 56 y.o. male with low back pain that radiates into bilateral thighs (R>L). Has decreased sensation down the posterior aspect of bilateral legs.    Plan: -His MRI from 05/2022 is non-diagnostic. The axial slices have significant motion artifact. Recommended new MRI with and without contrast to evaluate for infection or tumor given the L5 vertebral bone loss and disc height loss.  -Ordered ESR/CRP/CBC to rule out infection. Also ordered quantiferon gold given the anterior destruction and history of living in [place] with night sweat and chills (low suspicion though given single vertebra affected and no anterior abscess seen) -Can be out of bed as tolerated, no brace -Recommend treatment for osteoporosis, they will start this at his facility -Patient should return to office in 5 weeks with the MRI and blood work, x-rays at next visit: none   Patient expressed understanding of the plan and all questions were answered to the patient's satisfaction.   ___________________________________________________________________________   History:  Patient is a 56 y.o. male who presents today for lumbar spine. Patient was in the hospital recently and was discharged to a SNF. He was in the hospital for low back pain and difficulty ambulating. He was found to have a T12 compression fracture and underwent kyphoplasty with IR. He was noted to have urinary retention and was discharged with a Foley. Still has the Foley in place. He had shoulder and wrist pain as well with an elevated CCP so he is supposed to see rheumatology in the outpatient setting, but has not seen anyone from rheumatology yet. He was seen by NSGY during the admission and my partner. He was referred to me yesterday by my partner.  Patient has had low back pain the radiates into bilateral lower extremities for the past 1.5 years. Pain radiates from his low back into his bilateral thighs.  It is worse on the right side. He also has numbness and paresthesias down the posterior aspect of the bilateral legs to the feet. Feels his legs have gotten weaker and has been using ambulatory aids to get around because of this weakness. There was no trauma or injury that brought on the pain. Pain has been getting progressively worse with time.   Of note, has had biopsy of the L4/5 disc on 06/02/2022 with radiology that did not show any organisms on culture or gram stain.   Weakness: yes, feels his bilateral legs are weaker Symptoms of imbalance: yes, due to leg weakness Paresthesias and numbness: yes, numbness and paresthesias down the posterior aspect of the legs Bowel or bladder incontinence: has Foley in from hospital, no bowel habit changes Saddle anesthesia: denies  Treatments tried: tylenol, activity modification, PT, ambulatory aids   Review of systems: Denies fevers, unexplained weight loss, history of cancer, pain that wakes them at night. Has had chills and night sweats  Past medical history: DM (A1c 6.2 on 06/28/2022) HTN  Allergies: NKDA  Past surgical history:  T12 kyphoplasty  Social history: Denies use of nicotine product (smoking, vaping, patches, smokeless) Alcohol use: denies Denies recreational drug use  Physical Exam:  General: no acute distress, appears stated age Neurologic: alert, answering questions appropriately, following commands Respiratory: unlabored breathing on room air, symmetric chest rise Psychiatric: appropriate affect, normal cadence to speech   MSK (spine):  -Strength exam      Left  Right EHL    4/5  4/5 TA    4/5  4/5 GSC    5/5  5/5 Knee extension  5/5  5/5 Hip flexion   5/5  5/5  -Sensory exam    Sensation intact to light touch in L3-S1 nerve distributions of bilateral lower extremities  -Achilles DTR: 1/4 on the left, 1/4 on the right -Patellar tendon DTR: 1/4 on the left, 1/4 on the right  -Straight leg raise: negative  bilaterally -Femoral nerve stretch test: negative bilaterally -Clonus: no beats bilaterally -Negative hoffman bialterally -Negative grip and release test -No interosseous muscle wasting seen  -Left hip exam: no pain through range of motion, negative stinchfield, negative FABER -Right hip exam: no pain through range of motion, negative stinchfield, negative FABER  Imaging: XR of the lumbar spine from 08/26/2022 and 08/27/2022 was independently reviewed and interpreted, showing spondylolisthesis at L3/4 with loss of disc height. Cement in the T12 vertebral body. Asymmetric collapse through the T12 fracture. Bony destruction at L4/5 adjacent to the disc spaces. On flexion view, the L4 body disc space is decreased and the L4 body abuts the L5 body.   MRI was obtained on 05/26/22 but was non-diagnostic due to motion artifact. On the sagittals though no anterior abscess is seen. There is bone loss on the anterior aspect of L5 with L4 migrating caudally into the L5 vertebra. Subtle edema is seen around the endplates at V6/1.    Patient name: Wayne Stokes Patient MRN: [id number] Date of visit: 08/27/22

## 2022-10-01 ENCOUNTER — Ambulatory Visit: Payer: No Typology Code available for payment source | Admitting: Orthopedic Surgery

## 2022-10-29 NOTE — Congregational Nurse Program (Signed)
CN and interpreter Diu Hartshorn visited patient at [facility].  Patient was sleeping upon our arrival.  Upon awakening he smiled and appeared pleased to see [place].  Exhibited no signs of pain during our visit but stated he has frequent issues with back pain which is relieved by medication.  Interpreter told patient she had attempted to call him several times with no answer. Patient had difficulty figuring out how to answer his phone when she had him practice.  After multiple tries he seemed to grasp concept of swiping up to answer.  He shared some concerns about the food and we helped him write a "dietary request list" including a low sodium diet (he states everything is too salty), more vegetables (green beans, cucumbers, cabbage and rice), NO chicken, but likes pork and beef without salt.  Stated he is unable to walk and uses wheel chair.  Prayed with patient and told him we would visit again  soon.  [name] RN, Congregational Nurse [phone]

## 2022-12-18 ENCOUNTER — Ambulatory Visit: Payer: Self-pay | Admitting: Neurology

## 2022-12-18 ENCOUNTER — Encounter: Payer: Self-pay | Admitting: Neurology

## 2023-05-15 NOTE — Congregational Nurse Program (Signed)
CN and interpreter Wayne Stokes visited patient at [facility].  He was pleasant and in no acute distress.  Stated his back and knees continues to hurt intermittently.  He appears to have lost weight but did not answer question regarding how well he is eating.  His main concern was to find out about his disability application status.  We called [name] with [name] who is working on his application.  Stated she recently called [employer] and was advised application is still pending and that she and patient need to go to the local office to inquire about status.  She will visit patient next week and take him to [facility].  The other concern was patient's nail length.  States he has nail clippers but is too weak to do it himself.  CN went to nurse's station and put in request for someone to help him. Said they would do the fingernails but depending on the condition of his toenails they way need to wait for their podiatrist.  Brantley Fling RN, Congregational Nurse [phone]

## 2023-09-03 ENCOUNTER — Encounter: Payer: Self-pay | Admitting: Nurse Practitioner

## 2023-09-03 ENCOUNTER — Encounter (HOSPITAL_COMMUNITY): Payer: Self-pay

## 2023-09-03 ENCOUNTER — Inpatient Hospital Stay (HOSPITAL_COMMUNITY): Payer: Medicaid Other

## 2023-09-03 ENCOUNTER — Emergency Department (HOSPITAL_COMMUNITY): Payer: Medicaid Other

## 2023-09-03 ENCOUNTER — Other Ambulatory Visit: Payer: Self-pay

## 2023-09-03 ENCOUNTER — Inpatient Hospital Stay (HOSPITAL_COMMUNITY)
Admission: EM | Admit: 2023-09-03 | Discharge: 2023-09-07 | DRG: 315 | Disposition: A | Discharge status: Skilled Nursing Facility | Payer: Medicaid Other | Source: Skilled Nursing Facility | Attending: Student | Admitting: Student

## 2023-09-03 ENCOUNTER — Ambulatory Visit (INDEPENDENT_AMBULATORY_CARE_PROVIDER_SITE_OTHER): Payer: No Typology Code available for payment source | Admitting: Nurse Practitioner

## 2023-09-03 VITALS — BP 87/64 | HR 106 | Temp 97.1°F

## 2023-09-03 DIAGNOSIS — G8929 Other chronic pain: Secondary | ICD-10-CM | POA: Diagnosis present

## 2023-09-03 DIAGNOSIS — I9589 Other hypotension: Secondary | ICD-10-CM | POA: Diagnosis present

## 2023-09-03 DIAGNOSIS — R7989 Other specified abnormal findings of blood chemistry: Secondary | ICD-10-CM | POA: Diagnosis present

## 2023-09-03 DIAGNOSIS — I959 Hypotension, unspecified: Secondary | ICD-10-CM | POA: Diagnosis not present

## 2023-09-03 DIAGNOSIS — I10 Essential (primary) hypertension: Secondary | ICD-10-CM | POA: Diagnosis present

## 2023-09-03 DIAGNOSIS — Z91018 Allergy to other foods: Secondary | ICD-10-CM | POA: Diagnosis not present

## 2023-09-03 DIAGNOSIS — E0590 Thyrotoxicosis, unspecified without thyrotoxic crisis or storm: Secondary | ICD-10-CM | POA: Diagnosis present

## 2023-09-03 DIAGNOSIS — Z91013 Allergy to seafood: Secondary | ICD-10-CM

## 2023-09-03 DIAGNOSIS — Z13220 Encounter for screening for lipoid disorders: Secondary | ICD-10-CM

## 2023-09-03 DIAGNOSIS — N400 Enlarged prostate without lower urinary tract symptoms: Secondary | ICD-10-CM | POA: Diagnosis present

## 2023-09-03 DIAGNOSIS — N390 Urinary tract infection, site not specified: Secondary | ICD-10-CM | POA: Diagnosis present

## 2023-09-03 DIAGNOSIS — R5381 Other malaise: Secondary | ICD-10-CM | POA: Diagnosis present

## 2023-09-03 DIAGNOSIS — Z7984 Long term (current) use of oral hypoglycemic drugs: Secondary | ICD-10-CM

## 2023-09-03 DIAGNOSIS — E861 Hypovolemia: Secondary | ICD-10-CM | POA: Diagnosis present

## 2023-09-03 DIAGNOSIS — E871 Hypo-osmolality and hyponatremia: Secondary | ICD-10-CM | POA: Diagnosis present

## 2023-09-03 DIAGNOSIS — E119 Type 2 diabetes mellitus without complications: Secondary | ICD-10-CM | POA: Diagnosis present

## 2023-09-03 DIAGNOSIS — D509 Iron deficiency anemia, unspecified: Secondary | ICD-10-CM | POA: Diagnosis present

## 2023-09-03 DIAGNOSIS — M549 Dorsalgia, unspecified: Secondary | ICD-10-CM | POA: Diagnosis present

## 2023-09-03 DIAGNOSIS — Z79899 Other long term (current) drug therapy: Secondary | ICD-10-CM | POA: Diagnosis not present

## 2023-09-03 DIAGNOSIS — Z91012 Allergy to eggs: Secondary | ICD-10-CM

## 2023-09-03 DIAGNOSIS — Z91014 Allergy to mammalian meats: Secondary | ICD-10-CM

## 2023-09-03 DIAGNOSIS — M7989 Other specified soft tissue disorders: Secondary | ICD-10-CM | POA: Diagnosis not present

## 2023-09-03 DIAGNOSIS — Z139 Encounter for screening, unspecified: Secondary | ICD-10-CM

## 2023-09-03 DIAGNOSIS — Z603 Acculturation difficulty: Secondary | ICD-10-CM | POA: Diagnosis present

## 2023-09-03 DIAGNOSIS — I422 Other hypertrophic cardiomyopathy: Secondary | ICD-10-CM | POA: Diagnosis not present

## 2023-09-03 DIAGNOSIS — Z993 Dependence on wheelchair: Secondary | ICD-10-CM

## 2023-09-03 DIAGNOSIS — E876 Hypokalemia: Secondary | ICD-10-CM | POA: Diagnosis present

## 2023-09-03 DIAGNOSIS — N3090 Cystitis, unspecified without hematuria: Secondary | ICD-10-CM | POA: Diagnosis present

## 2023-09-03 LAB — PROTIME-INR
INR: 1.1 (ref 0.8–1.2)
Prothrombin Time: 14.7 s (ref 11.4–15.2)

## 2023-09-03 LAB — URINALYSIS, W/ REFLEX TO CULTURE (INFECTION SUSPECTED)
Bilirubin Urine: NEGATIVE
Glucose, UA: NEGATIVE mg/dL
Hgb urine dipstick: NEGATIVE
Ketones, ur: NEGATIVE mg/dL
Nitrite: NEGATIVE
Protein, ur: NEGATIVE mg/dL
Specific Gravity, Urine: 1.029 (ref 1.005–1.030)
pH: 6 (ref 5.0–8.0)

## 2023-09-03 LAB — CBC
HCT: 24.8 % — ABNORMAL LOW (ref 39.0–52.0)
Hemoglobin: 7.5 g/dL — ABNORMAL LOW (ref 13.0–17.0)
MCH: 22.4 pg — ABNORMAL LOW (ref 26.0–34.0)
MCHC: 30.2 g/dL (ref 30.0–36.0)
MCV: 74 fL — ABNORMAL LOW (ref 80.0–100.0)
Platelets: 369 10*3/uL (ref 150–400)
RBC: 3.35 MIL/uL — ABNORMAL LOW (ref 4.22–5.81)
RDW: 17.2 % — ABNORMAL HIGH (ref 11.5–15.5)
WBC: 7.8 10*3/uL (ref 4.0–10.5)
nRBC: 0 % (ref 0.0–0.2)

## 2023-09-03 LAB — CREATININE, SERUM
Creatinine, Ser: 0.43 mg/dL — ABNORMAL LOW (ref 0.61–1.24)
GFR, Estimated: >60 mL/min (ref >60)

## 2023-09-03 LAB — COMPREHENSIVE METABOLIC PANEL
ALT: <5 U/L (ref 0–44)
AST: 17 U/L (ref 15–41)
Albumin: 2.1 g/dL — ABNORMAL LOW (ref 3.5–5.0)
Alkaline Phosphatase: 68 U/L (ref 38–126)
Anion gap: 10 (ref 5–15)
BUN: 13 mg/dL (ref 6–20)
CO2: 25 mmol/L (ref 22–32)
Calcium: 8.6 mg/dL — ABNORMAL LOW (ref 8.9–10.3)
Chloride: 97 mmol/L — ABNORMAL LOW (ref 98–111)
Creatinine, Ser: 0.61 mg/dL (ref 0.61–1.24)
GFR, Estimated: >60 mL/min (ref >60)
Glucose, Bld: 135 mg/dL — ABNORMAL HIGH (ref 70–99)
Potassium: 4.3 mmol/L (ref 3.5–5.1)
Sodium: 132 mmol/L — ABNORMAL LOW (ref 135–145)
Total Bilirubin: 0.4 mg/dL (ref <1.2)
Total Protein: 7.8 g/dL (ref 6.5–8.1)

## 2023-09-03 LAB — CBC WITH DIFFERENTIAL/PLATELET
Abs Immature Granulocytes: 0.1 10*3/uL — ABNORMAL HIGH (ref 0.00–0.07)
Basophils Absolute: 0.1 10*3/uL (ref 0.0–0.1)
Basophils Relative: 1 %
Eosinophils Absolute: 0.5 10*3/uL (ref 0.0–0.5)
Eosinophils Relative: 3 %
HCT: 30.1 % — ABNORMAL LOW (ref 39.0–52.0)
Hemoglobin: 9.4 g/dL — ABNORMAL LOW (ref 13.0–17.0)
Immature Granulocytes: 1 %
Lymphocytes Relative: 23 %
Lymphs Abs: 3.1 10*3/uL (ref 0.7–4.0)
MCH: 23 pg — ABNORMAL LOW (ref 26.0–34.0)
MCHC: 31.2 g/dL (ref 30.0–36.0)
MCV: 73.8 fL — ABNORMAL LOW (ref 80.0–100.0)
Monocytes Absolute: 1.1 10*3/uL — ABNORMAL HIGH (ref 0.1–1.0)
Monocytes Relative: 8 %
Neutro Abs: 8.9 10*3/uL — ABNORMAL HIGH (ref 1.7–7.7)
Neutrophils Relative %: 64 %
Platelets: 464 10*3/uL — ABNORMAL HIGH (ref 150–400)
RBC: 4.08 MIL/uL — ABNORMAL LOW (ref 4.22–5.81)
RDW: 17.3 % — ABNORMAL HIGH (ref 11.5–15.5)
WBC: 13.8 10*3/uL — ABNORMAL HIGH (ref 4.0–10.5)
nRBC: 0 % (ref 0.0–0.2)

## 2023-09-03 LAB — T4, FREE: Free T4: 1.33 ng/dL — ABNORMAL HIGH (ref 0.61–1.12)

## 2023-09-03 LAB — POCT GLYCOSYLATED HEMOGLOBIN (HGB A1C): Hemoglobin A1C: 6.1 % — AB (ref 4.0–5.6)

## 2023-09-03 LAB — LIPASE, BLOOD: Lipase: 27 U/L (ref 11–51)

## 2023-09-03 LAB — I-STAT CG4 LACTIC ACID, ED
Lactic Acid, Venous: 2.2 mmol/L (ref 0.5–1.9)
Lactic Acid, Venous: 2 mmol/L (ref 0.5–1.9)

## 2023-09-03 LAB — D-DIMER, QUANTITATIVE: D-Dimer, Quant: 3.91 ug{FEU}/mL — ABNORMAL HIGH (ref 0.00–0.50)

## 2023-09-03 LAB — TSH: TSH: 0.348 u[IU]/mL — ABNORMAL LOW (ref 0.350–4.500)

## 2023-09-03 LAB — CBG MONITORING, ED: Glucose-Capillary: 103 mg/dL — ABNORMAL HIGH (ref 70–99)

## 2023-09-03 LAB — TROPONIN I (HIGH SENSITIVITY): Troponin I (High Sensitivity): <2 ng/L (ref <18)

## 2023-09-03 LAB — APTT: aPTT: 31 s (ref 24–36)

## 2023-09-03 LAB — HIV ANTIBODY (ROUTINE TESTING W REFLEX): HIV Screen 4th Generation wRfx: NONREACTIVE

## 2023-09-03 LAB — CORTISOL: Cortisol, Plasma: 8.5 ug/dL

## 2023-09-03 MED ORDER — SODIUM CHLORIDE 0.9 % IV BOLUS
1000.0000 mL | Freq: Once | INTRAVENOUS | Status: AC
  Administered 2023-09-03: 1000 mL via INTRAVENOUS

## 2023-09-03 MED ORDER — INSULIN ASPART 100 UNIT/ML IJ SOLN
0.0000 [IU] | Freq: Every day | INTRAMUSCULAR | Status: DC
  Administered 2023-09-05: 2 [IU] via SUBCUTANEOUS
  Filled 2023-09-03: qty 0.05

## 2023-09-03 MED ORDER — SODIUM CHLORIDE 0.9 % IV SOLN
2.0000 g | INTRAVENOUS | Status: AC
  Administered 2023-09-04 – 2023-09-06 (×3): 2 g via INTRAVENOUS
  Filled 2023-09-03 (×3): qty 20

## 2023-09-03 MED ORDER — IOHEXOL 300 MG/ML  SOLN
100.0000 mL | Freq: Once | INTRAMUSCULAR | Status: AC | PRN
  Administered 2023-09-03: 100 mL via INTRAVENOUS

## 2023-09-03 MED ORDER — ACETAMINOPHEN 650 MG RE SUPP: 650.0000 mg | Freq: Four times a day (QID) | RECTAL | Status: DC | PRN

## 2023-09-03 MED ORDER — DEXTROSE IN LACTATED RINGERS 5 % IV SOLN: INTRAVENOUS | Status: DC

## 2023-09-03 MED ORDER — INSULIN ASPART 100 UNIT/ML IJ SOLN
0.0000 [IU] | Freq: Three times a day (TID) | INTRAMUSCULAR | Status: DC
  Filled 2023-09-03: qty 0.06

## 2023-09-03 MED ORDER — MIDODRINE HCL 5 MG PO TABS
10.0000 mg | ORAL_TABLET | Freq: Three times a day (TID) | ORAL | Status: DC
  Administered 2023-09-03 – 2023-09-04 (×4): 10 mg via ORAL
  Filled 2023-09-03 (×4): qty 2

## 2023-09-03 MED ORDER — SODIUM CHLORIDE 0.9% FLUSH
3.0000 mL | Freq: Two times a day (BID) | INTRAVENOUS | Status: DC
  Administered 2023-09-03 – 2023-09-07 (×7): 3 mL via INTRAVENOUS

## 2023-09-03 MED ORDER — MIDODRINE HCL 5 MG PO TABS: 5.0000 mg | ORAL_TABLET | Freq: Three times a day (TID) | ORAL | Status: DC

## 2023-09-03 MED ORDER — PANTOPRAZOLE SODIUM 40 MG PO TBEC
40.0000 mg | DELAYED_RELEASE_TABLET | Freq: Every day | ORAL | Status: DC
  Administered 2023-09-04 – 2023-09-07 (×4): 40 mg via ORAL
  Filled 2023-09-03 (×4): qty 1

## 2023-09-03 MED ORDER — POLYETHYLENE GLYCOL 3350 17 G PO PACK: 17.0000 g | PACK | Freq: Every day | ORAL | Status: DC | PRN

## 2023-09-03 MED ORDER — ACETAMINOPHEN 325 MG PO TABS: 650.0000 mg | ORAL_TABLET | Freq: Four times a day (QID) | ORAL | Status: DC | PRN

## 2023-09-03 MED ORDER — SODIUM CHLORIDE 0.9 % IV SOLN
2.0000 g | Freq: Once | INTRAVENOUS | Status: AC
  Administered 2023-09-03: 2 g via INTRAVENOUS
  Filled 2023-09-03: qty 20

## 2023-09-03 MED ORDER — ENOXAPARIN SODIUM 40 MG/0.4ML IJ SOSY
40.0000 mg | PREFILLED_SYRINGE | INTRAMUSCULAR | Status: DC
  Administered 2023-09-04: 40 mg via SUBCUTANEOUS
  Filled 2023-09-03: qty 0.4

## 2023-09-03 MED ORDER — SODIUM CHLORIDE 0.9 % IV BOLUS (SEPSIS)
1000.0000 mL | Freq: Once | INTRAVENOUS | Status: AC
  Administered 2023-09-03: 1000 mL via INTRAVENOUS

## 2023-09-03 NOTE — Progress Notes (Signed)
Subjective   Patient ID: Wayne Stokes, male    DOB: June 18, 1966, 57 y.o.   MRN: [id number]  Chief Complaint  Patient presents with   Diabetes   Hypertension   Hyperlipidemia   Medical Management of Chronic Issues    Referring provider: No ref. provider found  Wayne Stokes is a 57 y.o. male with Past Medical History: No date: Diabetes mellitus without complication (HCC) No date: Hypertension  HPI  Follow up today - Patient has not been seen in this office in over 1 year. Currently in nursing home [facility]. He has an interpreter and He has a representative here with him from [employer] services organization ([name]). [name] is wanting to help patient get social security check. He needs a statement stating that he is able to make financial decisions for himself. He states that he is not happy at the facility because they do not give him enough food.   Diabetes:  Patient is currently on metformin.  A1c in office today is 6.4.  Patient is compliant with medications.   Hypertension:  Patient is currently on metoprolol and losartan.  He does have parameters to hold blood pressure medicines if blood pressure is under 125 systolic.  Record from nursing home shows that he has been given this medication each day.  Patient's blood pressure is very low in the office today.  Patient is feeling very weak.  We will transport him to the emergency room for further evaluation.    Hyperlipidemia:  Currently on lipitor  Note: We will place a referral for social worker to help with patient today. Patient is not capable of caring for himself at home.  He would need in-home caregiver 24/7 if he decides to leave nursing home. A  Patient was transported to the ED today after feeling very weak and sick in office. Blood pressure did drop in office.   Allergies  Allergen Reactions   Beef-Derived Drug Products Swelling   Shellfish Allergy Rash   Egg-Derived Products Swelling   Other Other (See Comments)     Egg plant , pizza - swelling    Immunization History  Administered Date(s) Administered   Influenza Inj Mdck Quad Pf 05/31/2022   Influenza,inj,Quad PF,6+ Mos 06/26/2021   Influenza-Unspecified 07/29/2023   PFIZER(Purple Top)SARS-COV-2 Vaccination 03/15/2020, 04/07/2020   PNEUMOCOCCAL CONJUGATE-20 05/31/2022    Tobacco History: Social History   Tobacco Use  Smoking Status Never  Smokeless Tobacco Never   Counseling given: Not Answered   Outpatient Encounter Medications as of 09/03/2023  Medication Sig   acetaminophen (TYLENOL) 325 MG tablet Take 2 tablets (650 mg total) by mouth every 6 (six) hours as needed for mild pain, fever or headache.   ascorbic acid (VITAMIN C) 250 MG CHEW Chew 250 mg by mouth daily.   atorvastatin (LIPITOR) 40 MG tablet Take 1 tablet (40 mg total) by mouth daily.   bisacodyl (DULCOLAX) 10 MG suppository Place 1 suppository (10 mg total) rectally daily as needed for moderate constipation.   cholecalciferol (VITAMIN D3) 10 MCG/ML LIQD oral liquid Take 400 Units by mouth daily.   cyclobenzaprine (FLEXERIL) 5 MG tablet Take 5 mg by mouth 3 (three) times daily as needed for muscle spasms.   diclofenac (VOLTAREN) 75 MG EC tablet Take 1 tablet (75 mg total) by mouth 2 (two) times daily.   docusate sodium (COLACE) 100 MG capsule Take 100 mg by mouth 2 (two) times daily.   feeding supplement (BOOST HIGH PROTEIN) LIQD Take 237  mLs by mouth 3 (three) times daily between meals.   ferrous sulfate 300 (60 Fe) MG/5ML syrup Take 5 mLs (300 mg total) by mouth daily with breakfast.   finasteride (PROSCAR) 5 MG tablet Take 1 tablet (5 mg total) by mouth daily.   losartan (COZAAR) 50 MG tablet Take 1 tablet (50 mg total) by mouth daily.   metoprolol succinate (TOPROL-XL) 25 MG 24 hr tablet Take 0.5 tablets (12.5 mg total) by mouth daily.   Omega 3 1000 MG CAPS Take 1,000 mg by mouth daily.   oxyCODONE-acetaminophen (PERCOCET) 5-325 MG tablet Take 1 tablet by mouth  every 4 (four) hours as needed for severe pain.   pantoprazole (PROTONIX) 40 MG tablet Take 1 tablet (40 mg total) by mouth daily.   tamsulosin (FLOMAX) 0.4 MG CAPS capsule Take 1 capsule (0.4 mg total) by mouth daily after supper.   donepezil (ARICEPT) 5 MG tablet Take 1 tablet (5 mg total) by mouth at bedtime.   gabapentin (NEURONTIN) 300 MG capsule Take 1 capsule (300 mg total) by mouth 3 (three) times daily.   metFORMIN (GLUCOPHAGE) 500 MG tablet Take 1 tablet (500 mg total) by mouth 2 (two) times daily with a meal.   No facility-administered encounter medications on file as of 09/03/2023.    Review of Systems  Review of Systems  Constitutional: Negative.   HENT: Negative.    Cardiovascular: Negative.   Gastrointestinal: Negative.   Allergic/Immunologic: Negative.   Neurological: Negative.   Psychiatric/Behavioral: Negative.       Objective:   BP (!) 87/64   Pulse (!) 106   Temp (!) 97.1 F (36.2 C)   SpO2 97%   Wt Readings from Last 5 Encounters:  08/27/22 122 lb (55.3 kg)  08/26/22 122 lb (55.3 kg)  08/01/22 122 lb 5.7 oz (55.5 kg)  05/13/22 138 lb (62.6 kg)  03/17/22 138 lb (62.6 kg)     Physical Exam Vitals and nursing note reviewed.  Constitutional:      General: He is in acute distress.     Appearance: He is well-developed. He is ill-appearing.  Cardiovascular:     Rate and Rhythm: Normal rate and regular rhythm.  Pulmonary:     Effort: Pulmonary effort is normal.     Breath sounds: Normal breath sounds.  Skin:    General: Skin is warm and dry.  Neurological:     Mental Status: He is alert and oriented to person, place, and time.       Assessment & Plan:   Encounter for screening involving social determinants of health (SDoH) -     AMB Referral VBCI Care Management  Type 2 diabetes mellitus without complication, without long-term current use of insulin (HCC) -     POCT glycosylated hemoglobin (Hb A1C) -     CBC -     Comprehensive metabolic  panel  Lipid screening -     Lipid panel     No follow-ups on file.   Ivonne Andrew, NP 09/03/2023

## 2023-09-03 NOTE — Assessment & Plan Note (Addendum)
This is the principal reason for presentation, is largely incidentally found at doctor's office.  Patient has been getting metoprolol and losartan at his nursing home without fail.  We will obviously hold these.  Troponin negative, D-dimer is elevated we will start with lower extremity Doppler to rule out venous thromboembolism.  Check echo, TSH is minimally below normal limits.  I think that is nonspecific.  We will place the patient on progressive care.  Cortisol and ACTH are pending.  I will start the patient on midodrine till blood pressure improves. Patient looking non toxic.  Patient received 3 L of normal saline in the ER.  Auscultated some crackles in the lung at this time.  Will hold off on further IV fluids.  Patient not hypoxic.  Echo ordered for morning.  Given patient's mentation, at this time patient is not felt to be in shock.  Also his creatinine is normal.  We will monitor patient's urine output.  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  This was replaced in the ER.

## 2023-09-03 NOTE — ED Provider Notes (Addendum)
[place] [facility] AT [facility] Provider Note   CSN: [id number] Arrival date & time: 09/03/23  1052     History  Chief Complaint  Patient presents with   Hypotension   Abdominal Pain    Wayne Stokes is a 57 y.o. male.  57 yo M with a chief complaints of hypotension.  This was noticed incidentally at his doctor's visit.  Per the interpreter the patient had gone for his normal checkup today.  While he was there he suddenly felt unwell felt hot all over had some abdominal pain chest pain and a headache.  They found that his blood pressure was low and sent him to the ED for evaluation.  Prior to this event he reportedly was at his baseline.  Denied cough congestion or fever denied nausea vomiting or diarrhea.  Has been eating normally for him.  A language interpreter was used.  Abdominal Pain      Home Medications Prior to Admission medications   Medication Sig Start Date End Date Taking? Authorizing Provider  acetaminophen (TYLENOL) 325 MG tablet Take 2 tablets (650 mg total) by mouth every 6 (six) hours as needed for mild pain, fever or headache. 08/01/22   Calvert Cantor, MD  ascorbic acid (VITAMIN C) 250 MG CHEW Chew 250 mg by mouth daily.    [provider]  atorvastatin (LIPITOR) 40 MG tablet Take 1 tablet (40 mg total) by mouth daily. 11/18/21   [name], [name], NP  bisacodyl (DULCOLAX) 10 MG suppository Place 1 suppository (10 mg total) rectally daily as needed for moderate constipation. 08/01/22   Calvert Cantor, MD  cholecalciferol (VITAMIN D3) 10 MCG/ML LIQD oral liquid Take 400 Units by mouth daily.    [provider]  cyclobenzaprine (FLEXERIL) 5 MG tablet Take 5 mg by mouth 3 (three) times daily as needed for muscle spasms.    [provider]  diclofenac (VOLTAREN) 75 MG EC tablet Take 1 tablet (75 mg total) by mouth 2 (two) times daily. 05/19/22   Elson Areas, PA-C  docusate sodium (COLACE) 100 MG capsule Take 100 mg  by mouth 2 (two) times daily.    [provider]  donepezil (ARICEPT) 5 MG tablet Take 1 tablet (5 mg total) by mouth at bedtime. 12/12/21   Windell Norfolk, MD  feeding supplement (BOOST HIGH PROTEIN) LIQD Take 237 mLs by mouth 3 (three) times daily between meals. 08/01/22   Calvert Cantor, MD  ferrous sulfate 300 (60 Fe) MG/5ML syrup Take 5 mLs (300 mg total) by mouth daily with breakfast. 08/01/22   Calvert Cantor, MD  finasteride (PROSCAR) 5 MG tablet Take 1 tablet (5 mg total) by mouth daily. 08/01/22   Calvert Cantor, MD  gabapentin (NEURONTIN) 300 MG capsule Take 1 capsule (300 mg total) by mouth 3 (three) times daily. 05/15/22 08/13/22  Ivonne Andrew, NP  losartan (COZAAR) 50 MG tablet Take 1 tablet (50 mg total) by mouth daily. 08/02/22   Calvert Cantor, MD  metFORMIN (GLUCOPHAGE) 500 MG tablet Take 1 tablet (500 mg total) by mouth 2 (two) times daily with a meal. 11/15/21   [name], [name], NP  metoprolol succinate (TOPROL-XL) 25 MG 24 hr tablet Take 0.5 tablets (12.5 mg total) by mouth daily. 08/01/22 09/03/23  Calvert Cantor, MD  Omega 3 1000 MG CAPS Take 1,000 mg by mouth daily.    [provider]  oxyCODONE-acetaminophen (PERCOCET) 5-325 MG tablet Take 1 tablet by mouth every 4 (four) hours as  needed for severe pain. 08/01/22   Calvert Cantor, MD  pantoprazole (PROTONIX) 40 MG tablet Take 1 tablet (40 mg total) by mouth daily. 08/01/22   Calvert Cantor, MD  tamsulosin (FLOMAX) 0.4 MG CAPS capsule Take 1 capsule (0.4 mg total) by mouth daily after supper. 08/01/22   Calvert Cantor, MD      Allergies    Beef-derived drug products, Shellfish allergy, Egg-derived products, and Other    Review of Systems   Review of Systems  Gastrointestinal:  Positive for abdominal pain.    Physical Exam Updated Vital Signs BP (!) 88/62   Pulse 82   Temp 98.1 F (36.7 C) (Oral)   Resp 14   SpO2 97%  Physical Exam Vitals and nursing note reviewed.  Constitutional:      Appearance:  He is well-developed.     Comments: Chronically ill-appearing.  Cachectic.  HENT:     Head: Normocephalic and atraumatic.  Eyes:     Pupils: Pupils are equal, round, and reactive to light.  Neck:     Vascular: No JVD.  Cardiovascular:     Rate and Rhythm: Normal rate and regular rhythm.     Heart sounds: No murmur heard.    No friction rub. No gallop.  Pulmonary:     Effort: No respiratory distress.     Breath sounds: No wheezing.  Abdominal:     General: There is no distension.     Tenderness: There is no abdominal tenderness. There is no guarding or rebound.  Musculoskeletal:        General: Normal range of motion.     Cervical back: Normal range of motion and neck supple.  Skin:    Coloration: Skin is not pale.     Findings: No rash.  Neurological:     Mental Status: He is alert and oriented to person, place, and time.  Psychiatric:        Behavior: Behavior normal.     ED Results / Procedures / Treatments   Labs (all labs ordered are listed, but only abnormal results are displayed) Labs Reviewed  COMPREHENSIVE METABOLIC PANEL - Abnormal; Notable for the following components:      Result Value   Sodium 132 (*)    Chloride 97 (*)    Glucose, Bld 135 (*)    Calcium 8.6 (*)    Albumin 2.1 (*)    All other components within normal limits  CBC WITH DIFFERENTIAL/PLATELET - Abnormal; Notable for the following components:   WBC 13.8 (*)    RBC 4.08 (*)    Hemoglobin 9.4 (*)    HCT 30.1 (*)    MCV 73.8 (*)    MCH 23.0 (*)    RDW 17.3 (*)    Platelets 464 (*)    Neutro Abs 8.9 (*)    Monocytes Absolute 1.1 (*)    Abs Immature Granulocytes 0.10 (*)    All other components within normal limits  URINALYSIS, W/ REFLEX TO CULTURE (INFECTION SUSPECTED) - Abnormal; Notable for the following components:   Color, Urine   (*)    Value: PATIENT IDENTIFICATION ERROR. PLEASE DISREGARD RESULTS. ACCOUNT WILL BE CREDITED.   All other components within normal limits  I-STAT CG4  LACTIC ACID, ED - Abnormal; Notable for the following components:   Lactic Acid, Venous 2.2 (*)    All other components within normal limits  I-STAT CG4 LACTIC ACID, ED - Abnormal; Notable for the following components:   Lactic Acid, Venous 2.0 (*)  All other components within normal limits  CULTURE, BLOOD (ROUTINE X 2)  CULTURE, BLOOD (ROUTINE X 2)  PROTIME-INR  APTT  LIPASE, BLOOD  URINALYSIS, W/ REFLEX TO CULTURE (INFECTION SUSPECTED)    EKG EKG Interpretation Date/Time:  Thursday September 03 2023 11:12:29 EST Ventricular Rate:  72 PR Interval:  137 QRS Duration:  106 QT Interval:  396 QTC Calculation: 440 R Axis:   57  Text Interpretation: Sinus rhythm ST elevation, consider inferior injury No significant change since last tracing Confirmed by Melene Plan [phone]) on 09/03/2023 11:21:13 AM  Radiology CT ABDOMEN PELVIS W CONTRAST Result Date: 09/03/2023 CLINICAL DATA:  Abdominal pain, acute, nonlocalized EXAM: CT ABDOMEN AND PELVIS WITH CONTRAST TECHNIQUE: Multidetector CT imaging of the abdomen and pelvis was performed using the standard protocol following bolus administration of intravenous contrast. RADIATION DOSE REDUCTION: This exam was performed according to the departmental dose-optimization program which includes automated exposure control, adjustment of the mA and/or kV according to patient size and/or use of iterative reconstruction technique. CONTRAST:  OMNIPAQUE IOHEXOL 300 MG/ML  SOLN COMPARISON:  CT scan abdomen and pelvis from 06/17/2022. FINDINGS: Lower chest: The lung bases are clear. No pleural effusion. The heart is normal in size. No pericardial effusion. Hepatobiliary: The liver is normal in size. Non-cirrhotic configuration. No suspicious mass. These is mild diffuse hepatic steatosis. No intrahepatic or extrahepatic bile duct dilation. No calcified gallstones. Normal gallbladder wall thickness. No pericholecystic inflammatory changes. Pancreas:  Unremarkable. No pancreatic ductal dilatation or surrounding inflammatory changes. Spleen: Within normal limits. There is a 5 mm hypoattenuating focus in the inferior portion of the spleen, which is too small to adequately characterize. Adrenals/Urinary Tract: Adrenal glands are unremarkable. No suspicious renal mass seen. 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 Bilateral kidneys exhibit extrarenal pelvis. However, no hydronephrosis or hydroureter. Urinary bladder is partially distended despite Foley catheter. There is mild circumferential wall thickening and mucosal hyperattenuation, which can be seen with cystitis. Correlate clinically and with urinalysis. No focal bladder mass, calculi or perivesical fat stranding. Stomach/Bowel: No disproportionate dilation of the small or large bowel loops. No evidence of abnormal bowel wall thickening or inflammatory changes. The appendix is unremarkable. Note is made of moderate stool burden. Vascular/Lymphatic: No ascites or pneumoperitoneum. No abdominal or pelvic lymphadenopathy, by size criteria. No aneurysmal dilation of the major abdominal arteries. There are mild peripheral atherosclerotic vascular calcifications of the aorta and its major branches. Reproductive: Normal size prostate. Symmetric seminal vesicles. Other: The visualized soft tissues and abdominal wall are unremarkable. Musculoskeletal: No suspicious osseous lesions. There are mild - moderate multilevel degenerative changes in the visualized spine. 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 IMPRESSION: *There is mild circumferential wall thickening and mucosal hyperattenuation of the urinary bladder, which can be seen with cystitis. Correlate clinically  and with urinalysis. *No other acute inflammatory process identified within the abdomen or pelvis. *Multiple other nonacute observations, as described above. Electronically Signed   By: Jules Schick M.D.   On: 09/03/2023 16:01   DG Chest Port 1 View Result Date: 09/03/2023 CLINICAL DATA:  Questionable sepsis - evaluate for abnormality. Headache, abdominal and back pain. EXAM: PORTABLE CHEST 1 VIEW COMPARISON:  06/25/2014. FINDINGS: Bilateral lung fields are clear. Bilateral costophrenic angles are clear. Stable cardio-mediastinal silhouette. No acute osseous  abnormalities. The soft tissues are within normal limits. IMPRESSION: No active disease. Electronically Signed   By: Jules Schick M.D.   On: 09/03/2023 13:25    Procedures BLADDER CATHETERIZATION  Date/Time: 09/03/2023 11:46 AM  Performed by: Melene Plan, DO Authorized by: Melene Plan, DO   Consent:    Consent obtained:  Verbal   Consent given by:  Patient   Risks, benefits, and alternatives were discussed: yes     Risks discussed:  False passage, infection, urethral injury, incomplete procedure and pain   Alternatives discussed:  No treatment, delayed treatment and alternative treatment Universal protocol:    Procedure explained and questions answered to patient or proxy's satisfaction: yes     Patient identity confirmed:  Verbally with patient Pre-procedure details:    Procedure purpose:  Diagnostic   Preparation: Patient was prepped and draped in usual sterile fashion   Anesthesia:    Anesthesia method:  None Procedure details:    Provider performed due to:  Nurse unable to complete   Catheter insertion:  Indwelling   Catheter type:  Coude   Catheter size:  16 Fr   Bladder irrigation: no     Number of attempts:  1   Urine characteristics:  Clear Post-procedure details:    Procedure completion:  Tolerated well, no immediate complications .Critical Care  Performed by: Melene Plan, DO Authorized by: Melene Plan, DO    Critical care provider statement:    Critical care time (minutes):  80   Critical care time was exclusive of:  Separately billable procedures and treating other patients   Critical care was time spent personally by me on the following activities:  Development of treatment plan with patient or surrogate, discussions with consultants, evaluation of patient's response to treatment, examination of patient, ordering and review of laboratory studies, ordering and review of radiographic studies, ordering and performing treatments and interventions, pulse oximetry, re-evaluation of patient's condition and review of old charts   Care discussed with: admitting provider       Medications Ordered in ED Medications  cefTRIAXone (ROCEPHIN) 2 g in sodium chloride 0.9 % 100 mL IVPB (2 g Intravenous New Bag/Given 09/03/23 1615)  sodium chloride 0.9 % bolus 1,000 mL (0 mLs Intravenous Stopped 09/03/23 1235)  sodium chloride 0.9 % bolus 1,000 mL (0 mLs Intravenous Stopped 09/03/23 1426)  iohexol (OMNIPAQUE) 300 MG/ML solution 100 mL (100 mLs Intravenous Contrast Given 09/03/23 1441)  sodium chloride 0.9 % bolus 1,000 mL (1,000 mLs Intravenous New Bag/Given 09/03/23 1549)    ED Course/ Medical Decision Making/ A&P                                 Medical Decision Making Amount and/or Complexity of Data Reviewed Labs: ordered. Radiology: ordered.  Risk Prescription drug management.   57 yo M with a chief complaint of a low blood pressure.  This was noted at his doctor's office today.  It sounds like he was in his normal state of health and then felt unwell while he was there.  Perhaps this is a presyncopal event.  Blood pressures are low on arrival here.  Bolus of IV fluids blood work.  Reassess.  Patient's blood work is resulted without significant abnormality.  His lactate is mildly elevated at 2.2.  UA without obvious infection.  His renal function appears to be at baseline.  Sodium and chloride are  slightly low.  Mild leukocytosis.  INR and PTT are normal.  Chest x-ray independently interpreted by me without focal infiltrate or pneumothorax.  Despite 2 L of IV fluids the patient's blood pressure remained soft.  On my record review the patient's blood pressures seem somewhat variable.  I suspect based on his body habitus that they likely are low but he does have documentation of blood pressures in the 140s and clinic visits.  Will obtain a CT scan of the abdomen pelvis.  CT of the abdomen pelvis is concerning for a urinary tract infection.  Will start on Rocephin.  Plan to admit to medicine.  I discussed the UA results with the nurse, told me that unfortunately they were sent with the wrong patient demographics and so it was canceled.  Will send a UA.  The patients results and plan were reviewed and discussed.   Any x-rays performed were independently reviewed by myself.   Differential diagnosis were considered with the presenting HPI.  Medications  cefTRIAXone (ROCEPHIN) 2 g in sodium chloride 0.9 % 100 mL IVPB (2 g Intravenous New Bag/Given 09/03/23 1615)  sodium chloride 0.9 % bolus 1,000 mL (0 mLs Intravenous Stopped 09/03/23 1235)  sodium chloride 0.9 % bolus 1,000 mL (0 mLs Intravenous Stopped 09/03/23 1426)  iohexol (OMNIPAQUE) 300 MG/ML solution 100 mL (100 mLs Intravenous Contrast Given 09/03/23 1441)  sodium chloride 0.9 % bolus 1,000 mL (1,000 mLs Intravenous New Bag/Given 09/03/23 1549)    Vitals:   09/03/23 1229 09/03/23 1347 09/03/23 1520 09/03/23 1537  BP: (!) 87/62 (!) 89/67 (!) 88/62   Pulse: 81 81 84 82  Resp: 11 12 13 14   Temp:    98.1 F (36.7 C)  TempSrc:    Oral  SpO2: 98% 99% 98% 97%    Final diagnoses:  Lower urinary tract infectious disease  Hypotension due to hypovolemia    Admission/ observation were discussed with the admitting physician, patient and/or family and they are comfortable with the plan.          Final Clinical Impression(s)  / ED Diagnoses Final diagnoses:  Lower urinary tract infectious disease  Hypotension due to hypovolemia    Rx / DC Orders ED Discharge Orders     None         Melene Plan, DO 09/03/23 1630    Melene Plan, DO 09/03/23 1638

## 2023-09-03 NOTE — ED Triage Notes (Signed)
Pt was seen at the pt center today when it was noted that the pts blood pressure was 70's/50's. Pt does endorse headaches, and abd pain.

## 2023-09-03 NOTE — H&P (Signed)
History and Physical    Patient: Wayne Stokes ONG:[id number] DOB: Feb 09, 1966 DOA: 09/03/2023 DOS: the patient was seen and examined on 09/03/2023 PCP: Kathrynn Speed, NP (Inactive)  Patient coming from: SNF>physician office>[facility] ER  Chief Complaint:  Chief Complaint  Patient presents with   Hypotension   Abdominal Pain   HPI: Wayne Stokes is a 57 y.o. male with medical history significant of hypertension, diabetes mellitus.  Patient is apparently resident of nursing home.  Patient apparently on treatment with metoprolol and losartan for his hypertension.  Although these medications are supposed to be held for blood pressure under 125, PCP noted today, that review of record shows that the patient has been getting the medication consistently without skipping.  Patient was feeling weak.  Patient was evaluated for routine appointment at PCP office today.  Where patient's blood pressure was found to be 87/64.  Patient sent to the ER.  Further history is also from secondary sources as at this time there is no interpreter available for [employer]   There is no report of patient having fever vomiting diarrhea any loss of consciousness.  Medical evaluation is sought Review of Systems: unable to review all systems due to the inability of the patient to answer questions. Past Medical History:  Diagnosis Date   Diabetes mellitus without complication (HCC)    Hypertension    Past Surgical History:  Procedure Laterality Date   IR KYPHO LUMBAR INC FX REDUCE BONE BX UNI/BIL CANNULATION INC/IMAGING  06/18/2022   IR LUMBAR DISC ASPIRATION W/IMG GUIDE  06/02/2022   Social History:  reports that he has never smoked. He has never used smokeless tobacco. He reports that he does not drink alcohol and does not use drugs.  Allergies  Allergen Reactions   Beef-Derived Drug Products Swelling   Shellfish Allergy Rash   Egg-Derived Products Swelling   Other Other (See Comments)    Egg plant , pizza - swelling     Family History  Family history unknown: Yes    Prior to Admission medications   Medication Sig Start Date End Date Taking? Authorizing Provider  acetaminophen (TYLENOL) 325 MG tablet Take 2 tablets (650 mg total) by mouth every 6 (six) hours as needed for mild pain, fever or headache. 08/01/22   Calvert Cantor, MD  ascorbic acid (VITAMIN C) 250 MG CHEW Chew 250 mg by mouth daily.    [provider]  atorvastatin (LIPITOR) 40 MG tablet Take 1 tablet (40 mg total) by mouth daily. 11/18/21   [name], [name], NP  bisacodyl (DULCOLAX) 10 MG suppository Place 1 suppository (10 mg total) rectally daily as needed for moderate constipation. 08/01/22   Calvert Cantor, MD  cholecalciferol (VITAMIN D3) 10 MCG/ML LIQD oral liquid Take 400 Units by mouth daily.    [provider]  cyclobenzaprine (FLEXERIL) 5 MG tablet Take 5 mg by mouth 3 (three) times daily as needed for muscle spasms.    [provider]  diclofenac (VOLTAREN) 75 MG EC tablet Take 1 tablet (75 mg total) by mouth 2 (two) times daily. 05/19/22   Elson Areas, PA-C  docusate sodium (COLACE) 100 MG capsule Take 100 mg by mouth 2 (two) times daily.    [provider]  donepezil (ARICEPT) 5 MG tablet Take 1 tablet (5 mg total) by mouth at bedtime. 12/12/21   Windell Norfolk, MD  feeding supplement (BOOST HIGH PROTEIN) LIQD Take 237 mLs by mouth 3 (three) times daily between meals. 08/01/22   Rizwan,  Saima, MD  ferrous sulfate 300 (60 Fe) MG/5ML syrup Take 5 mLs (300 mg total) by mouth daily with breakfast. 08/01/22   Calvert Cantor, MD  finasteride (PROSCAR) 5 MG tablet Take 1 tablet (5 mg total) by mouth daily. 08/01/22   Calvert Cantor, MD  gabapentin (NEURONTIN) 300 MG capsule Take 1 capsule (300 mg total) by mouth 3 (three) times daily. 05/15/22 08/13/22  Ivonne Andrew, NP  losartan (COZAAR) 50 MG tablet Take 1 tablet (50 mg total) by mouth daily. 08/02/22   Calvert Cantor, MD  metFORMIN (GLUCOPHAGE) 500  MG tablet Take 1 tablet (500 mg total) by mouth 2 (two) times daily with a meal. 11/15/21   [name], [name], NP  metoprolol succinate (TOPROL-XL) 25 MG 24 hr tablet Take 0.5 tablets (12.5 mg total) by mouth daily. 08/01/22 09/03/23  Calvert Cantor, MD  Omega 3 1000 MG CAPS Take 1,000 mg by mouth daily.    [provider]  oxyCODONE-acetaminophen (PERCOCET) 5-325 MG tablet Take 1 tablet by mouth every 4 (four) hours as needed for severe pain. 08/01/22   Calvert Cantor, MD  pantoprazole (PROTONIX) 40 MG tablet Take 1 tablet (40 mg total) by mouth daily. 08/01/22   Calvert Cantor, MD  tamsulosin (FLOMAX) 0.4 MG CAPS capsule Take 1 capsule (0.4 mg total) by mouth daily after supper. 08/01/22   Calvert Cantor, MD    Physical Exam: Vitals:   09/03/23 1520 09/03/23 1537 09/03/23 1700 09/03/23 1750  BP: (!) 88/62  98/62 93/65  Pulse: 84 82 83 83  Resp: 13 14 13 13   Temp:  98.1 F (36.7 C)  97.6 F (36.4 C)  TempSrc:  Oral  Oral  SpO2: 98% 97% 98% 98%   General: Patient is alert and awake and attempts to interact with signs and a couple of words of English.  Appears to be no immediate distress, however seem to have some pain in the left hip area during maneuvers for exam. Respiratory exam: Bibasilar crackles were heard Cardiovascular exam S1-S2 normal no jugular venous distention Abdomen soft nontender Extremities warm without edema no focal motor deficit.  Patient actually has good function of bilateral lower extremities although is complaining of some pain in the left hip area. Data Reviewed:  Labs on Admission:  Results for orders placed or performed during the hospital encounter of 09/03/23 (from the past 24 hours)  Comprehensive metabolic panel     Status: Abnormal   Collection Time: 09/03/23 11:20 AM  Result Value Ref Range   Sodium 132 (L) 135 - 145 mmol/L   Potassium 4.3 3.5 - 5.1 mmol/L   Chloride 97 (L) 98 - 111 mmol/L   CO2 25 22 - 32 mmol/L   Glucose, Bld 135 (H) 70 - 99  mg/dL   BUN 13 6 - 20 mg/dL   Creatinine, Ser 1.61 0.61 - 1.24 mg/dL   Calcium 8.6 (L) 8.9 - 10.3 mg/dL   Total Protein 7.8 6.5 - 8.1 g/dL   Albumin 2.1 (L) 3.5 - 5.0 g/dL   AST 17 15 - 41 U/L   ALT <5 0 - 44 U/L   Alkaline Phosphatase 68 38 - 126 U/L   Total Bilirubin 0.4 <1.2 mg/dL   GFR, Estimated >09 >60 mL/min   Anion gap 10 5 - 15  CBC with Differential     Status: Abnormal   Collection Time: 09/03/23 11:20 AM  Result Value Ref Range   WBC 13.8 (H) 4.0 - 10.5 K/uL   RBC  4.08 (L) 4.22 - 5.81 MIL/uL   Hemoglobin 9.4 (L) 13.0 - 17.0 g/dL   HCT 43.3 (L) 29.5 - 18.8 %   MCV 73.8 (L) 80.0 - 100.0 fL   MCH 23.0 (L) 26.0 - 34.0 pg   MCHC 31.2 30.0 - 36.0 g/dL   RDW 41.6 (H) 60.6 - 30.1 %   Platelets 464 (H) 150 - 400 K/uL   nRBC 0.0 0.0 - 0.2 %   Neutrophils Relative % 64 %   Neutro Abs 8.9 (H) 1.7 - 7.7 K/uL   Lymphocytes Relative 23 %   Lymphs Abs 3.1 0.7 - 4.0 K/uL   Monocytes Relative 8 %   Monocytes Absolute 1.1 (H) 0.1 - 1.0 K/uL   Eosinophils Relative 3 %   Eosinophils Absolute 0.5 0.0 - 0.5 K/uL   Basophils Relative 1 %   Basophils Absolute 0.1 0.0 - 0.1 K/uL   Immature Granulocytes 1 %   Abs Immature Granulocytes 0.10 (H) 0.00 - 0.07 K/uL  Protime-INR     Status: None   Collection Time: 09/03/23 11:20 AM  Result Value Ref Range   Prothrombin Time 14.7 11.4 - 15.2 seconds   INR 1.1 0.8 - 1.2  APTT     Status: None   Collection Time: 09/03/23 11:20 AM  Result Value Ref Range   aPTT 31 24 - 36 seconds  Lipase, blood     Status: None   Collection Time: 09/03/23 11:20 AM  Result Value Ref Range   Lipase 27 11 - 51 U/L  I-Stat Lactic Acid, ED     Status: Abnormal   Collection Time: 09/03/23 11:39 AM  Result Value Ref Range   Lactic Acid, Venous 2.2 (HH) 0.5 - 1.9 mmol/L  Blood Culture (routine x 2)     Status: None (Preliminary result)   Collection Time: 09/03/23 11:47 AM   Specimen: BLOOD RIGHT FOREARM  Result Value Ref Range   Specimen Description       BLOOD RIGHT FOREARM Performed at [facility], [address], [place], [place] [address]    Special Requests      BOTTLES DRAWN AEROBIC AND ANAEROBIC Blood Culture results may not be optimal due to an inadequate volume of blood received in culture bottles Performed at [facility], [address] [place], [place] [address]    Culture PENDING    Report Status PENDING   Urinalysis, w/ Reflex to Culture (Infection Suspected) -Urine, Catheterized; Indwelling urinary catheter     Status: Abnormal   Collection Time: 09/03/23 12:26 PM  Result Value Ref Range   Specimen Source      PATIENT IDENTIFICATION ERROR. PLEASE DISREGARD RESULTS. ACCOUNT WILL BE CREDITED.   Color, Urine (A) YELLOW    PATIENT IDENTIFICATION ERROR. PLEASE DISREGARD RESULTS. ACCOUNT WILL BE CREDITED.   APPearance  CLEAR    PATIENT IDENTIFICATION ERROR. PLEASE DISREGARD RESULTS. ACCOUNT WILL BE CREDITED.   Specific Gravity, Urine  1.005 - 1.030    PATIENT IDENTIFICATION ERROR. PLEASE DISREGARD RESULTS. ACCOUNT WILL BE CREDITED.   pH  5.0 - 8.0    PATIENT IDENTIFICATION ERROR. PLEASE DISREGARD RESULTS. ACCOUNT WILL BE CREDITED.   Glucose, UA  NEGATIVE mg/dL    PATIENT IDENTIFICATION ERROR. PLEASE DISREGARD RESULTS. ACCOUNT WILL BE CREDITED.   Hgb urine dipstick  NEGATIVE    PATIENT IDENTIFICATION ERROR. PLEASE DISREGARD RESULTS. ACCOUNT WILL BE CREDITED.   Bilirubin Urine  NEGATIVE    PATIENT IDENTIFICATION ERROR. PLEASE DISREGARD RESULTS. ACCOUNT WILL  BE CREDITED.   Ketones, ur  NEGATIVE mg/dL    PATIENT IDENTIFICATION ERROR. PLEASE DISREGARD RESULTS. ACCOUNT WILL BE CREDITED.   Protein, ur  NEGATIVE mg/dL    PATIENT IDENTIFICATION ERROR. PLEASE DISREGARD RESULTS. ACCOUNT WILL BE CREDITED.   Nitrite  NEGATIVE    PATIENT IDENTIFICATION ERROR. PLEASE DISREGARD RESULTS. ACCOUNT WILL BE CREDITED.   Leukocytes,Ua  NEGATIVE    PATIENT IDENTIFICATION ERROR. PLEASE DISREGARD RESULTS. ACCOUNT WILL BE  CREDITED.   RBC / HPF  0 - 5 RBC/hpf    PATIENT IDENTIFICATION ERROR. PLEASE DISREGARD RESULTS. ACCOUNT WILL BE CREDITED.   WBC, UA  0 - 5 WBC/hpf    PATIENT IDENTIFICATION ERROR. PLEASE DISREGARD RESULTS. ACCOUNT WILL BE CREDITED.   Bacteria, UA  NONE SEEN    PATIENT IDENTIFICATION ERROR. PLEASE DISREGARD RESULTS. ACCOUNT WILL BE CREDITED.   Squamous Epithelial / HPF  0 - 5 /HPF    PATIENT IDENTIFICATION ERROR. PLEASE DISREGARD RESULTS. ACCOUNT WILL BE CREDITED.   Mucus      PATIENT IDENTIFICATION ERROR. PLEASE DISREGARD RESULTS. ACCOUNT WILL BE CREDITED.   Urine-Other      PATIENT IDENTIFICATION ERROR. PLEASE DISREGARD RESULTS. ACCOUNT WILL BE CREDITED.  I-Stat Lactic Acid, ED     Status: Abnormal   Collection Time: 09/03/23  2:36 PM  Result Value Ref Range   Lactic Acid, Venous 2.0 (HH) 0.5 - 1.9 mmol/L  Urinalysis, w/ Reflex to Culture (Infection Suspected) -Urine, Catheterized; Indwelling urinary catheter     Status: Abnormal   Collection Time: 09/03/23  5:14 PM  Result Value Ref Range   Specimen Source URINE, CATHETERIZED    Color, Urine STRAW (A) YELLOW   APPearance CLEAR CLEAR   Specific Gravity, Urine 1.029 1.005 - 1.030   pH 6.0 5.0 - 8.0   Glucose, UA NEGATIVE NEGATIVE mg/dL   Hgb urine dipstick NEGATIVE NEGATIVE   Bilirubin Urine NEGATIVE NEGATIVE   Ketones, ur NEGATIVE NEGATIVE mg/dL   Protein, ur NEGATIVE NEGATIVE mg/dL   Nitrite NEGATIVE NEGATIVE   Leukocytes,Ua MODERATE (A) NEGATIVE   RBC / HPF 6-10 0 - 5 RBC/hpf   WBC, UA 21-50 0 - 5 WBC/hpf   Bacteria, UA RARE (A) NONE SEEN   Squamous Epithelial / HPF 0-5 0 - 5 /HPF   Mucus PRESENT    Hyaline Casts, UA PRESENT   Troponin I (High Sensitivity)     Status: None   Collection Time: 09/03/23  5:19 PM  Result Value Ref Range   Troponin I (High Sensitivity) <2 <18 ng/L  TSH     Status: Abnormal   Collection Time: 09/03/23  5:19 PM  Result Value Ref Range   TSH 0.348 (L) 0.350 - 4.500 uIU/mL  D-dimer,  quantitative     Status: Abnormal   Collection Time: 09/03/23  5:19 PM  Result Value Ref Range   D-Dimer, Quant 3.91 (H) 0.00 - 0.50 ug/mL-FEU   Basic Metabolic Panel: Recent Labs  Lab 09/03/23 1120  NA 132*  K 4.3  CL 97*  CO2 25  GLUCOSE 135*  BUN 13  CREATININE 0.61  CALCIUM 8.6*   Liver Function Tests: Recent Labs  Lab 09/03/23 1120  AST 17  ALT <5  ALKPHOS 68  BILITOT 0.4  PROT 7.8  ALBUMIN 2.1*   Recent Labs  Lab 09/03/23 1120  LIPASE 27   No results for input(s): "AMMONIA" in the last 168 hours. CBC: Recent Labs  Lab 09/03/23 1120  WBC 13.8*  NEUTROABS 8.9*  HGB 9.4*  HCT 30.1*  MCV 73.8*  PLT 464*   Cardiac Enzymes: Recent Labs  Lab 09/03/23 1719  TROPONINIHS <2    BNP (last 3 results) No results for input(s): "PROBNP" in the last 8760 hours. CBG: No results for input(s): "GLUCAP" in the last 168 hours.  Radiological Exams on Admission:  CT ABDOMEN PELVIS W CONTRAST Result Date: 09/03/2023 CLINICAL DATA:  Abdominal pain, acute, nonlocalized EXAM: CT ABDOMEN AND PELVIS WITH CONTRAST TECHNIQUE: Multidetector CT imaging of the abdomen and pelvis was performed using the standard protocol following bolus administration of intravenous contrast. RADIATION DOSE REDUCTION: This exam was performed according to the departmental dose-optimization program which includes automated exposure control, adjustment of the mA and/or kV according to patient size and/or use of iterative reconstruction technique. CONTRAST:  OMNIPAQUE IOHEXOL 300 MG/ML  SOLN COMPARISON:  CT scan abdomen and pelvis from 06/17/2022. FINDINGS: Lower chest: The lung bases are clear. No pleural effusion. The heart is normal in size. No pericardial effusion. Hepatobiliary: The liver is normal in size. Non-cirrhotic configuration. No suspicious mass. These is mild diffuse hepatic steatosis. No intrahepatic or extrahepatic bile duct dilation. No calcified gallstones. Normal gallbladder wall  thickness. No pericholecystic inflammatory changes. Pancreas: Unremarkable. No pancreatic ductal dilatation or surrounding inflammatory changes. Spleen: Within normal limits. There is a 5 mm hypoattenuating focus in the inferior portion of the spleen, which is too small to adequately characterize. Adrenals/Urinary Tract: Adrenal glands are unremarkable. No suspicious renal mass seen. 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 Bilateral kidneys exhibit extrarenal pelvis. However, no hydronephrosis or hydroureter. Urinary bladder is partially distended despite Foley catheter. There is mild circumferential wall thickening and mucosal hyperattenuation, which can be seen with cystitis. Correlate clinically and with urinalysis. No focal bladder mass, calculi or perivesical fat stranding. Stomach/Bowel: No disproportionate dilation of the small or large bowel loops. No evidence of abnormal bowel wall thickening or inflammatory changes. The appendix is unremarkable. Note is made of moderate stool burden. Vascular/Lymphatic: No ascites or pneumoperitoneum. No abdominal or pelvic lymphadenopathy, by size criteria. No aneurysmal dilation of the major abdominal arteries. There are mild peripheral atherosclerotic vascular calcifications of the aorta and its major branches. Reproductive: Normal size prostate. Symmetric seminal vesicles. Other: The visualized soft tissues and abdominal wall are unremarkable. Musculoskeletal: No suspicious osseous lesions. There are mild - moderate multilevel degenerative changes in the visualized spine. 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 IMPRESSION: *There is mild circumferential wall thickening and mucosal hyperattenuation of the urinary  bladder, which can be seen with cystitis. Correlate clinically and with urinalysis. *No other acute inflammatory process identified within the abdomen or pelvis. *Multiple other nonacute observations, as described above. Electronically Signed   By: Jules Schick M.D.   On: 09/03/2023 16:01   DG Chest Port 1 View Result Date: 09/03/2023 CLINICAL DATA:  Questionable sepsis - evaluate for abnormality. Headache, abdominal and back pain. EXAM: PORTABLE CHEST 1 VIEW COMPARISON:  06/25/2014. FINDINGS: Bilateral lung fields are clear. Bilateral costophrenic angles are clear. Stable cardio-mediastinal silhouette. No acute osseous abnormalities. The soft tissues are within normal limits. IMPRESSION: No active disease. Electronically Signed   By: Jules Schick M.D.   On: 09/03/2023 13:25    EKG: Independently reviewed. Artifact present. NSR   Assessment and Plan: * Hypotension This  is the principal reason for presentation, is largely incidentally found at doctor's office.  Patient has been getting metoprolol and losartan at his nursing home without fail.  We will obviously hold these.  Troponin negative, D-dimer is elevated we will start with lower extremity Doppler to rule out venous thromboembolism.  Check echo, TSH is minimally below normal limits.  I think that is nonspecific.  We will place the patient on progressive care.  Cortisol and ACTH are pending.  I will start the patient on midodrine till blood pressure improves. Patient looking non toxic.  Patient received 3 L of normal saline in the ER.  Auscultated some crackles in the lung at this time.  Will hold off on further IV fluids.  Patient not hypoxic.  Echo ordered for morning.  Given patient's mentation, at this time patient is not felt to be in shock.  Also his creatinine is normal.  We will monitor patient's urine output.  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  This was replaced in the ER.  Type 2 diabetes mellitus without  complication, without long-term current use of insulin (HCC) Not on insulin at home.  Monitor with sliding scale for 24 hours, plan to DC sliding scale after that.  Cystitis Patient is reporting some pelvic area/left hip area discomfort.  Finding of bladder wall thickening on CAT scan.  Patient has been having some leukocytosis although no fever.  Patient has been started on ceftriaxone, urine cultures pending.  We will continue with same. Left femur xray ordered due to concern of pain      Advance Care Planning:   Code Status: Prior   Consults: none at this time.  Family Communication: per patient.  Severity of Illness: The appropriate patient status for this patient is INPATIENT. Inpatient status is judged to be reasonable and necessary in order to provide the required intensity of service to ensure the patient's safety. The patient's presenting symptoms, physical exam findings, and initial radiographic and laboratory data in the context of their chronic comorbidities is felt to place them at high risk for further clinical deterioration. Furthermore, it is not anticipated that the patient will be medically stable for discharge from the hospital within 2 midnights of admission.   * I certify that at the point of admission it is my clinical judgment that the patient will require inpatient hospital care spanning beyond 2 midnights from the point of admission due to high intensity of service, high risk for further deterioration and high frequency of surveillance required.*  Author: Nolberto Hanlon, MD 09/03/2023 6:27 PM  For on call review [email or web address].

## 2023-09-03 NOTE — Assessment & Plan Note (Addendum)
Patient is reporting some pelvic area/left hip area discomfort.  Finding of bladder wall thickening on CAT scan.  Patient has been having some leukocytosis although no fever.  Patient has been started on ceftriaxone, urine cultures pending.  We will continue with same. Left femur xray ordered due to concern of pain

## 2023-09-03 NOTE — ED Notes (Signed)
Changed foley cath due to UA needed and cath with color change. Upon removal noted foreskin had not been retracted and cleaned. Upon cleaning bleeding noted. Dr Adela Lank aware.

## 2023-09-03 NOTE — Assessment & Plan Note (Signed)
Not on insulin at home.  Monitor with sliding scale for 24 hours, plan to DC sliding scale after that.

## 2023-09-04 ENCOUNTER — Inpatient Hospital Stay (HOSPITAL_COMMUNITY): Payer: Medicaid Other

## 2023-09-04 ENCOUNTER — Other Ambulatory Visit: Payer: Self-pay

## 2023-09-04 DIAGNOSIS — I422 Other hypertrophic cardiomyopathy: Secondary | ICD-10-CM

## 2023-09-04 DIAGNOSIS — I959 Hypotension, unspecified: Secondary | ICD-10-CM | POA: Diagnosis not present

## 2023-09-04 LAB — URINE CULTURE: Culture: <10000 — AB

## 2023-09-04 LAB — BASIC METABOLIC PANEL
Anion gap: 7 (ref 5–15)
BUN: 10 mg/dL (ref 6–20)
CO2: 24 mmol/L (ref 22–32)
Calcium: 8.3 mg/dL — ABNORMAL LOW (ref 8.9–10.3)
Chloride: 107 mmol/L (ref 98–111)
Creatinine, Ser: 0.58 mg/dL — ABNORMAL LOW (ref 0.61–1.24)
GFR, Estimated: >60 mL/min (ref >60)
Glucose, Bld: 90 mg/dL (ref 70–99)
Potassium: 3.6 mmol/L (ref 3.5–5.1)
Sodium: 138 mmol/L (ref 135–145)

## 2023-09-04 LAB — RETICULOCYTES
Immature Retic Fract: 26 % — ABNORMAL HIGH (ref 2.3–15.9)
RBC.: 3.32 MIL/uL — ABNORMAL LOW (ref 4.22–5.81)
Retic Count, Absolute: 43.8 10*3/uL (ref 19.0–186.0)
Retic Ct Pct: 1.3 % (ref 0.4–3.1)

## 2023-09-04 LAB — ECHOCARDIOGRAM COMPLETE
AR max vel: 1.81 cm2
AV Area VTI: 2.25 cm2
AV Area mean vel: 2.08 cm2
AV Mean grad: 3 mm[Hg]
AV Peak grad: 7.6 mm[Hg]
Ao pk vel: 1.38 m/s
Area-P 1/2: 4.06 cm2
Height: 60 in
P 1/2 time: 707 ms
S' Lateral: 2 cm
Weight: 1940.05 [oz_av]

## 2023-09-04 LAB — GLUCOSE, CAPILLARY
Glucose-Capillary: 91 mg/dL (ref 70–99)
Glucose-Capillary: 109 mg/dL — ABNORMAL HIGH (ref 70–99)
Glucose-Capillary: 125 mg/dL — ABNORMAL HIGH (ref 70–99)
Glucose-Capillary: 99 mg/dL (ref 70–99)

## 2023-09-04 LAB — FERRITIN: Ferritin: 118 ng/mL (ref 24–336)

## 2023-09-04 LAB — CBC
HCT: 23.8 % — ABNORMAL LOW (ref 39.0–52.0)
Hemoglobin: 7.6 g/dL — ABNORMAL LOW (ref 13.0–17.0)
MCH: 23.2 pg — ABNORMAL LOW (ref 26.0–34.0)
MCHC: 31.9 g/dL (ref 30.0–36.0)
MCV: 72.8 fL — ABNORMAL LOW (ref 80.0–100.0)
Platelets: 351 10*3/uL (ref 150–400)
RBC: 3.27 MIL/uL — ABNORMAL LOW (ref 4.22–5.81)
RDW: 17.3 % — ABNORMAL HIGH (ref 11.5–15.5)
WBC: 7 10*3/uL (ref 4.0–10.5)
nRBC: 0 % (ref 0.0–0.2)

## 2023-09-04 LAB — FOLATE: Folate: 20 ng/mL (ref >5.9)

## 2023-09-04 LAB — CORTISOL: Cortisol, Plasma: 14.2 ug/dL

## 2023-09-04 LAB — IRON AND TIBC
Iron: 36 ug/dL — ABNORMAL LOW (ref 45–182)
Saturation Ratios: 26 % (ref 17.9–39.5)
TIBC: 139 ug/dL — ABNORMAL LOW (ref 250–450)
UIBC: 103 ug/dL

## 2023-09-04 LAB — PROTIME-INR
INR: 1.1 (ref 0.8–1.2)
Prothrombin Time: 14.9 s (ref 11.4–15.2)

## 2023-09-04 LAB — CORTISOL-AM, BLOOD: Cortisol - AM: 14.2 ug/dL (ref 6.7–22.6)

## 2023-09-04 LAB — APTT: aPTT: 37 s — ABNORMAL HIGH (ref 24–36)

## 2023-09-04 LAB — VITAMIN B12: Vitamin B-12: 639 pg/mL (ref 180–914)

## 2023-09-04 MED ORDER — ATORVASTATIN CALCIUM 40 MG PO TABS
40.0000 mg | ORAL_TABLET | Freq: Every day | ORAL | Status: DC
  Administered 2023-09-04 – 2023-09-06 (×3): 40 mg via ORAL
  Filled 2023-09-04 (×3): qty 1

## 2023-09-04 MED ORDER — CYCLOBENZAPRINE HCL 5 MG PO TABS
5.0000 mg | ORAL_TABLET | Freq: Three times a day (TID) | ORAL | Status: DC | PRN
  Administered 2023-09-04: 5 mg via ORAL
  Filled 2023-09-04 (×2): qty 1

## 2023-09-04 MED ORDER — CHLORHEXIDINE GLUCONATE CLOTH 2 % EX PADS
6.0000 | MEDICATED_PAD | Freq: Every day | CUTANEOUS | Status: DC
  Administered 2023-09-04 – 2023-09-07 (×4): 6 via TOPICAL

## 2023-09-04 MED ORDER — IOHEXOL 350 MG/ML SOLN
75.0000 mL | Freq: Once | INTRAVENOUS | Status: AC | PRN
  Administered 2023-09-04: 75 mL via INTRAVENOUS

## 2023-09-04 MED ORDER — OXYCODONE-ACETAMINOPHEN 5-325 MG PO TABS
1.0000 | ORAL_TABLET | Freq: Four times a day (QID) | ORAL | Status: DC | PRN
  Administered 2023-09-04 – 2023-09-07 (×10): 1 via ORAL
  Filled 2023-09-04 (×11): qty 1

## 2023-09-04 MED ORDER — DONEPEZIL HCL 5 MG PO TABS
5.0000 mg | ORAL_TABLET | Freq: Every day | ORAL | Status: DC
  Administered 2023-09-04 – 2023-09-06 (×3): 5 mg via ORAL
  Filled 2023-09-04 (×3): qty 1

## 2023-09-04 MED ORDER — ORAL CARE MOUTH RINSE: 15.0000 mL | OROMUCOSAL | Status: DC | PRN

## 2023-09-04 MED ORDER — GABAPENTIN 300 MG PO CAPS
300.0000 mg | ORAL_CAPSULE | Freq: Three times a day (TID) | ORAL | Status: DC
  Administered 2023-09-04 – 2023-09-07 (×9): 300 mg via ORAL
  Filled 2023-09-04 (×9): qty 1

## 2023-09-04 NOTE — Progress Notes (Signed)
Orthostatics  09/04/23 1028 09/04/23 1030 09/04/23 1031  Vitals  BP 109/73 121/77 118/79  MAP (mmHg) 83 91 91  Patient Position (if appropriate) Lying Sitting Standing  Pulse Rate 74 86 82  ECG Heart Rate 76 83 84

## 2023-09-04 NOTE — Progress Notes (Signed)
PT Cancellation Note  Patient Details Name: Wayne Stokes MRN: [id number] DOB: [birth year]   Cancelled Treatment:    Reason Eval/Treat Not Completed: Patient at procedure or test/unavailable,  for CT angio to R/O PE. Will check back tomorrow when cleared.  Blanchard Kelch PT Acute Rehabilitation Services Office [phone] Weekend pager-[phone]   Rada Hay 09/04/2023, 2:27 PM

## 2023-09-04 NOTE — Progress Notes (Signed)
  Echocardiogram 2D Echocardiogram has been performed.  Wayne Stokes [facility] 09/04/2023, 3:32 PM

## 2023-09-04 NOTE — Patient Outreach (Signed)
  Care Management   Outreach Note  09/04/2023 Name: Balke Mettert MRN: [id number] DOB: 06-Aug-1966  A referral was place for VBCI for this patient. The patient does not qualify for VBCI population health services due to currently being hospitalized. Collaboration with hospital Liaison Elliot Cousin, RN and scheduling care guide. Referral to be closed.  Population Energy manager, Alto Denver reached out via instant messaging and in basket correspondence to [employer], LCSW, who is assigned to the patient on the inpatient side. Asked her to please follow up on referral needs expressed by the pcp in the referral note since the patient is currently inpatient. Left population health managers name and number for any questions and concerns.   Follow Up Plan:  No further follow up required: the patient is inpatient and will be followed up by the SW team  Alto Denver RN, MSN, CCM RN Care Manager  [facility]  Ambulatory Care Management  Direct Number: [phone]

## 2023-09-04 NOTE — Progress Notes (Signed)
MEWS Progress Note  Patient Details Name: Wayne Stokes MRN: [id number] DOB: January 05, 1966 Today's Date: 09/04/2023   MEWS Flowsheet Documentation:  Assess: MEWS Score Temp: 98 F (36.7 C) BP: 97/71 MAP (mmHg): 80 Pulse Rate: 76 ECG Heart Rate: 77 Resp: 13 Level of Consciousness: Alert SpO2: 98 % O2 Device: Room Air Assess: MEWS Score MEWS Temp: 0 MEWS Systolic: 1 MEWS Pulse: 0 MEWS RR: 1 MEWS LOC: 0 MEWS Score: 2 MEWS Score Color: Yellow Assess: SIRS CRITERIA SIRS Temperature : 0 SIRS Respirations : 0 SIRS Pulse: 0 SIRS WBC: 0 SIRS Score Sum : 0 SIRS Temperature : 0 SIRS Pulse: 0 SIRS Respirations : 0 SIRS WBC: 0 SIRS Score Sum : 0 Assess: if the MEWS score is Yellow or Red Were vital signs accurate and taken at a resting state?: Yes Does the patient meet 2 or more of the SIRS criteria?: No MEWS guidelines implemented : Yes, yellow Treat MEWS Interventions: Considered administering scheduled or prn medications/treatments as ordered Take Vital Signs Increase Vital Sign Frequency : Yellow: Q2hr x1, continue Q4hrs until patient remains green for 12hrs Escalate MEWS: Escalate: Yellow: Discuss with charge nurse and consider notifying provider and/or RRT Notify: Charge Nurse/RN Name of Charge Nurse/RN Notified: Lovie Macadamia, RN      Sudie Grumbling 09/04/2023, 5:15 AM

## 2023-09-04 NOTE — Progress Notes (Signed)
Reviewed patient's chart for admission. Patient noted to have hypotension with adventitious lung sounds - per MD note, would like to hold off on fluids d/t crackles. Raised concerns with admitting and ED staff about patient's appropriateness for Progressive. Awaiting NP review of chart for further action. Paramedic and ED charge nurse aware, 4W charge nurse aware, RR RN also notified.

## 2023-09-04 NOTE — Plan of Care (Signed)
  Problem: Fluid Volume: Goal: Ability to maintain a balanced intake and output will improve Outcome: Progressing   Problem: Nutritional: Goal: Maintenance of adequate nutrition will improve Outcome: Progressing   Problem: Skin Integrity: Goal: Risk for impaired skin integrity will decrease Outcome: Progressing   Problem: Tissue Perfusion: Goal: Adequacy of tissue perfusion will improve Outcome: Progressing   Problem: Clinical Measurements: Goal: Ability to maintain clinical measurements within normal limits will improve Outcome: Progressing   Problem: Elimination: Goal: Will not experience complications related to urinary retention Outcome: Progressing   Problem: Pain Management: Goal: General experience of comfort will improve Outcome: Progressing   Problem: Health Behavior/Discharge Planning: Goal: Ability to identify and utilize available resources and services will improve Outcome: Not Progressing

## 2023-09-04 NOTE — Progress Notes (Addendum)
PROGRESS NOTE  Wayne Stokes NWG:[id number] DOB: September 26, 1965   PCP: [name], NP (Inactive)  Patient is from: SNF.  Wheelchair dependent at baseline.  DOA: 09/03/2023 LOS: 1  Chief complaints Chief Complaint  Patient presents with   Hypotension   Abdominal Pain     Brief Narrative / Interim history: 57 year old M with PMH of DM-2, HTN, chronic Foley and wheelchair dependence sent to ED from PCP office due to hypotension and admitted for hypotension and possible UTI.  Patient was hypotensive to 87/64 at PCP office.  Slightly tachycardic to 106.  Afebrile.  In ED, hypotensive to 60/51.  Other vitals normal.  CMP with mild hyponatremia.  WBC 13.8.  Hgb 9.4.  Platelet 464.  Lactic acid 2.2.  Coag labs negative.  Troponin negative.  D-dimer elevated to 3.91.  UA with moderate LE and rare bacteria.  CT abdomen and pelvis with contrast showed mild circumferential wall thickening and mucosal hyperattenuation of the urinary bladder.  Blood and urine cultures obtained.  Resuscitated with IV fluid.  Started on IV ceftriaxone for possible UTI.  TSH low at 0.33.  Free T4 slightly elevated to 1.33.  Hypotension resolved.  Blood cultures NGTD.  Subjective: Seen and examined earlier this morning with the help of in person interpreter.  Patient reports suprapubic abdominal pain that he describes as sensation of having a bowel movements.  He denies nausea or vomiting.  He reports chronic Moody but denies dysuria.  He has Foley catheter.  Denies cardiopulmonary symptoms or GI symptoms other than suprapubic pain.  Objective: Vitals:   09/04/23 1028 09/04/23 1030 09/04/23 1031 09/04/23 1200  BP: 109/73 121/77 118/79 100/75  Pulse: 74 86 82   Resp:    19  Temp:      TempSrc:      SpO2:      Weight:      Height:        Examination:  GENERAL: No apparent distress.  Nontoxic. HEENT: MMM.  Vision and hearing grossly intact.  NECK: Supple.  No apparent JVD.  RESP:  No IWOB.  Fair aeration  bilaterally. CVS:  RRR. Heart sounds normal.  ABD/GI/GU: BS+. Abd soft.  Mild suprapubic tenderness.  Indwelling Foley. MSK/EXT:  Moves extremities. No apparent deformity. No edema.  SKIN: no apparent skin lesion or wound NEURO: Awake, alert and oriented appropriately.  No apparent focal neuro deficit. PSYCH: Calm. Normal affect.   Procedures:  None  Microbiology summarized: Blood cultures NGTD Urine culture pending  Assessment and plan: Hypotension/history of essential hypertension: Hypotensive to 60/51.  Hypotension likely iatrogenic from antihypertensive meds and hydration from poor p.o. intake.  Notable hemoconcentration on admission.  CT abdomen and pelvis suggested cystitis but UA not convincing.  However, he has Foley catheter and suprapubic tenderness.  Orthostatic vitals negative.  Cortisol within normal.  D-dimer is elevated.  Lower extremity venous Doppler negative for DVT. -Continue antibiotics for suspected UTI -Continue p.o. midodrine -Follow echocardiogram and ACTH -Continue holding home losartan and metoprolol. -CT angio chest to rule out PE given elevated D-dimer.  Suspected cystitis -Continue IV ceftriaxone pending urine culture  Microcytic anemia: Baseline Hgb 8-9.  Drop in Hgb likely dilutional from IV fluid.  Some blood-tinged stool per RN report.  He could have some hemorrhoids. Recent Labs    09/03/23 1120 09/03/23 1927 09/04/23 0822  HGB 9.4* 7.5* 7.6*  -Discontinue IV fluid -Continue monitoring -SCD for VTE prophylaxis   NIDDM-2: A1c 6.1%.  On metformin at home. Recent Labs  Lab 09/03/23 2152 09/04/23 0736 09/04/23 1159  GLUCAP 103* 91 109*  -Continue SSI-very sensitive  Elevated D-dimer: D-dimer 3.9.  Lower extremity venous Doppler negative.  Given hypotension and sedentary lifestyle, we have to rule out PE -CT angio chest to rule out PE.  Debility/physical deconditioning: Uses wheelchair at baseline but able to stand. -Supportive  care.  Language barrier -In person language interpreter utilized.  Primary hyperthyroidism?  TSH 0.33.  Free T4 slightly elevated to 1.33.  Not on Synthroid supplementation. -Needs repeat in 3 to 4 weeks.  Cognitive impairment? -Continue home Aricept  Chronic back pain -Resume home meds  BPH/chronic urinary tension: Has Foley catheter in place. -Continue holding Proscar and fenofibrate for now  Body mass index is 23.68 kg/m.           DVT prophylaxis:  Place and maintain sequential compression device Start: 09/04/23 1150 SCDs Start: 09/03/23 1842  Code Status: Full code Family Communication: None at bedside Level of care: Progressive Status is: Inpatient Remains inpatient appropriate because: Hypotension and suspected cystitis   Final disposition: SNF Consultants:  None  55 minutes with more than 50% spent in reviewing records, counseling patient/family and coordinating care.   Sch Meds:  Scheduled Meds:  atorvastatin  40 mg Oral Daily   Chlorhexidine Gluconate Cloth  6 each Topical Daily   donepezil  5 mg Oral QHS   gabapentin  300 mg Oral TID   insulin aspart  0-5 Units Subcutaneous QHS   insulin aspart  0-6 Units Subcutaneous TID WC   midodrine  10 mg Oral TID WC   pantoprazole  40 mg Oral Daily   sodium chloride flush  3 mL Intravenous Q12H   Continuous Infusions:  cefTRIAXone (ROCEPHIN)  IV 2 g (09/04/23 1047)   PRN Meds:.acetaminophen **OR** acetaminophen, cyclobenzaprine, mouth rinse, oxyCODONE-acetaminophen, polyethylene glycol  Antimicrobials: Anti-infectives (From admission, onward)    Start     Dose/Rate Route Frequency Ordered Stop   09/04/23 1000  cefTRIAXone (ROCEPHIN) 2 g in sodium chloride 0.9 % 100 mL IVPB        2 g 200 mL/hr over 30 Minutes Intravenous Every 24 hours 09/03/23 1835 09/07/23 0959   09/03/23 1615  cefTRIAXone (ROCEPHIN) 2 g in sodium chloride 0.9 % 100 mL IVPB        2 g 200 mL/hr over 30 Minutes Intravenous  Once  09/03/23 1606 09/03/23 1700        I have personally reviewed the following labs and images: CBC: Recent Labs  Lab 09/03/23 1120 09/03/23 1927 09/04/23 0822  WBC 13.8* 7.8 7.0  NEUTROABS 8.9*  --   --   HGB 9.4* 7.5* 7.6*  HCT 30.1* 24.8* 23.8*  MCV 73.8* 74.0* 72.8*  PLT 464* 369 351   BMP &GFR Recent Labs  Lab 09/03/23 1120 09/03/23 1927 09/04/23 0822  NA 132*  --  138  K 4.3  --  3.6  CL 97*  --  107  CO2 25  --  24  GLUCOSE 135*  --  90  BUN 13  --  10  CREATININE 0.61 0.43* 0.58*  CALCIUM 8.6*  --  8.3*   Estimated Creatinine Clearance: 71.2 mL/min (A) (by C-G formula based on SCr of 0.58 mg/dL (L)). Liver & Pancreas: Recent Labs  Lab 09/03/23 1120  AST 17  ALT <5  ALKPHOS 68  BILITOT 0.4  PROT 7.8  ALBUMIN 2.1*   Recent Labs  Lab 09/03/23 1120  LIPASE 27   No  results for input(s): "AMMONIA" in the last 168 hours. Diabetic: Recent Labs    09/03/23 1156  HGBA1C 6.1*   Recent Labs  Lab 09/03/23 2152 09/04/23 0736 09/04/23 1159  GLUCAP 103* 91 109*   Cardiac Enzymes: No results for input(s): "CKTOTAL", "CKMB", "CKMBINDEX", "TROPONINI" in the last 168 hours. No results for input(s): "PROBNP" in the last 8760 hours. Coagulation Profile: Recent Labs  Lab 09/03/23 1120 09/04/23 0822  INR 1.1 1.1   Thyroid Function Tests: Recent Labs    09/03/23 1719  TSH 0.348*  FREET4 1.33*   Lipid Profile: No results for input(s): "CHOL", "HDL", "LDLCALC", "TRIG", "CHOLHDL", "LDLDIRECT" in the last 72 hours. Anemia Panel: No results for input(s): "VITAMINB12", "FOLATE", "FERRITIN", "TIBC", "IRON", "RETICCTPCT" in the last 72 hours. Urine analysis:    Component Value Date/Time   COLORURINE STRAW (A) 09/03/2023 1714   APPEARANCEUR CLEAR 09/03/2023 1714   LABSPEC 1.029 09/03/2023 1714   PHURINE 6.0 09/03/2023 1714   GLUCOSEU NEGATIVE 09/03/2023 1714   HGBUR NEGATIVE 09/03/2023 1714   BILIRUBINUR NEGATIVE 09/03/2023 1714   BILIRUBINUR small  (A) 11/15/2021 1225   KETONESUR NEGATIVE 09/03/2023 1714   PROTEINUR NEGATIVE 09/03/2023 1714   UROBILINOGEN 0.2 11/15/2021 1225   NITRITE NEGATIVE 09/03/2023 1714   LEUKOCYTESUR MODERATE (A) 09/03/2023 1714   Sepsis Labs: Invalid input(s): "PROCALCITONIN", "LACTICIDVEN"  Microbiology: Recent Results (from the past 240 hours)  Blood Culture (routine x 2)     Status: None (Preliminary result)   Collection Time: 09/03/23 11:20 AM   Specimen: BLOOD  Result Value Ref Range Status   Specimen Description   Final    BLOOD LEFT ANTECUBITAL Performed at [facility], [address], [place], [place] [address]    Special Requests   Final    BOTTLES DRAWN AEROBIC AND ANAEROBIC Blood Culture results may not be optimal due to an inadequate volume of blood received in culture bottles Performed at [facility], [address] [place], [place] [address]    Culture   Final    NO GROWTH < 24 HOURS Performed at [facility], [address] [place], [place] [address]    Report Status PENDING  Incomplete  Blood Culture (routine x 2)     Status: None (Preliminary result)   Collection Time: 09/03/23 11:47 AM   Specimen: BLOOD RIGHT FOREARM  Result Value Ref Range Status   Specimen Description   Final    BLOOD RIGHT FOREARM Performed at [facility], [address] [place], [place] [address]    Special Requests   Final    BOTTLES DRAWN AEROBIC AND ANAEROBIC Blood Culture results may not be optimal due to an inadequate volume of blood received in culture bottles Performed at [facility], [address], [place], [place] [address]    Culture   Final    NO GROWTH < 24 HOURS Performed at [facility], [address] [place], [place] [address]    Report Status PENDING  Incomplete    Radiology Studies: VAS [place] LOWER EXTREMITY VENOUS (DVT) Result Date: 09/03/2023  Lower Venous DVT Study Patient Name:  Wayne Stokes    Date  of Exam:   09/03/2023 Medical Rec #: [id number]  Accession #:    [id number] Date of Birth: [birth year]   Patient Gender: M Patient Age:   55 years Exam Location:  [facility] Procedure:      VAS [place] LOWER EXTREMITY VENOUS (DVT) Referring  Phys: [facility] GOEL --------------------------------------------------------------------------------  Indications: Swelling, and Edema.  Comparison Study: No prior exam. Performing Technologist: Fernande Bras  Examination Guidelines: A complete evaluation includes B-mode imaging, spectral Doppler, color Doppler, and power Doppler as needed of all accessible portions of each vessel. Bilateral testing is considered an integral part of a complete examination. Limited examinations for reoccurring indications may be performed as noted. The reflux portion of the exam is performed with the patient in reverse Trendelenburg.  +---------+---------------+---------+-----------+----------+--------------+ RIGHT    CompressibilityPhasicitySpontaneityPropertiesThrombus Aging +---------+---------------+---------+-----------+----------+--------------+ CFV      Full           Yes      Yes                                 +---------+---------------+---------+-----------+----------+--------------+ SFJ      Full           Yes      Yes                                 +---------+---------------+---------+-----------+----------+--------------+ FV Prox  Full                                                        +---------+---------------+---------+-----------+----------+--------------+ FV Mid   Full                                                        +---------+---------------+---------+-----------+----------+--------------+ FV DistalFull                                                        +---------+---------------+---------+-----------+----------+--------------+ PFV      Full                                                         +---------+---------------+---------+-----------+----------+--------------+ POP      Full           Yes      Yes                                 +---------+---------------+---------+-----------+----------+--------------+ PTV      Full                                                        +---------+---------------+---------+-----------+----------+--------------+ PERO     Full                                                        +---------+---------------+---------+-----------+----------+--------------+   +---------+---------------+---------+-----------+----------+--------------+  LEFT     CompressibilityPhasicitySpontaneityPropertiesThrombus Aging +---------+---------------+---------+-----------+----------+--------------+ CFV      Full           Yes      Yes                                 +---------+---------------+---------+-----------+----------+--------------+ SFJ      Full           Yes      Yes                                 +---------+---------------+---------+-----------+----------+--------------+ FV Prox  Full                                                        +---------+---------------+---------+-----------+----------+--------------+ FV Mid   Full                                                        +---------+---------------+---------+-----------+----------+--------------+ FV DistalFull                                                        +---------+---------------+---------+-----------+----------+--------------+ PFV      Full                                                        +---------+---------------+---------+-----------+----------+--------------+ POP      Full           Yes      Yes                                 +---------+---------------+---------+-----------+----------+--------------+ PTV      Full                                                         +---------+---------------+---------+-----------+----------+--------------+ PERO     Full                                                        +---------+---------------+---------+-----------+----------+--------------+     Summary: BILATERAL: - No evidence of deep vein thrombosis seen in the lower extremities, bilaterally. -No evidence of popliteal cyst, bilaterally.   *See table(s) above for measurements and observations. Electronically signed by Coral Else MD on 09/03/2023 at 8:29:46 PM.    Final    DG  FEMUR MIN 2 VIEWS LEFT Result Date: 09/03/2023 CLINICAL DATA:  Left hip pain.  No known injury. EXAM: LEFT FEMUR 2 VIEWS COMPARISON:  None Available. FINDINGS: There is no evidence of fracture or other focal bone lesions. Partially imaged degenerative changes of the knee. Large suprapatellar joint effusion. Soft tissues are unremarkable. IMPRESSION: 1. No acute fracture or dislocation. 2. Partially imaged degenerative changes of the knee with large suprapatellar joint effusion. Electronically Signed   By: Agustin Cree M.D.   On: 09/03/2023 19:07   CT ABDOMEN PELVIS W CONTRAST Result Date: 09/03/2023 CLINICAL DATA:  Abdominal pain, acute, nonlocalized EXAM: CT ABDOMEN AND PELVIS WITH CONTRAST TECHNIQUE: Multidetector CT imaging of the abdomen and pelvis was performed using the standard protocol following bolus administration of intravenous contrast. RADIATION DOSE REDUCTION: This exam was performed according to the departmental dose-optimization program which includes automated exposure control, adjustment of the mA and/or kV according to patient size and/or use of iterative reconstruction technique. CONTRAST:  OMNIPAQUE IOHEXOL 300 MG/ML  SOLN COMPARISON:  CT scan abdomen and pelvis from 06/17/2022. FINDINGS: Lower chest: The lung bases are clear. No pleural effusion. The heart is normal in size. No pericardial effusion. Hepatobiliary: The liver is normal in size. Non-cirrhotic configuration.  No suspicious mass. These is mild diffuse hepatic steatosis. No intrahepatic or extrahepatic bile duct dilation. No calcified gallstones. Normal gallbladder wall thickness. No pericholecystic inflammatory changes. Pancreas: Unremarkable. No pancreatic ductal dilatation or surrounding inflammatory changes. Spleen: Within normal limits. There is a 5 mm hypoattenuating focus in the inferior portion of the spleen, which is too small to adequately characterize. Adrenals/Urinary Tract: Adrenal glands are unremarkable. No suspicious renal mass seen. 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 Bilateral kidneys exhibit extrarenal pelvis. However, no hydronephrosis or hydroureter. Urinary bladder is partially distended despite Foley catheter. There is mild circumferential wall thickening and mucosal hyperattenuation, which can be seen with cystitis. Correlate clinically and with urinalysis. No focal bladder mass, calculi or perivesical fat stranding. Stomach/Bowel: No disproportionate dilation of the small or large bowel loops. No evidence of abnormal bowel wall thickening or inflammatory changes. The appendix is unremarkable. Note is made of moderate stool burden. Vascular/Lymphatic: No ascites or pneumoperitoneum. No abdominal or pelvic lymphadenopathy, by size criteria. No aneurysmal dilation of the major abdominal arteries. There are mild peripheral atherosclerotic vascular calcifications of the aorta and its major branches. Reproductive: Normal size prostate. Symmetric seminal vesicles. Other: The visualized soft tissues and abdominal wall are unremarkable. Musculoskeletal: No suspicious osseous lesions. There are mild - moderate multilevel degenerative changes in the visualized spine. 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 IMPRESSION: *There is mild circumferential wall thickening and mucosal hyperattenuation of the urinary bladder, which can be seen with cystitis. Correlate clinically and with urinalysis. *No other acute inflammatory process identified within the abdomen or pelvis. *Multiple other nonacute observations, as described above. Electronically Signed   By: Jules Schick M.D.   On: 09/03/2023 16:01      Kenn Rekowski T. D[facility] [facility]  If 7PM-7AM, please contact night-coverage [email or web address] 09/04/2023, 1:01 PM

## 2023-09-05 DIAGNOSIS — N3090 Cystitis, unspecified without hematuria: Secondary | ICD-10-CM | POA: Diagnosis not present

## 2023-09-05 DIAGNOSIS — E119 Type 2 diabetes mellitus without complications: Secondary | ICD-10-CM

## 2023-09-05 DIAGNOSIS — I959 Hypotension, unspecified: Secondary | ICD-10-CM | POA: Diagnosis not present

## 2023-09-05 LAB — GLUCOSE, CAPILLARY
Glucose-Capillary: 86 mg/dL (ref 70–99)
Glucose-Capillary: 93 mg/dL (ref 70–99)
Glucose-Capillary: 91 mg/dL (ref 70–99)
Glucose-Capillary: 204 mg/dL — ABNORMAL HIGH (ref 70–99)

## 2023-09-05 LAB — CBC
HCT: 26.8 % — ABNORMAL LOW (ref 39.0–52.0)
Hemoglobin: 8.3 g/dL — ABNORMAL LOW (ref 13.0–17.0)
MCH: 22.6 pg — ABNORMAL LOW (ref 26.0–34.0)
MCHC: 31 g/dL (ref 30.0–36.0)
MCV: 73 fL — ABNORMAL LOW (ref 80.0–100.0)
Platelets: 403 10*3/uL — ABNORMAL HIGH (ref 150–400)
RBC: 3.67 MIL/uL — ABNORMAL LOW (ref 4.22–5.81)
RDW: 17.4 % — ABNORMAL HIGH (ref 11.5–15.5)
WBC: 6.8 10*3/uL (ref 4.0–10.5)
nRBC: 0 % (ref 0.0–0.2)

## 2023-09-05 LAB — RENAL FUNCTION PANEL
Albumin: 1.9 g/dL — ABNORMAL LOW (ref 3.5–5.0)
Anion gap: 8 (ref 5–15)
BUN: 7 mg/dL (ref 6–20)
CO2: 25 mmol/L (ref 22–32)
Calcium: 8.5 mg/dL — ABNORMAL LOW (ref 8.9–10.3)
Chloride: 106 mmol/L (ref 98–111)
Creatinine, Ser: 0.61 mg/dL (ref 0.61–1.24)
GFR, Estimated: >60 mL/min (ref >60)
Glucose, Bld: 82 mg/dL (ref 70–99)
Phosphorus: 2.9 mg/dL (ref 2.5–4.6)
Potassium: 3.4 mmol/L — ABNORMAL LOW (ref 3.5–5.1)
Sodium: 139 mmol/L (ref 135–145)

## 2023-09-05 LAB — MAGNESIUM: Magnesium: 1.6 mg/dL — ABNORMAL LOW (ref 1.7–2.4)

## 2023-09-05 LAB — ACTH: C206 ACTH: 40 pg/mL (ref 7.2–63.3)

## 2023-09-05 MED ORDER — POTASSIUM CHLORIDE CRYS ER 20 MEQ PO TBCR
60.0000 meq | EXTENDED_RELEASE_TABLET | Freq: Once | ORAL | Status: AC
  Administered 2023-09-05: 60 meq via ORAL
  Filled 2023-09-05: qty 3

## 2023-09-05 MED ORDER — MIDODRINE HCL 5 MG PO TABS
5.0000 mg | ORAL_TABLET | Freq: Three times a day (TID) | ORAL | Status: DC
  Administered 2023-09-05: 5 mg via ORAL
  Filled 2023-09-05: qty 1

## 2023-09-05 MED ORDER — MAGNESIUM SULFATE 2 GM/50ML IV SOLN
2.0000 g | Freq: Once | INTRAVENOUS | Status: AC
  Administered 2023-09-05: 2 g via INTRAVENOUS
  Filled 2023-09-05: qty 50

## 2023-09-05 NOTE — Progress Notes (Signed)
OT Cancellation Note  Patient Details Name: Wayne Stokes MRN: [id number] DOB: 1965-11-19   Cancelled Treatment:    Reason Eval/Treat Not Completed: Other (comment) Pt is current long term resident at [facility]. Per chart review, WC bound. PT reports that pt was set-up/Mod I for transfer from bed to [facility]. Current D/C plan is to return to SNF. No apparent immediate acute care OT needs, therefore will defer OT to SNF. If OT eval is needed please call Acute Rehab Dept. at [phone].   Limmie Patricia, OTR/L,CBIS  Supplemental OT - [facility] and [facility] Secure Chat Preferred   09/05/2023, 10:49 AM

## 2023-09-05 NOTE — Evaluation (Addendum)
Physical Therapy Brief Evaluation and Discharge Note Patient Details Name: Wayne Stokes MRN: [id number] DOB: 1965-10-13 Today's Date: 09/05/2023   History of Present Illness  57 yo male admitted with hypotension. Hx of DM, HTN, chronic foley, KP T12 2023, mild cognitive impairment, chronic back pain.  Clinical Impression  On eval, pt was Setup-Mod Ind with mobility. No acute PT needs. Pt able to transfer to/from [facility] without assistance. He propelled his wheelchair ~125 feet without assistance. Per chart review, pt is WC bound. Recommend return to facility once medically ready. 1x eval. Will sign off. Recommend daily OOB<>WC with nursing and/or mobility team as able.        PT Assessment    Assistance Needed at Discharge       Equipment Recommendations None recommended by PT  Recommendations for Other Services       Precautions/Restrictions Precautions Precautions: None Restrictions Weight Bearing Restrictions Per Provider Order: No        Mobility  Bed Mobility          Transfers Overall transfer level: Needs assistance   Transfers: Bed to chair/wheelchair/BSC             General transfer comment: Setup assistance only. Pt safely performed transfer without physical assistance. Pivot to/from WC.    Ambulation/Gait              Home Activity Instructions    Stairs            Modified Rankin (Stroke Patients Only)        Balance                          Pertinent Vitals/Pain   Pain Assessment Pain Assessment: Faces Faces Pain Scale: No hurt     Home Living               Additional Comments: LTC resident at SNF? [facility]    Prior Function        UE/LE Public relations account executive Techniques: Verbal cues;Gestural cues     Cognition         General Comments      Exercises     Assessment/Plan    PT Problem List         PT Visit Diagnosis      No Skilled PT      Co-evaluation                AMPAC 6 Clicks Help needed turning from your back to your side while in a flat bed without using bedrails?: None Help needed moving from lying on your back to sitting on the side of a flat bed without using bedrails?: None Help needed moving to and from a bed to a chair (including a wheelchair)?: None Help needed standing up from a chair using your arms (e.g., wheelchair or bedside chair)?: None Help needed to walk in hospital room?: Total Help needed climbing 3-5 steps with a railing? : Total 6 Click Score: 18      End of Session   Activity Tolerance: Patient tolerated treatment well Patient left: in bed;with call bell/phone within reach;with bed alarm set         Time: 0102-7253 PT Time Calculation (min) (ACUTE ONLY): 13 min  Charges:   PT Evaluation $PT Eval Low Complexity: 1 Low  Faye Ramsay, PT Acute Rehabilitation  Office: [phone]

## 2023-09-05 NOTE — Progress Notes (Signed)
PROGRESS NOTE  Wayne Stokes XBM:[id number] DOB: 01-17-1966   PCP: [name], NP (Inactive)  Patient is from: SNF.  Wheelchair dependent at baseline.  DOA: 09/03/2023 LOS: 2  Chief complaints Chief Complaint  Patient presents with   Hypotension   Abdominal Pain     Brief Narrative / Interim history: 57 year old M with PMH of DM-2, HTN, chronic Foley and wheelchair dependence sent to ED from PCP office due to hypotension and admitted for hypotension and possible UTI.  Patient was hypotensive to 87/64 at PCP office.  Slightly tachycardic to 106.  Afebrile.  In ED, hypotensive to 60/51.  Other vitals normal.  CMP with mild hyponatremia.  WBC 13.8.  Hgb 9.4.  Platelet 464.  Lactic acid 2.2.  Coag labs negative.  Troponin negative.  D-dimer elevated to 3.91.  UA with moderate LE and rare bacteria.  CT abdomen and pelvis with contrast showed mild circumferential wall thickening and mucosal hyperattenuation of the urinary bladder.  Blood and urine cultures obtained.  Resuscitated with IV fluid.  Started on IV ceftriaxone for possible UTI.  TSH low at 0.33.  Free T4 slightly elevated to 1.33.  Hypotension resolved.  Blood cultures NGTD.  Subjective: Seen and examined earlier this morning with the help of phone interpreter.  No major events overnight of this morning.  No complaints.   Objective: Vitals:   09/05/23 0424 09/05/23 0509 09/05/23 0800 09/05/23 1142  BP: 126/84  105/74 125/81  Pulse: 83   63  Resp: 18 15    Temp:    98.4 F (36.9 C)  TempSrc:    Oral  SpO2: 98%   99%  Weight:      Height:        Examination:  GENERAL: No apparent distress.  Nontoxic. HEENT: MMM.  Vision and hearing grossly intact.  NECK: Supple.  No apparent JVD.  RESP:  No IWOB.  Fair aeration bilaterally. CVS:  RRR. Heart sounds normal.  ABD/GI/GU: BS+. Abd soft.  NDNT.  Indwelling Foley. MSK/EXT:  Moves extremities. No apparent deformity. No edema.  SKIN: no apparent skin lesion or  wound NEURO: Awake, alert and oriented to self and place.  Follows commands.  No apparent focal neuro deficit. PSYCH: Calm. Normal affect.   Procedures:  None  Microbiology summarized: Blood cultures NGTD Urine culture pending  Assessment and plan: Hypotension/history of essential hypertension: Hypotensive to 60/51.  Hypotension likely iatrogenic from antihypertensive meds and hydration from poor p.o. intake.  Notable hemoconcentration on admission.  CT abdomen and pelvis suggested cystitis but UA not convincing.  However, he has Foley catheter and suprapubic tenderness.  Orthostatic vitals negative.  ACTH and a.m. cortisol within normal.  D-dimer is elevated but CT angio and LE venous Doppler negative for VTE.  TTE without significant finding.  Hypotension resolved. -Continue antibiotics for suspected UTI -Increase midodrine to 5 mg 3 times daily. -Continue holding home losartan and metoprolol.  Suspected cystitis: Urine culture with insignificant growth. -Continue IV ceftriaxone empirically.  Microcytic anemia: Baseline Hgb 8-9.  Drop in Hgb likely dilutional from IV fluid.  Some blood-tinged stool per RN report.  He could have some hemorrhoids.  Hgb improved after stopping IV fluid. Recent Labs    09/03/23 1120 09/03/23 1927 09/04/23 0822 09/05/23 0408  HGB 9.4* 7.5* 7.6* 8.3*  -Continue monitoring -SCD for VTE prophylaxis   NIDDM-2: A1c 6.1%.  On metformin at home. Recent Labs  Lab 09/04/23 1159 09/04/23 1547 09/04/23 2136 09/05/23 0719 09/05/23 1120  GLUCAP  109* 125* 99 86 93  -Continue SSI-very sensitive   Elevated D-dimer: D-dimer 3.9.  CT angio chest and LE venous Doppler negative.  Debility/physical deconditioning: Uses wheelchair at baseline but able to stand. -Supportive care.  Language barrier -Used phone interpreter.  Primary hyperthyroidism?  TSH 0.33.  Free T4 slightly elevated to 1.33.  Not on Synthroid supplementation. -Needs repeat in 3 to 4  weeks.  Cognitive impairment?  Awake and alert but only oriented to self and place. -Continue home Aricept  Chronic back pain -Continue home meds.  BPH/chronic urinary tension: Has Foley catheter in place. -Continue holding Proscar and fenofibrate for now  Body mass index is 23.68 kg/m.           DVT prophylaxis:  Place and maintain sequential compression device Start: 09/04/23 1150 SCDs Start: 09/03/23 1842  Code Status: Full code Family Communication: None at bedside Level of care: Telemetry Status is: Inpatient Remains inpatient appropriate because: Hypotension and suspected cystitis   Final disposition: SNF Consultants:  None  35 minutes with more than 50% spent in reviewing records, counseling patient/family and coordinating care.   Sch Meds:  Scheduled Meds:  atorvastatin  40 mg Oral QHS   Chlorhexidine Gluconate Cloth  6 each Topical Daily   donepezil  5 mg Oral QHS   gabapentin  300 mg Oral TID   insulin aspart  0-5 Units Subcutaneous QHS   insulin aspart  0-6 Units Subcutaneous TID WC   midodrine  5 mg Oral TID WC   pantoprazole  40 mg Oral Daily   sodium chloride flush  3 mL Intravenous Q12H   Continuous Infusions:  cefTRIAXone (ROCEPHIN)  IV 2 g (09/05/23 0858)   PRN Meds:.acetaminophen **OR** acetaminophen, cyclobenzaprine, mouth rinse, oxyCODONE-acetaminophen, polyethylene glycol  Antimicrobials: Anti-infectives (From admission, onward)    Start     Dose/Rate Route Frequency Ordered Stop   09/04/23 1000  cefTRIAXone (ROCEPHIN) 2 g in sodium chloride 0.9 % 100 mL IVPB        2 g 200 mL/hr over 30 Minutes Intravenous Every 24 hours 09/03/23 1835 09/07/23 0959   09/03/23 1615  cefTRIAXone (ROCEPHIN) 2 g in sodium chloride 0.9 % 100 mL IVPB        2 g 200 mL/hr over 30 Minutes Intravenous  Once 09/03/23 1606 09/03/23 1700        I have personally reviewed the following labs and images: CBC: Recent Labs  Lab 09/03/23 1120 09/03/23 1927  09/04/23 0822 09/05/23 0408  WBC 13.8* 7.8 7.0 6.8  NEUTROABS 8.9*  --   --   --   HGB 9.4* 7.5* 7.6* 8.3*  HCT 30.1* 24.8* 23.8* 26.8*  MCV 73.8* 74.0* 72.8* 73.0*  PLT 464* 369 351 403*   BMP &GFR Recent Labs  Lab 09/03/23 1120 09/03/23 1927 09/04/23 0822 09/05/23 0408  NA 132*  --  138 139  K 4.3  --  3.6 3.4*  CL 97*  --  107 106  CO2 25  --  24 25  GLUCOSE 135*  --  90 82  BUN 13  --  10 7  CREATININE 0.61 0.43* 0.58* 0.61  CALCIUM 8.6*  --  8.3* 8.5*  MG  --   --   --  1.6*  PHOS  --   --   --  2.9   Estimated Creatinine Clearance: 71.2 mL/min (by C-G formula based on SCr of 0.61 mg/dL). Liver & Pancreas: Recent Labs  Lab 09/03/23 1120 09/05/23 0408  AST 17  --   ALT <5  --   ALKPHOS 68  --   BILITOT 0.4  --   PROT 7.8  --   ALBUMIN 2.1* 1.9*   Recent Labs  Lab 09/03/23 1120  LIPASE 27   No results for input(s): "AMMONIA" in the last 168 hours. Diabetic: Recent Labs    09/03/23 1156  HGBA1C 6.1*   Recent Labs  Lab 09/04/23 1159 09/04/23 1547 09/04/23 2136 09/05/23 0719 09/05/23 1120  GLUCAP 109* 125* 99 86 93   Cardiac Enzymes: No results for input(s): "CKTOTAL", "CKMB", "CKMBINDEX", "TROPONINI" in the last 168 hours. No results for input(s): "PROBNP" in the last 8760 hours. Coagulation Profile: Recent Labs  Lab 09/03/23 1120 09/04/23 0822  INR 1.1 1.1   Thyroid Function Tests: Recent Labs    09/03/23 1719  TSH 0.348*  FREET4 1.33*   Lipid Profile: No results for input(s): "CHOL", "HDL", "LDLCALC", "TRIG", "CHOLHDL", "LDLDIRECT" in the last 72 hours. Anemia Panel: Recent Labs    09/04/23 1231 09/04/23 1302  VITAMINB12  --  639  FOLATE  --  20.0  FERRITIN  --  118  TIBC  --  139*  IRON  --  36*  RETICCTPCT 1.3  --    Urine analysis:    Component Value Date/Time   COLORURINE STRAW (A) 09/03/2023 1714   APPEARANCEUR CLEAR 09/03/2023 1714   LABSPEC 1.029 09/03/2023 1714   PHURINE 6.0 09/03/2023 1714   GLUCOSEU  NEGATIVE 09/03/2023 1714   HGBUR NEGATIVE 09/03/2023 1714   BILIRUBINUR NEGATIVE 09/03/2023 1714   BILIRUBINUR small (A) 11/15/2021 1225   KETONESUR NEGATIVE 09/03/2023 1714   PROTEINUR NEGATIVE 09/03/2023 1714   UROBILINOGEN 0.2 11/15/2021 1225   NITRITE NEGATIVE 09/03/2023 1714   LEUKOCYTESUR MODERATE (A) 09/03/2023 1714   Sepsis Labs: Invalid input(s): "PROCALCITONIN", "LACTICIDVEN"  Microbiology: Recent Results (from the past 240 hours)  Blood Culture (routine x 2)     Status: None (Preliminary result)   Collection Time: 09/03/23 11:20 AM   Specimen: BLOOD  Result Value Ref Range Status   Specimen Description   Final    BLOOD LEFT ANTECUBITAL Performed at [facility], [address], [place], [place] [address]    Special Requests   Final    BOTTLES DRAWN AEROBIC AND ANAEROBIC Blood Culture results may not be optimal due to an inadequate volume of blood received in culture bottles Performed at [facility], [address], [place], [place] [address]    Culture   Final    NO GROWTH 2 DAYS Performed at [facility], [address] [place], [place] [address]    Report Status PENDING  Incomplete  Blood Culture (routine x 2)     Status: None (Preliminary result)   Collection Time: 09/03/23 11:47 AM   Specimen: BLOOD RIGHT FOREARM  Result Value Ref Range Status   Specimen Description   Final    BLOOD RIGHT FOREARM Performed at [facility], [address], [place], [place] [address]    Special Requests   Final    BOTTLES DRAWN AEROBIC AND ANAEROBIC Blood Culture results may not be optimal due to an inadequate volume of blood received in culture bottles Performed at [facility], [address] [place], [place] [address]    Culture   Final    NO GROWTH 2 DAYS Performed at [facility], [address], [place], [place] [address]    Report  Status PENDING  Incomplete  Urine Culture     Status:  Abnormal   Collection Time: 09/03/23  5:14 PM   Specimen: Urine, Random  Result Value Ref Range Status   Specimen Description   Final    URINE, RANDOM Performed at [facility], [address] [place], [place] [address]    Special Requests   Final    NONE Reflexed from [id number] Performed at [facility], [address] [place], [place] [address]    Culture (A)  Final    <10,000 COLONIES/mL INSIGNIFICANT GROWTH Performed at [facility], [address] [place], [place] [address]    Report Status 09/04/2023 FINAL  Final    Radiology Studies: CT Angio Chest Pulmonary Embolism (PE) W or WO Contrast Result Date: 09/04/2023 CLINICAL DATA:  Pulmonary embolism (PE) suspected, low to intermediate prob, positive D-dimer EXAM: CT ANGIOGRAPHY CHEST WITH CONTRAST TECHNIQUE: Multidetector CT imaging of the chest was performed using the standard protocol during bolus administration of intravenous contrast. Multiplanar CT image reconstructions and MIPs were obtained to evaluate the vascular anatomy. RADIATION DOSE REDUCTION: This exam was performed according to the departmental dose-optimization program which includes automated exposure control, adjustment of the mA and/or kV according to patient size and/or use of iterative reconstruction technique. CONTRAST:  75mL OMNIPAQUE IOHEXOL 350 MG/ML SOLN COMPARISON:  X-ray 09/03/2023, CT 05/07/2022 FINDINGS: Cardiovascular: Satisfactory opacification of the pulmonary arteries to the segmental level. No evidence of pulmonary embolism. Thoracic aorta is nonaneurysmal. Aortic atherosclerosis. Normal heart size. No pericardial effusion. Mediastinum/Nodes: Mildly enlarged bilateral axillary lymph nodes measuring up to 10 mm short axis. No mediastinal or hilar lymphadenopathy. Thyroid gland, trachea, and esophagus within normal limits. Lungs/Pleura: There is a trace amount of pleural fluid present bilaterally. No focal  airspace consolidation. No pneumothorax. Upper Abdomen: No acute abnormality. Musculoskeletal: Bilateral gynecomastia. No acute bony abnormality. Severe osteoarthritis of the right glenohumeral joint. Review of the MIP images confirms the above findings. IMPRESSION: 1. No evidence of pulmonary embolism. 2. Trace bilateral pleural effusions. 3. Mildly enlarged bilateral axillary lymph nodes, likely reactive. 4. Aortic atherosclerosis (ICD10-I70.0). Electronically Signed   By: Duanne Guess D.O.   On: 09/04/2023 15:56   ECHOCARDIOGRAM COMPLETE Result Date: 09/04/2023    ECHOCARDIOGRAM REPORT   Patient Name:   KEYSHUN COHN    Date of Exam: 09/04/2023 Medical Rec #:  [id number]  Height:       60.0 in Accession #:    [id number] Weight:       121.3 lb Date of Birth:  Mar 16, 1966   BSA:          1.509 m Patient Age:    57 years   BP:           118/79 mmHg Patient Gender: M          HR:           58 bpm. Exam Location:  Inpatient Procedure: 2D Echo, Cardiac Doppler and Color Doppler Indications:    Cardiomyopathy-hypertrophic  History:        Patient has no prior history of Echocardiogram examinations.                 Risk Factors:Hypertension, Diabetes and Dyslipidemia.  Sonographer:    Karma Ganja Referring Phys: [id number] [facility] GOEL  Sonographer Comments: Image acquisition challenging due to respiratory motion. IMPRESSIONS  1. Left ventricular ejection fraction, by estimation, is 55 to 60%. The left ventricle has normal function. The  left ventricle has no regional wall motion abnormalities. Left ventricular diastolic parameters were normal.  2. Right ventricular systolic function is normal. The right ventricular size is normal. There is mildly elevated pulmonary artery systolic pressure.  3. The mitral valve is normal in structure. Trivial mitral valve regurgitation. No evidence of mitral stenosis.  4. The aortic valve is tricuspid. Aortic valve regurgitation is trivial. No aortic stenosis is present.  5. The inferior vena  cava is normal in size with greater than 50% respiratory variability, suggesting right atrial pressure of 3 mmHg. Comparison(s): No prior Echocardiogram. FINDINGS  Left Ventricle: Left ventricular ejection fraction, by estimation, is 55 to 60%. The left ventricle has normal function. The left ventricle has no regional wall motion abnormalities. The left ventricular internal cavity size was normal in size. There is  no left ventricular hypertrophy. Left ventricular diastolic parameters were normal. Right Ventricle: The right ventricular size is normal. Right ventricular systolic function is normal. There is mildly elevated pulmonary artery systolic pressure. The tricuspid regurgitant velocity is 2.81 m/s, and with an assumed right atrial pressure of 8 mmHg, the estimated right ventricular systolic pressure is 39.6 mmHg. Left Atrium: Left atrial size was normal in size. Right Atrium: Right atrial size was normal in size. Pericardium: Trivial pericardial effusion is present. Mitral Valve: The mitral valve is normal in structure. Trivial mitral valve regurgitation. No evidence of mitral valve stenosis. Tricuspid Valve: The tricuspid valve is normal in structure. Tricuspid valve regurgitation is trivial. No evidence of tricuspid stenosis. Aortic Valve: The aortic valve is tricuspid. Aortic valve regurgitation is trivial. Aortic regurgitation PHT measures 707 msec. No aortic stenosis is present. Aortic valve mean gradient measures 3.0 mmHg. Aortic valve peak gradient measures 7.6 mmHg. Aortic valve area, by VTI measures 2.25 cm. Pulmonic Valve: The pulmonic valve was normal in structure. Pulmonic valve regurgitation is not visualized. No evidence of pulmonic stenosis. Aorta: The aortic root is normal in size and structure. Venous: The inferior vena cava is normal in size with greater than 50% respiratory variability, suggesting right atrial pressure of 3 mmHg. IAS/Shunts: No atrial level shunt detected by color flow  Doppler.  LEFT VENTRICLE PLAX 2D LVIDd:         4.20 cm   Diastology LVIDs:         2.00 cm   LV e' medial:    9.36 cm/s LV PW:         0.90 cm   LV E/e' medial:  10.3 LV IVS:        1.00 cm   LV e' lateral:   12.80 cm/s LVOT diam:     2.00 cm   LV E/e' lateral: 7.5 LV SV:         67 LV SV Index:   44 LVOT Area:     3.14 cm  RIGHT VENTRICLE             IVC RV Basal diam:  3.30 cm     IVC diam: 2.10 cm RV S prime:     11.50 cm/s TAPSE (M-mode): 2.7 cm LEFT ATRIUM             Index        RIGHT ATRIUM           Index LA diam:        3.20 cm 2.12 cm/m   RA Area:     13.30 cm LA Vol (A2C):   54.3 ml 35.98 ml/m  RA Volume:  29.00 ml  19.22 ml/m LA Vol (A4C):   37.9 ml 25.12 ml/m LA Biplane Vol: 48.4 ml 32.07 ml/m  AORTIC VALVE AV Area (Vmax):    1.81 cm AV Area (Vmean):   2.08 cm AV Area (VTI):     2.25 cm AV Vmax:           138.00 cm/s AV Vmean:          83.700 cm/s AV VTI:            0.296 m AV Peak Grad:      7.6 mmHg AV Mean Grad:      3.0 mmHg LVOT Vmax:         79.60 cm/s LVOT Vmean:        55.500 cm/s LVOT VTI:          0.212 m LVOT/AV VTI ratio: 0.72 AI PHT:            707 msec  AORTA Ao Root diam: 2.90 cm Ao Asc diam:  3.10 cm MITRAL VALVE               TRICUSPID VALVE MV Area (PHT): 4.06 cm    TR Peak grad:   31.6 mmHg MV Decel Time: 187 msec    TR Vmax:        281.00 cm/s MV E velocity: 96.00 cm/s MV A velocity: 73.30 cm/s  SHUNTS MV E/A ratio:  1.31        Systemic VTI:  0.21 m                            Systemic Diam: 2.00 cm Olga Millers MD Electronically signed by Olga Millers MD Signature Date/Time: 09/04/2023/3:40:40 PM    Final       Aubert Choyce T. [place]  If 7PM-7AM, please contact night-coverage [email or web address] 09/05/2023, 1:47 PM

## 2023-09-06 DIAGNOSIS — I959 Hypotension, unspecified: Secondary | ICD-10-CM | POA: Diagnosis not present

## 2023-09-06 DIAGNOSIS — E119 Type 2 diabetes mellitus without complications: Secondary | ICD-10-CM | POA: Diagnosis not present

## 2023-09-06 DIAGNOSIS — N3090 Cystitis, unspecified without hematuria: Secondary | ICD-10-CM | POA: Diagnosis not present

## 2023-09-06 LAB — RENAL FUNCTION PANEL
Albumin: 2.1 g/dL — ABNORMAL LOW (ref 3.5–5.0)
Anion gap: 7 (ref 5–15)
BUN: 8 mg/dL (ref 6–20)
CO2: 24 mmol/L (ref 22–32)
Calcium: 8.1 mg/dL — ABNORMAL LOW (ref 8.9–10.3)
Chloride: 104 mmol/L (ref 98–111)
Creatinine, Ser: 0.51 mg/dL — ABNORMAL LOW (ref 0.61–1.24)
GFR, Estimated: >60 mL/min (ref >60)
Glucose, Bld: 120 mg/dL — ABNORMAL HIGH (ref 70–99)
Phosphorus: 3 mg/dL (ref 2.5–4.6)
Potassium: 3.9 mmol/L (ref 3.5–5.1)
Sodium: 135 mmol/L (ref 135–145)

## 2023-09-06 LAB — CBC
HCT: 28.5 % — ABNORMAL LOW (ref 39.0–52.0)
Hemoglobin: 8.7 g/dL — ABNORMAL LOW (ref 13.0–17.0)
MCH: 22.5 pg — ABNORMAL LOW (ref 26.0–34.0)
MCHC: 30.5 g/dL (ref 30.0–36.0)
MCV: 73.6 fL — ABNORMAL LOW (ref 80.0–100.0)
Platelets: 390 10*3/uL (ref 150–400)
RBC: 3.87 MIL/uL — ABNORMAL LOW (ref 4.22–5.81)
RDW: 17.6 % — ABNORMAL HIGH (ref 11.5–15.5)
WBC: 9.3 10*3/uL (ref 4.0–10.5)
nRBC: 0 % (ref 0.0–0.2)

## 2023-09-06 LAB — GLUCOSE, CAPILLARY
Glucose-Capillary: 93 mg/dL (ref 70–99)
Glucose-Capillary: 106 mg/dL — ABNORMAL HIGH (ref 70–99)

## 2023-09-06 LAB — MAGNESIUM: Magnesium: 2 mg/dL (ref 1.7–2.4)

## 2023-09-06 LAB — MRSA NEXT GEN BY PCR, NASAL: MRSA by PCR Next Gen: NOT DETECTED

## 2023-09-06 MED ORDER — SENNOSIDES-DOCUSATE SODIUM 8.6-50 MG PO TABS: 1.0000 | ORAL_TABLET | Freq: Two times a day (BID) | ORAL | Status: AC | PRN

## 2023-09-06 NOTE — Progress Notes (Addendum)
PROGRESS NOTE  Wayne Stokes HQI:[id number] DOB: 1966/02/23   PCP: [name], NP (Inactive)  Patient is from: SNF.  Wheelchair dependent at baseline.  DOA: 09/03/2023 LOS: 3  Chief complaints Chief Complaint  Patient presents with   Hypotension   Abdominal Pain     Brief Narrative / Interim history: 57 year old M with PMH of DM-2, HTN, chronic Foley and wheelchair dependence sent to ED from PCP office due to hypotension and admitted for hypotension and possible UTI.  Patient was hypotensive to 87/64 at PCP office.  Slightly tachycardic to 106.  Afebrile.  In ED, hypotensive to 60/51.  Other vitals normal.  CMP with mild hyponatremia.  WBC 13.8.  Hgb 9.4.  Platelet 464.  Lactic acid 2.2.  Coag labs negative.  Troponin negative.  D-dimer elevated to 3.91.  UA with moderate LE and rare bacteria.  CT abdomen and pelvis with contrast showed mild circumferential wall thickening and mucosal hyperattenuation of the urinary bladder.  Blood and urine cultures obtained.  Resuscitated with IV fluid.  Started on IV ceftriaxone for possible UTI.  TSH low at 0.33.  Free T4 slightly elevated to 1.33.  Hypotension resolved.  Blood cultures NGTD.  Medically stable to return to SNF.  Subjective: Seen and examined earlier this morning with the help of phone interpreter.  No major events overnight of this morning.  No complaints.  Objective: Vitals:   09/05/23 0800 09/05/23 1142 09/05/23 2033 09/06/23 0419  BP: 105/74 125/81 101/73 97/85  Pulse:  63 77 75  Resp:   16 16  Temp:  98.4 F (36.9 C) 97.6 F (36.4 C) 98 F (36.7 C)  TempSrc:  Oral Oral Oral  SpO2:  99% 100% 98%  Weight:      Height:        Examination:  GENERAL: No apparent distress.  Nontoxic. HEENT: MMM.  Vision and hearing grossly intact.  NECK: Supple.  No apparent JVD.  RESP:  No IWOB.  Fair aeration bilaterally. CVS:  RRR. Heart sounds normal.  ABD/GI/GU: BS+. Abd soft.  NDNT.  Indwelling Foley. MSK/EXT:  Moves  extremities. No apparent deformity. No edema.  SKIN: no apparent skin lesion or wound NEURO: Awake, alert and oriented to self and place.  Follows commands.  No apparent focal neuro deficit. PSYCH: Calm. Normal affect.   Procedures:  None  Microbiology summarized: Blood cultures NGTD Urine culture pending  Assessment and plan: Hypotension/history of essential hypertension: Hypotensive to 60/51.  Hypotension likely iatrogenic from antihypertensive meds and hydration from poor p.o. intake.  Notable hemoconcentration on admission.  CT abdomen and pelvis suggested cystitis but UA not convincing.  However, he has Foley catheter and suprapubic tenderness.  Orthostatic vitals negative.  ACTH and a.m. cortisol within normal.  D-dimer is elevated but CT angio and LE venous Doppler negative for VTE.  TTE without significant finding.  Hypotension resolved. -Completed antibiotic course with IV ceftriaxone from 12/26-12/29. -Continue holding home losartan and metoprolol-will discontinue on discharge.  Suspected cystitis: Urine culture with insignificant growth. -Completed antibiotic course with IV ceftriaxone from 12/26-12/29  Microcytic anemia: Baseline Hgb 8-9.  Drop in Hgb likely dilutional from IV fluid.  Some blood-tinged stool per RN report.  He could have some hemorrhoids.  Hgb improved after stopping IV fluid. Recent Labs    09/03/23 1120 09/03/23 1927 09/04/23 0822 09/05/23 0408 09/06/23 0358  HGB 9.4* 7.5* 7.6* 8.3* 8.7*  -Continue monitoring -SCD for VTE prophylaxis   NIDDM-2: A1c 6.1%.  On metformin at  home. Recent Labs  Lab 09/05/23 0719 09/05/23 1120 09/05/23 1631 09/05/23 2034 09/06/23 0730  GLUCAP 86 93 91 204* 93  -Discontinue SSI and CBG monitoring -Liberate diet to regular   Elevated D-dimer: D-dimer 3.9.  CT angio chest and LE venous Doppler negative.  Debility/physical deconditioning: Uses wheelchair at baseline but able to stand. -Supportive care.  Language  barrier -Used phone interpreter.  Primary hyperthyroidism?  TSH 0.33.  Free T4 slightly elevated to 1.33.  Not on Synthroid supplementation. -Needs repeat in 3 to 4 weeks.  Cognitive impairment?  Awake and alert but only oriented to self and place. -Continue home Aricept  Chronic back pain -Continue home meds.  Hypokalemia/hypomagnesemia: Resolved  BPH/chronic urinary tension: Has Foley catheter in place. -Continue holding Proscar and Flomax for now -Outpatient follow-up with urology  Body mass index is 23.68 kg/m.           DVT prophylaxis:  Place and maintain sequential compression device Start: 09/04/23 1150 SCDs Start: 09/03/23 1842  Code Status: Full code Family Communication: None at bedside Level of care: Telemetry Status is: Inpatient Remains inpatient appropriate because: Hypotension and suspected cystitis   Final disposition: SNF Consultants:  None  35 minutes with more than 50% spent in reviewing records, counseling patient/family and coordinating care.   Sch Meds:  Scheduled Meds:  atorvastatin  40 mg Oral QHS   Chlorhexidine Gluconate Cloth  6 each Topical Daily   donepezil  5 mg Oral QHS   gabapentin  300 mg Oral TID   insulin aspart  0-5 Units Subcutaneous QHS   insulin aspart  0-6 Units Subcutaneous TID WC   pantoprazole  40 mg Oral Daily   sodium chloride flush  3 mL Intravenous Q12H   Continuous Infusions:   PRN Meds:.acetaminophen **OR** acetaminophen, cyclobenzaprine, mouth rinse, oxyCODONE-acetaminophen, polyethylene glycol  Antimicrobials: Anti-infectives (From admission, onward)    Start     Dose/Rate Route Frequency Ordered Stop   09/04/23 1000  cefTRIAXone (ROCEPHIN) 2 g in sodium chloride 0.9 % 100 mL IVPB        2 g 200 mL/hr over 30 Minutes Intravenous Every 24 hours 09/03/23 1835 09/06/23 0945   09/03/23 1615  cefTRIAXone (ROCEPHIN) 2 g in sodium chloride 0.9 % 100 mL IVPB        2 g 200 mL/hr over 30 Minutes  Intravenous  Once 09/03/23 1606 09/03/23 1700        I have personally reviewed the following labs and images: CBC: Recent Labs  Lab 09/03/23 1120 09/03/23 1927 09/04/23 0822 09/05/23 0408 09/06/23 0358  WBC 13.8* 7.8 7.0 6.8 9.3  NEUTROABS 8.9*  --   --   --   --   HGB 9.4* 7.5* 7.6* 8.3* 8.7*  HCT 30.1* 24.8* 23.8* 26.8* 28.5*  MCV 73.8* 74.0* 72.8* 73.0* 73.6*  PLT 464* 369 351 403* 390   BMP &GFR Recent Labs  Lab 09/03/23 1120 09/03/23 1927 09/04/23 0822 09/05/23 0408 09/06/23 0358  NA 132*  --  138 139 135  K 4.3  --  3.6 3.4* 3.9  CL 97*  --  107 106 104  CO2 25  --  24 25 24   GLUCOSE 135*  --  90 82 120*  BUN 13  --  10 7 8   CREATININE 0.61 0.43* 0.58* 0.61 0.51*  CALCIUM 8.6*  --  8.3* 8.5* 8.1*  MG  --   --   --  1.6* 2.0  PHOS  --   --   --  2.9 3.0   Estimated Creatinine Clearance: 71.2 mL/min (A) (by C-G formula based on SCr of 0.51 mg/dL (L)). Liver & Pancreas: Recent Labs  Lab 09/03/23 1120 09/05/23 0408 09/06/23 0358  AST 17  --   --   ALT <5  --   --   ALKPHOS 68  --   --   BILITOT 0.4  --   --   PROT 7.8  --   --   ALBUMIN 2.1* 1.9* 2.1*   Recent Labs  Lab 09/03/23 1120  LIPASE 27   No results for input(s): "AMMONIA" in the last 168 hours. Diabetic: Recent Labs    09/03/23 1156  HGBA1C 6.1*   Recent Labs  Lab 09/05/23 0719 09/05/23 1120 09/05/23 1631 09/05/23 2034 09/06/23 0730  GLUCAP 86 93 91 204* 93   Cardiac Enzymes: No results for input(s): "CKTOTAL", "CKMB", "CKMBINDEX", "TROPONINI" in the last 168 hours. No results for input(s): "PROBNP" in the last 8760 hours. Coagulation Profile: Recent Labs  Lab 09/03/23 1120 09/04/23 0822  INR 1.1 1.1   Thyroid Function Tests: Recent Labs    09/03/23 1719  TSH 0.348*  FREET4 1.33*   Lipid Profile: No results for input(s): "CHOL", "HDL", "LDLCALC", "TRIG", "CHOLHDL", "LDLDIRECT" in the last 72 hours. Anemia Panel: Recent Labs    09/04/23 1231 09/04/23 1302   VITAMINB12  --  639  FOLATE  --  20.0  FERRITIN  --  118  TIBC  --  139*  IRON  --  36*  RETICCTPCT 1.3  --    Urine analysis:    Component Value Date/Time   COLORURINE STRAW (A) 09/03/2023 1714   APPEARANCEUR CLEAR 09/03/2023 1714   LABSPEC 1.029 09/03/2023 1714   PHURINE 6.0 09/03/2023 1714   GLUCOSEU NEGATIVE 09/03/2023 1714   HGBUR NEGATIVE 09/03/2023 1714   BILIRUBINUR NEGATIVE 09/03/2023 1714   BILIRUBINUR small (A) 11/15/2021 1225   KETONESUR NEGATIVE 09/03/2023 1714   PROTEINUR NEGATIVE 09/03/2023 1714   UROBILINOGEN 0.2 11/15/2021 1225   NITRITE NEGATIVE 09/03/2023 1714   LEUKOCYTESUR MODERATE (A) 09/03/2023 1714   Sepsis Labs: Invalid input(s): "PROCALCITONIN", "LACTICIDVEN"  Microbiology: Recent Results (from the past 240 hours)  Blood Culture (routine x 2)     Status: None (Preliminary result)   Collection Time: 09/03/23 11:20 AM   Specimen: BLOOD  Result Value Ref Range Status   Specimen Description   Final    BLOOD LEFT ANTECUBITAL Performed at [facility] [place], [address], [place], [place] [address]    Special Requests   Final    BOTTLES DRAWN AEROBIC AND ANAEROBIC Blood Culture results may not be optimal due to an inadequate volume of blood received in culture bottles Performed at [facility], [address] [place], [place] [address]    Culture   Final    NO GROWTH 3 DAYS Performed at [facility], [address], [place], [place] [address]    Report Status PENDING  Incomplete  Blood Culture (routine x 2)     Status: None (Preliminary result)   Collection Time: 09/03/23 11:47 AM   Specimen: BLOOD RIGHT FOREARM  Result Value Ref Range Status   Specimen Description   Final    BLOOD RIGHT FOREARM Performed at [facility], [address] [place], [place] [address]    Special Requests   Final    BOTTLES DRAWN AEROBIC AND ANAEROBIC Blood Culture results may not be optimal due to an inadequate  volume of blood received in culture bottles Performed at [facility] [place], [address], [place], [place] [address]    Culture   Final    NO GROWTH 3 DAYS Performed at [facility], [address] [place], [place] [address]    Report Status PENDING  Incomplete  Urine Culture     Status: Abnormal   Collection Time: 09/03/23  5:14 PM   Specimen: Urine, Random  Result Value Ref Range Status   Specimen Description   Final    URINE, RANDOM Performed at [facility], [address] [place], [place] [address]    Special Requests   Final    NONE Reflexed from [id number] Performed at [facility], [address] [place], [place] [address]    Culture (A)  Final    <10,000 COLONIES/mL INSIGNIFICANT GROWTH Performed at [facility], [address], [place], [place] [address]    Report Status 09/04/2023 FINAL  Final    Radiology Studies: No results found.     Eriel Dunckel T. Kamden Reber [facility]  If 7PM-7AM, please contact night-coverage [email or web address] 09/06/2023, 11:40 AM

## 2023-09-06 NOTE — TOC Progression Note (Signed)
Transition of Care [facility]) - Progression Note    Patient Details  Name: Wayne Stokes MRN: [id number] Date of Birth: 1966-03-01  Transition of Care [facility]) CM/SW Contact  Darleene Cleaver, [place] Phone Number: 09/06/2023, 7:18 PM  Clinical Narrative:      CSW attempted to contact Crystal at [facility] to see if patient can return, message left on voice mail.  CSW awaiting for call back.      Expected Discharge Plan and Services    [facility].                                           Social Determinants of Health (SDOH) Interventions SDOH Screenings   Food Insecurity: Food Insecurity Present (05/27/2022)  Housing: Patient Unable To Answer (09/04/2023)  Transportation Needs: Patient Unable To Answer (09/04/2023)  Utilities: Patient Unable To Answer (09/04/2023)  Depression (PHQ2-9): High Risk (09/03/2023)  Tobacco Use: Low Risk  (09/03/2023)    Readmission Risk Interventions     No data to display

## 2023-09-06 NOTE — Plan of Care (Signed)
°  Problem: Metabolic: Goal: Ability to maintain appropriate glucose levels will improve Outcome: Progressing   Problem: Clinical Measurements: Goal: Respiratory complications will improve Outcome: Progressing Goal: Cardiovascular complication will be avoided Outcome: Progressing   Problem: Activity: Goal: Risk for activity intolerance will decrease Outcome: Progressing   Problem: Elimination: Goal: Will not experience complications related to bowel motility Outcome: Progressing   Problem: Pain Management: Goal: General experience of comfort will improve Outcome: Progressing   Problem: Safety: Goal: Ability to remain free from injury will improve Outcome: Progressing

## 2023-09-06 NOTE — Progress Notes (Signed)
Interpreter used to communicate with pt.

## 2023-09-07 DIAGNOSIS — N3090 Cystitis, unspecified without hematuria: Secondary | ICD-10-CM | POA: Diagnosis not present

## 2023-09-07 DIAGNOSIS — E119 Type 2 diabetes mellitus without complications: Secondary | ICD-10-CM | POA: Diagnosis not present

## 2023-09-07 DIAGNOSIS — I959 Hypotension, unspecified: Secondary | ICD-10-CM | POA: Diagnosis not present

## 2023-09-07 NOTE — Plan of Care (Signed)
  Problem: Coping: Goal: Ability to adjust to condition or change in health will improve Outcome: Progressing   Problem: Metabolic: Goal: Ability to maintain appropriate glucose levels will improve Outcome: Progressing   Problem: Clinical Measurements: Goal: Diagnostic test results will improve Outcome: Progressing Goal: Respiratory complications will improve Outcome: Progressing Goal: Cardiovascular complication will be avoided Outcome: Progressing   Problem: Pain Management: Goal: General experience of comfort will improve Outcome: Progressing   Problem: Safety: Goal: Ability to remain free from injury will improve Outcome: Progressing

## 2023-09-07 NOTE — TOC Transition Note (Signed)
Transition of Care [facility]) - Discharge Note   Patient Details  Name: Wayne Stokes MRN: [id number] Date of Birth: 1965-12-04  Transition of Care [facility]) CM/SW Contact:  Larrie Kass, LCSW Phone Number: 09/07/2023, 12:02 PM   Clinical Narrative:    Patient to discharge back to [facility]. CSW spoke with the patient via interpreter. The patient reported concerns with his facility, stating that he does not receive his medications on time and feels he is not treated properly. CSW spoke with one of the patient's caseworkers, who is assisting him with transitioning to another facility. CSW informed the patient that he will have to return to [facility] today and follow up with his caseworker. RN to call report to [phone], PTAR called no further TOC needs TOC sign off.    Final next level of care: Long Term Nursing Home Barriers to Discharge: No Barriers Identified   Patient Goals and CMS Choice Patient states their goals for this hospitalization and ongoing recovery are:: return to LTC facility          Discharge Placement                Patient to be transferred to facility by: ems   Patient and family notified of of transfer: 09/07/23  Discharge Plan and Services Additional resources added to the After Visit Summary for                                       Social Drivers of Health (SDOH) Interventions SDOH Screenings   Food Insecurity: Food Insecurity Present (05/27/2022)  Housing: Patient Unable To Answer (09/04/2023)  Transportation Needs: Patient Unable To Answer (09/04/2023)  Utilities: Patient Unable To Answer (09/04/2023)  Depression (PHQ2-9): High Risk (09/03/2023)  Tobacco Use: Low Risk  (09/03/2023)     Readmission Risk Interventions     No data to display

## 2023-09-07 NOTE — Discharge Summary (Signed)
Physician Discharge Summary  Wayne Stokes ZOX:[id number] DOB: August 31, 1966 DOA: 09/03/2023  PCP: [name], NP (Inactive)  Admit date: 09/03/2023 Discharge date: 09/07/2023 Admitted From: SNF Disposition: SNF Recommendations for Outpatient Follow-up:  Outpatient follow-up with urology as previously planned Check CBC and CMP in 1 week Check thyroid panel in 3 to 4 weeks Please follow up on the following pending results: None   Discharge Condition: Stable CODE STATUS: Full code   Hospital course 57 year old M with PMH of DM-2, HTN, chronic Foley and wheelchair dependence sent to ED from PCP office due to hypotension and admitted for hypotension and possible UTI.  Patient was hypotensive to 87/64 at PCP office.  Slightly tachycardic to 106.  Afebrile.   In ED, hypotensive to 60/51.  Other vitals normal.  CMP with mild hyponatremia.  WBC 13.8.  Hgb 9.4.  Platelet 464.  Lactic acid 2.2.  Coag labs negative.  Troponin negative.  D-dimer elevated to 3.91.  UA with moderate LE and rare bacteria.  CT abdomen and pelvis with contrast showed mild circumferential wall thickening and mucosal hyperattenuation of the urinary bladder.  Blood and urine cultures obtained.  Resuscitated with IV fluid.  Started on IV ceftriaxone for possible UTI.   TSH low at 0.33.  Free T4 slightly elevated to 1.33.  Hypotension resolved.  Blood cultures NGTD.  Medically stable to return to SNF.  See individual problem list below for more.   Problems addressed during this hospitalization Hypotension/history of essential hypertension: Hypotensive to 60/51.  Hypotension likely iatrogenic from antihypertensive meds and hydration from poor p.o. intake.  Notable hemoconcentration on admission.  CT abdomen and pelvis suggested cystitis but UA not convincing.  However, he has Foley catheter and suprapubic tenderness.  Orthostatic vitals negative.  ACTH and a.m. cortisol within normal.  D-dimer is elevated but CT angio and LE  venous Doppler negative for VTE.  TTE without significant finding.  Hypotension resolved. -Completed antibiotic course with IV ceftriaxone from 12/26-12/29. -Discontinue losartan and metoprolol.  Normotensive off those meds in the hospital   Suspected cystitis: Urine culture with insignificant growth. -Completed antibiotic course with IV ceftriaxone from 12/26-12/29   Microcytic anemia: Baseline Hgb 8-9.  Drop in Hgb likely dilutional from IV fluid.  Some blood-tinged stool per RN report.  He could have some hemorrhoids.  Hgb improved and back to baseline after stopping IV fluid. -Recheck CBC in 1 week  NIDDM-2: A1c 6.1%.  On metformin at home. -Liberated diet -Can continue home metformin  Elevated D-dimer: D-dimer 3.9.  CT angio chest and LE venous Doppler negative.   Debility/physical deconditioning: Uses wheelchair at baseline but able to stand. -Supportive care.   Language barrier -Used phone interpreter.   Primary hyperthyroidism?  TSH 0.33.  Free T4 slightly elevated to 1.33.  Not on Synthroid supplementation.  No clinical signs of hyperthyroidism. -Needs repeat thyroid panel in 3 to 4 weeks.   Cognitive impairment?  Awake and alert but only oriented to self and place. -Continue home Aricept   Chronic back pain -Continue home meds.   Hypokalemia/hypomagnesemia: Resolved   BPH/chronic urinary tension: Has Foley catheter in place. -Continue home Proscar and Flomax. -Outpatient follow-up with urology as previously planned            Time spent 35 minutes  Vital signs Vitals:   09/06/23 0419 09/06/23 1300 09/06/23 2059 09/07/23 0506  BP: 97/85 97/71 98/78  105/75  Pulse: 75 89 83 88  Temp: 98 F (36.7 C) 98.6 F (37  C) 98 F (36.7 C) 98 F (36.7 C)  Resp: 16  16 16   Height:      Weight:      SpO2: 98% 99% 100% 100%  TempSrc: Oral Oral    BMI (Calculated):         Discharge exam  GENERAL: No apparent distress.  Nontoxic. HEENT: MMM.  Vision and  hearing grossly intact.  NECK: Supple.  No apparent JVD.  RESP:  No IWOB.  Fair aeration bilaterally. CVS:  RRR. Heart sounds normal.  ABD/GI/GU: BS+. Abd soft, NTND.  Indwelling Foley catheter. MSK/EXT:  Moves extremities. No apparent deformity. No edema.  SKIN: no apparent skin lesion or wound NEURO: Awake and alert. Oriented to self and place.  Follows commands.  No apparent focal neuro deficit. PSYCH: Calm. Normal affect.   Discharge Instructions Discharge Instructions     Diet general   Complete by: As directed    Increase activity slowly   Complete by: As directed       Allergies as of 09/07/2023       Reactions   Beef-derived Drug Products Swelling   Shellfish Allergy Rash   Egg-derived Products Swelling   Other Other (See Comments)   Egg plant , pizza - swelling        Medication List     STOP taking these medications    diclofenac 75 MG EC tablet Commonly known as: VOLTAREN   losartan 50 MG tablet Commonly known as: COZAAR   metoprolol succinate 25 MG 24 hr tablet Commonly known as: TOPROL-XL       TAKE these medications    acetaminophen 325 MG tablet Commonly known as: TYLENOL Take 2 tablets (650 mg total) by mouth every 6 (six) hours as needed for mild pain, fever or headache. What changed: Another medication with the same name was removed. Continue taking this medication, and follow the directions you see here.   ascorbic acid 250 MG Chew Commonly known as: VITAMIN C Chew 250 mg by mouth daily.   atorvastatin 40 MG tablet Commonly known as: LIPITOR Take 1 tablet (40 mg total) by mouth daily.   bisacodyl 10 MG suppository Commonly known as: DULCOLAX Place 1 suppository (10 mg total) rectally daily as needed for moderate constipation.   cholecalciferol 10 MCG/ML Liqd oral liquid Commonly known as: VITAMIN D3 Take 400 Units by mouth daily.   cyclobenzaprine 5 MG tablet Commonly known as: FLEXERIL Take 5 mg by mouth 3 (three) times  daily as needed for muscle spasms.   docusate sodium 100 MG capsule Commonly known as: COLACE Take 100 mg by mouth 2 (two) times daily.   donepezil 5 MG tablet Commonly known as: ARICEPT Take 1 tablet (5 mg total) by mouth at bedtime.   feeding supplement Liqd Take 237 mLs by mouth 3 (three) times daily between meals.   ferrous sulfate 325 (65 FE) MG EC tablet Take 325 mg by mouth 2 (two) times daily. What changed: Another medication with the same name was removed. Continue taking this medication, and follow the directions you see here.   finasteride 5 MG tablet Commonly known as: PROSCAR Take 1 tablet (5 mg total) by mouth daily.   gabapentin 300 MG capsule Commonly known as: NEURONTIN Take 1 capsule (300 mg total) by mouth 3 (three) times daily. What changed: how much to take   metFORMIN 500 MG tablet Commonly known as: GLUCOPHAGE Take 1 tablet (500 mg total) by mouth 2 (two) times daily with a  meal.   Omega 3 1000 MG Caps Take 1,000 mg by mouth daily.   oxyCODONE-acetaminophen 5-325 MG tablet Commonly known as: Percocet Take 1 tablet by mouth every 4 (four) hours as needed for severe pain.   pantoprazole 40 MG tablet Commonly known as: PROTONIX Take 1 tablet (40 mg total) by mouth daily.   senna-docusate 8.6-50 MG tablet Commonly known as: Senokot-S Take 1 tablet by mouth 2 (two) times daily between meals as needed for mild constipation.   tamsulosin 0.4 MG Caps capsule Commonly known as: FLOMAX Take 1 capsule (0.4 mg total) by mouth daily after supper.        Consultations: None  Procedures/Studies:   CT Angio Chest Pulmonary Embolism (PE) W or WO Contrast Result Date: 09/04/2023 CLINICAL DATA:  Pulmonary embolism (PE) suspected, low to intermediate prob, positive D-dimer EXAM: CT ANGIOGRAPHY CHEST WITH CONTRAST TECHNIQUE: Multidetector CT imaging of the chest was performed using the standard protocol during bolus administration of intravenous  contrast. Multiplanar CT image reconstructions and MIPs were obtained to evaluate the vascular anatomy. RADIATION DOSE REDUCTION: This exam was performed according to the departmental dose-optimization program which includes automated exposure control, adjustment of the mA and/or kV according to patient size and/or use of iterative reconstruction technique. CONTRAST:  75mL OMNIPAQUE IOHEXOL 350 MG/ML SOLN COMPARISON:  X-ray 09/03/2023, CT 05/07/2022 FINDINGS: Cardiovascular: Satisfactory opacification of the pulmonary arteries to the segmental level. No evidence of pulmonary embolism. Thoracic aorta is nonaneurysmal. Aortic atherosclerosis. Normal heart size. No pericardial effusion. Mediastinum/Nodes: Mildly enlarged bilateral axillary lymph nodes measuring up to 10 mm short axis. No mediastinal or hilar lymphadenopathy. Thyroid gland, trachea, and esophagus within normal limits. Lungs/Pleura: There is a trace amount of pleural fluid present bilaterally. No focal airspace consolidation. No pneumothorax. Upper Abdomen: No acute abnormality. Musculoskeletal: Bilateral gynecomastia. No acute bony abnormality. Severe osteoarthritis of the right glenohumeral joint. Review of the MIP images confirms the above findings. IMPRESSION: 1. No evidence of pulmonary embolism. 2. Trace bilateral pleural effusions. 3. Mildly enlarged bilateral axillary lymph nodes, likely reactive. 4. Aortic atherosclerosis (ICD10-I70.0). Electronically Signed   By: Duanne Guess D.O.   On: 09/04/2023 15:56   ECHOCARDIOGRAM COMPLETE Result Date: 09/04/2023    ECHOCARDIOGRAM REPORT   Patient Name:   Wayne Stokes    Date of Exam: 09/04/2023 Medical Rec #:  [id number]  Height:       60.0 in Accession #:    [id number] Weight:       121.3 lb Date of Birth:  02-02-1966   BSA:          1.509 m Patient Age:    57 years   BP:           118/79 mmHg Patient Gender: M          HR:           58 bpm. Exam Location:  Inpatient Procedure: 2D Echo, Cardiac Doppler  and Color Doppler Indications:    Cardiomyopathy-hypertrophic  History:        Patient has no prior history of Echocardiogram examinations.                 Risk Factors:Hypertension, Diabetes and Dyslipidemia.  Sonographer:    Karma Ganja Referring Phys: [id number] [facility] GOEL  Sonographer Comments: Image acquisition challenging due to respiratory motion. IMPRESSIONS  1. Left ventricular ejection fraction, by estimation, is 55 to 60%. The left ventricle has normal function. The left ventricle has no regional wall motion abnormalities.  Left ventricular diastolic parameters were normal.  2. Right ventricular systolic function is normal. The right ventricular size is normal. There is mildly elevated pulmonary artery systolic pressure.  3. The mitral valve is normal in structure. Trivial mitral valve regurgitation. No evidence of mitral stenosis.  4. The aortic valve is tricuspid. Aortic valve regurgitation is trivial. No aortic stenosis is present.  5. The inferior vena cava is normal in size with greater than 50% respiratory variability, suggesting right atrial pressure of 3 mmHg. Comparison(s): No prior Echocardiogram. FINDINGS  Left Ventricle: Left ventricular ejection fraction, by estimation, is 55 to 60%. The left ventricle has normal function. The left ventricle has no regional wall motion abnormalities. The left ventricular internal cavity size was normal in size. There is  no left ventricular hypertrophy. Left ventricular diastolic parameters were normal. Right Ventricle: The right ventricular size is normal. Right ventricular systolic function is normal. There is mildly elevated pulmonary artery systolic pressure. The tricuspid regurgitant velocity is 2.81 m/s, and with an assumed right atrial pressure of 8 mmHg, the estimated right ventricular systolic pressure is 39.6 mmHg. Left Atrium: Left atrial size was normal in size. Right Atrium: Right atrial size was normal in size. Pericardium: Trivial pericardial  effusion is present. Mitral Valve: The mitral valve is normal in structure. Trivial mitral valve regurgitation. No evidence of mitral valve stenosis. Tricuspid Valve: The tricuspid valve is normal in structure. Tricuspid valve regurgitation is trivial. No evidence of tricuspid stenosis. Aortic Valve: The aortic valve is tricuspid. Aortic valve regurgitation is trivial. Aortic regurgitation PHT measures 707 msec. No aortic stenosis is present. Aortic valve mean gradient measures 3.0 mmHg. Aortic valve peak gradient measures 7.6 mmHg. Aortic valve area, by VTI measures 2.25 cm. Pulmonic Valve: The pulmonic valve was normal in structure. Pulmonic valve regurgitation is not visualized. No evidence of pulmonic stenosis. Aorta: The aortic root is normal in size and structure. Venous: The inferior vena cava is normal in size with greater than 50% respiratory variability, suggesting right atrial pressure of 3 mmHg. IAS/Shunts: No atrial level shunt detected by color flow Doppler.  LEFT VENTRICLE PLAX 2D LVIDd:         4.20 cm   Diastology LVIDs:         2.00 cm   LV e' medial:    9.36 cm/s LV PW:         0.90 cm   LV E/e' medial:  10.3 LV IVS:        1.00 cm   LV e' lateral:   12.80 cm/s LVOT diam:     2.00 cm   LV E/e' lateral: 7.5 LV SV:         67 LV SV Index:   44 LVOT Area:     3.14 cm  RIGHT VENTRICLE             IVC RV Basal diam:  3.30 cm     IVC diam: 2.10 cm RV S prime:     11.50 cm/s TAPSE (M-mode): 2.7 cm LEFT ATRIUM             Index        RIGHT ATRIUM           Index LA diam:        3.20 cm 2.12 cm/m   RA Area:     13.30 cm LA Vol (A2C):   54.3 ml 35.98 ml/m  RA Volume:   29.00 ml  19.22 ml/m LA Vol (  A4C):   37.9 ml 25.12 ml/m LA Biplane Vol: 48.4 ml 32.07 ml/m  AORTIC VALVE AV Area (Vmax):    1.81 cm AV Area (Vmean):   2.08 cm AV Area (VTI):     2.25 cm AV Vmax:           138.00 cm/s AV Vmean:          83.700 cm/s AV VTI:            0.296 m AV Peak Grad:      7.6 mmHg AV Mean Grad:      3.0 mmHg  LVOT Vmax:         79.60 cm/s LVOT Vmean:        55.500 cm/s LVOT VTI:          0.212 m LVOT/AV VTI ratio: 0.72 AI PHT:            707 msec  AORTA Ao Root diam: 2.90 cm Ao Asc diam:  3.10 cm MITRAL VALVE               TRICUSPID VALVE MV Area (PHT): 4.06 cm    TR Peak grad:   31.6 mmHg MV Decel Time: 187 msec    TR Vmax:        281.00 cm/s MV E velocity: 96.00 cm/s MV A velocity: 73.30 cm/s  SHUNTS MV E/A ratio:  1.31        Systemic VTI:  0.21 m                            Systemic Diam: 2.00 cm Olga Millers MD Electronically signed by Olga Millers MD Signature Date/Time: 09/04/2023/3:40:40 PM    Final    VAS [place] LOWER EXTREMITY VENOUS (DVT) Result Date: 09/03/2023  Lower Venous DVT Study Patient Name:  Wayne Stokes    Date of Exam:   09/03/2023 Medical Rec #: [id number]  Accession #:    [id number] Date of Birth: 1965/11/06   Patient Gender: M Patient Age:   54 years Exam Location:  [facility] Procedure:      VAS [place] LOWER EXTREMITY VENOUS (DVT) Referring Phys: Edward Plainfield GOEL --------------------------------------------------------------------------------  Indications: Swelling, and Edema.  Comparison Study: No prior exam. Performing Technologist: Fernande Bras  Examination Guidelines: A complete evaluation includes B-mode imaging, spectral Doppler, color Doppler, and power Doppler as needed of all accessible portions of each vessel. Bilateral testing is considered an integral part of a complete examination. Limited examinations for reoccurring indications may be performed as noted. The reflux portion of the exam is performed with the patient in reverse Trendelenburg.  +---------+---------------+---------+-----------+----------+--------------+ RIGHT    CompressibilityPhasicitySpontaneityPropertiesThrombus Aging +---------+---------------+---------+-----------+----------+--------------+ CFV      Full           Yes      Yes                                  +---------+---------------+---------+-----------+----------+--------------+ SFJ      Full           Yes      Yes                                 +---------+---------------+---------+-----------+----------+--------------+ FV Prox  Full                                                        +---------+---------------+---------+-----------+----------+--------------+  FV Mid   Full                                                        +---------+---------------+---------+-----------+----------+--------------+ FV DistalFull                                                        +---------+---------------+---------+-----------+----------+--------------+ PFV      Full                                                        +---------+---------------+---------+-----------+----------+--------------+ POP      Full           Yes      Yes                                 +---------+---------------+---------+-----------+----------+--------------+ PTV      Full                                                        +---------+---------------+---------+-----------+----------+--------------+ PERO     Full                                                        +---------+---------------+---------+-----------+----------+--------------+   +---------+---------------+---------+-----------+----------+--------------+ LEFT     CompressibilityPhasicitySpontaneityPropertiesThrombus Aging +---------+---------------+---------+-----------+----------+--------------+ CFV      Full           Yes      Yes                                 +---------+---------------+---------+-----------+----------+--------------+ SFJ      Full           Yes      Yes                                 +---------+---------------+---------+-----------+----------+--------------+ FV Prox  Full                                                         +---------+---------------+---------+-----------+----------+--------------+ FV Mid   Full                                                        +---------+---------------+---------+-----------+----------+--------------+  FV DistalFull                                                        +---------+---------------+---------+-----------+----------+--------------+ PFV      Full                                                        +---------+---------------+---------+-----------+----------+--------------+ POP      Full           Yes      Yes                                 +---------+---------------+---------+-----------+----------+--------------+ PTV      Full                                                        +---------+---------------+---------+-----------+----------+--------------+ PERO     Full                                                        +---------+---------------+---------+-----------+----------+--------------+     Summary: BILATERAL: - No evidence of deep vein thrombosis seen in the lower extremities, bilaterally. -No evidence of popliteal cyst, bilaterally.   *See table(s) above for measurements and observations. Electronically signed by Coral Else MD on 09/03/2023 at 8:29:46 PM.    Final    DG FEMUR MIN 2 VIEWS LEFT Result Date: 09/03/2023 CLINICAL DATA:  Left hip pain.  No known injury. EXAM: LEFT FEMUR 2 VIEWS COMPARISON:  None Available. FINDINGS: There is no evidence of fracture or other focal bone lesions. Partially imaged degenerative changes of the knee. Large suprapatellar joint effusion. Soft tissues are unremarkable. IMPRESSION: 1. No acute fracture or dislocation. 2. Partially imaged degenerative changes of the knee with large suprapatellar joint effusion. Electronically Signed   By: Agustin Cree M.D.   On: 09/03/2023 19:07   CT ABDOMEN PELVIS W CONTRAST Result Date: 09/03/2023 CLINICAL DATA:  Abdominal pain, acute, nonlocalized  EXAM: CT ABDOMEN AND PELVIS WITH CONTRAST TECHNIQUE: Multidetector CT imaging of the abdomen and pelvis was performed using the standard protocol following bolus administration of intravenous contrast. RADIATION DOSE REDUCTION: This exam was performed according to the departmental dose-optimization program which includes automated exposure control, adjustment of the mA and/or kV according to patient size and/or use of iterative reconstruction technique. CONTRAST:  OMNIPAQUE IOHEXOL 300 MG/ML  SOLN COMPARISON:  CT scan abdomen and pelvis from 06/17/2022. FINDINGS: Lower chest: The lung bases are clear. No pleural effusion. The heart is normal in size. No pericardial effusion. Hepatobiliary: The liver is normal in size. Non-cirrhotic configuration. No suspicious mass. These is mild diffuse hepatic steatosis. No intrahepatic or extrahepatic bile duct dilation. No calcified gallstones. Normal gallbladder wall thickness. No pericholecystic inflammatory changes. Pancreas: Unremarkable.  No pancreatic ductal dilatation or surrounding inflammatory changes. Spleen: Within normal limits. There is a 5 mm hypoattenuating focus in the inferior portion of the spleen, which is too small to adequately characterize. Adrenals/Urinary Tract: Adrenal glands are unremarkable. No suspicious renal mass seen. 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 Bilateral kidneys exhibit extrarenal pelvis. However, no hydronephrosis or hydroureter. Urinary bladder is partially distended despite Foley catheter. There is mild circumferential wall thickening and mucosal hyperattenuation, which can be seen with cystitis. Correlate clinically and with urinalysis. No focal bladder mass, calculi or perivesical fat stranding. Stomach/Bowel: No disproportionate dilation of the small or large bowel loops. No evidence of abnormal bowel wall thickening or inflammatory changes. The appendix is unremarkable. Note is made of moderate stool  burden. Vascular/Lymphatic: No ascites or pneumoperitoneum. No abdominal or pelvic lymphadenopathy, by size criteria. No aneurysmal dilation of the major abdominal arteries. There are mild peripheral atherosclerotic vascular calcifications of the aorta and its major branches. Reproductive: Normal size prostate. Symmetric seminal vesicles. Other: The visualized soft tissues and abdominal wall are unremarkable. Musculoskeletal: No suspicious osseous lesions. There are mild - moderate multilevel degenerative changes in the visualized spine. 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 IMPRESSION: *There is mild circumferential wall thickening and mucosal hyperattenuation of the urinary bladder, which can be seen with cystitis. Correlate clinically and with urinalysis. *No other acute inflammatory process identified within the abdomen or pelvis. *Multiple other nonacute observations, as described above. Electronically Signed   By: Jules Schick M.D.   On: 09/03/2023 16:01   DG Chest Port 1 View Result Date: 09/03/2023 CLINICAL DATA:  Questionable sepsis - evaluate for abnormality. Headache, abdominal and back pain. EXAM: PORTABLE CHEST 1 VIEW COMPARISON:  06/25/2014. FINDINGS: Bilateral lung fields are clear. Bilateral costophrenic angles are clear. Stable cardio-mediastinal silhouette. No acute osseous abnormalities. The soft tissues are within normal limits. IMPRESSION: No active disease. Electronically Signed   By: Jules Schick M.D.   On: 09/03/2023 13:25       The results of significant diagnostics from this hospitalization (including imaging, microbiology, ancillary and laboratory) are listed below for reference.     Microbiology: Recent Results (from the past 240  hours)  Blood Culture (routine x 2)     Status: None (Preliminary result)   Collection Time: 09/03/23 11:20 AM   Specimen: BLOOD  Result Value Ref Range Status   Specimen Description   Final    BLOOD LEFT ANTECUBITAL Performed at [facility], [address] [place], [place] [address]    Special Requests   Final    BOTTLES DRAWN AEROBIC AND ANAEROBIC Blood Culture results may not be optimal due to an inadequate volume of blood received in culture bottles Performed at [facility], [address], [place], [place] [address]    Culture   Final    NO GROWTH 3 DAYS Performed at [facility], [address] [place], [place] [address]    Report Status PENDING  Incomplete  Blood Culture (routine x 2)     Status: None (Preliminary result)   Collection Time: 09/03/23 11:47 AM   Specimen: BLOOD RIGHT FOREARM  Result Value Ref Range Status   Specimen Description   Final    BLOOD RIGHT FOREARM Performed at [facility] [facility]  [facility], [address] [place], [place] [address]    Special Requests   Final    BOTTLES DRAWN AEROBIC AND ANAEROBIC Blood Culture results may not be optimal due to an inadequate volume of blood received in culture bottles Performed at [facility], [address], [place], [place] [address]    Culture   Final    NO GROWTH 3 DAYS Performed at [facility], [address] [place], [place] [address]    Report Status PENDING  Incomplete  Urine Culture     Status: Abnormal   Collection Time: 09/03/23  5:14 PM   Specimen: Urine, Random  Result Value Ref Range Status   Specimen Description   Final    URINE, RANDOM Performed at [facility], [address] [place], [place] [address]    Special Requests   Final    NONE Reflexed from [id number] Performed at [facility], [address], [place], [place] [address]    Culture (A)  Final    <10,000  COLONIES/mL INSIGNIFICANT GROWTH Performed at [facility], [address] [place], [place] [address]    Report Status 09/04/2023 FINAL  Final  MRSA Next Gen by PCR, Nasal     Status: None   Collection Time: 09/06/23  5:55 PM   Specimen: Nasal Mucosa; Nasal Swab  Result Value Ref Range Status   MRSA by PCR Next Gen NOT DETECTED NOT DETECTED Final    Comment: (NOTE) The GeneXpert MRSA Assay (FDA approved for NASAL specimens only), is one component of a comprehensive MRSA colonization surveillance program. It is not intended to diagnose MRSA infection nor to guide or monitor treatment for MRSA infections. Test performance is not FDA approved in patients less than 52 years old. Performed at [facility], [address] [place], [place] [address]      Labs:  CBC: Recent Labs  Lab 09/03/23 1120 09/03/23 1927 09/04/23 0822 09/05/23 0408 09/06/23 0358  WBC 13.8* 7.8 7.0 6.8 9.3  NEUTROABS 8.9*  --   --   --   --   HGB 9.4* 7.5* 7.6* 8.3* 8.7*  HCT 30.1* 24.8* 23.8* 26.8* 28.5*  MCV 73.8* 74.0* 72.8* 73.0* 73.6*  PLT 464* 369 351 403* 390   BMP &GFR Recent Labs  Lab 09/03/23 1120 09/03/23 1927 09/04/23 0822 09/05/23 0408 09/06/23 0358  NA 132*  --  138 139 135  K 4.3  --  3.6 3.4* 3.9  CL 97*  --  107 106 104  CO2 25  --  24 25 24   GLUCOSE 135*  --  90 82 120*  BUN 13  --  10 7 8   CREATININE 0.61 0.43* 0.58* 0.61 0.51*  CALCIUM 8.6*  --  8.3* 8.5* 8.1*  MG  --   --   --  1.6* 2.0  PHOS  --   --   --  2.9 3.0   Estimated Creatinine Clearance: 71.2 mL/min (A) (by C-G formula based on SCr of 0.51 mg/dL (L)). Liver & Pancreas: Recent Labs  Lab 09/03/23 1120 09/05/23 0408 09/06/23 0358  AST 17  --   --   ALT <5  --   --   ALKPHOS 68  --   --   BILITOT 0.4  --   --   PROT 7.8  --   --   ALBUMIN 2.1* 1.9* 2.1*   Recent Labs  Lab 09/03/23 1120  LIPASE 27   No results for input(s): "AMMONIA" in the last 168 hours. Diabetic: No  results for input(s): "HGBA1C" in the last 72 hours. Recent Labs  Lab 09/05/23 1120 09/05/23 1631 09/05/23 2034 09/06/23 0730 09/06/23 1143  GLUCAP 93 91 204* 93 106*   Cardiac Enzymes: No results for input(s): "CKTOTAL", "CKMB", "CKMBINDEX", "TROPONINI" in the last 168 hours. No results for input(s): "PROBNP" in the last 8760 hours. Coagulation Profile: Recent Labs  Lab 09/03/23 1120 09/04/23 0822  INR 1.1 1.1   Thyroid Function Tests: No results for input(s): "TSH", "T4TOTAL", "FREET4", "T3FREE", "THYROIDAB" in the last 72 hours. Lipid Profile: No results for input(s): "CHOL", "HDL", "LDLCALC", "TRIG", "CHOLHDL", "LDLDIRECT" in the last 72 hours. Anemia Panel: Recent Labs    09/04/23 1231 09/04/23 1302  VITAMINB12  --  639  FOLATE  --  20.0  FERRITIN  --  118  TIBC  --  139*  IRON  --  36*  RETICCTPCT 1.3  --    Urine analysis:    Component Value Date/Time   COLORURINE STRAW (A) 09/03/2023 1714   APPEARANCEUR CLEAR 09/03/2023 1714   LABSPEC 1.029 09/03/2023 1714   PHURINE 6.0 09/03/2023 1714   GLUCOSEU NEGATIVE 09/03/2023 1714   HGBUR NEGATIVE 09/03/2023 1714   BILIRUBINUR NEGATIVE 09/03/2023 1714   BILIRUBINUR small (A) 11/15/2021 1225   KETONESUR NEGATIVE 09/03/2023 1714   PROTEINUR NEGATIVE 09/03/2023 1714   UROBILINOGEN 0.2 11/15/2021 1225   NITRITE NEGATIVE 09/03/2023 1714   LEUKOCYTESUR MODERATE (A) 09/03/2023 1714   Sepsis Labs: Invalid input(s): "PROCALCITONIN", "LACTICIDVEN"   SIGNED:  Almon Hercules, MD  [facility] 09/07/2023, 11:18 AM

## 2023-09-07 NOTE — TOC Progression Note (Signed)
Transition of Care [facility]) - Progression Note    Patient Details  Name: Wayne Stokes MRN: [id number] Date of Birth: 08-05-1966  [facility] CM/SW Contact  Halford Chessman Phone Number: 09/07/2023, 8:42 AM  Clinical Narrative:     CSW received message back from [place] [facility] [place], she said patient can return once medically ready for discharge.  He is LTC at [facility].        Expected Discharge Plan and Services         Expected Discharge Date: 09/07/23                                     Social Determinants of Health (SDOH) Interventions SDOH Screenings   Food Insecurity: Food Insecurity Present (05/27/2022)  Housing: Patient Unable To Answer (09/04/2023)  Transportation Needs: Patient Unable To Answer (09/04/2023)  Utilities: Patient Unable To Answer (09/04/2023)  Depression (PHQ2-9): High Risk (09/03/2023)  Tobacco Use: Low Risk  (09/03/2023)    Readmission Risk Interventions     No data to display

## 2023-09-08 LAB — CULTURE, BLOOD (ROUTINE X 2)
Culture: NO GROWTH
Culture: NO GROWTH

## 2023-09-24 IMAGING — MR MR LUMBAR SPINE W/O CM
4 of 5 series · 27 of 48 positions shown · non-contrast
Comparison: Radiographs December 23, 2021.

CLINICAL DATA: Acute low back pain, unspecified back pain
laterality, unspecified whether sciatica present [id number].UY (V7G-[id number]-CM).
Low back pain, symptoms persist with > 6 wks treatment. Radicular
pain of right lower extremity 2SR.VN (V7G-[id number]-CM).

EXAM:
MRI LUMBAR SPINE WITHOUT CONTRAST
TECHNIQUE: Multiplanar, multisequence MR imaging of the lumbar spine was
performed. No intravenous contrast was administered.

[Series 2: T2 · sagittal · 4.0mm · 1.09mm/px · 7 of 17 slices shown (1 of 2)]
[im 1/17]
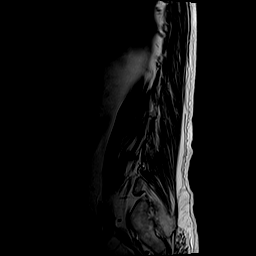
[im 3/17]
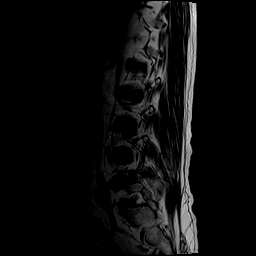
[im 6/17]
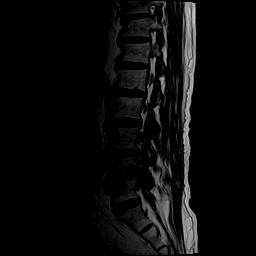
[im 9/17]
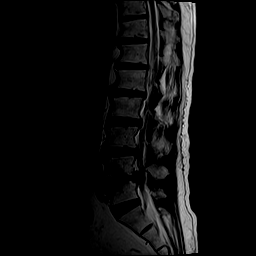
[im 11/17]
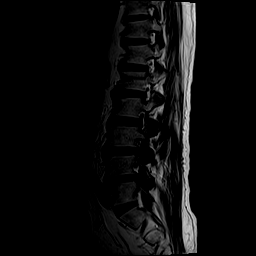
[im 14/17]
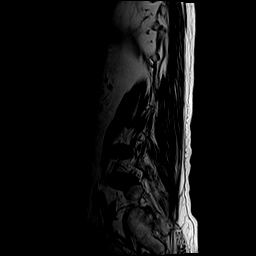
[im 17/17]
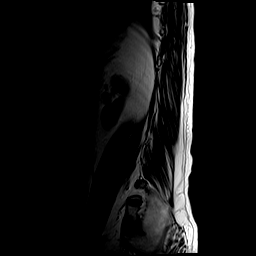

[Series 4: T1 · sagittal · 4.0mm · 1.09mm/px · 6 of 17 slices shown (1 of 2)]
[im 1/17]
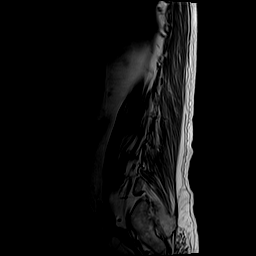
[im 4/17]
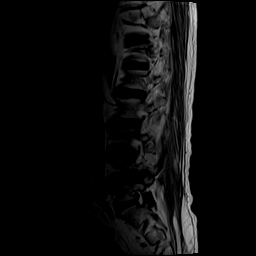
[im 7/17]
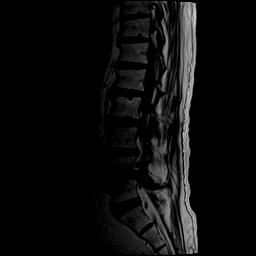
[im 10/17]
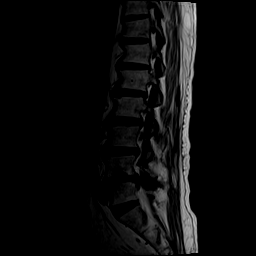
[im 13/17]
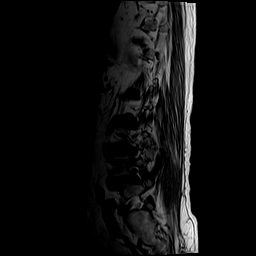
[im 17/17]
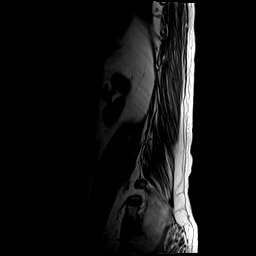

[Series 5: T2 · axial · 4.0mm · 0.39mm/px · z∈[-69,+122]mm · 8 of 38 slices shown (2 of 2)]
[im 1/38]
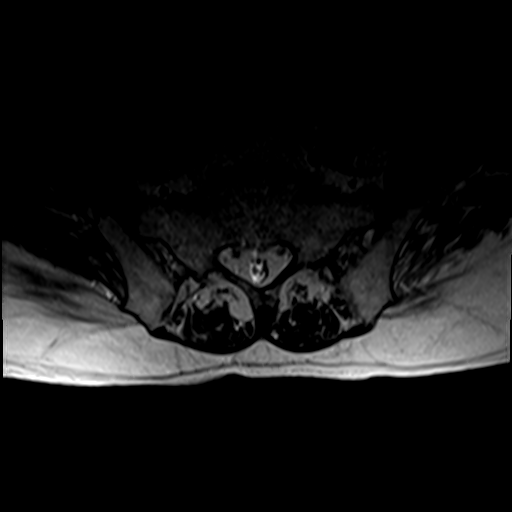
[im 6/38]
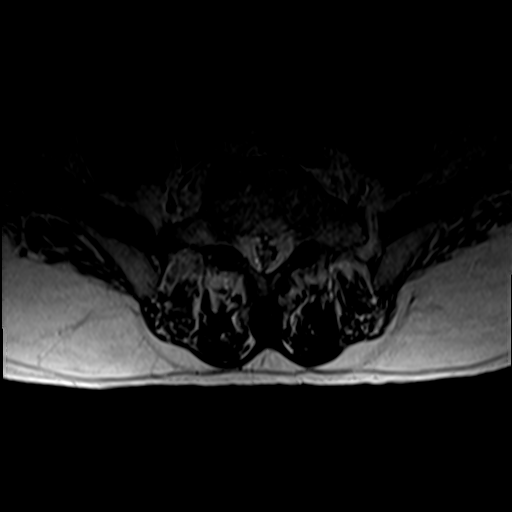
[im 12/38]
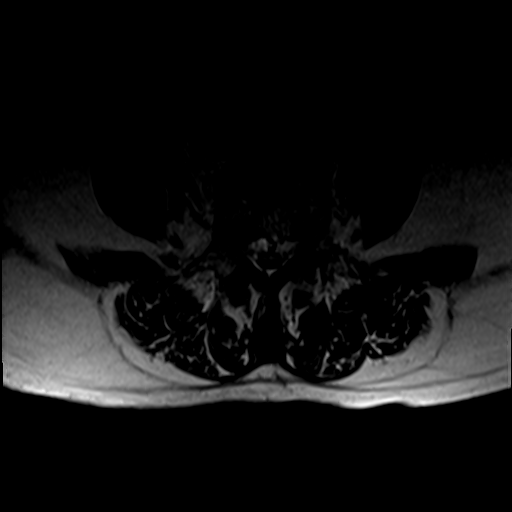
[im 18/38]
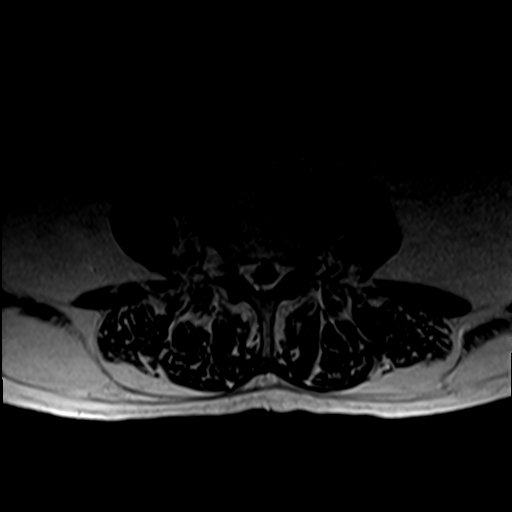
[im 20/38]
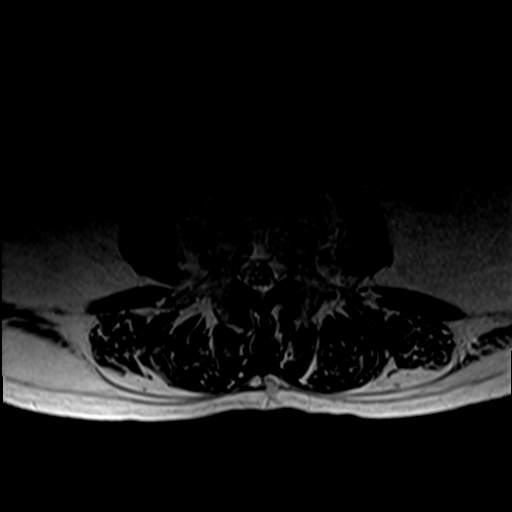
[im 26/38]
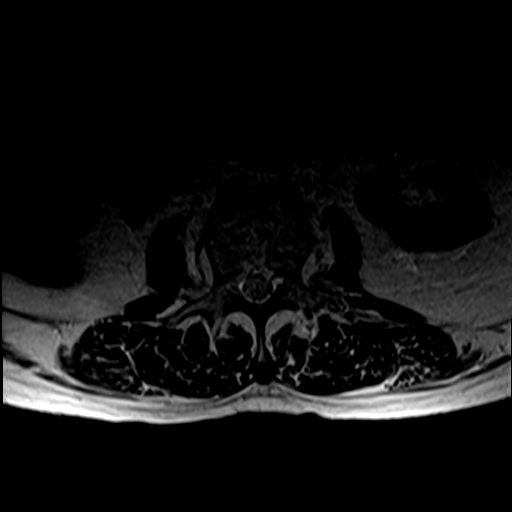
[im 32/38]
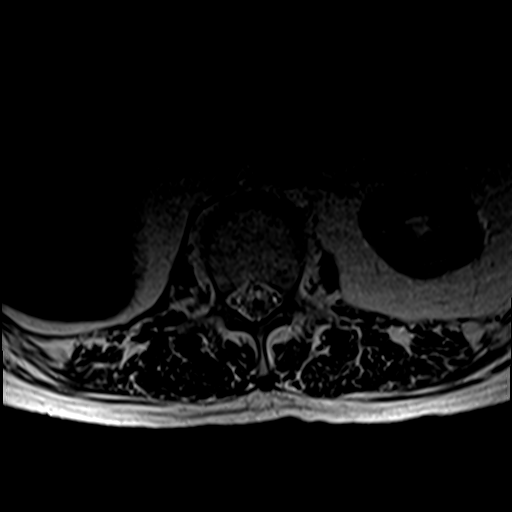
[im 38/38]
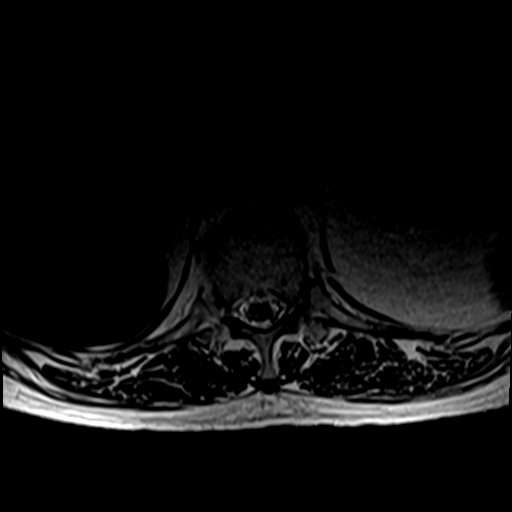

[Series 6: T1 · axial · 4.0mm · 0.39mm/px · z∈[-69,+93]mm · 6 of 38 slices shown (2 of 2)]
[im 1/38]
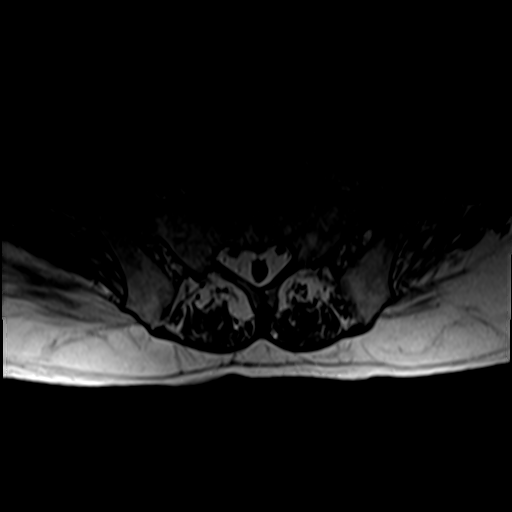
[im 6/38]
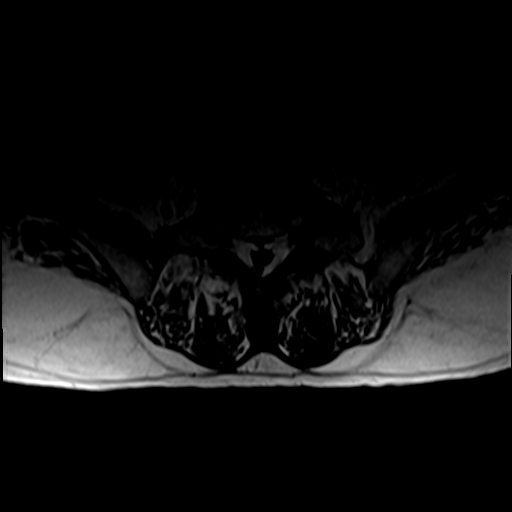
[im 12/38]
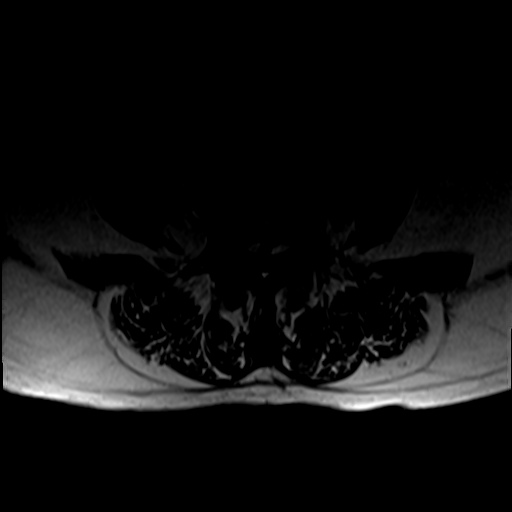
[im 18/38]
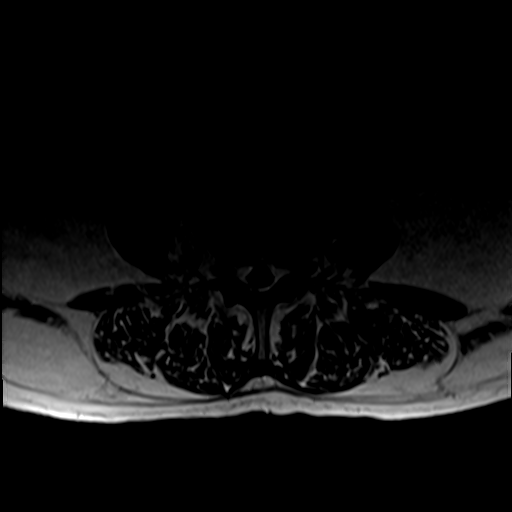
[im 20/38]
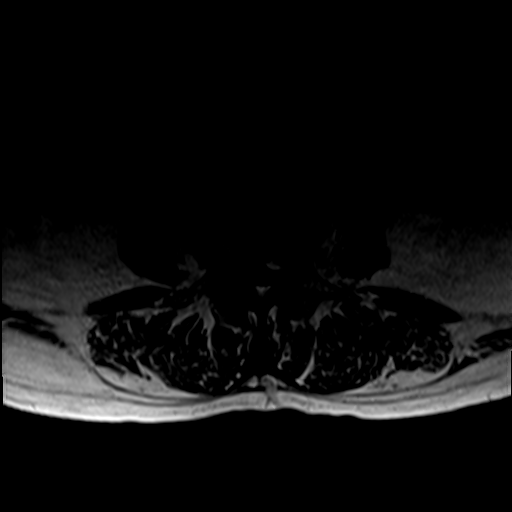
[im 32/38]
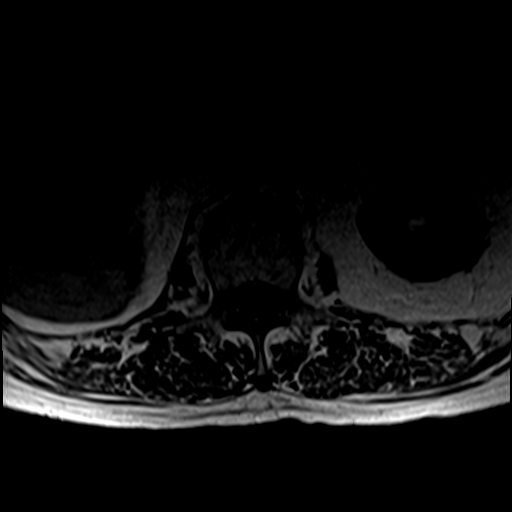

[27 of 48 positions shown; findings below may reference images not displayed]

FINDINGS: Segmentation: A transitional lumbosacral vertebra is assumed to
represent a partially sacralized L5 level. Careful correlation with
this numbering strategy prior to any procedural intervention would
be recommended. d.

Alignment:  Grade 1 anterolisthesis of L4 over L5.

Vertebrae: No fracture, evidence of discitis, or bone lesion.
Endplate degenerative changes at L4-5. Congenitally small spinal
canal.

Conus medullaris and cauda equina: Conus extends to the L1-2 level.
Conus and cauda equina appear normal.

Paraspinal and other soft tissues: Negative.

Disc levels:

T12-L1: No significant spinal canal or neural foraminal stenosis.

L1-2: Disc bulge and mild facet degenerative changes with associated
epidural lipomatosis resulting in mild spinal canal stenosis and
mild left neural foraminal narrowing.

L2-3: Disc bulge, mild to moderate facet degenerative changes and
epidural lipomatosis resulting in moderate spinal canal stenosis and
mild left neural foraminal narrowing.

L3-4: Disc bulge, mild-to-moderate facet degenerative changes and
epidural lipomatosis resulting in moderate spinal canal stenosis,
mild right and moderate left neural neural.

L4-5: Anterolisthesis, disc bulge with superimposed right foraminal
disc protrusion and advanced facet degenerative changes with
ligamentum flavum redundancy resulting in severe spinal canal
stenosis, severe right and moderate left neural foraminal narrowing.

L5-S1: No spinal canal or neural foraminal stenosis.
IMPRESSION: 1. Transitional lumbosacral anatomy with partially sacralized L5.
2. Degenerative changes of the lumbar spine superimposed on a
congenitally small spinal canal with associated epidural lipomatosis
resulting in severe spinal canal stenosis at L4-5 and moderate at
L2-3 and L3-4.
3. Severe right and moderate left neural foraminal narrowing at
L4-5.

## 2023-11-26 ENCOUNTER — Telehealth: Payer: Self-pay | Admitting: Nurse Practitioner

## 2023-11-26 NOTE — Telephone Encounter (Signed)
 Copied from CRM [id number]. Topic: General - Other >> Nov 26, 2023  3:28 PM Franchot Heidelberg wrote: Reason for CRM: [place] [phone] Community health worker   Wants to know who the patient's social worker is, please advise

## 2024-03-04 ENCOUNTER — Encounter (HOSPITAL_COMMUNITY): Payer: Self-pay | Admitting: Interventional Radiology
